# Patient Record
Sex: Female | Born: 1950 | Race: Black or African American | Hispanic: No | Marital: Single | Smoking: Former smoker
Health system: Southern US, Community
[De-identification: ages and names within clinical notes are randomized; demographics above are authoritative.]

## PROBLEM LIST (undated history)

## (undated) DIAGNOSIS — M1990 Unspecified osteoarthritis, unspecified site: Secondary | ICD-10-CM

## (undated) DIAGNOSIS — J449 Chronic obstructive pulmonary disease, unspecified: Secondary | ICD-10-CM

## (undated) DIAGNOSIS — Z5189 Encounter for other specified aftercare: Secondary | ICD-10-CM

## (undated) DIAGNOSIS — D649 Anemia, unspecified: Secondary | ICD-10-CM

## (undated) DIAGNOSIS — IMO0001 Reserved for inherently not codable concepts without codable children: Secondary | ICD-10-CM

## (undated) DIAGNOSIS — C55 Malignant neoplasm of uterus, part unspecified: Secondary | ICD-10-CM

## (undated) DIAGNOSIS — IMO0002 Reserved for concepts with insufficient information to code with codable children: Secondary | ICD-10-CM

## (undated) DIAGNOSIS — K5900 Constipation, unspecified: Secondary | ICD-10-CM

## (undated) HISTORY — DX: Malignant neoplasm of uterus, part unspecified: C55

## (undated) HISTORY — DX: Encounter for other specified aftercare: Z51.89

## (undated) HISTORY — DX: Unspecified osteoarthritis, unspecified site: M19.90

## (undated) HISTORY — DX: Chronic obstructive pulmonary disease, unspecified: J44.9

## (undated) HISTORY — PX: COLONOSCOPY: SHX174

---
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
## 2003-02-20 ENCOUNTER — Emergency Department (HOSPITAL_COMMUNITY): Admission: EM | Admit: 2003-02-20 | Discharge: 2003-02-20 | Payer: Self-pay | Admitting: Emergency Medicine

## 2003-11-23 ENCOUNTER — Emergency Department (HOSPITAL_COMMUNITY): Admission: EM | Admit: 2003-11-23 | Discharge: 2003-11-23 | Payer: Self-pay | Admitting: Emergency Medicine

## 2007-03-19 ENCOUNTER — Emergency Department (HOSPITAL_COMMUNITY): Admission: EM | Admit: 2007-03-19 | Discharge: 2007-03-19 | Payer: Self-pay | Admitting: *Deleted

## 2007-03-25 ENCOUNTER — Encounter: Admission: RE | Admit: 2007-03-25 | Discharge: 2007-03-25 | Payer: Self-pay | Admitting: Internal Medicine

## 2009-01-20 ENCOUNTER — Encounter: Admission: RE | Admit: 2009-01-20 | Discharge: 2009-01-20 | Payer: Self-pay | Admitting: Internal Medicine

## 2009-03-31 ENCOUNTER — Encounter: Admission: RE | Admit: 2009-03-31 | Discharge: 2009-03-31 | Payer: Self-pay | Admitting: Internal Medicine

## 2010-07-08 ENCOUNTER — Encounter: Payer: Self-pay | Admitting: Internal Medicine

## 2011-03-28 LAB — URINALYSIS, ROUTINE W REFLEX MICROSCOPIC
Glucose, UA: NEGATIVE
Protein, ur: 30 — AB
pH: 6

## 2011-03-28 LAB — URINE MICROSCOPIC-ADD ON

## 2011-03-28 LAB — WET PREP, GENITAL

## 2011-03-28 LAB — GC/CHLAMYDIA PROBE AMP, GENITAL: Chlamydia, DNA Probe: NEGATIVE

## 2012-02-19 ENCOUNTER — Ambulatory Visit: Payer: Self-pay | Admitting: Emergency Medicine

## 2012-02-19 ENCOUNTER — Ambulatory Visit: Payer: Self-pay

## 2012-02-19 VITALS — BP 100/72 | HR 65 | Temp 98.3°F | Resp 18 | Ht 66.5 in | Wt 135.6 lb

## 2012-02-19 DIAGNOSIS — R05 Cough: Secondary | ICD-10-CM

## 2012-02-19 DIAGNOSIS — R509 Fever, unspecified: Secondary | ICD-10-CM

## 2012-02-19 DIAGNOSIS — J449 Chronic obstructive pulmonary disease, unspecified: Secondary | ICD-10-CM | POA: Insufficient documentation

## 2012-02-19 DIAGNOSIS — D7589 Other specified diseases of blood and blood-forming organs: Secondary | ICD-10-CM

## 2012-02-19 LAB — POCT CBC
Granulocyte percent: 41.7 %G (ref 37–80)
Lymph, poc: 1.8 (ref 0.6–3.4)
MCH, POC: 32 pg — AB (ref 27–31.2)
MCHC: 30.9 g/dL — AB (ref 31.8–35.4)
MID (cbc): 0.4 (ref 0–0.9)
RBC: 4.31 M/uL (ref 4.04–5.48)
RDW, POC: 12.9 %

## 2012-02-19 MED ORDER — DOXYCYCLINE HYCLATE 100 MG PO CAPS: 100.0000 mg | ORAL_CAPSULE | Freq: Two times a day (BID) | ORAL | Status: AC

## 2012-02-19 MED ORDER — BENZONATATE 100 MG PO CAPS: 100.0000 mg | ORAL_CAPSULE | Freq: Three times a day (TID) | ORAL | Status: AC | PRN

## 2012-02-19 MED ORDER — ALBUTEROL SULFATE HFA 108 (90 BASE) MCG/ACT IN AERS: 2.0000 | INHALATION_SPRAY | RESPIRATORY_TRACT | Status: DC | PRN

## 2012-02-19 NOTE — Progress Notes (Signed)
Subjective:    Patient ID: Sheena Caldwell, female    DOB: [birth year], 61 y.o.   MRN: [id number]  HPI This 61 y.o. female presents for evaluation of illness x 4 days.  Began with chest pain with bending.  Applied a linament with relief.  Now has a "hacking cough," fatigue, subjective fever, and feeling weak.  Cough is occasionally productive of a small amount of clear sputum.  Some morning nasal congestion, no sore throat.  No GI symptoms, GU symptoms. She is a smoker.    Review of Systems As above.   History reviewed. No pertinent past medical history.  Past Surgical History  Procedure Date  . Cesarean section   . Incise and drain abcess 1970    scalp    Prior to Admission medications   Not on File    No Known Allergies  History   Social History  . Marital Status: Single    Spouse Name: n/a    Number of Children: 2  . Years of Education: 13   Occupational History  . customer service representative     [employer]   Social History Main Topics  . Smoking status: Current Everyday Smoker -- 1.0 packs/day    Types: Cigarettes  . Smokeless tobacco: Never Used  . Alcohol Use: Yes     occasional wine  . Drug Use: No  . Sexually Active: Not Currently -- Female partner(s)    Birth Control/ Protection: Post-menopausal   Other Topics Concern  . Not on file   Social History Narrative   Daughter graduated from college 2013, plans to go to Social worker school, lives in [place].  Son lives in [place].    Family History  Problem Relation Age of Onset  . Hypertension Mother   . Cancer Father        Objective:   Physical Exam  Blood pressure 100/72, pulse 65, temperature 98.3 F (36.8 C), temperature source Oral, resp. rate 18, height 5' 6.5" (1.689 m), weight 135 lb 9.6 oz (61.508 kg), SpO2 98.00%. Body mass index is 21.56 kg/(m^2). Well-developed, well nourished BF who is awake, alert and oriented, in NAD. HEENT: [place]/AT, PERRL, EOMI.  Sclera and conjunctiva are  clear.  EAC are patent, TMs are normal in appearance. Nasal mucosa is pink and moist. OP is clear. Upper edentula is compensated.  She has a few remaining lower teeth. Neck: supple, non-tender, no lymphadenopathy, thyromegaly. Heart: RRR, no murmur Lungs: normal effort, CTA Skin: warm and dry without rash.  CXR: [facility] reading (PRIMARY) by  Dr. Cleta Alberts. Changes consistent with COPD, old granulomatous disease.  No pneumothorax.  No infiltrates.  No masses.  Results for orders placed in visit on 02/19/12  POCT CBC      Component Value Range   WBC 3.7 (*) 4.6 - 10.2 K/uL   Lymph, poc 1.8  0.6 - 3.4   POC LYMPH PERCENT 48.3  10 - 50 %L   MID (cbc) 0.4  0 - 0.9   POC MID % 10.0  0 - 12 %M   POC Granulocyte 1.5 (*) 2 - 6.9   Granulocyte percent 41.7  37 - 80 %G   RBC 4.31  4.04 - 5.48 M/uL   Hemoglobin 13.8  12.2 - 16.2 g/dL   HCT, POC 40.9  81.1 - 47.9 %   MCV 103.6 (*) 80 - 97 fL   MCH, POC 32.0 (*) 27 - 31.2 pg   MCHC 30.9 (*) 31.8 - 35.4 g/dL  RDW, POC 12.9     Platelet Count, POC 266  142 - 424 K/uL   MPV 7.6  0 - 99.8 fL       Assessment & Plan:   1. Cough  DG Chest 2 View, doxycycline (VIBRAMYCIN) 100 MG capsule, benzonatate (TESSALON) 100 MG capsule  2. Fever  POCT CBC  3. COPD (chronic obstructive pulmonary disease)  albuterol (PROVENTIL HFA;VENTOLIN HFA) 108 (90 BASE) MCG/ACT inhaler  4. Macrocytosis without anemia     Encouraged smoking cessation.

## 2012-02-19 NOTE — Patient Instructions (Signed)
Please consider quitting smoking.  Your lungs can improve if you quit, and you can stop the progression of COPD.

## 2014-04-06 HISTORY — PX: TOTAL ABDOMINAL HYSTERECTOMY W/ BILATERAL SALPINGOOPHORECTOMY: SHX83

## 2014-04-26 HISTORY — PX: PORTACATH PLACEMENT: SHX2246

## 2014-08-04 ENCOUNTER — Other Ambulatory Visit: Payer: Self-pay | Admitting: *Deleted

## 2014-08-04 DIAGNOSIS — C55 Malignant neoplasm of uterus, part unspecified: Secondary | ICD-10-CM

## 2014-08-05 ENCOUNTER — Ambulatory Visit: Payer: Medicaid Other | Attending: Gynecologic Oncology | Admitting: Gynecologic Oncology

## 2014-08-05 ENCOUNTER — Encounter: Payer: Self-pay | Admitting: Gynecologic Oncology

## 2014-08-05 VITALS — BP 99/72 | HR 90 | Temp 98.3°F | Resp 20 | Ht 66.5 in | Wt 130.4 lb

## 2014-08-05 DIAGNOSIS — Z90710 Acquired absence of both cervix and uterus: Secondary | ICD-10-CM | POA: Insufficient documentation

## 2014-08-05 DIAGNOSIS — C541 Malignant neoplasm of endometrium: Secondary | ICD-10-CM | POA: Diagnosis present

## 2014-08-05 NOTE — Patient Instructions (Signed)
Scheduling will call you with you appointment with Dr. Marko Plume

## 2014-08-08 ENCOUNTER — Other Ambulatory Visit: Payer: Self-pay | Admitting: Oncology

## 2014-08-08 ENCOUNTER — Telehealth: Payer: Self-pay | Admitting: Oncology

## 2014-08-08 ENCOUNTER — Telehealth: Payer: Self-pay | Admitting: *Deleted

## 2014-08-08 DIAGNOSIS — C541 Malignant neoplasm of endometrium: Secondary | ICD-10-CM

## 2014-08-08 NOTE — Telephone Encounter (Signed)
Call received from patient requesting Dr. Mariana Kaufman nurse.  Asked what I cold do to help but "I want to talk to [facility]".  Call transferred to collaborative.  Voicemail responed.

## 2014-08-08 NOTE — Telephone Encounter (Signed)
Pt is aware of np appt.. 08/09/14@9 :30

## 2014-08-09 ENCOUNTER — Other Ambulatory Visit (HOSPITAL_BASED_OUTPATIENT_CLINIC_OR_DEPARTMENT_OTHER): Payer: Medicaid Other

## 2014-08-09 ENCOUNTER — Encounter: Payer: Self-pay | Admitting: Oncology

## 2014-08-09 ENCOUNTER — Encounter: Payer: Self-pay | Admitting: Gynecologic Oncology

## 2014-08-09 ENCOUNTER — Telehealth: Payer: Self-pay

## 2014-08-09 ENCOUNTER — Ambulatory Visit: Payer: Medicaid Other

## 2014-08-09 ENCOUNTER — Ambulatory Visit (HOSPITAL_BASED_OUTPATIENT_CLINIC_OR_DEPARTMENT_OTHER): Payer: Medicaid Other | Admitting: Oncology

## 2014-08-09 ENCOUNTER — Telehealth: Payer: Self-pay | Admitting: Oncology

## 2014-08-09 VITALS — BP 92/62 | HR 94 | Temp 99.2°F | Resp 19 | Ht 66.0 in | Wt 138.6 lb

## 2014-08-09 DIAGNOSIS — C541 Malignant neoplasm of endometrium: Secondary | ICD-10-CM

## 2014-08-09 DIAGNOSIS — T451X5A Adverse effect of antineoplastic and immunosuppressive drugs, initial encounter: Secondary | ICD-10-CM

## 2014-08-09 DIAGNOSIS — H6692 Otitis media, unspecified, left ear: Secondary | ICD-10-CM

## 2014-08-09 DIAGNOSIS — C775 Secondary and unspecified malignant neoplasm of intrapelvic lymph nodes: Secondary | ICD-10-CM

## 2014-08-09 DIAGNOSIS — K209 Esophagitis, unspecified without bleeding: Secondary | ICD-10-CM | POA: Insufficient documentation

## 2014-08-09 DIAGNOSIS — D701 Agranulocytosis secondary to cancer chemotherapy: Secondary | ICD-10-CM | POA: Insufficient documentation

## 2014-08-09 DIAGNOSIS — R112 Nausea with vomiting, unspecified: Secondary | ICD-10-CM | POA: Insufficient documentation

## 2014-08-09 DIAGNOSIS — S025XXA Fracture of tooth (traumatic), initial encounter for closed fracture: Secondary | ICD-10-CM | POA: Insufficient documentation

## 2014-08-09 DIAGNOSIS — Z720 Tobacco use: Secondary | ICD-10-CM

## 2014-08-09 DIAGNOSIS — Z95828 Presence of other vascular implants and grafts: Secondary | ICD-10-CM | POA: Insufficient documentation

## 2014-08-09 DIAGNOSIS — K208 Other esophagitis: Secondary | ICD-10-CM

## 2014-08-09 DIAGNOSIS — K089 Disorder of teeth and supporting structures, unspecified: Secondary | ICD-10-CM

## 2014-08-09 LAB — COMPREHENSIVE METABOLIC PANEL (CC13)
ALBUMIN: 3.2 g/dL — AB (ref 3.5–5.0)
ALT: 48 U/L (ref 0–55)
AST: 43 U/L — AB (ref 5–34)
Alkaline Phosphatase: 152 U/L — ABNORMAL HIGH (ref 40–150)
Anion Gap: 7 mEq/L (ref 3–11)
BUN: 6.4 mg/dL — AB (ref 7.0–26.0)
CALCIUM: 9.3 mg/dL (ref 8.4–10.4)
CHLORIDE: 103 meq/L (ref 98–109)
CO2: 29 mEq/L (ref 22–29)
Creatinine: 0.8 mg/dL (ref 0.6–1.1)
Glucose: 162 mg/dl — ABNORMAL HIGH (ref 70–140)
POTASSIUM: 3.9 meq/L (ref 3.5–5.1)
SODIUM: 138 meq/L (ref 136–145)
TOTAL PROTEIN: 7.6 g/dL (ref 6.4–8.3)
Total Bilirubin: 0.27 mg/dL (ref 0.20–1.20)

## 2014-08-09 LAB — CBC WITH DIFFERENTIAL/PLATELET
BASO%: 0.1 % (ref 0.0–2.0)
Basophils Absolute: 0 10*3/uL (ref 0.0–0.1)
EOS%: 0.1 % (ref 0.0–7.0)
Eosinophils Absolute: 0 10*3/uL (ref 0.0–0.5)
HCT: 34.7 % — ABNORMAL LOW (ref 34.8–46.6)
HGB: 11.6 g/dL (ref 11.6–15.9)
LYMPH#: 1.3 10*3/uL (ref 0.9–3.3)
LYMPH%: 7.9 % — ABNORMAL LOW (ref 14.0–49.7)
MCH: 34.5 pg — ABNORMAL HIGH (ref 25.1–34.0)
MCHC: 33.4 g/dL (ref 31.5–36.0)
MCV: 103.3 fL — ABNORMAL HIGH (ref 79.5–101.0)
MONO#: 1.2 10*3/uL — AB (ref 0.1–0.9)
MONO%: 7.2 % (ref 0.0–14.0)
NEUT#: 13.6 10*3/uL — ABNORMAL HIGH (ref 1.5–6.5)
NEUT%: 84.7 % — ABNORMAL HIGH (ref 38.4–76.8)
Platelets: 139 10*3/uL — ABNORMAL LOW (ref 145–400)
RBC: 3.36 10*6/uL — AB (ref 3.70–5.45)
RDW: 15 % — ABNORMAL HIGH (ref 11.2–14.5)
WBC: 16 10*3/uL — AB (ref 3.9–10.3)

## 2014-08-09 MED ORDER — AZITHROMYCIN 250 MG PO TABS: ORAL_TABLET | ORAL | Status: DC

## 2014-08-09 NOTE — Telephone Encounter (Signed)
per pof to sch pt appt-gave pt copy of sch °

## 2014-08-09 NOTE — Progress Notes (Signed)
Consult Note: Gyn-Onc  Consult was requested by Dr. Jacquelyne Balint for the evaluation of Sheena Caldwell 64 y.o. female with stage IIIC1 serous endometrial cancer.  CC:  Chief Complaint  Patient presents with  . New Patient  . Endometrial Cancer    Assessment/Plan:  Sheena Caldwell  is a 64 y.o.  year old with stage IIIc 1 endometrial serous carcinoma. She has completed 3 of a planned 6 cycles of adjuvant carboplatin and paclitaxel. This has been complicated by grade 3 neutropenia which required dose delay and administration of Neulasta. She is on day 7 of cycle 3 today.  1/ referral to Dr. Marko Plume for resumption of an additional 3 cycles of carboplatin and paclitaxel with Neulasta prophylaxis 2/ baseline imaging to be performed after completion of a total of 6 cycles of carboplatin and paclitaxel (after 3 additional doses). 3/ consideration for adjuvant vaginal brachial therapy given her 9 cm serous endometrial tumor with LV SI present in the uterine specimen. This would be only beneficial for oral control at the vaginal cuff and would confer no survival benefit.   HPI: Sheena Caldwell is a 64 year old woman with a history of endometrial cancer. On 04/06/2014 she underwent a total abdominal hysterectomy BSO omentectomy lymph node dissection by Dr. Jacquelyne Balint in [place]. Final pathology revealed a uterine serous carcinoma which was 9 cm in greatest dimension, with Full myometrial invasion, positive L VSI, positive peritoneal cytology, positive pelvic lymph nodes (2 of 5 on the right, and 2 of 5 on the left. The omentum was negative for metastatic carcinoma.  Lymph nodes were not sampled. Following surgery she was dispositioned to receive 6 cycles of adjuvant chemotherapy with carboplatin AUC 6 and paclitaxel on paclitaxel 175 mg/m. She relocated to [facility] last week and desires to continue her current treatment regimen here with [place].  Overall she has been tolerating therapy fairly well with  the exception of developing severe neutropenia after cycle 2 which required dose delays for 2 weeks and had menstruation of Neulasta at cycle 3.  Today she is day 7 of cycle 3.   Current Meds:  Outpatient Encounter Prescriptions as of 08/05/2014  Medication Sig  . clonazePAM (KLONOPIN) 1 MG tablet Take 1 mg by mouth 2 (two) times daily as needed for anxiety (for restless legs).  Marland Kitchen HYDROcodone-acetaminophen (NORCO/VICODIN) 5-325 MG per tablet Take 1 tablet by mouth every 4 (four) hours as needed for moderate pain.  Marland Kitchen ondansetron (ZOFRAN) 8 MG tablet Take 8 mg by mouth every 8 (eight) hours as needed for nausea or vomiting.  . promethazine (PHENERGAN) 12.5 MG tablet Take 12.5 mg by mouth every 6 (six) hours as needed for nausea or vomiting.  . naproxen sodium (ANAPROX) 220 MG tablet Take 220 mg by mouth 2 (two) times daily with a meal. Pt takes med as needed  . [DISCONTINUED] albuterol (PROVENTIL HFA;VENTOLIN HFA) 108 (90 BASE) MCG/ACT inhaler Inhale 2 puffs into the lungs every 4 (four) hours as needed for wheezing (cough, shortness of breath or wheezing.).    Allergy: No Known Allergies  Social Hx:   History   Social History  . Marital Status: Single    Spouse Name: n/a  . Number of Children: 2  . Years of Education: 13   Occupational History  . customer service representative     [employer]   Social History Main Topics  . Smoking status: Former Smoker -- 1.00 packs/day for 30 years    Types: Cigarettes    Quit date:  07/16/2013  . Smokeless tobacco: Never Used  . Alcohol Use: 1.2 - 1.8 oz/week    2-3 Glasses of wine per week     Comment: occasional wine  . Drug Use: Yes    Special: Marijuana     Comment: 7 to 9 joints each week "to relax"  . Sexual Activity:    Partners: Male    Birth Control/ Protection: Post-menopausal   Other Topics Concern  . Not on file   Social History Narrative   Daughter graduated from college 2013, plans to go to Sports coach school, lives in  [place].  Son lives in [place].    Past Surgical Hx:  Past Surgical History  Procedure Laterality Date  . Cesarean section    . Incise and drain abcess  1970    scalp    Past Medical Hx:  Past Medical History  Diagnosis Date  . Uterine cancer     Past Gynecological History:  SVD x 2  No LMP recorded. Patient is postmenopausal.  Family Hx:  Family History  Problem Relation Age of Onset  . Hypertension Mother   . Cancer Father     Review of Systems:  Constitutional  Feels well,    ENT Normal appearing ears and nares bilaterally Skin/Breast  No rash, sores, jaundice, itching, dryness Cardiovascular  No chest pain, shortness of breath, or edema  Pulmonary  No cough or wheeze.  Gastro Intestinal  No nausea, vomitting, or diarrhoea. No bright red blood per rectum, no abdominal pain, change in bowel movement, or constipation.  Genito Urinary  No frequency, urgency, dysuria,  Musculo Skeletal  No myalgia, arthralgia, joint swelling or pain  Neurologic  No weakness, numbness, change in gait,  Psychology  No depression, anxiety, insomnia.   Vitals:  Blood pressure 99/72, pulse 90, temperature 98.3 F (36.8 C), temperature source Oral, resp. rate 20, height 5' 6.5" (1.689 m), weight 130 lb 6.4 oz (59.149 kg).  Physical Exam: WD in NAD Neck  Supple NROM, without any enlargements.  Lymph Node Survey No cervical supraclavicular or inguinal adenopathy Cardiovascular  Pulse normal rate, regularity and rhythm. S1 and S2 normal.  Lungs  Clear to auscultation bilateraly, without wheezes/crackles/rhonchi. Good air movement.  Skin  No rash/lesions/breakdown  Psychiatry  Alert and oriented to person, place, and time  Abdomen  Normoactive bowel sounds, abdomen soft, non-tender and thin without evidence of hernia.  Back No CVA tenderness Genito Urinary: deferred Rectal  Good tone, no masses no cul de sac nodularity.  Extremities  No bilateral cyanosis,  clubbing or edema.   Donaciano Eva, MD   08/05/2014, 5:09 PM

## 2014-08-09 NOTE — Progress Notes (Signed)
[facility] NEW PATIENT EVALUATION   Name: Sheena Caldwell Date: August 09, 2014  MRN: [id number] DOB: 1950/06/22  REFERRING PHYSICIAN: Everitt Amber CC Brigitte Clelia Croft Hedgecock(PCP, [facility] [employer]), _ [name] (gyn [place])  REASON FOR REFERRAL:    HISTORY OF PRESENT ILLNESS:Sheena Caldwell is a 64 y.o. female who is seen in consultation,alone for visit, at the request of Drs Jacquelyne Balint and Denman George, as she requests transfer of gyn oncology care and active chemotherapy to [facility]. History is from outside records, this EMR and patient.  Patient had been menopausal since age 27, then had one episode of vaginal bleeding early 2015. Later in 2015 she had persistent vaginal discharge, for which she was seen by PCP Brand Males (APP with [place] at [place] [facility]) in ~ 02-2014, had CTs in [facility] (reports to be requested) and was referred to Dr Adella Nissen, gyn in [facility]. Endometrial biopsy had concern (that path not included in information available now) such that she was referred to Dr Jacquelyne Balint. Surgery by Dr Sabra Heck at [facility] in [place] on 04-06-14 was TAH, BSO, omentectomy, pelvic and right common iliac node evaluation; patient was hospitalized x 3 days and tells me that she recovered well from the surgery. Pathology [phone]) from 04-06-14 found high grade serous carcinoma involving entire thickness of myometrium (depth of invasion 1.1 cm out of 1.1 cm), with invasion of cervical stroma, no involvement of uterine serosa/bilateral tubes and ovaries/omentum, positive LVSI, 2/5 right pelvic nodes, 2 of 4 left pelvic nodes and 0/1 right common iliac node. Bulk of the carcinoma was in lower uterine segment where it was within 0.1 cm of serosal surface. Cytology positive for adenocarcinoma on washings ([id number]). Surgical findings were significant for no paraaortic adenopathy  apparent. Patient has received 3 cycles of adjuvant chemotherapy in [facility] by Dr Sabra Heck, on 05-05-14, 05-26-14 and 07-29-14. Per records and patient, delay of cycle 3 was related to low counts and insurance changes. She received neulasta after chemotherapy on 07-29-14. Taxol was given at 175 mg/m2 for total dose was 280 mg  cycle 1 and 291 mg for cycles 2 and 3; carboplatin was AUC = 6 with total dose 610 mg cycle 1, 620 mg cycle 2 and 630 mg cycle 3.. She does not recall using oral decadron premed for taxol, with premeds listed on chemo flowsheets standard decadron 20 mg, zofran 16 mg, benadryl 50 mg (which caused severe restless legs) and pepcid 20 mg. Outside information does not include serial blood counts. Last note from Dr Sabra Heck dated 07-29-14 describes unremarkable exam, "NED during chemotherapy and adequate PS". Patient was see by Dr Denman George 08-09-14, who recommends restaging scans after 6 cycles of chemotherapy and consideration of vaginal brachytherapy; she has not been seen by radiation oncology in [place].  Patient tells me that she has done generally well with chemo, particularly since she started antiemetics (Zofran best) immediately after chemo x several days. She has not used promethazine. She denies aching from taxol or neulasta, which she recalls was given 2 days after most recent treatment. She is usually tired for ~ 3 days after treatment. She has some intermittent tingling in soles of feet bilaterally since most recent treatment, none in hands. She has PAC, which has functioned without difficulty. Bowels move regularly, has prn stool softener and miralax. She has generally been able to eat well and to drink fluids well, with 24 hour diet  review very good (green tea, soda, gatorade, OJ, mac & cheese, chicken, collards, stew beef) "appetite good with marijuana".  For last several days she has had pain in high left throat and some discomfort left submandibular area and left ear. She has not had  known fever, tho temp here is 99.2. She has no lower respiratory symptoms and denies dental symptoms on left. The throat pain is not worsening, but is not improving. She has not tried any medications.   REVIEW OF SYSTEMS as above, also: Occasional tensiion HA , no other neurologic symptoms other than peripheral neuropathy in feet as above. Wears contacts. No difficulty hearing. 2 broken lower front teeth, no dentist, no pain. No thyroid issues known. No SOB with walking, no cough or chest pain. Arthritis knee. No swelling LE. No abdominal or pelvic pain. No GERD. No bleeding. Sleeps poorly, not new.   Remainder of full 10 point review of systems negative.   ALLERGIES: Review of patient's allergies indicates no known allergies.  PAST MEDICAL/ SURGICAL HISTORY:    Bronchitis/ COPD ~ 2014 G2P2, C section Menarche 14/ menopause 52 [place] 03-2009 Drained abscess on top of head age 89 PAC in [place] BSO, omentectomy, nodes 04-06-2014   Flu vaccine done    CURRENT MEDICATIONS: reviewed as listed now in EMR. Recommended beginning Claritin for left ear and script for Z pack. Will decrease IV benadryl to 25 mg with chemotherapy, due to restless legs.  PHARMACY [facility]  SOCIAL HISTORY:  Lives alone in [place], daughter in [place] area. Sister, aunts, mother all in [place]. Cigarettes x 20-30 years generally 1 ppd, stopped from 05-03-14 until Jan 2016, and has stopped again. Denies Etoh. Uses THC. Previously worked at [employer], is surprised that she may not be permanently disabled from this cancer. Daughter in [place], son in [place]. Does not have Advance Directives and declined information.  FAMILY HISTORY:  Throat cancer in father, substance abuse history not known Mother active at age 98 7 siblings, no other cancer known            PHYSICAL EXAM:  height is 5' 6"  (1.676 m) and weight is 138 lb 9.6 oz (62.869 kg). Her oral temperature is 99.2 F  (37.3 C). Her blood pressure is 92/62 and her pulse is 94. Her respiration is 19.  Alert, cooperative, fair historian, looks somewhat uncomfortable from the throat symptoms.  HEENT: total alopecia. PERRL, not icteric. Nasal turbinates not boggy. Right TM clear, left TM dull without erythema. Oral mucosa moist, erythema without exudate L>R posterior pharynx. Multiple missing teeth, 2 broken lower front teeth on right. No submandibular adenopathy, with submandibular glands symmetrical bilaterally. Neck supple, no JVD or thyroid mass   RESPIRATORY: respirations not labored RA. BS somewhat diminished thruout without wheezes, rales, crackles. Hyperresonant to percussion.  CARDIAC/ VASCULAR: heart RRR, clear heart sounds, no murmur or gallop. Peripheral pulses intact and symmetriacl  ABDOMEN:soft, not tender, normal bowel sounds, no HSM or mass. Midline incision well healed.  LYMPH NODES: No cervical, supraclavicular, axillary or inguinal adenopathy  BREASTS: Bilaterally without dominant mass, skin or nipple findings of concern  NEUROLOGIC: Minimal peripheral neuropathy soles of feet, hands ok. No other focal deficits. PSYCH appropriate mood and affect  SKIN: without rash, ecchymosis, petechiae  MUSCULOSKELETAL: back not tender. Slender but symmetrical muscle mass  Portacath site unremarkable  LABORATORY DATA:  Results for orders placed or performed in visit on 08/09/14 (from the past 48 hour(s))  CBC with Differential  Status: Abnormal   Collection Time: 08/09/14  9:40 AM  Result Value Ref Range   WBC 16.0 (H) 3.9 - 10.3 10e3/uL   NEUT# 13.6 (H) 1.5 - 6.5 10e3/uL   HGB 11.6 11.6 - 15.9 g/dL   HCT 34.7 (L) 34.8 - 46.6 %   Platelets 139 (L) 145 - 400 10e3/uL   MCV 103.3 (H) 79.5 - 101.0 fL   MCH 34.5 (H) 25.1 - 34.0 pg   MCHC 33.4 31.5 - 36.0 g/dL   RBC 3.36 (L) 3.70 - 5.45 10e6/uL   RDW 15.0 (H) 11.2 - 14.5 %   lymph# 1.3 0.9 - 3.3 10e3/uL   MONO# 1.2 (H) 0.1 - 0.9 10e3/uL    Eosinophils Absolute 0.0 0.0 - 0.5 10e3/uL   Basophils Absolute 0.0 0.0 - 0.1 10e3/uL   NEUT% 84.7 (H) 38.4 - 76.8 %   LYMPH% 7.9 (L) 14.0 - 49.7 %   MONO% 7.2 0.0 - 14.0 %   EOS% 0.1 0.0 - 7.0 %   BASO% 0.1 0.0 - 2.0 %  Comprehensive metabolic panel (Cmet) - [facility]     Status: Abnormal   Collection Time: 08/09/14  9:40 AM  Result Value Ref Range   Sodium 138 136 - 145 mEq/L   Potassium 3.9 3.5 - 5.1 mEq/L   Chloride 103 98 - 109 mEq/L   CO2 29 22 - 29 mEq/L   Glucose 162 (H) 70 - 140 mg/dl   BUN 6.4 (L) 7.0 - 26.0 mg/dL   Creatinine 0.8 0.6 - 1.1 mg/dL   Total Bilirubin 0.27 0.20 - 1.20 mg/dL   Alkaline Phosphatase 152 (H) 40 - 150 U/L   AST 43 (H) 5 - 34 U/L   ALT 48 0 - 55 U/L   Total Protein 7.6 6.4 - 8.3 g/dL   Albumin 3.2 (L) 3.5 - 5.0 g/dL   Calcium 9.3 8.4 - 10.4 mg/dL   Anion Gap 7 3 - 11 mEq/L   EGFR >90 >90 ml/min/1.73 m2    Comment: eGFR is calculated using the CKD-EPI Creatinine Equation (2009)      PATHOLOGY: Outside surgical pathology and cytology as noted, copies sent to be scanned into this EMR  RADIOLOGY Will reguest CT reports from PCP in [facility], as these are not in our records now - would have been ~ 02-2014.     DISCUSSION: History and course to date as above discussed with patient. She understands that total of 6 cycles of taxol and carboplatin are recommended in adjuvant fashion for the endometrial cancer. She would like to complete chemotherapy treatment in [place], cycle 4 due on 08-19-14 if counts adequate and she will need neulasta. Continue same antiemetics, decrease IV benadryl dose due to restless legs, no oral decadron premedication as she has had no problems with allergic reactions to first 3 cycles. Discussed peripheral neuropathy with taxol, which may require adjustments in that drug if persistent and severe to point of interfering with function; she understands that the taxol can be very effective for this type of cancer and that benefit vs  risk has to be weighed as treatment continues. Oral consent obtained.     IMPRESSION / PLAN:  1.IIIC high grade serous endometrial carcinoma involving full thickness of myometrium in lower uterine segment, with involved pelvic nodes and positive washings. Post optimal debulking by Dr Jacquelyne Balint in [place] 04-06-14 and 3 cycles of adjuvant taxol carboplatin from 05-05-14 thru 07-29-13, delay reportedly with low counts and insurance change. Will  treat cycle 4 on 08-19-14 as long as ANC >=1.5 and plt >=100k, with neulasta on ~ day 2. I will see her back ~ 7-10 days after that treatment. I did not discuss radiation oncology consultation with her today, but will follow up with that 2. Acute esophagitis and left otitis: not neutropenic and only 99 temp now. Will begin z pack and claritin. She is aware that she needs to push po fluids. She will see APP in follow up on 08-15-14, to be sure improving adequately prior to planned chemo on 08-19-14. 3.long tobacco recently discontinued. Encouraged her to continue 4.restless legs with benadryl, plan as above 5.overdue mammograms, which we will address when possible 6.PAC in 7.flu vaccine done 8.poor dentition with broken teeth, tho no acute symptoms. She does not have dentist, and I have spoken with [place] re possible referral. Will continue chemo unless acute problems, particularly with delays already in this adjuvant treatment.    Patient had questions answered to her satisfaction and is in agreement with plan above. She can contact this office for questions or concerns at any time prior to next scheduled visit.  Time spent  60 min, including >50% discussion and coordination of care. Chemo and neulasta orders entered. Financial staff notified for preauthorization. Cc this note to Drs Jenny Reichmann, and PCP   Gordy Levan, MD 08/09/2014 12:56 PM

## 2014-08-09 NOTE — Patient Instructions (Signed)
Drink lots of fluids today - try 16 oz every hour this afternoon.  Aleve is fine, take with food We will send prescription for Z pack (azithromycin) to your pharmacy, for the ear and throat. You should also pick up claritin 10 mg one daily and start today, for ear. This claritin can also help with aches after taxol and after the neulasta shot.  You can call if needed prior to next appointment   [phone]

## 2014-08-09 NOTE — Telephone Encounter (Signed)
-----   Message from Gordy Levan, MD sent at 08/09/2014 12:26 PM EST ----- Scripts to [facility]  Z pack Also needs to pick up Claritin  thanks

## 2014-08-09 NOTE — Telephone Encounter (Signed)
Sent Z-Pack prescription To [facility] and requested pt. Receive Claritin as well OTC.

## 2014-08-09 NOTE — Progress Notes (Signed)
Checked in new patient with no issues prior to seeing the dr. She has appt crd and has not traveled.

## 2014-08-11 ENCOUNTER — Ambulatory Visit: Payer: Medicaid Other | Admitting: Oncology

## 2014-08-11 ENCOUNTER — Telehealth: Payer: Self-pay | Admitting: *Deleted

## 2014-08-11 NOTE — Telephone Encounter (Signed)
Received message from Maudie Mercury in [place] that patient called stating she is still feeling bad since seeing Dr. Marko Plume on 08/09/14. Returned call to patient to get more information - pt states that she has started the Z-pak but did not get any Claritin to take. She states that her sore throat is better but that she just doesn't have an appetite nor is able to drink much because she is worried she will get sick. Patient denies any nausea or vomiting. Asked patient what she has had to eat or drink today - she states she has had one bottle of Gatorade but nothing to eat yet. Asked patient if she is running a fever - patient states she does not have a thermometer but has been sweating. She states her cousin is going to bring her a thermometer later today. Told patient I will pass this information along to Dr. Marko Plume and call her back.  Per Dr. Marko Plume patient needs to check her temperature and call us back if it is higher than 100.5 F. Patient needs to go ahead and take nausea medication now and PUSH PO fluids and eat small light meals or try supplement drinks like Ensure or Boost. Per Dr. Marko Plume patient needs to try Claritin as well. Called patient back and let her know this - she is agreeable to try and increase PO fluids and increasing her food intake. Told patient RN will call her tomorrow and checkin on her status.

## 2014-08-12 ENCOUNTER — Telehealth: Payer: Self-pay

## 2014-08-12 ENCOUNTER — Telehealth: Payer: Self-pay | Admitting: Nurse Practitioner

## 2014-08-12 ENCOUNTER — Ambulatory Visit (HOSPITAL_BASED_OUTPATIENT_CLINIC_OR_DEPARTMENT_OTHER): Payer: Medicaid Other | Admitting: Nurse Practitioner

## 2014-08-12 ENCOUNTER — Ambulatory Visit (HOSPITAL_BASED_OUTPATIENT_CLINIC_OR_DEPARTMENT_OTHER): Payer: Medicaid Other

## 2014-08-12 ENCOUNTER — Other Ambulatory Visit: Payer: Self-pay | Admitting: Oncology

## 2014-08-12 ENCOUNTER — Other Ambulatory Visit: Payer: Self-pay

## 2014-08-12 VITALS — BP 83/55 | HR 90 | Resp 20

## 2014-08-12 VITALS — BP 94/53 | HR 98 | Temp 99.7°F | Resp 32 | Wt 136.7 lb

## 2014-08-12 DIAGNOSIS — R7401 Elevation of levels of liver transaminase levels: Secondary | ICD-10-CM

## 2014-08-12 DIAGNOSIS — C541 Malignant neoplasm of endometrium: Secondary | ICD-10-CM

## 2014-08-12 DIAGNOSIS — R740 Nonspecific elevation of levels of transaminase and lactic acid dehydrogenase [LDH]: Secondary | ICD-10-CM

## 2014-08-12 DIAGNOSIS — R531 Weakness: Secondary | ICD-10-CM

## 2014-08-12 DIAGNOSIS — E8809 Other disorders of plasma-protein metabolism, not elsewhere classified: Secondary | ICD-10-CM

## 2014-08-12 DIAGNOSIS — E860 Dehydration: Secondary | ICD-10-CM

## 2014-08-12 DIAGNOSIS — K5900 Constipation, unspecified: Secondary | ICD-10-CM

## 2014-08-12 DIAGNOSIS — R509 Fever, unspecified: Secondary | ICD-10-CM

## 2014-08-12 DIAGNOSIS — R630 Anorexia: Secondary | ICD-10-CM

## 2014-08-12 DIAGNOSIS — R0600 Dyspnea, unspecified: Secondary | ICD-10-CM

## 2014-08-12 LAB — COMPREHENSIVE METABOLIC PANEL (CC13)
ALBUMIN: 2.6 g/dL — AB (ref 3.5–5.0)
ALK PHOS: 219 U/L — AB (ref 40–150)
ALT: 179 U/L — AB (ref 0–55)
AST: 119 U/L — AB (ref 5–34)
Anion Gap: 11 mEq/L (ref 3–11)
BILIRUBIN TOTAL: 2.38 mg/dL — AB (ref 0.20–1.20)
BUN: 11.7 mg/dL (ref 7.0–26.0)
CALCIUM: 9.8 mg/dL (ref 8.4–10.4)
CO2: 27 mEq/L (ref 22–29)
CREATININE: 0.8 mg/dL (ref 0.6–1.1)
Chloride: 100 mEq/L (ref 98–109)
EGFR: >90 mL/min/{1.73_m2} (ref >90)
Glucose: 150 mg/dl — ABNORMAL HIGH (ref 70–140)
POTASSIUM: 4.1 meq/L (ref 3.5–5.1)
Sodium: 137 mEq/L (ref 136–145)
TOTAL PROTEIN: 7.7 g/dL (ref 6.4–8.3)

## 2014-08-12 LAB — CBC WITH DIFFERENTIAL/PLATELET
BASO%: 0.1 % (ref 0.0–2.0)
BASOS ABS: 0 10*3/uL (ref 0.0–0.1)
EOS%: 0 % (ref 0.0–7.0)
Eosinophils Absolute: 0 10*3/uL (ref 0.0–0.5)
HEMATOCRIT: 34.5 % — AB (ref 34.8–46.6)
HEMOGLOBIN: 10.7 g/dL — AB (ref 11.6–15.9)
LYMPH#: 0.8 10*3/uL — AB (ref 0.9–3.3)
LYMPH%: 4.8 % — AB (ref 14.0–49.7)
MCH: 35.2 pg — ABNORMAL HIGH (ref 25.1–34.0)
MCHC: 31 g/dL — ABNORMAL LOW (ref 31.5–36.0)
MCV: 113.5 fL — ABNORMAL HIGH (ref 79.5–101.0)
MONO#: 0.8 10*3/uL (ref 0.1–0.9)
MONO%: 4.9 % (ref 0.0–14.0)
NEUT%: 90.2 % — AB (ref 38.4–76.8)
NEUTROS ABS: 14.7 10*3/uL — AB (ref 1.5–6.5)
Platelets: 113 10*3/uL — ABNORMAL LOW (ref 145–400)
RBC: 3.04 10*6/uL — ABNORMAL LOW (ref 3.70–5.45)
RDW: 14.4 % (ref 11.2–14.5)
WBC: 16.3 10*3/uL — AB (ref 3.9–10.3)
nRBC: 0 % (ref 0–0)

## 2014-08-12 MED ORDER — HEPARIN SOD (PORK) LOCK FLUSH 100 UNIT/ML IV SOLN
500.0000 [IU] | Freq: Once | INTRAVENOUS | Status: AC
  Administered 2014-08-12: 500 [IU] via INTRAVENOUS
  Filled 2014-08-12: qty 5

## 2014-08-12 MED ORDER — SODIUM CHLORIDE 0.9 % IV SOLN
INTRAVENOUS | Status: AC
  Administered 2014-08-12: 15:00:00 via INTRAVENOUS

## 2014-08-12 MED ORDER — SODIUM CHLORIDE 0.9 % IJ SOLN
10.0000 mL | INTRAMUSCULAR | Status: DC | PRN
  Administered 2014-08-12: 10 mL via INTRAVENOUS
  Filled 2014-08-12: qty 10

## 2014-08-12 MED ORDER — ONDANSETRON 8 MG/NS 50 ML IVPB
INTRAVENOUS | Status: AC
  Filled 2014-08-12: qty 8

## 2014-08-12 MED ORDER — LEVOFLOXACIN 500 MG PO TABS: 500.0000 mg | ORAL_TABLET | Freq: Every day | ORAL | Status: DC

## 2014-08-12 MED ORDER — ONDANSETRON 8 MG/50ML IVPB (CHCC)
8.0000 mg | Freq: Once | INTRAVENOUS | Status: AC
  Administered 2014-08-12: 8 mg via INTRAVENOUS

## 2014-08-12 NOTE — Telephone Encounter (Signed)
Sheena Caldwell stateds that her throat is not as sore.  She did start claritin as instructed. She still feels weak, not eating or drinking much. Sheena Caldwell could not state an approximate amount of fluid she has consumed in last 24 hrs.  She states that she is SOB at rest and with exertion. Onset of sob has been since Neulasta injection ~ 07-30-14.  Temp today is 97.7.  Yesterday it was 99.9. Denies n/v. Sheena Caldwell agreed to come in and see Sheena Caldwell this afternoon to be evaluated.Ms. Tuel is to be at [facility] for lab/visit.  Patient verbalized understanding.

## 2014-08-12 NOTE — Patient Instructions (Signed)
Dehydration, Adult Dehydration is when you lose more fluids from the body than you take in. Vital organs like the kidneys, brain, and heart cannot function without a proper amount of fluids and salt. Any loss of fluids from the body can cause dehydration.  CAUSES   Vomiting.  Diarrhea.  Excessive sweating.  Excessive urine output.  Fever. SYMPTOMS  Mild dehydration  Thirst.  Dry lips.  Slightly dry mouth. Moderate dehydration  Very dry mouth.  Sunken eyes.  Skin does not bounce back quickly when lightly pinched and released.  Dark urine and decreased urine production.  Decreased tear production.  Headache. Severe dehydration  Very dry mouth.  Extreme thirst.  Rapid, weak pulse (more than 100 beats per minute at rest).  Cold hands and feet.  Not able to sweat in spite of heat and temperature.  Rapid breathing.  Blue lips.  Confusion and lethargy.  Difficulty being awakened.  Minimal urine production.  No tears. DIAGNOSIS  Your caregiver will diagnose dehydration based on your symptoms and your exam. Blood and urine tests will help confirm the diagnosis. The diagnostic evaluation should also identify the cause of dehydration. TREATMENT  Treatment of mild or moderate dehydration can often be done at home by increasing the amount of fluids that you drink. It is best to drink small amounts of fluid more often. Drinking too much at one time can make vomiting worse. Refer to the home care instructions below. Severe dehydration needs to be treated at the hospital where you will probably be given intravenous (IV) fluids that contain water and electrolytes. HOME CARE INSTRUCTIONS   Ask your caregiver about specific rehydration instructions.  Drink enough fluids to keep your urine clear or pale yellow.  Drink small amounts frequently if you have nausea and vomiting.  Eat as you normally do.  Avoid:  Foods or drinks high in sugar.  Carbonated  drinks.  Juice.  Extremely hot or cold fluids.  Drinks with caffeine.  Fatty, greasy foods.  Alcohol.  Tobacco.  Overeating.  Gelatin desserts.  Wash your hands well to avoid spreading bacteria and viruses.  Only take over-the-counter or prescription medicines for pain, discomfort, or fever as directed by your caregiver.  Ask your caregiver if you should continue all prescribed and over-the-counter medicines.  Keep all follow-up appointments with your caregiver. SEEK MEDICAL CARE IF:  You have abdominal pain and it increases or stays in one area (localizes).  You have a rash, stiff neck, or severe headache.  You are irritable, sleepy, or difficult to awaken.  You are weak, dizzy, or extremely thirsty. SEEK IMMEDIATE MEDICAL CARE IF:   You are unable to keep fluids down or you get worse despite treatment.  You have frequent episodes of vomiting or diarrhea.  You have blood or green matter (bile) in your vomit.  You have blood in your stool or your stool looks black and tarry.  You have not urinated in 6 to 8 hours, or you have only urinated a small amount of very dark urine.  You have a fever.  You faint. MAKE SURE YOU:   Understand these instructions.  Will watch your condition.  Will get help right away if you are not doing well or get worse. Document Released: 06/03/2005 Document Revised: 08/26/2011 Document Reviewed: 01/21/2011 ExitCare Patient Information 2015 [employer]. This information is not intended to replace advice given to you by your health care provider. Make sure you discuss any questions you have with your health care   provider.  

## 2014-08-12 NOTE — Telephone Encounter (Signed)
-----   Message from Gordy Levan, MD sent at 08/12/2014  8:29 AM EST ----- If still concerns today, probably best to check CBC and may need [place] to see. Apparently she had prolonged low counts after cycle 2, no change in doses cycle 3 on 2-12, did have neulasta at least after cycle 3.  thanks

## 2014-08-12 NOTE — Telephone Encounter (Signed)
Gave avs & calendar for February/March. Sent message to schedule fluids

## 2014-08-13 ENCOUNTER — Encounter: Payer: Self-pay | Admitting: Nurse Practitioner

## 2014-08-13 DIAGNOSIS — R740 Nonspecific elevation of levels of transaminase and lactic acid dehydrogenase [LDH]: Secondary | ICD-10-CM

## 2014-08-13 DIAGNOSIS — R7401 Elevation of levels of liver transaminase levels: Secondary | ICD-10-CM | POA: Insufficient documentation

## 2014-08-13 DIAGNOSIS — E8809 Other disorders of plasma-protein metabolism, not elsewhere classified: Secondary | ICD-10-CM | POA: Insufficient documentation

## 2014-08-13 DIAGNOSIS — R630 Anorexia: Secondary | ICD-10-CM | POA: Insufficient documentation

## 2014-08-13 DIAGNOSIS — R531 Weakness: Secondary | ICD-10-CM | POA: Insufficient documentation

## 2014-08-13 DIAGNOSIS — R509 Fever, unspecified: Secondary | ICD-10-CM | POA: Insufficient documentation

## 2014-08-13 DIAGNOSIS — K5900 Constipation, unspecified: Secondary | ICD-10-CM | POA: Insufficient documentation

## 2014-08-13 DIAGNOSIS — E860 Dehydration: Secondary | ICD-10-CM | POA: Insufficient documentation

## 2014-08-13 NOTE — Assessment & Plan Note (Signed)
Patient is complaining of some chronic constipation issues.  Patient states that she does take stool softeners on a fairly regular basis; but has not MiraLAX.  Advised patient to continue his dose of her stress daily; and to use MiraLAX least once daily to prevent chronic issues of constipation.  Patient does state she had her last regular bowel movement just yesterday however.she has been denies any nausea or vomiting.

## 2014-08-13 NOTE — Assessment & Plan Note (Signed)
Alkaline phosphatase has increased from 152 of 219 this week.  Most likely, this is related to her cancer diagnosis as well.  Will continue to monitor closely.

## 2014-08-13 NOTE — Assessment & Plan Note (Signed)
AST is increased from 43 up to 119; and ALT has increased from 48-179 today.  Most likely this is secondary to patient's cancer diagnosis as well.  Patient will obtain a restaging CT this coming Monday, 09/13/2014 for further evaluation.

## 2014-08-13 NOTE — Assessment & Plan Note (Signed)
Patient is complaining of progressive weakness.  She also has minimal appetite and is dehydrated today.  Hopefully, receiving IV fluid rehydration today will help somewhat.  However, there is concern that patient's progressive weakness could possibly be secondary to progression of her disease.  Patient is scheduled for a restaging scans this coming Monday, 09/13/2014.

## 2014-08-13 NOTE — Assessment & Plan Note (Signed)
Patient reports minimal appetite and poor oral intake recently.  She doesn't feel dehydrated today.  Patient will receive 1 L normal saline IV fluid rehydration while at the cancer Center today.  Is also encouraged to push fluids is much as possible.

## 2014-08-13 NOTE — Assessment & Plan Note (Signed)
Patient has had a low-grade fever off-and-on this entire week.  Temperature at presentation at [facility] was 99.7.  Patient also had a temperature of 99.1 this past Tuesday 3/23 2016; and was treated for acute esophagitis and otitis with Zithromax prescription.  Patient has been taking the Zithromax as previously directed.  She states she still has 2 days left of the Zithromax prescription.  He paragraph patient currently with no complaints of either sore throat or ear pain.  Have advised patient to discontinue the Zithromax; and will prescribe Levaquin for the patient should take.

## 2014-08-13 NOTE — Assessment & Plan Note (Addendum)
Patient complaining of minimal appetite and poor oral intake.  Most likely, this is secondary to her cancer diagnosis.  Patient was encouraged to multiple small meals throughout the day.

## 2014-08-13 NOTE — Assessment & Plan Note (Signed)
Patient is scheduled to initiate carboplatin/paclitaxel chemotherapy on 08/19/2014.  Given patient's date abdominal discomfort, transaminitis, hyperbilirubinemia, and elevated alkaline phosphatase-patient will obtain a CT with contrast for restaging purposes this coming Monday 3/29 2016.  She will also return to the [facility] for repeat labs, visit, and probable IV fluid rehydration.  Patient was encouraged to go to the emergency department over the weekend she develops any worsening symptoms whatsoever.

## 2014-08-13 NOTE — Progress Notes (Signed)
SYMPTOM MANAGEMENT CLINIC   HPI: Sheena Caldwell 64 y.o. female diagnosed with endometrial cancer.  Planning to initiate proton/paclitaxel chemotherapy regimen on 08/19/2014.  Patient presented to the [facility] earlier this week on Tuesday 08/09/2014 with complaint of low-grade fever and mild dyspnea.  She was diagnosed with acute esophagitis and acute otitis; and prescribed Zithromax at that time.  She called the cancer Center earlier today with complaint of continued low-grade fever, minimal appetite and poor oral intake, dehydration, occasional dyspnea, and progressive weakness.  She states she also has chronic constipation; but reports she did have a normal bowel movement just yesterday.  She denies any nausea or vomiting.  She denies any chest pain, chest pressure, or pain with inspiration.  She does complain of some vague, intermittent abdominal discomfort as well.   HPI  ROS  Past Medical History  Diagnosis Date  . Uterine cancer     Past Surgical History  Procedure Laterality Date  . Cesarean section    . Incise and drain abcess  1970    scalp    has COPD (chronic obstructive pulmonary disease); Macrocytosis without anemia; Endometrial cancer; Tobacco abuse, episodic; Esophagitis; Chemotherapy induced nausea and vomiting; Chemotherapy induced neutropenia; Portacath in place; Broken teeth; Poor dentition; Weakness; Fever; Constipation; Anorexia; Dehydration; Hypoalbuminemia; Transaminitis; Hyperbilirubinemia; and Hyperphosphatemia on her problem list.    has No Known Allergies.    Medication List       This list is accurate as of: 08/12/14 11:59 PM.  Always use your most recent med list.               clonazePAM 1 MG tablet  Commonly known as:  KLONOPIN  Take 1 mg by mouth 2 (two) times daily as needed for anxiety (for restless legs).     docusate sodium 100 MG capsule  Commonly known as:  COLACE  Take 100 mg by mouth daily as needed for mild constipation.     ferrous sulfate 325 (65 FE) MG tablet  Take 325 mg by mouth daily.     HYDROcodone-acetaminophen 5-325 MG per tablet  Commonly known as:  NORCO/VICODIN  Take 1 tablet by mouth every 4 (four) hours as needed for moderate pain.     levofloxacin 500 MG tablet  Commonly known as:  LEVAQUIN  Take 1 tablet (500 mg total) by mouth daily.     loratadine 10 MG tablet  Commonly known as:  CLARITIN  Take 10 mg by mouth daily.     MULTI-VITAMINS Tabs  Take 1 tablet by mouth daily.     naproxen sodium 220 MG tablet  Commonly known as:  ANAPROX  Take 220 mg by mouth 2 (two) times daily with a meal. Pt takes med as needed     ondansetron 8 MG tablet  Commonly known as:  ZOFRAN  Take 8 mg by mouth every 8 (eight) hours as needed for nausea or vomiting.     polyethylene glycol packet  Commonly known as:  MIRALAX / GLYCOLAX  Take 17 g by mouth daily as needed.     promethazine 12.5 MG tablet  Commonly known as:  PHENERGAN  Take 12.5 mg by mouth every 6 (six) hours as needed for nausea or vomiting.         PHYSICAL EXAMINATION  Blood pressure 94/53, pulse 98, temperature 99.7 F (37.6 C), temperature source Oral, resp. rate 32, weight 136 lb 11.2 oz (62.007 kg).  Physical Exam  Constitutional: She is oriented to person,  place, and time. She appears malnourished and dehydrated. She appears unhealthy. She appears cachectic.  Patient appears fatigued, weak, and chronically ill.  HENT:  Head: Normocephalic and atraumatic.  Mouth/Throat: Oropharynx is clear and moist.  Oropharynx clear; no erythema or exudate.  Patient does have multiple broken teeth; but no noted gum inflammation or tenderness, no obvious dental issues.  Bilateral TMs intact with no acute effusions.  Eyes: Conjunctivae and EOM are normal. Pupils are equal, round, and reactive to light. Right eye exhibits no discharge. Left eye exhibits no discharge. No scleral icterus.  Neck: Normal range of motion. Neck supple. No JVD  present. No tracheal deviation present. No thyromegaly present.  Cardiovascular: Normal rate, regular rhythm, normal heart sounds and intact distal pulses.   Pulmonary/Chest: Effort normal and breath sounds normal. No respiratory distress. She has no wheezes. She has no rales. She exhibits no tenderness.  Abdominal: Soft. Bowel sounds are normal. She exhibits no distension and no mass. There is tenderness. There is no rebound and no guarding.  Mild tenderness to right upper quadrant and epigastric region with deep palpation only.  Musculoskeletal: Normal range of motion. She exhibits no edema or tenderness.  Lymphadenopathy:    She has no cervical adenopathy.  Neurological: She is alert and oriented to person, place, and time.  Skin: Skin is warm and dry. No rash noted. No erythema.  Psychiatric: Affect normal.    LABORATORY DATA:. Appointment on 08/12/2014  Component Date Value Ref Range Status  . WBC 08/12/2014 16.3* 3.9 - 10.3 10e3/uL Final  . NEUT# 08/12/2014 14.7* 1.5 - 6.5 10e3/uL Final  . HGB 08/12/2014 10.7* 11.6 - 15.9 g/dL Final  . HCT 08/12/2014 34.5* 34.8 - 46.6 % Final  . Platelets 08/12/2014 113* 145 - 400 10e3/uL Final  . MCV 08/12/2014 113.5* 79.5 - 101.0 fL Final  . MCH 08/12/2014 35.2* 25.1 - 34.0 pg Final  . MCHC 08/12/2014 31.0* 31.5 - 36.0 g/dL Final  . RBC 08/12/2014 3.04* 3.70 - 5.45 10e6/uL Final  . RDW 08/12/2014 14.4  11.2 - 14.5 % Final  . lymph# 08/12/2014 0.8* 0.9 - 3.3 10e3/uL Final  . MONO# 08/12/2014 0.8  0.1 - 0.9 10e3/uL Final  . Eosinophils Absolute 08/12/2014 0.0  0.0 - 0.5 10e3/uL Final  . Basophils Absolute 08/12/2014 0.0  0.0 - 0.1 10e3/uL Final  . NEUT% 08/12/2014 90.2* 38.4 - 76.8 % Final  . LYMPH% 08/12/2014 4.8* 14.0 - 49.7 % Final  . MONO% 08/12/2014 4.9  0.0 - 14.0 % Final  . EOS% 08/12/2014 0.0  0.0 - 7.0 % Final  . BASO% 08/12/2014 0.1  0.0 - 2.0 % Final  . nRBC 08/12/2014 0  0 - 0 % Final  . Sodium 08/12/2014 137  136 - 145 mEq/L  Final  . Potassium 08/12/2014 4.1  3.5 - 5.1 mEq/L Final  . Chloride 08/12/2014 100  98 - 109 mEq/L Final  . CO2 08/12/2014 27  22 - 29 mEq/L Final  . Glucose 08/12/2014 150* 70 - 140 mg/dl Final  . BUN 08/12/2014 11.7  7.0 - 26.0 mg/dL Final  . Creatinine 08/12/2014 0.8  0.6 - 1.1 mg/dL Final  . Total Bilirubin 08/12/2014 2.38* 0.20 - 1.20 mg/dL Final  . Alkaline Phosphatase 08/12/2014 219* 40 - 150 U/L Final  . AST 08/12/2014 119* 5 - 34 U/L Final  . ALT 08/12/2014 179* 0 - 55 U/L Final  . Total Protein 08/12/2014 7.7  6.4 - 8.3 g/dL Final  . Albumin  08/12/2014 2.6* 3.5 - 5.0 g/dL Final  . Calcium 08/12/2014 9.8  8.4 - 10.4 mg/dL Final  . Anion Gap 08/12/2014 11  3 - 11 mEq/L Final  . EGFR 08/12/2014 >90  >90 ml/min/1.73 m2 Final   eGFR is calculated using the CKD-EPI Creatinine Equation (2009)     RADIOGRAPHIC STUDIES: No results found.  ASSESSMENT/PLAN:    Anorexia Patient complaining of minimal appetite and poor oral intake.  Most likely, this is secondary to her cancer diagnosis.  Patient was encouraged to multiple small meals throughout the day.   Constipation Patient is complaining of some chronic constipation issues.  Patient states that she does take stool softeners on a fairly regular basis; but has not MiraLAX.  Advised patient to continue his dose of her stress daily; and to use MiraLAX least once daily to prevent chronic issues of constipation.  Patient does state she had her last regular bowel movement just yesterday however.she has been denies any nausea or vomiting.   Dehydration Patient reports minimal appetite and poor oral intake recently.  She doesn't feel dehydrated today.  Patient will receive 1 L normal saline IV fluid rehydration while at the cancer Center today.  Is also encouraged to push fluids is much as possible.   Endometrial cancer Patient is scheduled to initiate carboplatin/paclitaxel chemotherapy on 08/19/2014.  Given patient's date abdominal  discomfort, transaminitis, hyperbilirubinemia, and elevated alkaline phosphatase-patient will obtain a CT with contrast for restaging purposes this coming Monday 3/29 2016.  She will also return to the [facility] for repeat labs, visit, and probable IV fluid rehydration.  Patient was encouraged to go to the emergency department over the weekend she develops any worsening symptoms whatsoever.   Fever Patient has had a low-grade fever off-and-on this entire week.  Temperature at presentation at [facility] was 99.7.  Patient also had a temperature of 99.1 this past Tuesday 3/23 2016; and was treated for acute esophagitis and otitis with Zithromax prescription.  Patient has been taking the Zithromax as previously directed.  She states she still has 2 days left of the Zithromax prescription.  He paragraph patient currently with no complaints of either sore throat or ear pain.  Have advised patient to discontinue the Zithromax; and will prescribe Levaquin for the patient should take.   Hyperbilirubinemia Bilirubin has increased from 0.27 up to 2.38.  Patient is also complaining of some vague right upper quadrant and upper epigastric pain with palpation.  Bowel sounds positive in all 4 quads.  Abdomen is soft on exam.  Will obtain a restaging CT this coming Monday 09/13/2014 for further evaluation.  Advised patient to to go directed to the emergency department of the weekend she develops worsening symptoms whatsoever.   Hyperphosphatemia Alkaline phosphatase has increased from 152 of 219 this week.  Most likely, this is related to her cancer diagnosis as well.  Will continue to monitor closely.   Hypoalbuminemia Albumin has decreased from 3.2 down to 2.6.  Patient was encouraged to push protein in her diet is much as possible.   Transaminitis AST is increased from 43 up to 119; and ALT has increased from 48-179 today.  Most likely this is secondary to patient's cancer diagnosis as well.   Patient will obtain a restaging CT this coming Monday, 09/13/2014 for further evaluation.   Weakness Patient is complaining of progressive weakness.  She also has minimal appetite and is dehydrated today.  Hopefully, receiving IV fluid rehydration today will help somewhat.  However,  there is concern that patient's progressive weakness could possibly be secondary to progression of her disease.  Patient is scheduled for a restaging scans this coming Monday, 09/13/2014.   Patient stated understanding of all instructions; and was in agreement with this plan of care. The patient knows to call the clinic with any problems, questions or concerns.   Review/collaboration with Dr. Marko Plume regarding all aspects of patient's visit today.   Total time spent with patient was 40 minutes;  with greater than 75 percent of that time spent in face to face counseling regarding patient's symptoms,  and coordination of care and follow up.  Disclaimer: This note was dictated with voice recognition software. Similar sounding words can inadvertently be transcribed and may not be corrected upon review.   Drue Second, NP 08/13/2014

## 2014-08-13 NOTE — Assessment & Plan Note (Signed)
Albumin has decreased from 3.2 down to 2.6.  Patient was encouraged to push protein in her diet is much as possible.

## 2014-08-13 NOTE — Assessment & Plan Note (Signed)
Bilirubin has increased from 0.27 up to 2.38.  Patient is also complaining of some vague right upper quadrant and upper epigastric pain with palpation.  Bowel sounds positive in all 4 quads.  Abdomen is soft on exam.  Will obtain a restaging CT this coming Monday 09/13/2014 for further evaluation.  Advised patient to to go directed to the emergency department of the weekend she develops worsening symptoms whatsoever.

## 2014-08-15 ENCOUNTER — Telehealth: Payer: Self-pay

## 2014-08-15 ENCOUNTER — Ambulatory Visit (HOSPITAL_BASED_OUTPATIENT_CLINIC_OR_DEPARTMENT_OTHER): Payer: Medicaid Other | Admitting: Nurse Practitioner

## 2014-08-15 ENCOUNTER — Telehealth: Payer: Self-pay | Admitting: Nurse Practitioner

## 2014-08-15 ENCOUNTER — Encounter: Payer: Self-pay | Admitting: Nurse Practitioner

## 2014-08-15 ENCOUNTER — Ambulatory Visit: Payer: Medicaid Other | Admitting: Oncology

## 2014-08-15 ENCOUNTER — Ambulatory Visit (HOSPITAL_COMMUNITY)
Admission: RE | Admit: 2014-08-15 | Discharge: 2014-08-15 | Disposition: A | Discharge status: Home / Self Care | Payer: Medicaid Other | Source: Ambulatory Visit | Attending: Nurse Practitioner | Admitting: Nurse Practitioner

## 2014-08-15 ENCOUNTER — Ambulatory Visit: Payer: Medicaid Other

## 2014-08-15 ENCOUNTER — Encounter (HOSPITAL_COMMUNITY): Payer: Self-pay

## 2014-08-15 ENCOUNTER — Telehealth (HOSPITAL_COMMUNITY): Payer: Self-pay

## 2014-08-15 ENCOUNTER — Other Ambulatory Visit: Payer: Medicaid Other

## 2014-08-15 ENCOUNTER — Other Ambulatory Visit: Payer: Self-pay | Admitting: *Deleted

## 2014-08-15 ENCOUNTER — Telehealth: Payer: Self-pay | Admitting: Oncology

## 2014-08-15 ENCOUNTER — Other Ambulatory Visit (HOSPITAL_BASED_OUTPATIENT_CLINIC_OR_DEPARTMENT_OTHER): Payer: Medicaid Other

## 2014-08-15 VITALS — BP 89/64 | HR 79 | Temp 98.5°F | Resp 24 | Wt 137.1 lb

## 2014-08-15 DIAGNOSIS — R1011 Right upper quadrant pain: Secondary | ICD-10-CM | POA: Diagnosis not present

## 2014-08-15 DIAGNOSIS — E860 Dehydration: Secondary | ICD-10-CM

## 2014-08-15 DIAGNOSIS — R740 Nonspecific elevation of levels of transaminase and lactic acid dehydrogenase [LDH]: Secondary | ICD-10-CM

## 2014-08-15 DIAGNOSIS — C541 Malignant neoplasm of endometrium: Secondary | ICD-10-CM

## 2014-08-15 DIAGNOSIS — E8809 Other disorders of plasma-protein metabolism, not elsewhere classified: Secondary | ICD-10-CM

## 2014-08-15 DIAGNOSIS — R630 Anorexia: Secondary | ICD-10-CM

## 2014-08-15 DIAGNOSIS — Z79899 Other long term (current) drug therapy: Secondary | ICD-10-CM | POA: Diagnosis not present

## 2014-08-15 DIAGNOSIS — R1013 Epigastric pain: Secondary | ICD-10-CM

## 2014-08-15 DIAGNOSIS — R5084 Febrile nonhemolytic transfusion reaction: Secondary | ICD-10-CM

## 2014-08-15 DIAGNOSIS — R109 Unspecified abdominal pain: Secondary | ICD-10-CM | POA: Insufficient documentation

## 2014-08-15 DIAGNOSIS — K5900 Constipation, unspecified: Secondary | ICD-10-CM

## 2014-08-15 DIAGNOSIS — R531 Weakness: Secondary | ICD-10-CM | POA: Insufficient documentation

## 2014-08-15 DIAGNOSIS — R319 Hematuria, unspecified: Secondary | ICD-10-CM

## 2014-08-15 DIAGNOSIS — R7401 Elevation of levels of liver transaminase levels: Secondary | ICD-10-CM

## 2014-08-15 DIAGNOSIS — I959 Hypotension, unspecified: Secondary | ICD-10-CM

## 2014-08-15 LAB — COMPREHENSIVE METABOLIC PANEL (CC13)
ALBUMIN: 2.2 g/dL — AB (ref 3.5–5.0)
ALT: 120 U/L — AB (ref 0–55)
ANION GAP: 11 meq/L (ref 3–11)
AST: 78 U/L — ABNORMAL HIGH (ref 5–34)
Alkaline Phosphatase: 264 U/L — ABNORMAL HIGH (ref 40–150)
BUN: 9.1 mg/dL (ref 7.0–26.0)
CALCIUM: 9.5 mg/dL (ref 8.4–10.4)
CHLORIDE: 99 meq/L (ref 98–109)
CO2: 23 meq/L (ref 22–29)
CREATININE: 0.7 mg/dL (ref 0.6–1.1)
EGFR: >90 mL/min/{1.73_m2} (ref >90)
Glucose: 102 mg/dl (ref 70–140)
Potassium: 4.2 mEq/L (ref 3.5–5.1)
Sodium: 133 mEq/L — ABNORMAL LOW (ref 136–145)
Total Bilirubin: 3.85 mg/dL (ref 0.20–1.20)
Total Protein: 7.8 g/dL (ref 6.4–8.3)

## 2014-08-15 LAB — CBC WITH DIFFERENTIAL/PLATELET
BASO%: 0.4 % (ref 0.0–2.0)
BASOS ABS: 0.1 10*3/uL (ref 0.0–0.1)
EOS ABS: 0 10*3/uL (ref 0.0–0.5)
EOS%: 0.1 % (ref 0.0–7.0)
HCT: 28.1 % — ABNORMAL LOW (ref 34.8–46.6)
HGB: 9.3 g/dL — ABNORMAL LOW (ref 11.6–15.9)
LYMPH%: 6 % — AB (ref 14.0–49.7)
MCH: 34.8 pg — ABNORMAL HIGH (ref 25.1–34.0)
MCHC: 33 g/dL (ref 31.5–36.0)
MCV: 105.4 fL — ABNORMAL HIGH (ref 79.5–101.0)
MONO#: 1.2 10*3/uL — AB (ref 0.1–0.9)
MONO%: 8.9 % (ref 0.0–14.0)
NEUT#: 11.8 10*3/uL — ABNORMAL HIGH (ref 1.5–6.5)
NEUT%: 84.6 % — AB (ref 38.4–76.8)
PLATELETS: 194 10*3/uL (ref 145–400)
RBC: 2.67 10*6/uL — ABNORMAL LOW (ref 3.70–5.45)
RDW: 13.7 % (ref 11.2–14.5)
WBC: 14 10*3/uL — ABNORMAL HIGH (ref 3.9–10.3)
lymph#: 0.8 10*3/uL — ABNORMAL LOW (ref 0.9–3.3)

## 2014-08-15 LAB — URINALYSIS, MICROSCOPIC - CHCC
Glucose: NEGATIVE mg/dL
KETONES: NEGATIVE mg/dL
Leukocyte Esterase: NEGATIVE
Nitrite: NEGATIVE
PH: 5 (ref 4.6–8.0)
Protein: 30 mg/dL
SPECIFIC GRAVITY, URINE: 1.005 (ref 1.003–1.035)
Urobilinogen, UR: 8 mg/dL (ref 0.2–1)

## 2014-08-15 LAB — TECHNOLOGIST REVIEW

## 2014-08-15 MED ORDER — IOHEXOL 300 MG/ML  SOLN
100.0000 mL | Freq: Once | INTRAMUSCULAR | Status: AC | PRN
  Administered 2014-08-15: 100 mL via INTRAVENOUS

## 2014-08-15 MED ORDER — PANTOPRAZOLE SODIUM 40 MG PO TBEC: 40.0000 mg | DELAYED_RELEASE_TABLET | Freq: Every day | ORAL | Status: DC

## 2014-08-15 NOTE — Telephone Encounter (Signed)
Pt called asking we mail her labs and CT scan results b/c she left them when she left. Done

## 2014-08-15 NOTE — Assessment & Plan Note (Signed)
Patient is complaining of some chronic constipation issues.  Patient states that she does take stool softeners on a fairly regular basis; but has not taken MiraLAX.  Advised patient to continue stool softeners on a daily basis; and to use MiraLAX at least once daily to prevent chronic issues of constipation.  She may increase the Mira lax to once every 6 hours until she has cleared her constipation.  Patient states that she plans to go home and drink prune juice; which always helps.

## 2014-08-15 NOTE — Telephone Encounter (Signed)
08/15/14                  Called to schedule Dental Consult w/Dr. Enrique Sack and patient refused to schedule.  LRI

## 2014-08-15 NOTE — Assessment & Plan Note (Signed)
Patient continues to complain of some right upper quadrant and epigastric pain on exam today.  She denies any nausea/vomiting or diarrhea.  She does feel constipated today.  On exam-mild tenderness to epigastric region only with palpation.  Bowel sounds are positive in abdomen is soft.  Advised patient was prescribed Protonix for patient to try on a daily basis for possible gastritis.

## 2014-08-15 NOTE — Assessment & Plan Note (Addendum)
Patient was scheduled to initiate carboplatin/paclitaxel chemotherapy on 08/19/2014.  Given patient's c/o abdominal discomfort, transaminitis, hyperbilirubinemia, and elevated alkaline phosphatase-patient obtained a CT with contrast for restaging purposes earlier today.  Ct scan revealed a mixed response to therapy so far; but no specific findings to explain hyperbilirubinemia.   Advised patient would prescribe Protonix for chronic epigastric region pain.  Patient advised would hold chemotherapy previously scheduled for this coming Friday, 08/19/2014.  Advised patient she appeared dehydrated; and would probably benefit from IV fluid rehydration today.  Also, advised patient would like to add labs including LDH and haptoglobin; as well as an urinalysis/culture.  Patient became frustrated and tearful; stating that the she is tired of being stuck with needles, obtaining more test, and refused IV fluids today.  Patient states that she will go home and push fluids instead.  Patient states she has plans to follow back up with her previous oncologist Dr. Sabra Heck.  The plan is for the patient to hold chemotherapy for this coming Friday, 08/19/2014; but to return to the [facility] for labs and a follow-up appointment on 08/19/14. Unclear at this time if the patient will actually return for these appointments given discussion with her today.  Also, patient refused additional lab draw to obtain LDH and haptoglobin today.

## 2014-08-15 NOTE — Assessment & Plan Note (Signed)
Patient is complaining of progressive weakness.  She also has minimal appetite and is dehydrated today.  Most likely, patient's weakness is secondary to both her cancer diagnosis and continued dehydration.  However, patient refuses IV fluid rehydration today.

## 2014-08-15 NOTE — Assessment & Plan Note (Signed)
Liver enzymes have actually improved since her last lab draw last week-with AST down from 119-78.  ALT improved from 179 down to 120.  Will continue to monitor closely.

## 2014-08-15 NOTE — Telephone Encounter (Signed)
-----   Message from Gordy Levan, MD sent at 08/12/2014 10:10 AM EST ----- Please get copy of CT report done in [facility] by APP Suzann Hedgecock with "[facility] [place]" probably ~ Sept 2015. Not a hurry.  Need to be sure copy also gets scanned into this EMR  thanks

## 2014-08-15 NOTE — Assessment & Plan Note (Signed)
Patient is afebrile today with; with a temperature of 98.5.  She continues to take Levaquin as previously directed.

## 2014-08-15 NOTE — Telephone Encounter (Signed)
Medical Oncology  MD spoke with office of Dr Brand Males ([facility] [place],  [phone]), requested last few office notes as baseline BP not known. Patient was last seen at that office Sept 2015. Records to be faxed.  Additionally, report of CT AP done 03-07-14 at [facility] received, this the baseline CT prior to surgery. Copy sent to be scanned into this EMR. I am not aware of any interim scans between this imaging and CT done at [facility] today. CC of today's CT, my office note last week, APP note last week all routed now to Dr Jacquelyne Balint.  Godfrey Pick, MD

## 2014-08-15 NOTE — Telephone Encounter (Signed)
S/w pt confirming labs/ov and cancel chemo for Friday 03/04 per 02/29 POF.... Pt states she doesn't understand why she is seeing a NP instead of her MD, she is contacting her other Dr for a referral in [facility], if she finds one she will call to cancel this Friday's visit.... KJ

## 2014-08-15 NOTE — Progress Notes (Signed)
SYMPTOM MANAGEMENT CLINIC   HPI: Sheena Caldwell 64 y.o. female diagnosed with endometrial cancer.  Plan was to initiate proton/paclitaxel chemotherapy regimen on 08/19/2014.  Patient returns to the cancer today for follow-up.  She was previously seen this past Friday, 03/13/2015 for complaint of progressive weakness, minimal appetite, poor oral intake, dehydration, and abdominal pain.  She was also febrile at that time with a temperature of 99.7.  She was currently being treated that time for acute esophagitis and otitis with Zithromax.  Labs obtained Friday the 26 2016 revealed a hyperbilirubinemia.  Given patient's epigastric and right upper quadrant pain-patient was ordered a CT with contrast of the abdomen and pelvis-which she obtained earlier today.  Patient presents back to the [facility] today for follow-up of her scan results; and for labs and possible IV fluid rehydration.  Patient continues with complaint of weakness, dehydration, abdominal discomfort.  She is also complaining of continued/progressive constipation as well; stating her last bowel movement was this past Thursday, 08/11/2014.  She states that she plans to go home; and drink prune juice.  She denies any fevers over this past weekend.  She continues to take the Levaquin as previously directed.  She denies any continued URI or respiratory symptoms whatsoever.  She denies any nausea or vomiting.  HPI  ROS  Past Medical History  Diagnosis Date  . Uterine cancer     Past Surgical History  Procedure Laterality Date  . Cesarean section    . Incise and drain abcess  1970    scalp    has COPD (chronic obstructive pulmonary disease); Macrocytosis without anemia; Endometrial cancer; Tobacco abuse, episodic; Esophagitis; Chemotherapy induced nausea and vomiting; Chemotherapy induced neutropenia; Portacath in place; Broken teeth; Poor dentition; Weakness; Fever; Constipation; Anorexia; Dehydration; Hypoalbuminemia;  Transaminitis; Hyperbilirubinemia; Hyperphosphatemia; Hematuria; Abdominal pain; and Hypotension on her problem list.    has No Known Allergies.    Medication List       This list is accurate as of: 08/15/14 11:57 AM.  Always use your most recent med list.               clonazePAM 1 MG tablet  Commonly known as:  KLONOPIN  Take 1 mg by mouth 2 (two) times daily as needed for anxiety (for restless legs).     docusate sodium 100 MG capsule  Commonly known as:  COLACE  Take 100 mg by mouth daily as needed for mild constipation.     ferrous sulfate 325 (65 FE) MG tablet  Take 325 mg by mouth daily.     HYDROcodone-acetaminophen 5-325 MG per tablet  Commonly known as:  NORCO/VICODIN  Take 1 tablet by mouth every 4 (four) hours as needed for moderate pain.     levofloxacin 500 MG tablet  Commonly known as:  LEVAQUIN  Take 1 tablet (500 mg total) by mouth daily.     loratadine 10 MG tablet  Commonly known as:  CLARITIN  Take 10 mg by mouth daily.     MULTI-VITAMINS Tabs  Take 1 tablet by mouth daily.     naproxen sodium 220 MG tablet  Commonly known as:  ANAPROX  Take 220 mg by mouth 2 (two) times daily with a meal. Pt takes med as needed     ondansetron 8 MG tablet  Commonly known as:  ZOFRAN  Take 8 mg by mouth every 8 (eight) hours as needed for nausea or vomiting.     polyethylene glycol packet  Commonly known  as:  MIRALAX / GLYCOLAX  Take 17 g by mouth daily as needed.     promethazine 12.5 MG tablet  Commonly known as:  PHENERGAN  Take 12.5 mg by mouth every 6 (six) hours as needed for nausea or vomiting.         PHYSICAL EXAMINATION  Blood pressure 89/64, pulse 79, temperature 98.5 F (36.9 C), temperature source Oral, resp. rate 24, weight 137 lb 1.6 oz (62.188 kg).  Physical Exam  Constitutional: She is oriented to person, place, and time. She appears malnourished and dehydrated. She appears unhealthy. She appears cachectic.  Patient appears  fatigued, weak, and chronically ill.  HENT:  Head: Normocephalic and atraumatic.  Mouth/Throat: Oropharynx is clear and moist.  Oropharynx clear; no erythema or exudate.  Patient does have multiple broken teeth; but no noted gum inflammation or tenderness, no obvious dental issues.  Bilateral TMs intact with no acute effusions.  Eyes: Conjunctivae and EOM are normal. Pupils are equal, round, and reactive to light. Right eye exhibits no discharge. Left eye exhibits no discharge. No scleral icterus.  Neck: Normal range of motion. Neck supple. No JVD present. No tracheal deviation present. No thyromegaly present.  Cardiovascular: Normal rate, regular rhythm, normal heart sounds and intact distal pulses.   Pulmonary/Chest: Effort normal and breath sounds normal. No respiratory distress. She has no wheezes. She has no rales. She exhibits no tenderness.  Abdominal: Soft. Bowel sounds are normal. She exhibits no distension and no mass. There is tenderness. There is no rebound and no guarding.  Mild tenderness to right upper quadrant and epigastric region with deep palpation only.  Musculoskeletal: Normal range of motion. She exhibits no edema or tenderness.  Lymphadenopathy:    She has no cervical adenopathy.  Neurological: She is alert and oriented to person, place, and time.  Skin: Skin is warm and dry. No rash noted. No erythema.  Psychiatric: Affect normal.  Nursing note and vitals reviewed.   LABORATORY DATA:. Orders Only on 08/15/2014  Component Date Value Ref Range Status  . Glucose 08/15/2014 Negative  Negative mg/dL Final  . Bilirubin (Urine) 08/15/2014 Color Interference  Negative Final  . Ketones 08/15/2014 Negative  Negative mg/dL Final  . Specific Gravity, Urine 08/15/2014 1.005  1.003 - 1.035 Final  . Blood 08/15/2014 Large  Negative Final  . pH 08/15/2014 5.0  4.6 - 8.0 Final  . Protein 08/15/2014 30  Negative- <30 mg/dL Final  . Urobilinogen, UR 08/15/2014 8  0.2 - 1 mg/dL  Final  . Nitrite 08/15/2014 Negative  Negative Final  . Leukocyte Esterase 08/15/2014 Negative  Negative Final  . RBC / HPF 08/15/2014 7-10  0 - 2 Final  . WBC, UA 08/15/2014 0-2  0 - 2 Final  . Bacteria, UA 08/15/2014 Few  Negative- Trace Final  . Epithelial Cells 08/15/2014 Few  Negative- Few Final  Appointment on 08/15/2014  Component Date Value Ref Range Status  . WBC 08/15/2014 14.0* 3.9 - 10.3 10e3/uL Final  . NEUT# 08/15/2014 11.8* 1.5 - 6.5 10e3/uL Final  . HGB 08/15/2014 9.3* 11.6 - 15.9 g/dL Final  . HCT 08/15/2014 28.1* 34.8 - 46.6 % Final  . Platelets 08/15/2014 194  145 - 400 10e3/uL Final  . MCV 08/15/2014 105.4* 79.5 - 101.0 fL Final  . MCH 08/15/2014 34.8* 25.1 - 34.0 pg Final  . MCHC 08/15/2014 33.0  31.5 - 36.0 g/dL Final  . RBC 08/15/2014 2.67* 3.70 - 5.45 10e6/uL Final  . RDW 08/15/2014 13.7  11.2 - 14.5 % Final  . lymph# 08/15/2014 0.8* 0.9 - 3.3 10e3/uL Final  . MONO# 08/15/2014 1.2* 0.1 - 0.9 10e3/uL Final  . Eosinophils Absolute 08/15/2014 0.0  0.0 - 0.5 10e3/uL Final  . Basophils Absolute 08/15/2014 0.1  0.0 - 0.1 10e3/uL Final  . NEUT% 08/15/2014 84.6* 38.4 - 76.8 % Final  . LYMPH% 08/15/2014 6.0* 14.0 - 49.7 % Final  . MONO% 08/15/2014 8.9  0.0 - 14.0 % Final  . EOS% 08/15/2014 0.1  0.0 - 7.0 % Final  . BASO% 08/15/2014 0.4  0.0 - 2.0 % Final  . Sodium 08/15/2014 133* 136 - 145 mEq/L Final  . Potassium 08/15/2014 4.2  3.5 - 5.1 mEq/L Final  . Chloride 08/15/2014 99  98 - 109 mEq/L Final  . CO2 08/15/2014 23  22 - 29 mEq/L Final  . Glucose 08/15/2014 102  70 - 140 mg/dl Final  . BUN 08/15/2014 9.1  7.0 - 26.0 mg/dL Final  . Creatinine 08/15/2014 0.7  0.6 - 1.1 mg/dL Final  . Total Bilirubin 08/15/2014 3.85* 0.20 - 1.20 mg/dL Final  . Alkaline Phosphatase 08/15/2014 264* 40 - 150 U/L Final  . AST 08/15/2014 78* 5 - 34 U/L Final  . ALT 08/15/2014 120* 0 - 55 U/L Final  . Total Protein 08/15/2014 7.8  6.4 - 8.3 g/dL Final  . Albumin 08/15/2014 2.2* 3.5 -  5.0 g/dL Final  . Calcium 08/15/2014 9.5  8.4 - 10.4 mg/dL Final  . Anion Gap 08/15/2014 11  3 - 11 mEq/L Final  . EGFR 08/15/2014 >90  >90 ml/min/1.73 m2 Final   eGFR is calculated using the CKD-EPI Creatinine Equation (2009)  . Technologist Review 08/15/2014 Rare NRBC   Final     RADIOGRAPHIC STUDIES: Ct Abdomen Pelvis W Contrast  08/15/2014   CLINICAL DATA:  Endometrial cancer diagnosed 10/15 with chemotherapy in progress. Weakness. Decreased appetite. Right upper quadrant/ epigastric pain.  EXAM: CT ABDOMEN AND PELVIS WITH CONTRAST  TECHNIQUE: Multidetector CT imaging of the abdomen and pelvis was performed using the standard protocol following bolus administration of intravenous contrast.  CONTRAST:  172m OMNIPAQUE IOHEXOL 300 MG/ML  SOLN  COMPARISON:  03/07/2014 CT [facility] of 03/25/2014 from [facility]  FINDINGS: Lower chest: 6 mm lingular nodule on image 5 measured 5 mm on the prior chest CT. Felt to be similar. Left base atelectasis. A 3 mm right lower lobe pulmonary nodule on image 6 is also felt to be similar. Mild cardiomegaly with similar small pericardial effusion. New left greater than right pleural effusions. Small.  Hepatobiliary: Focal steatosis adjacent the falciform ligament. Gallbladder decompressed. No biliary ductal dilatation.  Pancreas: Normal, without mass or ductal dilatation.  Spleen: Normal  Adrenals/Urinary Tract: Normal adrenal glands. Bilateral renal cysts. No hydronephrosis. Normal urinary bladder.  Stomach/Bowel: Normal stomach, without wall thickening. Colonic stool burden suggests constipation. Normal terminal ileum and appendix. Normal small bowel.  Vascular/Lymphatic: There is soft tissue density surrounding the aorta at the level of the diaphragmatic hiatus. This is new. Example image 15 of series 2. Normal caliber of the aorta and branch vessels.  11 mm left periaortic retroperitoneal low-density structure is suspicious for a necrotic  lymph node. This is decreased from 1.3 cm on the prior exam (image 26 of that study). No pelvic adenopathy.  Reproductive: Interval hysterectomy.  No adnexal mass.  Other: No significant free fluid. No evidence of omental or peritoneal disease.  Musculoskeletal: Mild left hip osteoarthritis. Disc bulges  at multiple lumbar levels.  IMPRESSION: 1. Interval hysterectomy. 2. Retroperitoneal low-density structures which are suspicious for necrotic adenopathy/metastasis. Left periaortic component is slightly decreased in size. However, there is new soft tissue thickening about the abdominal aorta more superiorly. Less likely differential considerations include benign lymphangioma/lymphangiomas and/or concurrent vasculitis. Consider PET for further evaluation. 3. Development of bilateral pleural effusions with left base atelectasis. 4. Indeterminate bibasilar pulmonary nodules, felt to be similar. Consider further evaluation with chest CT, as recommended on 03/25/2014. 5.  Possible constipation.   Electronically Signed   By: Abigail Miyamoto M.D.   On: 08/15/2014 08:11    ASSESSMENT/PLAN:    Abdominal pain Patient continues to complain of some right upper quadrant and epigastric pain on exam today.  She denies any nausea/vomiting or diarrhea.  She does feel constipated today.  On exam-mild tenderness to epigastric region only with palpation.  Bowel sounds are positive in abdomen is soft.  Advised patient was prescribed Protonix for patient to try on a daily basis for possible gastritis.   Anorexia Patient complaining of minimal appetite and poor oral intake.  Most likely, this is secondary to her cancer diagnosis.  Patient was encouraged to multiple small meals throughout the day.     Constipation Patient is complaining of some chronic constipation issues.  Patient states that she does take stool softeners on a fairly regular basis; but has not taken MiraLAX.  Advised patient to continue stool softeners on  a daily basis; and to use MiraLAX at least once daily to prevent chronic issues of constipation.  She may increase the Mira lax to once every 6 hours until she has cleared her constipation.  Patient states that she plans to go home and drink prune juice; which always helps.     Dehydration Patient reports minimal appetite and poor oral intake recently.    Sodium level was 133 today.  Patient does appear dehydrated.  However, patient refused IV fluid rehydration today; stating that she will push fluids at home instead.  Advised patient that she may call at anytime this week for further evaluation and/or IV fluid rehydration.     Endometrial cancer Patient was scheduled to initiate carboplatin/paclitaxel chemotherapy on 08/19/2014.  Given patient's c/o abdominal discomfort, transaminitis, hyperbilirubinemia, and elevated alkaline phosphatase-patient obtained a CT with contrast for restaging purposes earlier today.  Ct scan revealed a mixed response to therapy so far; but no specific findings to explain hyperbilirubinemia.   Advised patient would prescribe Protonix for chronic epigastric region pain.  Patient advised would hold chemotherapy previously scheduled for this coming Friday, 08/19/2014.  Advised patient she appeared dehydrated; and would probably benefit from IV fluid rehydration today.  Also, advised patient would like to add labs including LDH and haptoglobin; as well as an urinalysis/culture.  Patient became frustrated and tearful; stating that the she is tired of being stuck with needles, obtaining more test, and refused IV fluids today.  Patient states that she will go home and push fluids instead.  Patient states she has plans to follow back up with her previous oncologist Dr. Sabra Heck.  The plan is for the patient to hold chemotherapy for this coming Friday, 08/19/2014; but to return to the [facility] for labs and a follow-up appointment on 08/19/14. Unclear at this time if the  patient will actually return for these appointments given discussion with her today.  Also, patient refused additional lab draw to obtain LDH and haptoglobin today.        Hematuria  Patient is reporting that her urine color changes after she takes the Levaquin antibiotics.  She states that her urine will clear to its baseline color later each day.  Long discussion with both patient and her aunt regarding the possibility of the Levaquin causing the urine discoloration.  Advised patient that Levaquin does not typically change the color of her urine; but is always something to consider.  Urinalysis obtained today revealed a large amount of blood, urobilinogen 8, with nitrites negative; and WBC 0-2.    Patient was afebrile today.  Patient continues to take Levaquin as previously directed.  Most likely, urobilinogen secondary to hyperbilirubinemia.  Will need to monitor very closely.  Also, will await urine culture results as well.   Hyperbilirubinemia Bilirubin has increased from baseline  0.27 to 3.85.  staging CT with contrast of the abdomen/pelvis obtained earlier this morning revealed no definitive cause for hyperbilirubinemia today.  We'll continue to monitor closely.  Advised patient to to go directed to the emergency department of the weekend she develops worsening symptoms whatsoever.     Hyperphosphatemia Alkaline phosphatase continues to trend up; from 219 up to 264 today.  Most likely, this is secondary to cancer diagnosis.  Will continue to monitor.   Hypoalbuminemia Albumin has decreased from 2.6 to 2.2.  Patient was encouraged to push protein in her diet is much as possible.     Transaminitis Liver enzymes have actually improved since her last lab draw last week-with AST down from 119-78.  ALT improved from 179 down to 120.  Will continue to monitor closely.   Weakness Patient is complaining of progressive weakness.  She also has minimal appetite and is dehydrated  today.  Most likely, patient's weakness is secondary to both her cancer diagnosis and continued dehydration.  However, patient refuses IV fluid rehydration today.     Fever Patient is afebrile today with; with a temperature of 98.5.  She continues to take Levaquin as previously directed.   Hypotension Blood pressure this past Friday was 94/53.  Blood pressure today decreased to 89/64.  Confirmed patient does not take any blood pressure medications whatsoever.  Most likely, hypotension is secondary to dehydration.  However, patient's baseline systolic blood pressure is between 99 and 110.  Patient refused any IV fluid rehydration today; stating that she will go home to push fluids instead.    Patient stated understanding of all instructions; and was in agreement with this plan of care. The patient knows to call the clinic with any problems, questions or concerns.   Review/collaboration with Dr. Marko Plume regarding all aspects of patient's visit today.   Total time spent with patient was 40 minutes;  with greater than 75 percent of that time spent in face to face counseling regarding patient's symptoms,  and coordination of care and follow up.  Disclaimer: This note was dictated with voice recognition software. Similar sounding words can inadvertently be transcribed and may not be corrected upon review.   Drue Second, NP 08/15/2014

## 2014-08-15 NOTE — Assessment & Plan Note (Signed)
Blood pressure this past Friday was 94/53.  Blood pressure today decreased to 89/64.  Confirmed patient does not take any blood pressure medications whatsoever.  Most likely, hypotension is secondary to dehydration.  However, patient's baseline systolic blood pressure is between 99 and 110.  Patient refused any IV fluid rehydration today; stating that she will go home to push fluids instead.

## 2014-08-15 NOTE — Telephone Encounter (Signed)
Received report as requested below by Dr. Marko Plume.  Copy of the report given to Dr. Marko Plume to review and a copy to sent to HIM to be scanned into patient's EMR.

## 2014-08-15 NOTE — Assessment & Plan Note (Signed)
Alkaline phosphatase continues to trend up; from 219 up to 264 today.  Most likely, this is secondary to cancer diagnosis.  Will continue to monitor.

## 2014-08-15 NOTE — Assessment & Plan Note (Signed)
Patient is reporting that her urine color changes after she takes the Levaquin antibiotics.  She states that her urine will clear to its baseline color later each day.  Long discussion with both patient and her aunt regarding the possibility of the Levaquin causing the urine discoloration.  Advised patient that Levaquin does not typically change the color of her urine; but is always something to consider.  Urinalysis obtained today revealed a large amount of blood, urobilinogen 8, with nitrites negative; and WBC 0-2.    Patient was afebrile today.  Patient continues to take Levaquin as previously directed.  Most likely, urobilinogen secondary to hyperbilirubinemia.  Will need to monitor very closely.  Also, will await urine culture results as well.

## 2014-08-15 NOTE — Assessment & Plan Note (Signed)
Patient complaining of minimal appetite and poor oral intake.  Most likely, this is secondary to her cancer diagnosis.  Patient was encouraged to multiple small meals throughout the day.

## 2014-08-15 NOTE — Assessment & Plan Note (Signed)
Patient reports minimal appetite and poor oral intake recently.    Sodium level was 133 today.  Patient does appear dehydrated.  However, patient refused IV fluid rehydration today; stating that she will push fluids at home instead.  Advised patient that she may call at anytime this week for further evaluation and/or IV fluid rehydration.

## 2014-08-15 NOTE — Assessment & Plan Note (Signed)
Bilirubin has increased from baseline  0.27 to 3.85.  staging CT with contrast of the abdomen/pelvis obtained earlier this morning revealed no definitive cause for hyperbilirubinemia today.  We'll continue to monitor closely.  Advised patient to to go directed to the emergency department of the weekend she develops worsening symptoms whatsoever.

## 2014-08-15 NOTE — Assessment & Plan Note (Signed)
Albumin has decreased from 2.6 to 2.2.  Patient was encouraged to push protein in her diet is much as possible.

## 2014-08-16 ENCOUNTER — Telehealth: Payer: Self-pay | Admitting: *Deleted

## 2014-08-16 LAB — URINE CULTURE

## 2014-08-16 NOTE — Telephone Encounter (Signed)
Fae Pippin called to see if results are back from urine culture- still pending. Ms Owens Shark apologized for Ms Emrich behavior. RN to call results when available

## 2014-08-17 ENCOUNTER — Telehealth: Payer: Self-pay | Admitting: Nurse Practitioner

## 2014-08-17 ENCOUNTER — Other Ambulatory Visit: Payer: Self-pay | Admitting: Nurse Practitioner

## 2014-08-17 ENCOUNTER — Telehealth: Payer: Self-pay | Admitting: *Deleted

## 2014-08-17 NOTE — Telephone Encounter (Signed)
EXPLAINED TO PT. THAT HER TOTAL BILIRUBIN IS TOO HIGH TO RECEIVE CHEMO THIS WEEK. SHE NEEDS TO KEEP HER LAB APPOINTMENT AND VISIT WITH CINDEE BACON,NP ON Friday SO SHE CAN BE EVALUATED FOR POSSIBLE IV FLUIDS ON Friday AND POSSIBLE CHEMO NEXT WEEK. PT. VOICES UNDERSTANDING.

## 2014-08-17 NOTE — Telephone Encounter (Signed)
pt cld & left a vm in re to appt & call recvd from Corine Shelter per pof it was to CX chemo and see Cindy on 3/4-pt wanted to spk to Cindy-put pt on hold to get [facility] nurse # pt hung up

## 2014-08-17 NOTE — Telephone Encounter (Signed)
The chemo apt is not / or is no longer scheduled for 3-4 that I see in EMR, and I think this is best since she has felt so badly in past week. I 'm glad she is doing better - if she is really over the illness by next week and still wants to be treated in [place], can move treatment to next week. I will ask my RN to check on her Friday 3-4 if she does not keep apt with [facility] on Friday 3-4. Will set up chemo after we know how she is on 3-4.  thanks

## 2014-08-17 NOTE — Telephone Encounter (Signed)
Called patient's aunt back to review pt's negative urine culture results. Pt continues to take Levaqiun as previously directed.  Patient's aunt states that patient is feeling much better today.  Patient's aunt is also questioning if patient will receive chemotherapy this coming Friday, 08/19/2014.  Advised aunt would have someone confirm patient scheduling; and call her back.

## 2014-08-19 ENCOUNTER — Ambulatory Visit: Payer: Medicaid Other

## 2014-08-19 ENCOUNTER — Other Ambulatory Visit: Payer: Self-pay | Admitting: Gynecologic Oncology

## 2014-08-19 ENCOUNTER — Encounter: Payer: Self-pay | Admitting: Nurse Practitioner

## 2014-08-19 ENCOUNTER — Ambulatory Visit (HOSPITAL_BASED_OUTPATIENT_CLINIC_OR_DEPARTMENT_OTHER): Payer: Medicaid Other | Admitting: Nurse Practitioner

## 2014-08-19 ENCOUNTER — Other Ambulatory Visit: Payer: Medicaid Other

## 2014-08-19 ENCOUNTER — Other Ambulatory Visit: Payer: Self-pay | Admitting: *Deleted

## 2014-08-19 ENCOUNTER — Other Ambulatory Visit (HOSPITAL_BASED_OUTPATIENT_CLINIC_OR_DEPARTMENT_OTHER): Payer: Medicaid Other

## 2014-08-19 ENCOUNTER — Other Ambulatory Visit: Payer: Self-pay | Admitting: Emergency Medicine

## 2014-08-19 VITALS — BP 93/62 | HR 80 | Temp 98.6°F | Resp 20 | Wt 136.0 lb

## 2014-08-19 DIAGNOSIS — R531 Weakness: Secondary | ICD-10-CM

## 2014-08-19 DIAGNOSIS — R319 Hematuria, unspecified: Secondary | ICD-10-CM

## 2014-08-19 DIAGNOSIS — E8809 Other disorders of plasma-protein metabolism, not elsewhere classified: Secondary | ICD-10-CM

## 2014-08-19 DIAGNOSIS — C541 Malignant neoplasm of endometrium: Secondary | ICD-10-CM

## 2014-08-19 DIAGNOSIS — R740 Nonspecific elevation of levels of transaminase and lactic acid dehydrogenase [LDH]: Secondary | ICD-10-CM

## 2014-08-19 DIAGNOSIS — R7401 Elevation of levels of liver transaminase levels: Secondary | ICD-10-CM

## 2014-08-19 DIAGNOSIS — E860 Dehydration: Secondary | ICD-10-CM

## 2014-08-19 DIAGNOSIS — I959 Hypotension, unspecified: Secondary | ICD-10-CM

## 2014-08-19 DIAGNOSIS — R630 Anorexia: Secondary | ICD-10-CM

## 2014-08-19 LAB — URINALYSIS, MICROSCOPIC - CHCC
BILIRUBIN (URINE): NEGATIVE
GLUCOSE UR CHCC: NEGATIVE mg/dL
Ketones: NEGATIVE mg/dL
LEUKOCYTE ESTERASE: NEGATIVE
NITRITE: NEGATIVE
PROTEIN: NEGATIVE mg/dL
Specific Gravity, Urine: 1.01 (ref 1.003–1.035)
Urobilinogen, UR: 1 mg/dL (ref 0.2–1)
pH: 6.5 (ref 4.6–8.0)

## 2014-08-19 LAB — CBC WITH DIFFERENTIAL/PLATELET
BASO%: 0.8 % (ref 0.0–2.0)
Basophils Absolute: 0.1 10*3/uL (ref 0.0–0.1)
EOS%: 0.8 % (ref 0.0–7.0)
Eosinophils Absolute: 0.1 10*3/uL (ref 0.0–0.5)
HEMATOCRIT: 26.7 % — AB (ref 34.8–46.6)
HEMOGLOBIN: 8.7 g/dL — AB (ref 11.6–15.9)
LYMPH%: 14.5 % (ref 14.0–49.7)
MCH: 35.1 pg — ABNORMAL HIGH (ref 25.1–34.0)
MCHC: 32.6 g/dL (ref 31.5–36.0)
MCV: 107.5 fL — ABNORMAL HIGH (ref 79.5–101.0)
MONO#: 0.7 10*3/uL (ref 0.1–0.9)
MONO%: 9 % (ref 0.0–14.0)
NEUT#: 5.9 10*3/uL (ref 1.5–6.5)
NEUT%: 74.9 % (ref 38.4–76.8)
PLATELETS: 304 10*3/uL (ref 145–400)
RBC: 2.48 10*6/uL — AB (ref 3.70–5.45)
RDW: 14.1 % (ref 11.2–14.5)
WBC: 7.9 10*3/uL (ref 3.9–10.3)
lymph#: 1.2 10*3/uL (ref 0.9–3.3)

## 2014-08-19 LAB — COMPREHENSIVE METABOLIC PANEL (CC13)
ALT: 60 U/L — AB (ref 0–55)
ANION GAP: 10 meq/L (ref 3–11)
AST: 29 U/L (ref 5–34)
Albumin: 2.3 g/dL — ABNORMAL LOW (ref 3.5–5.0)
Alkaline Phosphatase: 212 U/L — ABNORMAL HIGH (ref 40–150)
BILIRUBIN TOTAL: 0.95 mg/dL (ref 0.20–1.20)
BUN: 7.8 mg/dL (ref 7.0–26.0)
CALCIUM: 9.6 mg/dL (ref 8.4–10.4)
CHLORIDE: 102 meq/L (ref 98–109)
CO2: 26 mEq/L (ref 22–29)
Creatinine: 0.7 mg/dL (ref 0.6–1.1)
EGFR: >90 mL/min/{1.73_m2} (ref >90)
Glucose: 93 mg/dl (ref 70–140)
Potassium: 4.2 mEq/L (ref 3.5–5.1)
SODIUM: 139 meq/L (ref 136–145)
TOTAL PROTEIN: 7.9 g/dL (ref 6.4–8.3)

## 2014-08-19 LAB — LACTATE DEHYDROGENASE (CC13): LDH: 128 U/L (ref 125–245)

## 2014-08-19 MED ORDER — SODIUM CHLORIDE 0.9 % IV SOLN: INTRAVENOUS | Status: AC

## 2014-08-19 NOTE — Progress Notes (Signed)
PAC tip placement: per IR report 04/26/2014 "The tip is in the SVC" per Langston Reusing, MD from outside facility.

## 2014-08-19 NOTE — Progress Notes (Signed)
Attempted to find tip placement of port and explained to patient that this was our policy before we could use the port. Patient stated that her port had been accessed here before. Unable to find documentation, patient did state that we could do a peripheral IV but then she said that it was taking too long and then decided that she would not get the IV fluids, stating that it was her choice. Selena Lesser came and spoke with patient in infusion. Did receive documentation of port and it will be scanned into medical record.

## 2014-08-20 LAB — HAPTOGLOBIN: Haptoglobin: 449 mg/dL — ABNORMAL HIGH (ref 43–212)

## 2014-08-21 LAB — URINE CULTURE

## 2014-08-22 ENCOUNTER — Ambulatory Visit: Payer: Medicaid Other

## 2014-08-22 ENCOUNTER — Encounter: Payer: Self-pay | Admitting: Nurse Practitioner

## 2014-08-22 ENCOUNTER — Telehealth: Payer: Self-pay | Admitting: Oncology

## 2014-08-22 ENCOUNTER — Telehealth: Payer: Self-pay

## 2014-08-22 ENCOUNTER — Other Ambulatory Visit: Payer: Self-pay | Admitting: Oncology

## 2014-08-22 NOTE — Progress Notes (Signed)
SYMPTOM MANAGEMENT CLINIC   HPI: Sheena Caldwell 64 y.o. female diagnosed with endometrial cancer.  Plan was to initiate proton/paclitaxel chemotherapy regimen on 08/19/2014.  Patient returns to the cancer today for follow-up.  She was previously seen this past Tuesday 08/15/14 for follow up of progressive weakness, minimal appetite, poor oral intake, dehydration, and abdominal pain. She was treated recently with Zithromax for both acute esophagitis and otitis.  The Zithromax was later changed to Levaquin due to low-grade fever and worry of worsening infection.  Patient states she only took 2-3 days of the Levaquin; stating that it made her stomach upset and caused her urine to be dark.  She has been trying to push fluids and eat multiple small meals throughout the day.  She denies any nausea/vomiting or diarrhea/constipation recently.  She denies any abdominal discomfort now.  She denies any dysuria; and states her urine is no longer dark.  She denies any recent fevers or chills.  She denies any URI symptoms or cough. Marland Kitchen  HPI  ROS  Past Medical History  Diagnosis Date  . Uterine cancer     Past Surgical History  Procedure Laterality Date  . Cesarean section    . Incise and drain abcess  1970    scalp    has COPD (chronic obstructive pulmonary disease); Macrocytosis without anemia; Endometrial cancer; Tobacco abuse, episodic; Esophagitis; Chemotherapy induced nausea and vomiting; Chemotherapy induced neutropenia; Portacath in place; Broken teeth; Poor dentition; Weakness; Fever; Constipation; Anorexia; Dehydration; Hypoalbuminemia; Transaminitis; Hyperbilirubinemia; Hematuria; Abdominal pain; and Hypotension on her problem list.    is allergic to levaquin.    Medication List       This list is accurate as of: 08/19/14 11:59 PM.  Always use your most recent med list.               clonazePAM 1 MG tablet  Commonly known as:  KLONOPIN  Take 1 mg by mouth 2 (two) times daily as  needed for anxiety (for restless legs).     docusate sodium 100 MG capsule  Commonly known as:  COLACE  Take 100 mg by mouth daily as needed for mild constipation.     ferrous sulfate 325 (65 FE) MG tablet  Take 325 mg by mouth daily.     HYDROcodone-acetaminophen 5-325 MG per tablet  Commonly known as:  NORCO/VICODIN  Take 1 tablet by mouth every 4 (four) hours as needed for moderate pain.     loratadine 10 MG tablet  Commonly known as:  CLARITIN  Take 10 mg by mouth daily.     MULTI-VITAMINS Tabs  Take 1 tablet by mouth daily.     naproxen sodium 220 MG tablet  Commonly known as:  ANAPROX  Take 220 mg by mouth 2 (two) times daily with a meal. Pt takes med as needed     ondansetron 8 MG tablet  Commonly known as:  ZOFRAN  Take 8 mg by mouth every 8 (eight) hours as needed for nausea or vomiting.     pantoprazole 40 MG tablet  Commonly known as:  PROTONIX  Take 1 tablet (40 mg total) by mouth daily.     polyethylene glycol packet  Commonly known as:  MIRALAX / GLYCOLAX  Take 17 g by mouth daily as needed.     promethazine 12.5 MG tablet  Commonly known as:  PHENERGAN  Take 12.5 mg by mouth every 6 (six) hours as needed for nausea or vomiting.  PHYSICAL EXAMINATION  Blood pressure 93/62, pulse 80, temperature 98.6 F (37 C), temperature source Oral, resp. rate 20, weight 136 lb (61.689 kg).  Physical Exam  Constitutional: She is oriented to person, place, and time. She appears malnourished and dehydrated. She appears unhealthy. She appears cachectic.  Patient appears fatigued, weak, and chronically ill.  HENT:  Head: Normocephalic and atraumatic.  Mouth/Throat: Oropharynx is clear and moist.  Oropharynx clear; no erythema or exudate.  Patient does have multiple broken teeth; but no noted gum inflammation or tenderness, no obvious dental issues.  Bilateral TMs intact with no acute effusions.  Eyes: Conjunctivae and EOM are normal. Pupils are equal, round,  and reactive to light. Right eye exhibits no discharge. Left eye exhibits no discharge. No scleral icterus.  Neck: Normal range of motion. Neck supple. No JVD present. No tracheal deviation present. No thyromegaly present.  Cardiovascular: Normal rate, regular rhythm, normal heart sounds and intact distal pulses.   Pulmonary/Chest: Effort normal and breath sounds normal. No respiratory distress. She has no wheezes. She has no rales. She exhibits no tenderness.  Abdominal: Soft. Bowel sounds are normal. She exhibits no distension and no mass. There is tenderness. There is no rebound and no guarding.  Mild tenderness to right upper quadrant and epigastric region with deep palpation only.  Musculoskeletal: Normal range of motion. She exhibits no edema or tenderness.  Lymphadenopathy:    She has no cervical adenopathy.  Neurological: She is alert and oriented to person, place, and time.  Skin: Skin is warm and dry. No rash noted. No erythema.  Psychiatric: Affect normal.  Nursing note and vitals reviewed.   LABORATORY DATA:. Appointment on 08/19/2014  Component Date Value Ref Range Status  . Sodium 08/19/2014 139  136 - 145 mEq/L Final  . Potassium 08/19/2014 4.2  3.5 - 5.1 mEq/L Final  . Chloride 08/19/2014 102  98 - 109 mEq/L Final  . CO2 08/19/2014 26  22 - 29 mEq/L Final  . Glucose 08/19/2014 93  70 - 140 mg/dl Final  . BUN 08/19/2014 7.8  7.0 - 26.0 mg/dL Final  . Creatinine 08/19/2014 0.7  0.6 - 1.1 mg/dL Final  . Total Bilirubin 08/19/2014 0.95  0.20 - 1.20 mg/dL Final  . Alkaline Phosphatase 08/19/2014 212* 40 - 150 U/L Final  . AST 08/19/2014 29  5 - 34 U/L Final  . ALT 08/19/2014 60* 0 - 55 U/L Final  . Total Protein 08/19/2014 7.9  6.4 - 8.3 g/dL Final  . Albumin 08/19/2014 2.3* 3.5 - 5.0 g/dL Final  . Calcium 08/19/2014 9.6  8.4 - 10.4 mg/dL Final  . Anion Gap 08/19/2014 10  3 - 11 mEq/L Final  . EGFR 08/19/2014 >90  >90 ml/min/1.73 m2 Final   eGFR is calculated using the  CKD-EPI Creatinine Equation (2009)  . Haptoglobin 08/19/2014 449* 43 - 212 mg/dL Final  . WBC 08/19/2014 7.9  3.9 - 10.3 10e3/uL Final  . NEUT# 08/19/2014 5.9  1.5 - 6.5 10e3/uL Final  . HGB 08/19/2014 8.7* 11.6 - 15.9 g/dL Final  . HCT 08/19/2014 26.7* 34.8 - 46.6 % Final  . Platelets 08/19/2014 304  145 - 400 10e3/uL Final  . MCV 08/19/2014 107.5* 79.5 - 101.0 fL Final  . MCH 08/19/2014 35.1* 25.1 - 34.0 pg Final  . MCHC 08/19/2014 32.6  31.5 - 36.0 g/dL Final  . RBC 08/19/2014 2.48* 3.70 - 5.45 10e6/uL Final  . RDW 08/19/2014 14.1  11.2 - 14.5 % Final  .  lymph# 08/19/2014 1.2  0.9 - 3.3 10e3/uL Final  . MONO# 08/19/2014 0.7  0.1 - 0.9 10e3/uL Final  . Eosinophils Absolute 08/19/2014 0.1  0.0 - 0.5 10e3/uL Final  . Basophils Absolute 08/19/2014 0.1  0.0 - 0.1 10e3/uL Final  . NEUT% 08/19/2014 74.9  38.4 - 76.8 % Final  . LYMPH% 08/19/2014 14.5  14.0 - 49.7 % Final  . MONO% 08/19/2014 9.0  0.0 - 14.0 % Final  . EOS% 08/19/2014 0.8  0.0 - 7.0 % Final  . BASO% 08/19/2014 0.8  0.0 - 2.0 % Final  . LDH 08/19/2014 128  125 - 245 U/L Final  . Glucose 08/19/2014 Negative  Negative mg/dL Final  . Bilirubin (Urine) 08/19/2014 Negative  Negative Final  . Ketones 08/19/2014 Negative  Negative mg/dL Final  . Specific Gravity, Urine 08/19/2014 1.010  1.003 - 1.035 Final  . Blood 08/19/2014 Small  Negative Final  . pH 08/19/2014 6.5  4.6 - 8.0 Final  . Protein 08/19/2014 Negative  Negative- <30 mg/dL Final  . Urobilinogen, UR 08/19/2014 1  0.2 - 1 mg/dL Final  . Nitrite 08/19/2014 Negative  Negative Final  . Leukocyte Esterase 08/19/2014 Negative  Negative Final  . RBC / HPF 08/19/2014 3-6  0 - 2 Final  . WBC, UA 08/19/2014 0-2  0 - 2 Final  . Bacteria, UA 08/19/2014 Few  Negative- Trace Final  . Epithelial Cells 08/19/2014 Few  Negative- Few Final  . Urine Culture, Routine 08/19/2014 Culture, Urine   Final   Comment: Final - ===== COLONY COUNT: ===== 4,000 COLONIES/ML Insignificant  Growth      RADIOGRAPHIC STUDIES: No results found.  ASSESSMENT/PLAN:    Anorexia Patient continues to complain of minimal appetite and poor oral intake; but feels she has been both drinking and eating more within this past week or so.  Most likely, this is secondary to her cancer diagnosis.  Patient was encouraged to multiple small meals throughout the day.       Dehydration Patient states that she has been pushing fluids and trying to eat small meals throughout the day.  She feels less dehydrated state than last week.  On exam-patient does appear to be dehydrated but once again  patient refused IV fluid rehydration today; stating that she will push fluids at home instead.  Advised patient that she may call at anytime this week for further evaluation and/or IV fluid rehydration.       Endometrial cancer Patient was scheduled to initiate carboplatin/paclitaxel chemotherapy on 08/19/2014.  Given patient's c/o abdominal discomfort, transaminitis, hyperbilirubinemia, and elevated alkaline phosphatase-patient obtained a CT with contrast for restaging purposes.  Ct scan revealed a mixed response to therapy so far; but no specific findings to explain hyperbilirubinemia.   Prescribedrotonix for chronic epigastric region pain.  Patient advised would hold chemotherapy previously scheduled for this coming Friday, 08/19/2014.  Advised patient she appeared dehydrated; and would probably benefit from IV fluid rehydration today.  Also, advised patient would like to add labs including LDH and haptoglobin; as well as an urinalysis/culture.  Patient refused IV fluid rehydration and once again today.    The plan is for the patient to return this coming Wednesday, 08/24/2014 for initiation of her chemotherapy.            Hematuria Patient reports that her urine has returned to its normal baseline color.  She denies any dysuria or frequency.  She continues to believe the Levaquin she took  for just a few  days discolored her urine.  Patient states that she only completed 2-3 days of the Levaquin due to the discoloration of her urine.  Long discussion with both patient and her aunt regarding the likelihood of the elevated bilirubin and gross hematuria causing the hematuria versus the Levaquin.  Urinalysis obtained today reveals no obvious infection; but a small amount of hematuria.   Patient was afebrile today.     Hyperbilirubinemia Bilirubin has normalized back down to 0.95 today.  Will need to continue monitoring closely.   Hypoalbuminemia Albumin has decreased from 2.6 to 2.2.  Patient was encouraged to push protein in her diet is much as possible.       Hypotension Blood pressure on arrival to the [facility] today was 93/62.  Patient was afebrile on initial check.  Looking back at patient's previous vital signs-does appear the patient's baseline  systolic blood pressure runs in the 90s.  We'll continue to monitor closely.     Transaminitis Both AST and ALT are almost completely normalized at this point.  Will continue to monitor closely.   Weakness Patient does appear much stronger today on exam than on previous exams.  Patient also appears slightly dehydrated; but much less dehydrated than on previous exams as well.  Advised patient to continue to push fluids is much as possible; and also to remain as active as possible.    Patient stated understanding of all instructions; and was in agreement with this plan of care. The patient knows to call the clinic with any problems, questions or concerns.   Review/collaboration with Dr. Marko Plume regarding all aspects of patient's visit today.   Total time spent with patient was 40 minutes;  with greater than 75 percent of that time spent in face to face counseling regarding patient's symptoms,  and coordination of care and follow up.  Disclaimer: This note was dictated with voice recognition software. Similar sounding  words can inadvertently be transcribed and may not be corrected upon review.   Drue Second, NP 08/22/2014

## 2014-08-22 NOTE — Assessment & Plan Note (Addendum)
Bilirubin has normalized back down to 0.95 today.  Will need to continue monitoring closely.

## 2014-08-22 NOTE — Telephone Encounter (Signed)
Medical Oncology  MD aware of concerns since patient was seen as new on 08-09-14, with several visits to symptom management clinic since then, all encounters discussed in detail with NP as this was ongoing.  Patient refused IVF on 08-19-14 when she was seen most recently at symptom management clinic, and was adamant that she receive chemotherapy on 08-24-14.   I have spoken with pateint by phone now, explaining that MD needs to see her prior to next chemotherapy treatment. I have offered to work her in to see MD in AM 3-8, or to move chemo to 3-11 so that she can see MD same day. She tells me that AM 3-8 is not convenient as she planned to vote on 3-8, that she feels she is being "run too much" and that she would like to transfer care back to [facility] to Dr Jacquelyne Balint. I have explained to her that I need to be sure that she is recovered from other problems sufficiently to receive next chemo, if she does plan to have this in [place]. I have told her that certainly she can transfer care back to Dr Sabra Heck for remainder of chemotherapy if she wishes; she tells me that she prefers next treatment at this office. She agrees to coming for lab at 0800 and MD at 0830 on 08-23-14. Patient very emotional on phone, but understands that I am glad to see her as offered tomorrow and am concerned about her.   POF to schedulers to add appointments on 3-8 and cancel NP on 3-9.   Godfrey Pick, MD

## 2014-08-22 NOTE — Telephone Encounter (Signed)
Patient called inquiring about next appointment.  Informed patient that either physician or nurse will call her back today (08/22/14) once follow-up time is finalized.  Patient verbalized understanding.

## 2014-08-22 NOTE — Telephone Encounter (Signed)
Medical Oncology  Patient had LM with symptom management clinic requesting return phone call, which this MD did now. She is aware of lab 0800 and MD 0830 tomorrow (08-23-14) and plans to keep the appointments.  She tells me that she called symptom management clinic to follow up on urine specimen from last week, which had a few RBCs and culture negative. She tells me that urine was dark previously, not frankly bloody, and is clear again now. I have offered repeat urine with other labs on 3-8, which she declines.  She denies any other needs now. Patient seemed calm during this phone conversation and expressed appreciation for the call.  Godfrey Pick, MD

## 2014-08-22 NOTE — Assessment & Plan Note (Signed)
Patient does appear much stronger today on exam than on previous exams.  Patient also appears slightly dehydrated; but much less dehydrated than on previous exams as well.  Advised patient to continue to push fluids is much as possible; and also to remain as active as possible.

## 2014-08-22 NOTE — Telephone Encounter (Signed)
Confirm appointment for 03/08. °

## 2014-08-22 NOTE — Assessment & Plan Note (Signed)
Blood pressure on arrival to the [place] today was 93/62.  Patient was afebrile on initial check.  Looking back at patient's previous vital signs-does appear the patient's baseline  systolic blood pressure runs in the 90s.  We'll continue to monitor closely.

## 2014-08-22 NOTE — Assessment & Plan Note (Signed)
Albumin has decreased from 2.6 to 2.2.  Patient was encouraged to push protein in her diet is much as possible.

## 2014-08-22 NOTE — Assessment & Plan Note (Signed)
Patient continues to complain of minimal appetite and poor oral intake; but feels she has been both drinking and eating more within this past week or so.  Most likely, this is secondary to her cancer diagnosis.  Patient was encouraged to multiple small meals throughout the day.

## 2014-08-22 NOTE — Assessment & Plan Note (Signed)
Both AST and ALT are almost completely normalized at this point.  Will continue to monitor closely.

## 2014-08-22 NOTE — Assessment & Plan Note (Signed)
Patient was scheduled to initiate carboplatin/paclitaxel chemotherapy on 08/19/2014.  Given patient's c/o abdominal discomfort, transaminitis, hyperbilirubinemia, and elevated alkaline phosphatase-patient obtained a CT with contrast for restaging purposes.  Ct scan revealed a mixed response to therapy so far; but no specific findings to explain hyperbilirubinemia.   Prescribedrotonix for chronic epigastric region pain.  Patient advised would hold chemotherapy previously scheduled for this coming Friday, 08/19/2014.  Advised patient she appeared dehydrated; and would probably benefit from IV fluid rehydration today.  Also, advised patient would like to add labs including LDH and haptoglobin; as well as an urinalysis/culture.  Patient refused IV fluid rehydration and once again today.    The plan is for the patient to return this coming Wednesday, 08/24/2014 for initiation of her chemotherapy.

## 2014-08-22 NOTE — Assessment & Plan Note (Signed)
Patient reports that her urine has returned to its normal baseline color.  She denies any dysuria or frequency.  She continues to believe the Levaquin she took for just a few days discolored her urine.  Patient states that she only completed 2-3 days of the Levaquin due to the discoloration of her urine.  Long discussion with both patient and her aunt regarding the likelihood of the elevated bilirubin and gross hematuria causing the hematuria versus the Levaquin.  Urinalysis obtained today reveals no obvious infection; but a small amount of hematuria.   Patient was afebrile today.

## 2014-08-22 NOTE — Assessment & Plan Note (Signed)
Patient states that she has been pushing fluids and trying to eat small meals throughout the day.  She feels less dehydrated state than last week.  On exam-patient does appear to be dehydrated but once again  patient refused IV fluid rehydration today; stating that she will push fluids at home instead.  Advised patient that she may call at anytime this week for further evaluation and/or IV fluid rehydration.

## 2014-08-23 ENCOUNTER — Telehealth: Payer: Self-pay | Admitting: *Deleted

## 2014-08-23 ENCOUNTER — Telehealth: Payer: Self-pay | Admitting: Oncology

## 2014-08-23 ENCOUNTER — Ambulatory Visit (HOSPITAL_BASED_OUTPATIENT_CLINIC_OR_DEPARTMENT_OTHER): Payer: Medicaid Other | Admitting: Oncology

## 2014-08-23 ENCOUNTER — Other Ambulatory Visit (HOSPITAL_BASED_OUTPATIENT_CLINIC_OR_DEPARTMENT_OTHER): Payer: Medicaid Other

## 2014-08-23 ENCOUNTER — Other Ambulatory Visit: Payer: Self-pay | Admitting: Oncology

## 2014-08-23 ENCOUNTER — Encounter: Payer: Self-pay | Admitting: Oncology

## 2014-08-23 VITALS — BP 97/67 | HR 84 | Temp 98.0°F | Resp 18 | Ht 66.0 in | Wt 134.9 lb

## 2014-08-23 DIAGNOSIS — D649 Anemia, unspecified: Secondary | ICD-10-CM

## 2014-08-23 DIAGNOSIS — D539 Nutritional anemia, unspecified: Secondary | ICD-10-CM

## 2014-08-23 DIAGNOSIS — Z87891 Personal history of nicotine dependence: Secondary | ICD-10-CM

## 2014-08-23 DIAGNOSIS — C541 Malignant neoplasm of endometrium: Secondary | ICD-10-CM

## 2014-08-23 DIAGNOSIS — G629 Polyneuropathy, unspecified: Secondary | ICD-10-CM

## 2014-08-23 DIAGNOSIS — F1290 Cannabis use, unspecified, uncomplicated: Secondary | ICD-10-CM

## 2014-08-23 DIAGNOSIS — R319 Hematuria, unspecified: Secondary | ICD-10-CM

## 2014-08-23 DIAGNOSIS — T451X5A Adverse effect of antineoplastic and immunosuppressive drugs, initial encounter: Secondary | ICD-10-CM

## 2014-08-23 DIAGNOSIS — G620 Drug-induced polyneuropathy: Secondary | ICD-10-CM | POA: Insufficient documentation

## 2014-08-23 LAB — CBC WITH DIFFERENTIAL/PLATELET
BASO%: 0.7 % (ref 0.0–2.0)
Basophils Absolute: 0 10*3/uL (ref 0.0–0.1)
EOS ABS: 0.2 10*3/uL (ref 0.0–0.5)
EOS%: 3.2 % (ref 0.0–7.0)
HCT: 29 % — ABNORMAL LOW (ref 34.8–46.6)
HGB: 9.3 g/dL — ABNORMAL LOW (ref 11.6–15.9)
LYMPH%: 27.8 % (ref 14.0–49.7)
MCH: 34.9 pg — ABNORMAL HIGH (ref 25.1–34.0)
MCHC: 32 g/dL (ref 31.5–36.0)
MCV: 108.9 fL — AB (ref 79.5–101.0)
MONO#: 0.5 10*3/uL (ref 0.1–0.9)
MONO%: 8.6 % (ref 0.0–14.0)
NEUT%: 59.7 % (ref 38.4–76.8)
NEUTROS ABS: 3.4 10*3/uL (ref 1.5–6.5)
Platelets: 439 10*3/uL — ABNORMAL HIGH (ref 145–400)
RBC: 2.66 10*6/uL — AB (ref 3.70–5.45)
RDW: 14.3 % (ref 11.2–14.5)
WBC: 5.6 10*3/uL (ref 3.9–10.3)
lymph#: 1.6 10*3/uL (ref 0.9–3.3)

## 2014-08-23 LAB — COMPREHENSIVE METABOLIC PANEL (CC13)
ALBUMIN: 2.5 g/dL — AB (ref 3.5–5.0)
ALT: 41 U/L (ref 0–55)
ANION GAP: 10 meq/L (ref 3–11)
AST: 28 U/L (ref 5–34)
Alkaline Phosphatase: 182 U/L — ABNORMAL HIGH (ref 40–150)
BUN: 18.5 mg/dL (ref 7.0–26.0)
CALCIUM: 9.6 mg/dL (ref 8.4–10.4)
CHLORIDE: 104 meq/L (ref 98–109)
CO2: 27 mEq/L (ref 22–29)
Creatinine: 0.7 mg/dL (ref 0.6–1.1)
EGFR: >90 mL/min/{1.73_m2} (ref >90)
Glucose: 83 mg/dl (ref 70–140)
POTASSIUM: 4.3 meq/L (ref 3.5–5.1)
SODIUM: 142 meq/L (ref 136–145)
TOTAL PROTEIN: 8.4 g/dL — AB (ref 6.4–8.3)
Total Bilirubin: 0.58 mg/dL (ref 0.20–1.20)

## 2014-08-23 LAB — IRON AND TIBC CHCC
%SAT: 22 % (ref 21–57)
IRON: 43 ug/dL (ref 41–142)
TIBC: 193 ug/dL — ABNORMAL LOW (ref 236–444)
UIBC: 150 ug/dL (ref 120–384)

## 2014-08-23 LAB — VITAMIN B12

## 2014-08-23 LAB — FERRITIN CHCC: FERRITIN: 429 ng/mL — AB (ref 9–269)

## 2014-08-23 NOTE — Telephone Encounter (Signed)
-----   Message from Gordy Levan, MD sent at 08/22/2014  1:13 PM EST ----- FYI she will come see [facility] on 3-8 @ 0830 + lab. I told sched to cancel [facility] on 3-9     Buren Havey - could you please let Lauren/Abigail/Alexis know that we probably need them to help with this lady either on 3-8 or 3-9  Thank you!

## 2014-08-23 NOTE — Telephone Encounter (Signed)
Per Dr. Marko Plume, okay to use lab results from today for chemo treatment tomorrow, 08/24/14. Tomorrow's lab appt cancelled and patient notified of this and that she only needs to come for 12pm chemo appt. Patient appreciative of call.

## 2014-08-23 NOTE — Progress Notes (Signed)
OFFICE PROGRESS NOTE   August 23, 2014   Sheena Caldwell(PCP, [facility] [employer]), _ Sheena Caldwell (gyn [place]  INTERVAL HISTORY:  Patient is seen, together with aunt, in continued close attention to multiple concerns since she transferred medical oncology care to this office on 08-09-14. All of additional history and interval evaluation reviewed in detail with patient and aunt now, including additional outside medical records.  When I met patient on 08-09-14, she was day 12 cycle 3 carboplatin taxol, having had neulasta on day 3; this treatment was given by Dr Hazle Nordmann in [place]. At time of that visit, she had WBC 16, ANC 13, Hgb 11.6, plt 139, MCV 103, T bili 0.27, AP 152, AST 43 and ALT 48. She was begun on azithromycin for esophagitis/ left otitis, and took one dose claritin. She was seen on 08-12-14 with WBC 16.3, Hgb 10.7, plt 113k, Tbili 2.38, AP 219, AST 119, ALT 179; azithromycin was DC, levaquin begun and IVF given. On 08-15-14 WBC 14, Hgb 9.3, plt 194, Tbili 3.85, AP 264, AST 78, ALT 120. CT AP 08-15-14 had no obvious cholelithiasis or biliary dilatation, no obvious nephrolithiasis, other findings as below. UA 08-19-14 had large blood, 7-10 RBCs, LE negative, urobil 8, urine culture negative, WBC 7.9, Hgb 8.7, plt 304k, MCV 107.5, haptoglobin 449, LDH 128. She refused IVF on 08-19-14. Patient felt generally badly on levaquin, no specifics.   I have received some additional information from Dr Almedia Balls including office note 02-2014 (BP 83/55, weight 123 lb, complaints of ear pain, "anemia" such that fecal occult blood testing planned) and report of CT AP done at [facility] 03-07-14 with large mass in lower uterus and cervix, small renal cysts, no mention of adenopathy, liver/spleen/gallbladder/pancreas/adrenals unremarkable (reports to be scanned into this EMR). BP 02-2012 100/72, weight 135. UA 03-2007 large  hemoglobin, large leukocytes   Patient is feeling better overall today. She has had minimal blood from nose a couple of times in last 2 days, no other bleeding. She reports urine is clear now, without gross hematuria. She denies any abdominal or pelvic pain. She reports good po intake in last several days, including Ensure as breakfast, all you can eat crab legs last pm. She denies nausea, vomiting; bowels are moving well daily with 2 stool softeners daily, no miralax in last few days. She has no peripheral neuropathy in hands, some restless legs at hs and some tingling in feet at hs, but no neuopathy interfering with function. She states only alcohol is occasional red wine, none since returning to [place].  PAC in Flu vaccine 04-2014  ONCOLOGIC HISTORY Patient had been menopausal since age 71, then had one episode of vaginal bleeding early 2015. Later in 2015 she had persistent vaginal discharge, for which she was seen by PCP Sheena Caldwell (APP with [facility] [place] [facility]) in ~ 02-2014, had CTs in [facility] (reports to be requested) and was referred to Dr Sheena Caldwell, gyn in [facility]. Endometrial biopsy had concern (that path not included in information available now) such that she was referred to Dr Sheena Caldwell. Surgery by Dr Sabra Heck at [facility] in [place] on 04-06-14 was TAH, BSO, omentectomy, pelvic and right common iliac node evaluation; patient was hospitalized x 3 days and tells me that she recovered well from the surgery. 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 Bulk of the carcinoma was in lower uterine segment where it was within 0.1 cm of serosal  surface. Cytology positive for adenocarcinoma on washings ([id number]). Surgical findings were significant for no paraaortic adenopathy apparent. Patient has received 3 cycles of adjuvant chemotherapy in [facility] by Dr Sabra Heck, on 05-05-14, 05-26-14 and 07-29-14. Per records and patient, delay of cycle 3 was related to low counts and insurance changes. She received neulasta after chemotherapy on 07-29-14. Taxol was given at 175 mg/m2 for total dose was 280 mg cycle 1 and 291 mg for cycles 2 and 3; carboplatin was AUC = 6 with total dose 610 mg cycle 1, 620 mg cycle 2 and 630 mg cycle 3.. She does not recall using oral decadron premed for taxol, with premeds listed on chemo flowsheets standard decadron 20 mg, zofran 16 mg, benadryl 50 mg (which caused severe restless legs) and pepcid 20 mg. Outside information does not include serial blood counts. Last note from Dr Sabra Heck dated 07-29-14 describes unremarkable exam, "NED during chemotherapy and adequate PS". Patient was see by Dr Denman George 08-09-14, who recommends restaging scans after 6 cycles of chemotherapy and consideration of vaginal brachytherapy; she has not been seen by radiation oncology in [place].  Review of systems as above, also: No fever. No throat or ear discomfort. No flank pain. No other bleeding. Restless legs last pm, took clonazepam. No abdominal or pelvic pain. No problems with PAC. States she has trouble "with emotions" and cries easily. Remainder of 10 point Review of Systems negative.  Objective:  Vital signs in last 24 hours:  BP 97/67 mmHg  Pulse 84  Temp(Src) 98 F (36.7 C) (Oral)  Resp 18  Ht _0  (1.676 m)  Wt 134 lb 14.4 oz (61.19 kg)  BMI 21.78 kg/m2 Weight 138.9 on 2-23 and 136 on 08-23-14.  Alert, oriented, cooperative and calm. Aunt very supportive. Ambulatory without  difficulty. Respirations not labored RA.  Alopecia  HEENT:PERRL, sclerae not icteric. Oral mucosa moist without lesions, posterior pharynx clear. TMs  clear with good light reflex bilaterally. Left upper posterior nasal septum minimal erythema, no active bleeding. No rhinorrhea.  Neck supple. No JVD.  Lymphatics:no cervical,supraclavicular or inguinal adenopathy Resp: clear to auscultation bilaterally and normal percussion bilaterally Cardio: regular rate and rhythm. No gallop. Clear heart sounds FF:[id number] soft, nontender, not distended, no mass or organomegaly. Normally active bowel sounds. Surgical incision not remarkable. Musculoskeletal/ Extremities: without pitting edema, cords, tenderness Neuro: no significant peripheral neuropathy. Otherwise nonfocal. PSYCH appropriate mood and affect Skin without rash, ecchymosis, petechiae Portacath-without erythema or tenderness  Lab Results:  Results for orders placed or performed in visit on 08/23/14  CBC with Differential  Result Value Ref Range   WBC 5.6 3.9 - 10.3 10e3/uL   NEUT# 3.4 1.5 - 6.5 10e3/uL   HGB 9.3 (L) 11.6 - 15.9 g/dL   HCT 29.0 (L) 34.8 - 46.6 %   Platelets 439 (H) 145 - 400 10e3/uL   MCV 108.9 (H) 79.5 - 101.0 fL   MCH 34.9 (H) 25.1 - 34.0 pg   MCHC 32.0 31.5 - 36.0 g/dL   RBC 2.66 (L) 3.70 - 5.45 10e6/uL   RDW 14.3 11.2 - 14.5 %   lymph# 1.6 0.9 - 3.3 10e3/uL   MONO# 0.5 0.1 -  0.9 10e3/uL   Eosinophils Absolute 0.2 0.0 - 0.5 10e3/uL   Basophils Absolute 0.0 0.0 - 0.1 10e3/uL   NEUT% 59.7 38.4 - 76.8 %   LYMPH% 27.8 14.0 - 49.7 %   MONO% 8.6 0.0 - 14.0 %   EOS% 3.2 0.0 - 7.0 %   BASO% 0.7 0.0 - 2.0 %  Iron and TIBC  Result Value Ref Range   Iron 43 41 - 142 ug/dL   TIBC 193 (L) 236 - 444 ug/dL   UIBC 150 120 - 384 ug/dL   %SAT 22 21 - 57 %  Ferritin  Result Value Ref Range   Ferritin 429 (H) 9 - 269 ng/ml  Comprehensive metabolic panel (Cmet) - [facility]  Result Value Ref Range   Sodium 142 136 - 145 mEq/L   Potassium 4.3 3.5 - 5.1 mEq/L   Chloride 104 98 - 109 mEq/L   CO2 27 22 - 29 mEq/L   Glucose 83 70 - 140 mg/dl   BUN 18.5 7.0 - 26.0 mg/dL    Creatinine 0.7 0.6 - 1.1 mg/dL   Total Bilirubin 0.58 0.20 - 1.20 mg/dL   Alkaline Phosphatase 182 (H) 40 - 150 U/L   AST 28 5 - 34 U/L   ALT 41 0 - 55 U/L   Total Protein 8.4 (H) 6.4 - 8.3 g/dL   Albumin 2.5 (L) 3.5 - 5.0 g/dL   Calcium 9.6 8.4 - 10.4 mg/dL   Anion Gap 10 3 - 11 mEq/L   EGFR >90 >90 ml/min/1.73 m2     Studies/Results:   CT ABDOMEN AND PELVIS WITH CONTRAST   08-15-2014 TECHNIQUE: Multidetector CT imaging of the abdomen and pelvis was performed using the standard protocol following bolus administration of intravenous contrast.  CONTRAST: 17m OMNIPAQUE IOHEXOL 300 MG/ML SOLN  COMPARISON: 03/07/2014 CT from [facility]. Chest CT of 03/25/2014 from [facility]  FINDINGS: Lower chest: 6 mm lingular nodule on image 5 measured 5 mm on the prior chest CT. Felt to be similar. Left base atelectasis. A 3 mm right lower lobe pulmonary nodule on image 6 is also felt to be similar. Mild cardiomegaly with similar small pericardial effusion. New left greater than right pleural effusions. Small.  Hepatobiliary: Focal steatosis adjacent the falciform ligament. Gallbladder decompressed. No biliary ductal dilatation.  Pancreas: Normal, without mass or ductal dilatation.  Spleen: Normal  Adrenals/Urinary Tract: Normal adrenal glands. Bilateral renal cysts. No hydronephrosis. Normal urinary bladder.  Stomach/Bowel: Normal stomach, without wall thickening. Colonic stool burden suggests constipation. Normal terminal ileum and appendix. Normal small bowel.  Vascular/Lymphatic: There is soft tissue density surrounding the aorta at the level of the diaphragmatic hiatus. This is new. Example image 15 of series 2. Normal caliber of the aorta and branch vessels.  11 mm left periaortic retroperitoneal low-density structure is suspicious for a necrotic lymph node. This is decreased from 1.3 cm on the prior exam (image 26 of that study). No pelvic  adenopathy.  Reproductive: Interval hysterectomy. No adnexal mass.  Other: No significant free fluid. No evidence of omental or peritoneal disease.  Musculoskeletal: Mild left hip osteoarthritis. Disc bulges at multiple lumbar levels.  IMPRESSION: 1. Interval hysterectomy. 2. Retroperitoneal low-density structures which are suspicious for necrotic adenopathy/metastasis. Left periaortic component is slightly decreased in size. However, there is new soft tissue thickening about the abdominal aorta more superiorly. Less likely differential considerations include benign lymphangioma/lymphangiomas and/or concurrent vasculitis. Consider PET for further evaluation. 3. Development of bilateral pleural effusions with left  base atelectasis. 4. Indeterminate bibasilar pulmonary nodules, felt to be similar. Consider further evaluation with chest CT, as recommended on 03/25/2014. 5. Possible constipation.  Medications: I have reviewed the patient's current medications.  DISCUSSION All of interval history discussed.  Elevated bilirubin and LFTs do not appear related to chemotherapy, no obvious etiology by CT, may have been from azithromycin, all resolved. Drop in hemoglobin likely with rehydration and hematuria, not obviously hemolysis, not symptomatic today; iron low normal after visit and will suggest oral iron.  Hematuria etiology not clear, with no obvious nephrolithiasis and culture negative, does have long tobacco use, and she agrees to urology referral.  Discussed possible adenopathy on CT, which appears different from diagnostic CT 02-2014 and from operative note. I have told patient that we could do PET now or certainly should have repeat imaging after completes planned 6 cycles of this chemotherapy. She does not request additional imaging now. We have discussed usefulness of taxanes with gyn cancers, related problem of peripheral neuropathy and considerations for continuing now. As  the peripheral neuropathy in feet seems fairly minimal and not very symptomatic, she is in favor of continuing.  She understands that she can transfer care back to Dr Sabra Heck or other if she prefers. She requests treatment here with chemotherapy on 08-24-14 as planned and agrees with follow up at this office after that treatment. I have explained that various staff at [facility] may be involved in her care here. She understands that, despite best efforts, chemotherapy has risks.   I confirmed that she has not used oral premedication decadron with taxol to date. She agrees to [employer] as previously.  Assessment/Plan:  1.IIIC high grade serous endometrial carcinoma: Post optimal debulking by Dr Sheena Caldwell in [place] 04-06-14 and 3 cycles of adjuvant taxol carboplatin from 05-05-14 thru 07-29-13. Question of retroperitoneal and para-aortic adenopathy by CT 08-15-14. By exam and labs she is stable to continue chemotherapy with cycle 4 on 08-24-14, with neulasta support. I will see her on 3-14 and on 3-18 with labs. 2.PAC in 3.elevated bilirubin and LFTs noted day 15 cycle 3: possibly related to azithromycin. Chemistries normal with exception of slight elevation AP now. Follow. 4.blood in urine 08-19-14, culture negative, no obvious nephrolithiasis by CT. Refer to urology.  5.anemia: macrocytic, B12 pending, some ETOH. Iron low normal, will add iron supplements likely with next MD visit. Likely multifactorial including blood in urine, chemo etc.  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 Restless legs with benadryl. Premed benadryl with chemo 25 mg, po or IV. 13. No acute dental problems, no dentist. 14.baseline low BP  All questions answered to satisfaction of patient and aunt, both  of whom seemed to follow conversation well. Verbal consent for chemo given. Chemo and neulasta orders confirmed.  Time spent 45+ min including >50% counseling and coordination of care. Patient and aunt expressed appreciation for visit.   Gordy Levan, MD   08/23/2014, 12:48 PM

## 2014-08-23 NOTE — Telephone Encounter (Signed)
appts made and avs printed for pt,called [facility] and[facility] they have her apt information but the pt has to call her pcp on her medicaid card and they will do a ref and make her appt,pt aware       Sheena Caldwell

## 2014-08-23 NOTE — Telephone Encounter (Signed)
Called Alexis and she is agreeable to see patient tomorrow in infusion room during chemo treatment. Called patient as well to let her know this and she is very appreciative and states she looks forward to seeing [facility].

## 2014-08-23 NOTE — Telephone Encounter (Signed)
Patient agreeable to move appt on Monday, 08-29-14 to earlier time. Patient states "I prefer early morning anyway." Patient wrote down new appt times and is agreeable to arrive for 8:30am lab.

## 2014-08-24 ENCOUNTER — Ambulatory Visit (HOSPITAL_BASED_OUTPATIENT_CLINIC_OR_DEPARTMENT_OTHER): Payer: Medicaid Other

## 2014-08-24 ENCOUNTER — Ambulatory Visit: Payer: Medicaid Other | Admitting: Nurse Practitioner

## 2014-08-24 ENCOUNTER — Encounter: Payer: Self-pay | Admitting: *Deleted

## 2014-08-24 ENCOUNTER — Other Ambulatory Visit: Payer: Medicaid Other

## 2014-08-24 DIAGNOSIS — C541 Malignant neoplasm of endometrium: Secondary | ICD-10-CM

## 2014-08-24 DIAGNOSIS — Z5111 Encounter for antineoplastic chemotherapy: Secondary | ICD-10-CM

## 2014-08-24 MED ORDER — FAMOTIDINE IN NACL 20-0.9 MG/50ML-% IV SOLN
INTRAVENOUS | Status: AC
  Filled 2014-08-24: qty 50

## 2014-08-24 MED ORDER — SODIUM CHLORIDE 0.9 % IV SOLN: 20.0000 mg | Freq: Once | INTRAVENOUS | Status: DC

## 2014-08-24 MED ORDER — DIPHENHYDRAMINE HCL 50 MG/ML IJ SOLN
INTRAMUSCULAR | Status: AC
  Filled 2014-08-24: qty 1

## 2014-08-24 MED ORDER — DIPHENHYDRAMINE HCL 50 MG/ML IJ SOLN
25.0000 mg | Freq: Once | INTRAMUSCULAR | Status: AC
  Administered 2014-08-24: 25 mg via INTRAVENOUS

## 2014-08-24 MED ORDER — SODIUM CHLORIDE 0.9 % IV SOLN
Freq: Once | INTRAVENOUS | Status: AC
  Administered 2014-08-24: 13:00:00 via INTRAVENOUS
  Filled 2014-08-24: qty 8

## 2014-08-24 MED ORDER — HEPARIN SOD (PORK) LOCK FLUSH 100 UNIT/ML IV SOLN
500.0000 [IU] | Freq: Once | INTRAVENOUS | Status: AC | PRN
  Administered 2014-08-24: 500 [IU]
  Filled 2014-08-24: qty 5

## 2014-08-24 MED ORDER — SODIUM CHLORIDE 0.9 % IJ SOLN
10.0000 mL | INTRAMUSCULAR | Status: DC | PRN
Start: 2014-08-24 — End: 2014-08-24
  Administered 2014-08-24: 10 mL
  Filled 2014-08-24: qty 10

## 2014-08-24 MED ORDER — SODIUM CHLORIDE 0.9 % IV SOLN
Freq: Once | INTRAVENOUS | Status: AC
  Administered 2014-08-24: 12:00:00 via INTRAVENOUS

## 2014-08-24 MED ORDER — SODIUM CHLORIDE 0.9 % IV SOLN
600.0000 mg | Freq: Once | INTRAVENOUS | Status: AC
  Administered 2014-08-24: 600 mg via INTRAVENOUS
  Filled 2014-08-24: qty 60

## 2014-08-24 MED ORDER — DEXTROSE 5 % IV SOLN
175.0000 mg/m2 | Freq: Once | INTRAVENOUS | Status: AC
  Administered 2014-08-24: 300 mg via INTRAVENOUS
  Filled 2014-08-24: qty 50

## 2014-08-24 MED ORDER — FAMOTIDINE IN NACL 20-0.9 MG/50ML-% IV SOLN
20.0000 mg | Freq: Once | INTRAVENOUS | Status: AC
  Administered 2014-08-24: 20 mg via INTRAVENOUS

## 2014-08-24 NOTE — Patient Instructions (Addendum)
drug may decrease the number of white blood cells, red blood cells and platelets. You may be at increased risk for infections and bleeding. -signs of infection - fever or chills, cough, sore throat, pain or difficulty passing urine -signs of decreased platelets or bleeding - bruising, pinpoint red spots on the skin, black, tarry stools, nosebleeds -signs of decreased red blood cells - unusually weak or tired, fainting spells, lightheadedness -breathing problems -chest pain -high or low blood pressure -mouth sores -nausea and vomiting -pain, swelling, redness or irritation at the injection site -pain, tingling, numbness in the hands or feet -slow or irregular heartbeat -swelling of the ankle, feet, hands Side effects that usually do not require medical attention (report to your doctor or health care professional if they continue or are bothersome): -bone pain -complete hair loss including hair on your head, underarms, pubic hair, eyebrows, and eyelashes -changes in the color of fingernails -diarrhea -loosening of the fingernails -loss of appetite -muscle or joint pain -red flush to skin -sweating This list may not describe all possible side effects. Call your doctor for medical advice about side effects. You may report side effects to FDA at 1-800-FDA-1088. Where should I keep my medicine? This drug is given in a hospital or clinic and will not be stored at home. NOTE: This sheet is a summary. It may not cover all possible information. If you have questions about this medicine, talk to your doctor, pharmacist, or health care provider.  2015, Elsevier/Gold Standard. (2012-07-27 16:41:21) Carboplatin injection What is this medicine? CARBOPLATIN (KAR boe pla tin) is a chemotherapy drug. It targets fast dividing cells, like cancer cells, and causes these cells to die. This medicine is used to treat ovarian cancer and many other cancers. This medicine may be used for other purposes; ask  your health care provider or pharmacist if you have questions. COMMON BRAND NAME(S): Paraplatin What should I tell my health care provider before I take this medicine? They need to know if you have any of these conditions: -blood disorders -hearing problems -kidney disease -recent or ongoing radiation therapy -an unusual or allergic reaction to carboplatin, cisplatin, other chemotherapy, other medicines, foods, dyes, or preservatives -pregnant or trying to get pregnant -breast-feeding How should I use this medicine? This drug is usually given as an infusion into a vein. It is administered in a hospital or clinic by a specially trained health care professional. Talk to your pediatrician regarding the use of this medicine in children. Special care may be needed. Overdosage: If you think you have taken too much of this medicine contact a poison control center or emergency room at once. NOTE: This medicine is only for you. Do not share this medicine with others. What if I miss a dose? It is important not to miss a dose. Call your doctor or health care professional if you are unable to keep an appointment. What may interact with this medicine? -medicines for seizures -medicines to increase blood counts like filgrastim, pegfilgrastim, sargramostim -some antibiotics like amikacin, gentamicin, neomycin, streptomycin, tobramycin -vaccines Talk to your doctor or health care professional before taking any of these medicines: -acetaminophen -aspirin -ibuprofen -ketoprofen -naproxen This list may not describe all possible interactions. Give your health care provider a list of all the medicines, herbs, non-prescription drugs, or dietary supplements you use. Also tell them if you smoke, drink alcohol, or use illegal drugs. Some items may interact with your medicine. What should I watch for while using this medicine? Your condition will be  monitored carefully while you are receiving this medicine. You  will need important blood work done while you are taking this medicine. This drug may make you feel generally unwell. This is not uncommon, as chemotherapy can affect healthy cells as well as cancer cells. Report any side effects. Continue your course of treatment even though you feel ill unless your doctor tells you to stop. In some cases, you may be given additional medicines to help with side effects. Follow all directions for their use. Call your doctor or health care professional for advice if you get a fever, chills or sore throat, or other symptoms of a cold or flu. Do not treat yourself. This drug decreases your body's ability to fight infections. Try to avoid being around people who are sick. This medicine may increase your risk to bruise or bleed. Call your doctor or health care professional if you notice any unusual bleeding. Be careful brushing and flossing your teeth or using a toothpick because you may get an infection or bleed more easily. If you have any dental work done, tell your dentist you are receiving this medicine. Avoid taking products that contain aspirin, acetaminophen, ibuprofen, naproxen, or ketoprofen unless instructed by your doctor. These medicines may hide a fever. Do not become pregnant while taking this medicine. Women should inform their doctor if they wish to become pregnant or think they might be pregnant. There is a potential for serious side effects to an unborn child. Talk to your health care professional or pharmacist for more information. Do not breast-feed an infant while taking this medicine. What side effects may I notice from receiving this medicine? Side effects that you should report to your doctor or health care professional as soon as possible: -allergic reactions like skin rash, itching or hives, swelling of the face, lips, or tongue -signs of infection - fever or chills, cough, sore throat, pain or difficulty passing urine -signs of decreased platelets  or bleeding - bruising, pinpoint red spots on the skin, black, tarry stools, nosebleeds -signs of decreased red blood cells - unusually weak or tired, fainting spells, lightheadedness -breathing problems -changes in hearing -changes in vision -chest pain -high blood pressure -low blood counts - This drug may decrease the number of white blood cells, red blood cells and platelets. You may be at increased risk for infections and bleeding. -nausea and vomiting -pain, swelling, redness or irritation at the injection site -pain, tingling, numbness in the hands or feet -problems with balance, talking, walking -trouble passing urine or change in the amount of urine Side effects that usually do not require medical attention (report to your doctor or health care professional if they continue or are bothersome): -hair loss -loss of appetite -metallic taste in the mouth or changes in taste This list may not describe all possible side effects. Call your doctor for medical advice about side effects. You may report side effects to FDA at 1-800-FDA-1088. Where should I keep my medicine? This drug is given in a hospital or clinic and will not be stored at home. NOTE: This sheet is a summary. It may not cover all possible information. If you have questions about this medicine, talk to your doctor, pharmacist, or health care provider.  2015, Elsevier/Gold Standard. (2007-09-08 14:38:05) Paclitaxel injection What is this medicine? PACLITAXEL (PAK li TAX el) is a chemotherapy drug. It targets fast dividing cells, like cancer cells, and causes these cells to die. This medicine is used to treat ovarian cancer, breast cancer, and  other cancers. This medicine may be used for other purposes; ask your health care provider or pharmacist if you have questions. COMMON BRAND NAME(S): Onxol, Taxol What should I tell my health care provider before I take this medicine? They need to know if you have any of these  conditions: -blood disorders -irregular heartbeat -infection (especially a virus infection such as chickenpox, cold sores, or herpes) -liver disease -previous or ongoing radiation therapy -an unusual or allergic reaction to paclitaxel, alcohol, polyoxyethylated castor oil, other chemotherapy agents, other medicines, foods, dyes, or preservatives -pregnant or trying to get pregnant -breast-feeding How should I use this medicine? This drug is given as an infusion into a vein. It is administered in a hospital or clinic by a specially trained health care professional. Talk to your pediatrician regarding the use of this medicine in children. Special care may be needed. Overdosage: If you think you have taken too much of this medicine contact a poison control center or emergency room at once. NOTE: This medicine is only for you. Do not share this medicine with others. What if I miss a dose? It is important not to miss your dose. Call your doctor or health care professional if you are unable to keep an appointment. What may interact with this medicine? Do not take this medicine with any of the following medications: -disulfiram -metronidazole This medicine may also interact with the following medications: -cyclosporine -diazepam -ketoconazole -medicines to increase blood counts like filgrastim, pegfilgrastim, sargramostim -other chemotherapy drugs like cisplatin, doxorubicin, epirubicin, etoposide, teniposide, vincristine -quinidine -testosterone -vaccines -verapamil Talk to your doctor or health care professional before taking any of these medicines: -acetaminophen -aspirin -ibuprofen -ketoprofen -naproxen This list may not describe all possible interactions. Give your health care provider a list of all the medicines, herbs, non-prescription drugs, or dietary supplements you use. Also tell them if you smoke, drink alcohol, or use illegal drugs. Some items may interact with your  medicine. What should I watch for while using this medicine? Your condition will be monitored carefully while you are receiving this medicine. You will need important blood work done while you are taking this medicine. This drug may make you feel generally unwell. This is not uncommon, as chemotherapy can affect healthy cells as well as cancer cells. Report any side effects. Continue your course of treatment even though you feel ill unless your doctor tells you to stop. In some cases, you may be given additional medicines to help with side effects. Follow all directions for their use. Call your doctor or health care professional for advice if you get a fever, chills or sore throat, or other symptoms of a cold or flu. Do not treat yourself. This drug decreases your body's ability to fight infections. Try to avoid being around people who are sick. This medicine may increase your risk to bruise or bleed. Call your doctor or health care professional if you notice any unusual bleeding. Be careful brushing and flossing your teeth or using a toothpick because you may get an infection or bleed more easily. If you have any dental work done, tell your dentist you are receiving this medicine. Avoid taking products that contain aspirin, acetaminophen, ibuprofen, naproxen, or ketoprofen unless instructed by your doctor. These medicines may hide a fever. Do not become pregnant while taking this medicine. Women should inform their doctor if they wish to become pregnant or think they might be pregnant. There is a potential for serious side effects to an unborn child. Talk to  your health care professional or pharmacist for more information. Do not breast-feed an infant while taking this medicine. Men are advised not to father a child while receiving this medicine. What side effects may I notice from receiving this medicine? Side effects that you should report to your doctor or health care professional as soon as  possible: -allergic reactions like skin rash, itching or hives, swelling of the face, lips, or tongue -low blood counts - This drug may decrease the number of white blood cells, red blood cells and platelets. You may be at increased risk for infections and bleeding. -signs of infection - fever or chills, cough, sore throat, pain or difficulty passing urine -signs of decreased platelets or bleeding - bruising, pinpoint red spots on the skin, black, tarry stools, nosebleeds -signs of decreased red blood cells - unusually weak or tired, fainting spells, lightheadedness -breathing problems -chest pain -high or low blood pressure -mouth sores -nausea and vomiting -pain, swelling, redness or irritation at the injection site -pain, tingling, numbness in the hands or feet -slow or irregular heartbeat -swelling of the ankle, feet, hands Side effects that usually do not require medical attention (report to your doctor or health care professional if they continue or are bothersome): -bone pain -complete hair loss including hair on your head, underarms, pubic hair, eyebrows, and eyelashes -changes in the color of fingernails -diarrhea -loosening of the fingernails -loss of appetite -muscle or joint pain -red flush to skin -sweating This list may not describe all possible side effects. Call your doctor for medical advice about side effects. You may report side effects to FDA at 1-800-FDA-1088. Where should I keep my medicine? This drug is given in a hospital or clinic and will not be stored at home. NOTE: This sheet is a summary. It may not cover all possible information. If you have questions about this medicine, talk to your doctor, pharmacist, or health care provider.  2015, Elsevier/Gold Standard. (2012-07-27 16:41:21)

## 2014-08-24 NOTE — Progress Notes (Signed)
Vitals checked at start of taxol.

## 2014-08-24 NOTE — Progress Notes (Signed)
[place] to verify treatment regimen.  Label does not note first Taxol.  Patient reports this is not her first but fourth Taxol.  [place] With most recent treatment week of August 08, 2014.  Okay to proceed with taxol over three hours with no first time taxane protocol.    DURING TREATMENT REPORTS RESTLESS LEGS.  WALKED AROUND TREATMENT AREA WITH SOME RELIEF.  REPORTS WHEN SHE WAS IN [place], THE STAFF LET THE BENADRYL INFUSE SLOWLY.

## 2014-08-25 ENCOUNTER — Telehealth: Payer: Self-pay | Admitting: *Deleted

## 2014-08-25 ENCOUNTER — Other Ambulatory Visit: Payer: Self-pay | Admitting: Oncology

## 2014-08-25 ENCOUNTER — Encounter: Payer: Self-pay | Admitting: Specialist

## 2014-08-25 NOTE — Telephone Encounter (Signed)
Returned call to patient - let her know that per Dr. Mariana Kaufman last office visit note that she would like to see patient on both 3-14 and 3-18 after her last chemo. Told patient that if she has any further concerns regarding two appts in one week to discuss with Dr. Marko Plume on 3-14. Patient agreeable to this.

## 2014-08-25 NOTE — Telephone Encounter (Signed)
Spoke with patient. Dr Marko Plume has placed orders for infusion appt to be changed- will have call from schedulers with new time. Dr Marko Plume sent a message to [place] to contact her. Dr Marko Plume asked desk nurse to provide information to Dr Almedia Balls for referral to take place.

## 2014-08-25 NOTE — Progress Notes (Signed)
Spoke with patient by phone. We are scheduled to meet in my office Monday at 10:00 after she meets with Dr. Marko Plume.  Sheena Gore, PhD, [place]

## 2014-08-25 NOTE — Telephone Encounter (Signed)
Information faxed to Dr. Lesly Rubenstein office as noted below by Dr. Marko Plume. Called and confirmed Dr. Lesly Rubenstein did receive records.

## 2014-08-25 NOTE — Telephone Encounter (Signed)
-----   Message from Gordy Levan, MD sent at 08/25/2014  8:48 AM EST ----- Patient call note from [place], in triage. I am having trouble replying to these notes still, waiting on Mervyn Skeeters, so answering this way  1.chemo should be every 3 weeks. POF done by MD now to move next treatment to 4-1 2.patient wants to speak with chaplain. I have sent message asking Ubaldo Glassing to contact her 3.apparently her insurance needs PCP Suzann Hedgecock to do referral to urology. Please send my last note to Dr Almedia Balls, UA and culture results and CT. I use [facility] mostly, any MD there, but am fine for other urologist if that Md prefers. 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 Dr Hedgecock's office phone is [phone] ([place])  thanks

## 2014-08-25 NOTE — Telephone Encounter (Signed)
Pt states her PCP, Ardith Dark, MD cannot make referral to Urology until she knows name of MD and reason for referral.  Pt is also questioning why her chemo is scheduled for 3/25 as she thought she was supposed to have chemo every 3 weeks. Would prefer to do chemo ~ 4/1.  Fredia Sorrow did not go to see patient while she was in chemo on 3/8 or 3/9. Would like to speak with her.

## 2014-08-25 NOTE — Telephone Encounter (Signed)
ONE SET OF APPOINTMENTS IS ON 08/29/14 THE OTHER SET OF APPOINTMENTS IS ON 09/02/14. PT. WOULD LIKE A CALL FROM DR.LIVESAY'S NURSE. THIS NOTE WAS SENT TO [place] [place].

## 2014-08-25 NOTE — Telephone Encounter (Signed)
No signs/symptoms after chemotherapy. RN instructed patient to call with any question. Patient verbalized understanding.

## 2014-08-26 ENCOUNTER — Ambulatory Visit (HOSPITAL_BASED_OUTPATIENT_CLINIC_OR_DEPARTMENT_OTHER): Payer: Medicaid Other

## 2014-08-26 DIAGNOSIS — C541 Malignant neoplasm of endometrium: Secondary | ICD-10-CM

## 2014-08-26 DIAGNOSIS — Z5189 Encounter for other specified aftercare: Secondary | ICD-10-CM

## 2014-08-26 MED ORDER — PEGFILGRASTIM INJECTION 6 MG/0.6ML
6.0000 mg | Freq: Once | SUBCUTANEOUS | Status: AC
  Administered 2014-08-26: 6 mg via SUBCUTANEOUS
  Filled 2014-08-26: qty 0.6

## 2014-08-26 NOTE — Patient Instructions (Signed)
Pegfilgrastim injection What is this medicine? PEGFILGRASTIM (peg fil GRA stim) is a long-acting granulocyte colony-stimulating factor that stimulates the growth of neutrophils, a type of white blood cell important in the body's fight against infection. It is used to reduce the incidence of fever and infection in patients with certain types of cancer who are receiving chemotherapy that affects the bone marrow. This medicine may be used for other purposes; ask your health care provider or pharmacist if you have questions. COMMON BRAND NAME(S): Neulasta What should I tell my health care provider before I take this medicine? They need to know if you have any of these conditions: -latex allergy -ongoing radiation therapy -sickle cell disease -skin reactions to acrylic adhesives (On-Body Injector only) -an unusual or allergic reaction to pegfilgrastim, filgrastim, other medicines, foods, dyes, or preservatives -pregnant or trying to get pregnant -breast-feeding How should I use this medicine? This medicine is for injection under the skin. If you get this medicine at home, you will be taught how to prepare and give the pre-filled syringe or how to use the On-body Injector. Refer to the patient Instructions for Use for detailed instructions. Use exactly as directed. Take your medicine at regular intervals. Do not take your medicine more often than directed. It is important that you put your used needles and syringes in a special sharps container. Do not put them in a trash can. If you do not have a sharps container, call your pharmacist or healthcare provider to get one. Talk to your pediatrician regarding the use of this medicine in children. Special care may be needed. Overdosage: If you think you have taken too much of this medicine contact a poison control center or emergency room at once. NOTE: This medicine is only for you. Do not share this medicine with others. What if I miss a dose? It is  important not to miss your dose. Call your doctor or health care professional if you miss your dose. If you miss a dose due to an On-body Injector failure or leakage, a new dose should be administered as soon as possible using a single prefilled syringe for manual use. What may interact with this medicine? Interactions have not been studied. Give your health care provider a list of all the medicines, herbs, non-prescription drugs, or dietary supplements you use. Also tell them if you smoke, drink alcohol, or use illegal drugs. Some items may interact with your medicine. This list may not describe all possible interactions. Give your health care provider a list of all the medicines, herbs, non-prescription drugs, or dietary supplements you use. Also tell them if you smoke, drink alcohol, or use illegal drugs. Some items may interact with your medicine. What should I watch for while using this medicine? You may need blood work done while you are taking this medicine. If you are going to need a MRI, CT scan, or other procedure, tell your doctor that you are using this medicine (On-Body Injector only). What side effects may I notice from receiving this medicine? Side effects that you should report to your doctor or health care professional as soon as possible: -allergic reactions like skin rash, itching or hives, swelling of the face, lips, or tongue -dizziness -fever -pain, redness, or irritation at site where injected -pinpoint red spots on the skin -shortness of breath or breathing problems -stomach or side pain, or pain at the shoulder -swelling -tiredness -trouble passing urine Side effects that usually do not require medical attention (report to your doctor   or health care professional if they continue or are bothersome): -bone pain -muscle pain This list may not describe all possible side effects. Call your doctor for medical advice about side effects. You may report side effects to FDA at  1-800-FDA-1088. Where should I keep my medicine? Keep out of the reach of children. Store pre-filled syringes in a refrigerator between 2 and 8 degrees C (36 and 46 degrees F). Do not freeze. Keep in carton to protect from light. Throw away this medicine if it is left out of the refrigerator for more than 48 hours. Throw away any unused medicine after the expiration date. NOTE: This sheet is a summary. It may not cover all possible information. If you have questions about this medicine, talk to your doctor, pharmacist, or health care provider.  2015, Elsevier/Gold Standard. (2013-09-02 16:14:05)  

## 2014-08-29 ENCOUNTER — Other Ambulatory Visit (HOSPITAL_BASED_OUTPATIENT_CLINIC_OR_DEPARTMENT_OTHER): Payer: Medicaid Other

## 2014-08-29 ENCOUNTER — Encounter: Payer: Self-pay | Admitting: Oncology

## 2014-08-29 ENCOUNTER — Telehealth: Payer: Self-pay | Admitting: Oncology

## 2014-08-29 ENCOUNTER — Ambulatory Visit: Payer: Medicaid Other | Admitting: Oncology

## 2014-08-29 ENCOUNTER — Ambulatory Visit (HOSPITAL_BASED_OUTPATIENT_CLINIC_OR_DEPARTMENT_OTHER): Payer: Medicaid Other | Admitting: Oncology

## 2014-08-29 ENCOUNTER — Other Ambulatory Visit: Payer: Medicaid Other

## 2014-08-29 VITALS — BP 97/61 | HR 93 | Temp 97.7°F | Resp 18 | Ht 66.0 in | Wt 132.7 lb

## 2014-08-29 DIAGNOSIS — Z95828 Presence of other vascular implants and grafts: Secondary | ICD-10-CM

## 2014-08-29 DIAGNOSIS — D539 Nutritional anemia, unspecified: Secondary | ICD-10-CM

## 2014-08-29 DIAGNOSIS — K5909 Other constipation: Secondary | ICD-10-CM

## 2014-08-29 DIAGNOSIS — Z720 Tobacco use: Secondary | ICD-10-CM

## 2014-08-29 DIAGNOSIS — R319 Hematuria, unspecified: Secondary | ICD-10-CM

## 2014-08-29 DIAGNOSIS — G620 Drug-induced polyneuropathy: Secondary | ICD-10-CM

## 2014-08-29 DIAGNOSIS — T451X5A Adverse effect of antineoplastic and immunosuppressive drugs, initial encounter: Secondary | ICD-10-CM

## 2014-08-29 DIAGNOSIS — C541 Malignant neoplasm of endometrium: Secondary | ICD-10-CM

## 2014-08-29 DIAGNOSIS — G622 Polyneuropathy due to other toxic agents: Secondary | ICD-10-CM

## 2014-08-29 DIAGNOSIS — R112 Nausea with vomiting, unspecified: Secondary | ICD-10-CM

## 2014-08-29 DIAGNOSIS — F1910 Other psychoactive substance abuse, uncomplicated: Secondary | ICD-10-CM

## 2014-08-29 DIAGNOSIS — D701 Agranulocytosis secondary to cancer chemotherapy: Secondary | ICD-10-CM

## 2014-08-29 LAB — COMPREHENSIVE METABOLIC PANEL (CC13)
ALBUMIN: 2.9 g/dL — AB (ref 3.5–5.0)
ALT: 29 U/L (ref 0–55)
AST: 20 U/L (ref 5–34)
Alkaline Phosphatase: 181 U/L — ABNORMAL HIGH (ref 40–150)
Anion Gap: 10 mEq/L (ref 3–11)
BILIRUBIN TOTAL: 0.64 mg/dL (ref 0.20–1.20)
BUN: 13.5 mg/dL (ref 7.0–26.0)
CO2: 28 meq/L (ref 22–29)
CREATININE: 0.8 mg/dL (ref 0.6–1.1)
Calcium: 9.6 mg/dL (ref 8.4–10.4)
Chloride: 102 mEq/L (ref 98–109)
EGFR: >90 mL/min/{1.73_m2} (ref >90)
Glucose: 117 mg/dl (ref 70–140)
Potassium: 4.3 mEq/L (ref 3.5–5.1)
Sodium: 141 mEq/L (ref 136–145)
TOTAL PROTEIN: 8.5 g/dL — AB (ref 6.4–8.3)

## 2014-08-29 LAB — CBC WITH DIFFERENTIAL/PLATELET
BASO%: 0.2 % (ref 0.0–2.0)
Basophils Absolute: 0.1 10*3/uL (ref 0.0–0.1)
EOS ABS: 0.1 10*3/uL (ref 0.0–0.5)
EOS%: 0.2 % (ref 0.0–7.0)
HCT: 31.4 % — ABNORMAL LOW (ref 34.8–46.6)
HGB: 10.2 g/dL — ABNORMAL LOW (ref 11.6–15.9)
LYMPH#: 1.1 10*3/uL (ref 0.9–3.3)
LYMPH%: 3.8 % — AB (ref 14.0–49.7)
MCH: 35.4 pg — ABNORMAL HIGH (ref 25.1–34.0)
MCHC: 32.5 g/dL (ref 31.5–36.0)
MCV: 109 fL — ABNORMAL HIGH (ref 79.5–101.0)
MONO#: 0.2 10*3/uL (ref 0.1–0.9)
MONO%: 0.6 % (ref 0.0–14.0)
NEUT#: 28.3 10*3/uL — ABNORMAL HIGH (ref 1.5–6.5)
NEUT%: 95.2 % — ABNORMAL HIGH (ref 38.4–76.8)
Platelets: 353 10*3/uL (ref 145–400)
RBC: 2.88 10*6/uL — AB (ref 3.70–5.45)
RDW: 13.9 % (ref 11.2–14.5)
WBC: 29.7 10*3/uL — AB (ref 3.9–10.3)

## 2014-08-29 NOTE — Patient Instructions (Signed)
Take miralax today. Use this daily as needed to keep bowels moving daily   You may not need ondansetron on regular basis now, but certainly can use if any nausea  Call Dr Mariana Kaufman RN on Thurs 3-17 if you are feeling well enough that you do not need to be seen on 3-18.  [phone]

## 2014-08-29 NOTE — Telephone Encounter (Signed)
appts made  And pt called with new sch as i had to verify q3weeks for chemo and per dr ll ok to sch next visit as well to get on sch  anne

## 2014-08-29 NOTE — Progress Notes (Signed)
OFFICE PROGRESS NOTE   August 29, 2014   [place] Car[place] Fila Hedgecock(PCP, [facility] [employer]), _ [name] (gyn [place]  INTERVAL HISTORY:   Patient is seen, alone for visit, in follow up of first chemotherapy given at this office on 08-24-14 for IIIC1 high grade serous endometrial carcinoma, this chemotherapy treatment cycle 4 of taxol carboplatin for the patient. She had neulasta on 08-26-14.  Situation after cycle 3, and just after transfer of care to this office, was complicated by abdominal symptoms, elevated LFTs and blood in urine. Aunt accompanied to office, but waited in lobby during visit.  Patient has tolerated this treatment reasonably well thus far. She has used zofran 8 mg every 8 hrs on regular basis, with nausea controlled; I have suggested in next day or so she may be able to change this to prn. She has not had a bowel movement in ~ 2-3 days, has not added miralax but will do that. She has been able to eat and to drink fluids well. She complains of some abdominal discomfort, which may be with the constipation, also did not fill protonix as yet. She has had no dark or bloody urine, and no other bladder symptoms now; she has not heard yet about urology appointment. She denies SOB or other pain now. She has had no fever. She notices tingling in distal feet, which does not interfere with ambulation, none in hands. No severe taxol or neulasta aches reported.  PAC in, functioned well for chemo Flu vaccine done  Oncology chaplain to see today, tho patient tells me she is doing better now and may not need that visit; I have encouraged her to meet chaplain as this may be helpful as she continues treatment.  ONCOLOGIC HISTORY Patient had been menopausal since age 39, then had one episode of vaginal bleeding early 2015. Later in 2015 she had persistent vaginal discharge, for which she was seen by PCP Brand Males (APP with  [place] at [place] [facility]) in ~ 02-2014, had CTs in [facility] (reports to be requested) and was referred to Dr Adella Nissen, gyn in [facility]. Endometrial biopsy had concern (that path not included in information available now) such that she was referred to Dr Jacquelyne Balint. Surgery by Dr Sabra Heck at [facility] in [place] on 04-06-14 was TAH, BSO, omentectomy, pelvic and right common iliac node evaluation; patient was hospitalized x 3 days and tells me that she recovered well from the surgery. Pathology [id number]) from 04-06-14 found high grade serous carcinoma involving entire thickness of myometrium (depth of invasion 1.1 cm out of 1.1 cm), with invasion of cervical stroma, no involvement of uterine serosa/bilateral tubes and ovaries/omentum, positive LVSI, 2/5 right pelvic nodes, 2 of 4 left pelvic nodes and 0/1 right common iliac node. Bulk of the carcinoma was in lower uterine segment where it was within 0.1 cm of serosal surface. Cytology positive for adenocarcinoma on washings ([id number]). Surgical findings were significant for no paraaortic adenopathy apparent. Patient has received 3 cycles of adjuvant chemotherapy in [facility] by Dr Sabra Heck, on 05-05-14, 05-26-14 and 07-29-14. Per records and patient, delay of cycle 3 was related to low counts and insurance changes. She received neulasta after chemotherapy on 07-29-14. Taxol was given at 175 mg/m2 for total dose was 280 mg cycle 1 and 291 mg for cycles 2 and 3; carboplatin was AUC = 6 with total dose 610 mg cycle 1, 620 mg cycle 2  and 630 mg cycle 3.. She does not recall using oral decadron premed for taxol, with premeds listed on chemo flowsheets standard decadron 20 mg, zofran 16 mg, benadryl 50 mg (which caused severe restless legs) and pepcid 20 mg. Outside information does not include serial blood counts. Last note from Dr Sabra Heck dated 07-29-14 describes unremarkable exam, "NED during chemotherapy and adequate PS". Patient was  see by Dr Denman George 08-09-14, who recommends restaging scans after 6 cycles of chemotherapy and consideration of vaginal brachytherapy; she has not been seen by radiation oncology in [place]. CT AP done in [facility] system 08-15-14 (due to elevated LFTs and blood in urine just after transfer of care) with soft tissue fullness at superior abdominal aorta and question of retroperitoneal necrotic adenopathy; plan repeat scans after complete present chemotherapy. Cycle 4 carbo taxol given 08-24-14 at [facility], with neulasta.  Review of systems as above, also: No other bleeding. No orthostatic symptoms. No complaints of jaw or ear discomfort. Has not used claritin, not aware of sinus drainage. Remainder of 10 point Review of Systems negative.  Objective:  Vital signs in last 24 hours:  BP 97/61 mmHg  Pulse 93  Temp(Src) 97.7 F (36.5 C) (Oral)  Resp 18  Ht 5' 6"  (1.676 m)  Wt 132 lb 11.2 oz (60.192 kg)  BMI 21.43 kg/m2  SpO2 99% From previous information, this BP appears to be in her usual range. Weight down 2 lbs. Looks comfortable, pleasant and cooperative, respirations not labored RA, smiling. Alert, oriented and appropriate. Ambulatory without difficulty.  Alopecia  HEENT:PERRL, sclerae not icteric. Oral mucosa moist without lesions, posterior pharynx clear.  Neck supple. No JVD.  Lymphatics:no cervical,suraclavicular or inguinal adenopathy Resp: clear to auscultation bilaterally and normal percussion bilaterally Cardio: regular rate and rhythm. No gallop. GI: soft, nontender, not distended, no mass or organomegaly. Normally active bowel sounds. Surgical incision not remarkable. Musculoskeletal/ Extremities: without pitting edema, cords, tenderness Neuro: peripheral neuropathy distal feet as noted. Otherwise nonfocal. PSYCH appropriate mood and affect Skin without rash, ecchymosis, petechiae Portacath-without erythema or tenderness  Lab Results:  Results for orders placed or performed in visit  on 08/29/14  CBC with Differential  Result Value Ref Range   WBC 29.7 (H) 3.9 - 10.3 10e3/uL   NEUT# 28.3 (H) 1.5 - 6.5 10e3/uL   HGB 10.2 (L) 11.6 - 15.9 g/dL   HCT 31.4 (L) 34.8 - 46.6 %   Platelets 353 145 - 400 10e3/uL   MCV 109.0 (H) 79.5 - 101.0 fL   MCH 35.4 (H) 25.1 - 34.0 pg   MCHC 32.5 31.5 - 36.0 g/dL   RBC 2.88 (L) 3.70 - 5.45 10e6/uL   RDW 13.9 11.2 - 14.5 %   lymph# 1.1 0.9 - 3.3 10e3/uL   MONO# 0.2 0.1 - 0.9 10e3/uL   Eosinophils Absolute 0.1 0.0 - 0.5 10e3/uL   Basophils Absolute 0.1 0.0 - 0.1 10e3/uL   NEUT% 95.2 (H) 38.4 - 76.8 %   LYMPH% 3.8 (L) 14.0 - 49.7 %   MONO% 0.6 0.0 - 14.0 %   EOS% 0.2 0.0 - 7.0 %   BASO% 0.2 0.0 - 2.0 %  Comprehensive metabolic panel (Cmet) - [facility]  Result Value Ref Range   Sodium 141 136 - 145 mEq/L   Potassium 4.3 3.5 - 5.1 mEq/L   Chloride 102 98 - 109 mEq/L   CO2 28 22 - 29 mEq/L   Glucose 117 70 - 140 mg/dl   BUN 13.5 7.0 - 26.0 mg/dL  Creatinine 0.8 0.6 - 1.1 mg/dL   Total Bilirubin 0.64 0.20 - 1.20 mg/dL   Alkaline Phosphatase 181 (H) 40 - 150 U/L   AST 20 5 - 34 U/L   ALT 29 0 - 55 U/L   Total Protein 8.5 (H) 6.4 - 8.3 g/dL   Albumin 2.9 (L) 3.5 - 5.0 g/dL   Calcium 9.6 8.4 - 10.4 mg/dL   Anion Gap 10 3 - 11 mEq/L   EGFR >90 >90 ml/min/1.73 m2    CBC reviewed with patient at time of appointment, with WBC and ANC up from neulasta.  Iron studies low normal on 08-23-14, still appropriate to take oral iron.  Studies/Results:  No results found.  Medications: I have reviewed the patient's current medications. Instructed oral iron on empty stomach with Vit C or OJ if possible. Miralax and zofran as above. Protonix prescription available.  DISCUSSION Patient previously was concerned about seeing other providers when she needed work in appointments for issues just after care was transferred to this office, now feels that she does not need to see MD for all of upcoming appointments as scheduled. I have asked her to call RN  on 3-17 to let us know how she is, then can cancel MD and possibly lab on 3-18 if she is feeling well, taking po's well, no dark urine, no fever, no other concerns. I will see her with lab at least 3-31, prior to cycle 5 on 09-16-14.  Assessment/Plan: 1.IIIC1 high grade serous endometrial carcinoma: Post optimal debulking by Dr Jacquelyne Balint in [place] 04-06-14 and 3 cycles of adjuvant taxol carboplatin from 05-05-14 thru 07-29-13. Question of retroperitoneal and para-aortic adenopathy by CT 08-15-14. By exam and labs she is stable to continue chemotherapy with cycle 4 on 08-24-14, with neulasta support. RN to speak with her by phone at least 3-17, have left MD appointment with lab on 3-18 to use if needed, and MD will see at least 09-15-14 with lab prior to 09-16-14 cycle 5. 2.PAC in 3.elevated bilirubin and LFTs noted day 15 cycle 3: possibly related to azithromycin. Those chemistries normal with exception of slight elevation AP now. Follow. 4.blood in urine 08-19-14, culture negative, no obvious nephrolithiasis by CT. Refer to urology, no appointment known yet, may need referral from PCP. 5.anemia: macrocytic, B12 not low, may be ETOH.  Iron low normal and will resume iron supplements. Likely multifactorial including blood in urine, chemo etc.  6.esophagitis and otitis resolved 7.long tobacco recently DCd. Stable pulmonary nodules on CT. 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 Restless legs with benadryl. Premed benadryl with chemo 25 mg, po or IV. 13. No acute dental problems, no dentist. 14.baseline low BP per outside medical records. [employer]it constipation since most recent chemo: change zofran to prn when able, add prn miralax   All questions answered. Patient is comfortable with discussion and recommendations/ plans. She knows to  call if concerns prior to next visit. TIme spent 25 min including >50% counseling and coordination of care.   Shalon Councilman P, MD   08/29/2014, 9:31 AM

## 2014-08-29 NOTE — Progress Notes (Signed)
I spoke with Sheena Caldwell at [place] [phone] and they approved unlimited visits- NPI [id number]

## 2014-08-30 ENCOUNTER — Telehealth: Payer: Self-pay | Admitting: *Deleted

## 2014-08-30 NOTE — Telephone Encounter (Signed)
Spoke with Dr. Lesly Rubenstein office - fax#:[phone] and phone#: [phone]. Dr. Mariana Kaufman note from 08/09/14 and Dr. Serita Grit note from 08/05/14 faxed to office (note: other information has been previously faxed to office on 08/25/14).

## 2014-08-30 NOTE — Telephone Encounter (Signed)
Called and spoke to receptionist at Dr. Lesly Rubenstein office inquiring if urology referral has been made yet - she is agreeable to send message to Dr. Lesly Rubenstein nurse. Requested return call to Dr. Mariana Kaufman nurse regarding whether referral has been done or not.

## 2014-08-30 NOTE — Telephone Encounter (Signed)
-----   Message from Gordy Levan, MD sent at 08/30/2014 11:00 AM EDT ----- #2 message on this one PCP Dr Brand Males - I am not sure that the routing information in Epic is correct. There is an old, incorrect address in [employer] for [place] [employer] that is not correct; I believe she is MD now and with [place] at [employer].   Could you please try to call that office, get their correct fax and phone, send MD notes from me back to Feb 23   thanks

## 2014-08-31 ENCOUNTER — Encounter: Payer: Self-pay | Admitting: Specialist

## 2014-08-31 ENCOUNTER — Telehealth: Payer: Self-pay

## 2014-08-31 NOTE — Progress Notes (Signed)
Patient did not come to our scheduled appointment Monday. I called and she told me she was just too tired after her treatment. We have rescheduled for April 1st when she is here for her chemo treatment. The chaplain Lorrin Jackson will meet her in the infusion room.  Epifania Gore, PhD, [place]

## 2014-08-31 NOTE — Telephone Encounter (Signed)
Spoke with Vladimir Faster and she will call [facility] to make referral as requested by patient's insurance.   Requested that [facility] notify patient  and our office of appointment date and time.

## 2014-08-31 NOTE — Telephone Encounter (Signed)
-----   Message from Gordy Levan, MD sent at 08/30/2014 10:54 AM EDT ----- I had requested urology new pt appointment, due to hematuria in early March. Her insurance may need PCP to do referral, Dr Brand Males with [place] in [place]. Could you please try to figure out if PCP referral is needed and try to facilitate. I am routing my notes to PCP, but fine to send whatever else they need.  If PCP prefers urology group other than [facility], also fine with me, I just need to share my records and receive their information. If [facility], I am fine with any MD.  This is not urgent, but doesn't seem to be happening (?)  Thanks

## 2014-09-01 ENCOUNTER — Telehealth: Payer: Self-pay | Admitting: Oncology

## 2014-09-01 ENCOUNTER — Telehealth: Payer: Self-pay | Admitting: *Deleted

## 2014-09-01 NOTE — Telephone Encounter (Signed)
[name] called to let Dr. Marko Plume know that Sheena Caldwell is set up to see Dr. Diona Fanti with [facility] on April 13th at 2 pm.   Vladimir Faster will fax office notes and call patient with the appointmnet.

## 2014-09-01 NOTE — Telephone Encounter (Signed)
Patient called reporting she feels well enough that she does not need to come in tomorrow.  P.O.F. Generated

## 2014-09-01 NOTE — Telephone Encounter (Signed)
per pof to CX pt appt-pt seen on 3/14-pt aware

## 2014-09-02 ENCOUNTER — Other Ambulatory Visit: Payer: Medicaid Other

## 2014-09-02 ENCOUNTER — Ambulatory Visit: Payer: Medicaid Other | Admitting: Oncology

## 2014-09-08 ENCOUNTER — Ambulatory Visit: Payer: Medicaid Other | Admitting: Oncology

## 2014-09-08 ENCOUNTER — Other Ambulatory Visit: Payer: Medicaid Other

## 2014-09-09 ENCOUNTER — Ambulatory Visit: Payer: Medicaid Other

## 2014-09-14 ENCOUNTER — Other Ambulatory Visit: Payer: Self-pay | Admitting: Oncology

## 2014-09-15 ENCOUNTER — Ambulatory Visit (HOSPITAL_BASED_OUTPATIENT_CLINIC_OR_DEPARTMENT_OTHER): Payer: Medicaid Other | Admitting: Oncology

## 2014-09-15 ENCOUNTER — Other Ambulatory Visit (HOSPITAL_BASED_OUTPATIENT_CLINIC_OR_DEPARTMENT_OTHER): Payer: Medicaid Other

## 2014-09-15 ENCOUNTER — Telehealth: Payer: Self-pay | Admitting: Oncology

## 2014-09-15 VITALS — BP 105/71 | HR 64 | Temp 97.4°F | Resp 17 | Ht 66.0 in | Wt 140.2 lb

## 2014-09-15 DIAGNOSIS — D701 Agranulocytosis secondary to cancer chemotherapy: Secondary | ICD-10-CM

## 2014-09-15 DIAGNOSIS — R319 Hematuria, unspecified: Secondary | ICD-10-CM | POA: Diagnosis not present

## 2014-09-15 DIAGNOSIS — C541 Malignant neoplasm of endometrium: Secondary | ICD-10-CM

## 2014-09-15 DIAGNOSIS — R112 Nausea with vomiting, unspecified: Secondary | ICD-10-CM

## 2014-09-15 DIAGNOSIS — G622 Polyneuropathy due to other toxic agents: Secondary | ICD-10-CM

## 2014-09-15 DIAGNOSIS — G620 Drug-induced polyneuropathy: Secondary | ICD-10-CM

## 2014-09-15 DIAGNOSIS — Z95828 Presence of other vascular implants and grafts: Secondary | ICD-10-CM

## 2014-09-15 DIAGNOSIS — T451X5A Adverse effect of antineoplastic and immunosuppressive drugs, initial encounter: Secondary | ICD-10-CM

## 2014-09-15 LAB — CBC WITH DIFFERENTIAL/PLATELET
BASO%: 1.1 % (ref 0.0–2.0)
Basophils Absolute: 0 10*3/uL (ref 0.0–0.1)
EOS%: 1.4 % (ref 0.0–7.0)
Eosinophils Absolute: 0.1 10*3/uL (ref 0.0–0.5)
HCT: 32.1 % — ABNORMAL LOW (ref 34.8–46.6)
HEMOGLOBIN: 10.6 g/dL — AB (ref 11.6–15.9)
LYMPH#: 1.4 10*3/uL (ref 0.9–3.3)
LYMPH%: 38.2 % (ref 14.0–49.7)
MCH: 36.2 pg — ABNORMAL HIGH (ref 25.1–34.0)
MCHC: 33 g/dL (ref 31.5–36.0)
MCV: 109.6 fL — ABNORMAL HIGH (ref 79.5–101.0)
MONO#: 0.4 10*3/uL (ref 0.1–0.9)
MONO%: 10.5 % (ref 0.0–14.0)
NEUT#: 1.7 10*3/uL (ref 1.5–6.5)
NEUT%: 48.8 % (ref 38.4–76.8)
Platelets: 178 10*3/uL (ref 145–400)
RBC: 2.93 10*6/uL — ABNORMAL LOW (ref 3.70–5.45)
RDW: 16.1 % — AB (ref 11.2–14.5)
WBC: 3.5 10*3/uL — ABNORMAL LOW (ref 3.9–10.3)

## 2014-09-15 LAB — URINALYSIS, MICROSCOPIC - CHCC
Bilirubin (Urine): NEGATIVE
Glucose: NEGATIVE mg/dL
Ketones: NEGATIVE mg/dL
Leukocyte Esterase: NEGATIVE
NITRITE: NEGATIVE
Protein: NEGATIVE mg/dL
Specific Gravity, Urine: 1.01 (ref 1.003–1.035)
UROBILINOGEN UR: 0.2 mg/dL (ref 0.2–1)
pH: 5 (ref 4.6–8.0)

## 2014-09-15 LAB — COMPREHENSIVE METABOLIC PANEL (CC13)
ALK PHOS: 149 U/L (ref 40–150)
ALT: 18 U/L (ref 0–55)
AST: 15 U/L (ref 5–34)
Albumin: 3.2 g/dL — ABNORMAL LOW (ref 3.5–5.0)
Anion Gap: 9 mEq/L (ref 3–11)
BUN: 9.6 mg/dL (ref 7.0–26.0)
CALCIUM: 9.5 mg/dL (ref 8.4–10.4)
CO2: 29 mEq/L (ref 22–29)
CREATININE: 0.8 mg/dL (ref 0.6–1.1)
Chloride: 105 mEq/L (ref 98–109)
GLUCOSE: 83 mg/dL (ref 70–140)
Potassium: 4.3 mEq/L (ref 3.5–5.1)
Sodium: 142 mEq/L (ref 136–145)
Total Bilirubin: 0.49 mg/dL (ref 0.20–1.20)
Total Protein: 8.1 g/dL (ref 6.4–8.3)

## 2014-09-15 NOTE — Telephone Encounter (Signed)
Patient stopped by for a print out/avs prior to dr Marko Plume sending a pof,appointments have been made and patient will get a new schedule at 4/1 appointment

## 2014-09-16 ENCOUNTER — Other Ambulatory Visit: Payer: Medicaid Other

## 2014-09-16 ENCOUNTER — Ambulatory Visit (HOSPITAL_BASED_OUTPATIENT_CLINIC_OR_DEPARTMENT_OTHER): Payer: Medicaid Other

## 2014-09-16 ENCOUNTER — Encounter: Payer: Self-pay | Admitting: Oncology

## 2014-09-16 ENCOUNTER — Other Ambulatory Visit: Payer: Self-pay | Admitting: Oncology

## 2014-09-16 DIAGNOSIS — C541 Malignant neoplasm of endometrium: Secondary | ICD-10-CM

## 2014-09-16 DIAGNOSIS — Z5111 Encounter for antineoplastic chemotherapy: Secondary | ICD-10-CM

## 2014-09-16 DIAGNOSIS — R319 Hematuria, unspecified: Secondary | ICD-10-CM | POA: Insufficient documentation

## 2014-09-16 MED ORDER — SODIUM CHLORIDE 0.9 % IJ SOLN
10.0000 mL | INTRAMUSCULAR | Status: DC | PRN
  Administered 2014-09-16: 10 mL
  Filled 2014-09-16: qty 10

## 2014-09-16 MED ORDER — HEPARIN SOD (PORK) LOCK FLUSH 100 UNIT/ML IV SOLN
500.0000 [IU] | Freq: Once | INTRAVENOUS | Status: AC | PRN
  Administered 2014-09-16: 500 [IU]
  Filled 2014-09-16: qty 5

## 2014-09-16 MED ORDER — DIPHENHYDRAMINE HCL 25 MG PO CAPS
ORAL_CAPSULE | ORAL | Status: AC
  Filled 2014-09-16: qty 1

## 2014-09-16 MED ORDER — SODIUM CHLORIDE 0.9 % IV SOLN
600.0000 mg | Freq: Once | INTRAVENOUS | Status: AC
  Administered 2014-09-16: 600 mg via INTRAVENOUS
  Filled 2014-09-16: qty 60

## 2014-09-16 MED ORDER — FAMOTIDINE IN NACL 20-0.9 MG/50ML-% IV SOLN
20.0000 mg | Freq: Once | INTRAVENOUS | Status: AC
  Administered 2014-09-16: 20 mg via INTRAVENOUS

## 2014-09-16 MED ORDER — DEXTROSE 5 % IV SOLN
175.0000 mg/m2 | Freq: Once | INTRAVENOUS | Status: AC
  Administered 2014-09-16: 300 mg via INTRAVENOUS
  Filled 2014-09-16: qty 50

## 2014-09-16 MED ORDER — SODIUM CHLORIDE 0.9 % IV SOLN
491.5000 mg | Freq: Once | INTRAVENOUS | Status: DC
  Filled 2014-09-16: qty 49

## 2014-09-16 MED ORDER — DIPHENHYDRAMINE HCL 50 MG/ML IJ SOLN: 25.0000 mg | Freq: Once | INTRAMUSCULAR | Status: DC

## 2014-09-16 MED ORDER — DIPHENHYDRAMINE HCL 25 MG PO CAPS
25.0000 mg | ORAL_CAPSULE | Freq: Once | ORAL | Status: AC
  Administered 2014-09-16: 25 mg via ORAL

## 2014-09-16 MED ORDER — FAMOTIDINE IN NACL 20-0.9 MG/50ML-% IV SOLN
INTRAVENOUS | Status: AC
  Filled 2014-09-16: qty 50

## 2014-09-16 MED ORDER — SODIUM CHLORIDE 0.9 % IV SOLN
Freq: Once | INTRAVENOUS | Status: AC
  Administered 2014-09-16: 12:00:00 via INTRAVENOUS

## 2014-09-16 MED ORDER — SODIUM CHLORIDE 0.9 % IV SOLN
Freq: Once | INTRAVENOUS | Status: AC
  Administered 2014-09-16: 12:00:00 via INTRAVENOUS
  Filled 2014-09-16: qty 8

## 2014-09-16 NOTE — Progress Notes (Signed)
OFFICE PROGRESS NOTE   September 15, 2014  [place] Carver Fila Hedgecock(PCP, [facility] [employer]), _ [name] [facility]). New patient appointment scheduled to Dr Alan Ripper 09-28-14  INTERVAL HISTORY:  Patient is seen, alone for visit, in continuing attention to adjuvant carboplatin taxol which is being used for IIIC high grade serous endometrial carcinoma, due cycle 5 on 09-16-14. She will have neulasta on 09-19-14.  Patient has felt well during past week, with no nausea now, excellent appetite and energy generally good.  She has had some vaginal itching, as she has had intermittently since beginning chemotherapy with Dr Sabra Heck, using both monistat and hydrocortisone cream for this; I have told her to use monistat only. She has minimal tingling in feet, better when she wears socks and not interfering with activity, none in hands. Bowels are moving well. She denies hematuria or bladder symptoms. Suture line above PAC is a little red, as it has also been intermittently since placement per patient, not tender.   PAC in Flu vaccine done  ONCOLOGIC HISTORY Patient had been menopausal since age 65, then had one episode of vaginal bleeding early 2015. Later in 2015 she had persistent vaginal discharge, for which she was seen by PCP Brand Males (APP with [place] at [place] at [facility]) in ~ 02-2014, had CTs in [facility] (reports to be requested) and was referred to Dr Adella Nissen, gyn in [facility]. Endometrial biopsy had concern (that path not included in information available now) such that she was referred to Dr Jacquelyne Balint. Surgery by Dr Sabra Heck at [facility] in [place] on 04-06-14 was TAH, BSO, omentectomy, pelvic and right common iliac node evaluation; patient was hospitalized x 3 days and tells me that she recovered well from the surgery. Pathology [phone]) from 04-06-14 found high grade serous carcinoma  involving entire thickness of myometrium (depth of invasion 1.1 cm out of 1.1 cm), with invasion of cervical stroma, no involvement of uterine serosa/bilateral tubes and ovaries/omentum, positive LVSI, 2/5 right pelvic nodes, 2 of 4 left pelvic nodes and 0/1 right common iliac node. Bulk of the carcinoma was in lower uterine segment where it was within 0.1 cm of serosal surface. Cytology positive for adenocarcinoma on washings ([id number]). Surgical findings were significant for no paraaortic adenopathy apparent. Patient has received 3 cycles of adjuvant chemotherapy in [facility] by Dr Sabra Heck, on 05-05-14, 05-26-14 and 07-29-14. Per records and patient, delay of cycle 3 was related to low counts and insurance changes. She received neulasta after chemotherapy on 07-29-14. Taxol was given at 175 mg/m2 for total dose was 280 mg cycle 1 and 291 mg for cycles 2 and 3; carboplatin was AUC = 6 with total dose 610 mg cycle 1, 620 mg cycle 2 and 630 mg cycle 3.. She does not recall using oral decadron premed for taxol, with premeds listed on chemo flowsheets standard decadron 20 mg, zofran 16 mg, benadryl 50 mg (which caused severe restless legs) and pepcid 20 mg. Outside information does not include serial blood counts. Last note from Dr Sabra Heck dated 07-29-14 describes unremarkable exam, "NED during chemotherapy and adequate PS". Patient was see by Dr Denman George 08-09-14, who recommends restaging scans after 6 cycles of chemotherapy and consideration of vaginal brachytherapy; she has not been seen by radiation oncology in [place]. CT AP done in [facility] 08-15-14 (due to elevated LFTs and blood in urine just after transfer of care) with soft tissue fullness at superior  abdominal aorta and question of retroperitoneal necrotic adenopathy; plan repeat scans after complete present chemotherapy. Cycle 4 carbo taxol given 08-24-14 at [facility], with neulasta.    Review of systems as above, also: No fever. No orthostatic symptoms.  Tolerating ferrous sulfate without difficulty Remainder of 10 point Review of Systems negative.  Objective:  Vital signs in last 24 hours:  BP 105/71 mmHg  Pulse 64  Temp(Src) 97.4 F (36.3 C) (Oral)  Resp 17  Ht 5' 6"  (1.676 m)  Wt 140 lb 3 oz (63.589 kg)  BMI 22.64 kg/m2 Weight is up 8 lbs. Alert, oriented and appropriate. Ambulatory without difficulty.  Alopecia  HEENT:PERRL, sclerae not icteric. Oral mucosa moist without lesions, posterior pharynx clear.  Neck supple. No JVD.  Lymphatics:no cervical,supraclavicular, axillary or inguinal adenopathy Resp: clear to auscultation bilaterally and normal percussion bilaterally Cardio: regular rate and rhythm. No gallop. GI: soft, nontender, not distended, no mass or organomegaly. Normally active bowel sounds. Surgical incision not remarkable. Musculoskeletal/ Extremities: without pitting edema, cords, tenderness Neuro: no significant peripheral neuropathy. Otherwise nonfocal. PSYCH appropriate mood and affect Skin without rash, ecchymosis, petechiae Portacath-suture line above PAC with minimal erythema, no tenderness or heat. Access site is without erythema or tenderness.  Lab Results:  Results for orders placed or performed in visit on 09/15/14  CBC with Differential  Result Value Ref Range   WBC 3.5 (L) 3.9 - 10.3 10e3/uL   NEUT# 1.7 1.5 - 6.5 10e3/uL   HGB 10.6 (L) 11.6 - 15.9 g/dL   HCT 32.1 (L) 34.8 - 46.6 %   Platelets 178 145 - 400 10e3/uL   MCV 109.6 (H) 79.5 - 101.0 fL   MCH 36.2 (H) 25.1 - 34.0 pg   MCHC 33.0 31.5 - 36.0 g/dL   RBC 2.93 (L) 3.70 - 5.45 10e6/uL   RDW 16.1 (H) 11.2 - 14.5 %   lymph# 1.4 0.9 - 3.3 10e3/uL   MONO# 0.4 0.1 - 0.9 10e3/uL   Eosinophils Absolute 0.1 0.0 - 0.5 10e3/uL   Basophils Absolute 0.0 0.0 - 0.1 10e3/uL   NEUT% 48.8 38.4 - 76.8 %   LYMPH% 38.2 14.0 - 49.7 %   MONO% 10.5 0.0 - 14.0 %   EOS% 1.4 0.0 - 7.0 %   BASO% 1.1 0.0 - 2.0 %  Comprehensive metabolic panel (Cmet) - [facility]   Result Value Ref Range   Sodium 142 136 - 145 mEq/L   Potassium 4.3 3.5 - 5.1 mEq/L   Chloride 105 98 - 109 mEq/L   CO2 29 22 - 29 mEq/L   Glucose 83 70 - 140 mg/dl   BUN 9.6 7.0 - 26.0 mg/dL   Creatinine 0.8 0.6 - 1.1 mg/dL   Total Bilirubin 0.49 0.20 - 1.20 mg/dL   Alkaline Phosphatase 149 40 - 150 U/L   AST 15 5 - 34 U/L   ALT 18 0 - 55 U/L   Total Protein 8.1 6.4 - 8.3 g/dL   Albumin 3.2 (L) 3.5 - 5.0 g/dL   Calcium 9.5 8.4 - 10.4 mg/dL   Anion Gap 9 3 - 11 mEq/L   EGFR >90 >90 ml/min/1.73 m2  Urinalysis with microscopic - [facility]  Result Value Ref Range   Glucose Negative Negative mg/dL   Bilirubin (Urine) Negative Negative   Ketones Negative Negative mg/dL   Specific Gravity, Urine 1.010 1.003 - 1.035   Blood Moderate Negative   pH 5.0 4.6 - 8.0   Protein Negative Negative- <30 mg/dL   Urobilinogen,  UR 0.2 0.2 - 1 mg/dL   Nitrite Negative Negative   Leukocyte Esterase Negative Negative   RBC / HPF 3-6 0 - 2   WBC, UA 0-2 0 - 2   Bacteria, UA Few Negative- Trace   Epithelial Cells Occasional Negative- Few     Studies/Results:  No results found.  Medications: I have reviewed the patient's current medications. She does not use oral decadron premedication for taxol.  DISCUSSION: Patient is in agreement with cycle 5 chemo on 4-1 as planned. Discussed need to give neulasta on 4-4 (has to be given >24 hrs after chemo and office not open on Sun 4-3). She will take antiemetics regularly first several days after treatment, as has controlled nausea well with previous treatments. She understands that peripheral neuropathy is related to the taxol and we will follow closely, but potential benefit to the drug outweighs risk at this point.  Assessment/Plan:  1.IIIC1 high grade serous endometrial carcinoma: Post optimal debulking by Dr Jacquelyne Balint in [place] 04-06-14 and 3 cycles of adjuvant taxol carboplatin from 05-05-14 thru 07-29-13. Question of retroperitoneal and  para-aortic adenopathy by CT 08-15-14. By exam and labs she is stable to continue chemotherapy with cycle 5 on 09-16-14, with neulasta. I will see her with labs 4-11 and 4-21, prior to cycle 6 due 4-22.  2.PAC in: likely slight suture reaction, follow 3.elevated bilirubin and LFTs noted day 15 cycle 3: possibly related to azithromycin.  4.blood in urine 08-19-14, culture negative, no obvious nephrolithiasis by CT. New patient consultation with Dr Diona Fanti at [facility] 09-28-14. No hematuria on UA today. 5.anemia: macrocytic, B12 not low, may be ETOH. Iron low normal, resumed iron supplements. Likely multifactorial including chemo  6.esophagitis and otitis resolved 7.long tobacco recently DCd. Stable pulmonary nodules on CT. 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 Follow, lots of exercise. 11.overdue mammograms, which we will address when possible 12. Restless legs with benadryl, dose decreased. 13. No acute dental problems, no dentist. 14.baseline low BP per outside medical records.Patient is aware that she needs to push po fluids after chemo 15.vaginal itching: try monistat without hydrocortisone now.  All questions answered and patient is in agreement with plans as above. She knows that she can call at any time prior to next scheduled appointment if needed. Chemo and neulasta orders confirmed. Time spent 25 min including >50% counseling and coordination of care.     Gordy Levan, MD

## 2014-09-16 NOTE — Patient Instructions (Signed)
[place] Discharge Instructions for Patients Receiving Chemotherapy  Today you received the following chemotherapy agents Taxol and Carboplatin  To help prevent nausea and vomiting after your treatment, we encourage you to take your nausea medication Phenergan 12.5 mg every 6 hours as needed.   If you develop nausea and vomiting that is not controlled by your nausea medication, call the clinic.   BELOW ARE SYMPTOMS THAT SHOULD BE REPORTED IMMEDIATELY:  *FEVER GREATER THAN 100.5 F  *CHILLS WITH OR WITHOUT FEVER  NAUSEA AND VOMITING THAT IS NOT CONTROLLED WITH YOUR NAUSEA MEDICATION  *UNUSUAL SHORTNESS OF BREATH  *UNUSUAL BRUISING OR BLEEDING  TENDERNESS IN MOUTH AND THROAT WITH OR WITHOUT PRESENCE OF ULCERS  *URINARY PROBLEMS  *BOWEL PROBLEMS  UNUSUAL RASH Items with * indicate a potential emergency and should be followed up as soon as possible.  Feel free to call the clinic you have any questions or concerns. The clinic phone number is [phone].  Please show the [place] at check-in to the Emergency Department and triage nurse.

## 2014-09-19 ENCOUNTER — Ambulatory Visit (HOSPITAL_BASED_OUTPATIENT_CLINIC_OR_DEPARTMENT_OTHER): Payer: Medicaid Other

## 2014-09-19 DIAGNOSIS — C541 Malignant neoplasm of endometrium: Secondary | ICD-10-CM

## 2014-09-19 DIAGNOSIS — Z5189 Encounter for other specified aftercare: Secondary | ICD-10-CM

## 2014-09-19 MED ORDER — PEGFILGRASTIM INJECTION 6 MG/0.6ML ~~LOC~~
6.0000 mg | PREFILLED_SYRINGE | Freq: Once | SUBCUTANEOUS | Status: AC
  Administered 2014-09-19: 6 mg via SUBCUTANEOUS
  Filled 2014-09-19: qty 0.6

## 2014-09-21 ENCOUNTER — Other Ambulatory Visit: Payer: Self-pay | Admitting: Oncology

## 2014-09-21 ENCOUNTER — Encounter: Payer: Self-pay | Admitting: Skilled Nursing Facility1

## 2014-09-21 NOTE — Progress Notes (Signed)
Subjective:     Patient ID: Sheena Caldwell, female   DOB: June 22, 1950, 64 y.o.   MRN: [id number]  HPI   Review of Systems     Objective:   Physical Exam To help the pt identify some dietary strategies to gain weight from losing weight.    Assessment:     Pt identified as being malnourished. Dietitian contacted pt via telephone [phone]). Pt states she did lose a lot of weight but has recently gained that weight back. Pt states her appetite is fine now and appreciates the call.     Plan:     No plan at this time.

## 2014-09-22 ENCOUNTER — Telehealth: Payer: Self-pay | Admitting: *Deleted

## 2014-09-22 ENCOUNTER — Other Ambulatory Visit: Payer: Self-pay | Admitting: Oncology

## 2014-09-22 NOTE — Telephone Encounter (Signed)
Called patient and let her know it is fine to cancel lab and MD appt on 09-26-14 if she is not having any problems. Patient states she is not having any problems. Agreeable to keep appts on 4-21 and 4-22.

## 2014-09-22 NOTE — Telephone Encounter (Signed)
Ok to cancel LL + lab 4-11 if no problems.  I still need to see her 4-21 with labs prior to Rx 4-22. Thank you

## 2014-09-22 NOTE — Telephone Encounter (Signed)
PT. STATES EVERYTHING IS FINE. SHE SAID DR.LIVESAY HAS CANCELLED APPOINTMENTS IN THE PAST WHEN EVERYTHING WAS FINE. THIS NOTE ROUTED TO [place].

## 2014-09-26 ENCOUNTER — Ambulatory Visit: Payer: Medicaid Other | Admitting: Oncology

## 2014-09-26 ENCOUNTER — Other Ambulatory Visit: Payer: Medicaid Other

## 2014-09-27 ENCOUNTER — Emergency Department (HOSPITAL_COMMUNITY)
Admission: EM | Admit: 2014-09-27 | Discharge: 2014-09-27 | Disposition: A | Discharge status: Home / Self Care | Payer: Medicaid Other | Attending: Emergency Medicine | Admitting: Emergency Medicine

## 2014-09-27 ENCOUNTER — Telehealth: Payer: Self-pay | Admitting: *Deleted

## 2014-09-27 ENCOUNTER — Encounter (HOSPITAL_COMMUNITY): Payer: Self-pay | Admitting: Emergency Medicine

## 2014-09-27 ENCOUNTER — Emergency Department (HOSPITAL_COMMUNITY): Payer: Medicaid Other

## 2014-09-27 DIAGNOSIS — R509 Fever, unspecified: Secondary | ICD-10-CM | POA: Insufficient documentation

## 2014-09-27 DIAGNOSIS — Z8542 Personal history of malignant neoplasm of other parts of uterus: Secondary | ICD-10-CM | POA: Insufficient documentation

## 2014-09-27 DIAGNOSIS — Z79899 Other long term (current) drug therapy: Secondary | ICD-10-CM | POA: Diagnosis not present

## 2014-09-27 DIAGNOSIS — I959 Hypotension, unspecified: Secondary | ICD-10-CM | POA: Diagnosis not present

## 2014-09-27 DIAGNOSIS — R5383 Other fatigue: Secondary | ICD-10-CM | POA: Diagnosis not present

## 2014-09-27 DIAGNOSIS — Z87891 Personal history of nicotine dependence: Secondary | ICD-10-CM | POA: Insufficient documentation

## 2014-09-27 DIAGNOSIS — R0789 Other chest pain: Secondary | ICD-10-CM | POA: Diagnosis not present

## 2014-09-27 DIAGNOSIS — Z7952 Long term (current) use of systemic steroids: Secondary | ICD-10-CM | POA: Diagnosis not present

## 2014-09-27 LAB — URINALYSIS, ROUTINE W REFLEX MICROSCOPIC
Bilirubin Urine: NEGATIVE
Glucose, UA: NEGATIVE mg/dL
Ketones, ur: NEGATIVE mg/dL
Leukocytes, UA: NEGATIVE
NITRITE: NEGATIVE
Protein, ur: NEGATIVE mg/dL
SPECIFIC GRAVITY, URINE: 1.002 — AB (ref 1.005–1.030)
UROBILINOGEN UA: 0.2 mg/dL (ref 0.0–1.0)
pH: 6.5 (ref 5.0–8.0)

## 2014-09-27 LAB — I-STAT CG4 LACTIC ACID, ED
Lactic Acid, Venous: 0.77 mmol/L (ref 0.5–2.0)
Lactic Acid, Venous: 1.08 mmol/L (ref 0.5–2.0)

## 2014-09-27 LAB — CBC WITH DIFFERENTIAL/PLATELET
BASOS PCT: 0 % (ref 0–1)
Basophils Absolute: 0 10*3/uL (ref 0.0–0.1)
EOS ABS: 0 10*3/uL (ref 0.0–0.7)
EOS PCT: 0 % (ref 0–5)
HCT: 37.1 % (ref 36.0–46.0)
HEMOGLOBIN: 12.4 g/dL (ref 12.0–15.0)
LYMPHS PCT: 11 % — AB (ref 12–46)
Lymphs Abs: 1.1 10*3/uL (ref 0.7–4.0)
MCH: 35.8 pg — ABNORMAL HIGH (ref 26.0–34.0)
MCHC: 33.4 g/dL (ref 30.0–36.0)
MCV: 107.2 fL — ABNORMAL HIGH (ref 78.0–100.0)
MONOS PCT: 9 % (ref 3–12)
Monocytes Absolute: 0.9 10*3/uL (ref 0.1–1.0)
Neutro Abs: 8 10*3/uL — ABNORMAL HIGH (ref 1.7–7.7)
Neutrophils Relative %: 80 % — ABNORMAL HIGH (ref 43–77)
Platelets: 99 10*3/uL — ABNORMAL LOW (ref 150–400)
RBC: 3.46 MIL/uL — AB (ref 3.87–5.11)
RDW: 15.8 % — ABNORMAL HIGH (ref 11.5–15.5)
WBC: 10 10*3/uL (ref 4.0–10.5)

## 2014-09-27 LAB — I-STAT CHEM 8, ED
BUN: 6 mg/dL (ref 6–23)
Calcium, Ion: 1.12 mmol/L — ABNORMAL LOW (ref 1.13–1.30)
Chloride: 97 mmol/L (ref 96–112)
Creatinine, Ser: 0.6 mg/dL (ref 0.50–1.10)
GLUCOSE: 106 mg/dL — AB (ref 70–99)
HEMATOCRIT: 31 % — AB (ref 36.0–46.0)
HEMOGLOBIN: 10.5 g/dL — AB (ref 12.0–15.0)
POTASSIUM: 3.7 mmol/L (ref 3.5–5.1)
SODIUM: 134 mmol/L — AB (ref 135–145)
TCO2: 22 mmol/L (ref 0–100)

## 2014-09-27 LAB — COMPREHENSIVE METABOLIC PANEL
ALBUMIN: 3.5 g/dL (ref 3.5–5.2)
ALK PHOS: 140 U/L — AB (ref 39–117)
ALT: 28 U/L (ref 0–35)
AST: 24 U/L (ref 0–37)
Anion gap: 7 (ref 5–15)
BILIRUBIN TOTAL: 0.6 mg/dL (ref 0.3–1.2)
BUN: 8 mg/dL (ref 6–23)
CO2: 25 mmol/L (ref 19–32)
Calcium: 8.7 mg/dL (ref 8.4–10.5)
Chloride: 100 mmol/L (ref 96–112)
Creatinine, Ser: 0.63 mg/dL (ref 0.50–1.10)
GFR calc Af Amer: >90 mL/min (ref >90)
GFR calc non Af Amer: >90 mL/min (ref >90)
Glucose, Bld: 104 mg/dL — ABNORMAL HIGH (ref 70–99)
POTASSIUM: 3.7 mmol/L (ref 3.5–5.1)
Sodium: 132 mmol/L — ABNORMAL LOW (ref 135–145)
TOTAL PROTEIN: 8 g/dL (ref 6.0–8.3)

## 2014-09-27 LAB — URINE MICROSCOPIC-ADD ON

## 2014-09-27 MED ORDER — VANCOMYCIN HCL IN DEXTROSE 1-5 GM/200ML-% IV SOLN
1000.0000 mg | Freq: Once | INTRAVENOUS | Status: AC
  Administered 2014-09-27: 1000 mg via INTRAVENOUS
  Filled 2014-09-27: qty 200

## 2014-09-27 MED ORDER — PIPERACILLIN-TAZOBACTAM 3.375 G IVPB 30 MIN
3.3750 g | Freq: Once | INTRAVENOUS | Status: AC
  Administered 2014-09-27: 3.375 g via INTRAVENOUS
  Filled 2014-09-27: qty 50

## 2014-09-27 MED ORDER — SODIUM CHLORIDE 0.9 % IV BOLUS (SEPSIS)
1000.0000 mL | Freq: Once | INTRAVENOUS | Status: AC
  Administered 2014-09-27: 1000 mL via INTRAVENOUS

## 2014-09-27 NOTE — ED Notes (Signed)
Pt states she started feeling fatigued Saturday. Denies nausea, vomiting, diarrhea, sore throat, pain with urination. States her fever was 102 this morning and took some Ibuprofen. Said last night she felt some indigestion after eating what she thought was bad shrimp. Her PCP recommended she come here today.

## 2014-09-27 NOTE — ED Provider Notes (Signed)
CSN: [id number]     Arrival date & time 09/27/14  1554 History   First MD Initiated Contact with Patient 09/27/14 1606     Chief Complaint  Patient presents with  . Hypotension  . Fatigue  . Fever     (Consider location/radiation/quality/duration/timing/severity/associated sxs/prior Treatment) Patient is a 64 y.o. female presenting with fever. The history is provided by the patient.  Fever Associated symptoms: chest pain   Associated symptoms: no diarrhea, no headaches, no nausea, no rash and no vomiting    patient is currently on chemotherapy for uterine cancer. Last had treatment 11 days ago with Neulasta 8 days ago. Developed fever 102 today. No cough. States she did have some tightness in her chest. States she attributed to some shrimp that she ate that she thinks may have been bad. No nausea or vomiting. No cough. No diarrhea. No dysuria. No headache. No confusion. States she does feel little weak. No rashes. States she has not had problems neutropenia in the past.  Past Medical History  Diagnosis Date  . Uterine cancer    Past Surgical History  Procedure Laterality Date  . Cesarean section    . Incise and drain abcess  1970    scalp  . Portacath placement  04/26/2014    rt. power port with tip in SVC   Family History  Problem Relation Age of Onset  . Hypertension Mother   . Cancer Father    History  Substance Use Topics  . Smoking status: Former Smoker -- 1.00 packs/day for 30 years    Types: Cigarettes    Quit date: 07/16/2013  . Smokeless tobacco: Never Used  . Alcohol Use: 1.2 - 1.8 oz/week    2-3 Glasses of wine per week     Comment: occasional wine   OB History    Gravida Para Term Preterm AB TAB SAB Ectopic Multiple Living   3 2 2  0 1 0 0 0 0 2     Review of Systems  Constitutional: Positive for fever and fatigue. Negative for activity change and appetite change.  Eyes: Negative for pain.  Respiratory: Negative for chest tightness and shortness of  breath.   Cardiovascular: Positive for chest pain. Negative for leg swelling.  Gastrointestinal: Negative for nausea, vomiting, abdominal pain and diarrhea.  Genitourinary: Negative for flank pain.  Musculoskeletal: Negative for back pain and neck stiffness.  Skin: Negative for rash.  Neurological: Negative for weakness, numbness and headaches.  Psychiatric/Behavioral: Negative for behavioral problems.      Allergies  Levaquin  Home Medications   Prior to Admission medications   Medication Sig Start Date End Date Taking? Authorizing Provider  clonazePAM (KLONOPIN) 1 MG tablet Take 1 mg by mouth 2 (two) times daily as needed for anxiety (for restless legs).   Yes Historical Provider, MD  docusate sodium (COLACE) 100 MG capsule Take 100-200 mg by mouth daily as needed for mild constipation or moderate constipation.    Yes Historical Provider, MD  ferrous sulfate 325 (65 FE) MG tablet Take 325 mg by mouth daily.   Yes Historical Provider, MD  HYDROcodone-acetaminophen (NORCO/VICODIN) 5-325 MG per tablet Take 1 tablet by mouth every 4 (four) hours as needed for moderate pain.   Yes Historical Provider, MD  hydrocortisone cream 1 % Apply 1 application topically 2 (two) times daily as needed for itching.   Yes Historical Provider, MD  loratadine (CLARITIN) 10 MG tablet Take 10 mg by mouth daily as needed for allergies.  Yes Historical Provider, MD  miconazole (MONISTAT 1 COMBINATION PACK) kit Place 1 each vaginally 2 (two) times daily as needed (Pt. applies to external vaginal area).   Yes Historical Provider, MD  Multiple Vitamin (MULTI-VITAMINS) TABS Take 1 tablet by mouth daily.   Yes Historical Provider, MD  naproxen sodium (ANAPROX) 220 MG tablet Take 220 mg by mouth 2 (two) times daily with a meal. Pt takes med as needed   Yes Historical Provider, MD  ondansetron (ZOFRAN) 8 MG tablet Take 8 mg by mouth every 8 (eight) hours as needed for nausea or vomiting.   Yes Historical Provider, MD   polyethylene glycol (MIRALAX / GLYCOLAX) packet Take 17 g by mouth daily as needed.   Yes Historical Provider, MD  Mentone   Yes Historical Provider, MD  promethazine (PHENERGAN) 12.5 MG tablet Take 12.5 mg by mouth every 6 (six) hours as needed for nausea or vomiting.   Yes Historical Provider, MD   BP 97/65 mmHg  Pulse 83  Temp(Src) 99.6 F (37.6 C) (Oral)  Resp 22  Ht 5' 6.5" (1.689 m)  Wt 137 lb (62.143 kg)  BMI 21.78 kg/m2  SpO2 98% Physical Exam  Constitutional: She is oriented to person, place, and time. She appears well-developed and well-nourished.  HENT:  Head: Normocephalic and atraumatic.  Eyes: EOM are normal. Pupils are equal, round, and reactive to light.  Neck: Normal range of motion. Neck supple.  Cardiovascular: Normal rate, regular rhythm and normal heart sounds.   No murmur heard. Pulmonary/Chest: Effort normal and breath sounds normal. No respiratory distress. She has no wheezes. She has no rales.  Port-A-Cath to right chest wall.  Abdominal: Soft. Bowel sounds are normal. She exhibits no distension. There is no tenderness. There is no rebound and no guarding.  Musculoskeletal: Normal range of motion.  Neurological: She is alert and oriented to person, place, and time. No cranial nerve deficit.  Skin: Skin is warm and dry.  Psychiatric: She has a normal mood and affect. Her speech is normal.  Nursing note and vitals reviewed.   ED Course  Procedures (including critical care time) Labs Review Labs Reviewed  CBC WITH DIFFERENTIAL/PLATELET - Abnormal; Notable for the following:    RBC 3.46 (*)    MCV 107.2 (*)    MCH 35.8 (*)    RDW 15.8 (*)    Platelets 99 (*)    Neutrophils Relative % 80 (*)    Lymphocytes Relative 11 (*)    Neutro Abs 8.0 (*)    All other components within normal limits  COMPREHENSIVE METABOLIC PANEL - Abnormal; Notable for the following:    Sodium 132 (*)    Glucose, Bld 104 (*)    Alkaline Phosphatase  140 (*)    All other components within normal limits  URINALYSIS, ROUTINE W REFLEX MICROSCOPIC - Abnormal; Notable for the following:    Specific Gravity, Urine 1.002 (*)    Hgb urine dipstick MODERATE (*)    All other components within normal limits  I-STAT CHEM 8, ED - Abnormal; Notable for the following:    Sodium 134 (*)    Glucose, Bld 106 (*)    Calcium, Ion 1.12 (*)    Hemoglobin 10.5 (*)    HCT 31.0 (*)    All other components within normal limits  CULTURE, BLOOD (ROUTINE X 2)  CULTURE, BLOOD (ROUTINE X 2)  URINE CULTURE  URINE MICROSCOPIC-ADD ON  I-STAT CG4 LACTIC ACID, ED  I-STAT CG4  LACTIC ACID, ED    Imaging Review Dg Chest Port 1 View  09/27/2014   CLINICAL DATA:  Fever, hypertension.  EXAM: PORTABLE CHEST - 1 VIEW  COMPARISON:  March 23, 2014.  FINDINGS: The heart size and mediastinal contours are within normal limits. No acute pulmonary disease is noted. Right internal jugular Port-A-Cath is noted with distal tip overlying expected position of SVC. No pneumothorax or pleural effusion is noted. The visualized skeletal structures are unremarkable.  IMPRESSION: No acute cardiopulmonary abnormality seen.   Electronically Signed   By: Marijo Conception, M.D.   On: 09/27/2014 16:38     EKG Interpretation None      MDM   Final diagnoses:  Fever, unspecified fever cause    Patient with fever. Is on chemotherapy. She is not neutropenic. No clear source of the fever. Slight mid chest pain. Negative x-ray. Urinalysis reassuring. Normal lactic acid 2. Initially somewhat hypotensive but when up to her baseline of 90 systolic after fluid bolus. No nasal congestion or cough with sore throat worrisome for flu or strep. Blood and urine cultures sent. Will discharge home to follow-up with oncology.    Davonna Belling, MD 09/27/14 2040

## 2014-09-27 NOTE — Telephone Encounter (Signed)
Received vm from patient stating that she had a fever today of 102 orally.  Call back to patient and confirmed this. Instructed pt. To go to Ed @ [facility] for evaluation as she is currently receiving chemo therapy. Had Neulasta post 24 hrs. Post chemo on 09/19/14.  Instructed pt. To present her chemo alert card when she arrives at ED for quicker evaluation.  Pt. Voiced understanding.

## 2014-09-27 NOTE — Discharge Instructions (Signed)

## 2014-09-28 ENCOUNTER — Other Ambulatory Visit: Payer: Self-pay | Admitting: Oncology

## 2014-09-28 ENCOUNTER — Telehealth: Payer: Self-pay

## 2014-09-28 DIAGNOSIS — C541 Malignant neoplasm of endometrium: Secondary | ICD-10-CM

## 2014-09-28 LAB — URINE CULTURE: Colony Count: 30000

## 2014-09-28 NOTE — Telephone Encounter (Signed)
-----   Message from Gordy Levan, MD sent at 09/27/2014  9:06 PM EDT ----- In ED 4-12 with temp 102, not neutropenic. RN please check on her 40-13. I have one opening in AM 4-14 if needs to be seen then. We need to follow up cultures done in ED until final I believe she is to see urology on 4-13  thanks

## 2014-09-28 NOTE — Telephone Encounter (Signed)
[facility] and made a new appointment with Dr. Louis Meckel on May 5th at 1015.  This was the first available new patient appointment.

## 2014-09-28 NOTE — Telephone Encounter (Signed)
Spoke with Sheena Caldwell and she said that she feels better today getting some strength back.  Her chest hurt this am an took Aleve at 0800 with good effect. She states that she is having some chills. Not shaking chills.  She has not temperature today.  Took it with a battery operated  machine while on the phone.  Temp read 101 twice.  She states that she is taking in as much liquid as she can. Suggested she come in tomorrow to see Dr. Marko Plume to be evaluated, Blood cultures no growth  thus far and urine culture not resulted. Sheena Caldwell cancelled urology appointment for today  yesterday since she did not feel well yesterday. Appointment was with urologist  Dr. Diona Fanti.

## 2014-09-29 ENCOUNTER — Encounter: Payer: Self-pay | Admitting: Oncology

## 2014-09-29 ENCOUNTER — Other Ambulatory Visit (HOSPITAL_COMMUNITY)
Admission: RE | Admit: 2014-09-29 | Discharge: 2014-09-29 | Disposition: A | Discharge status: Home / Self Care | Payer: Medicaid Other | Source: Ambulatory Visit | Attending: Oncology | Admitting: Oncology

## 2014-09-29 ENCOUNTER — Telehealth: Payer: Self-pay | Admitting: Oncology

## 2014-09-29 ENCOUNTER — Other Ambulatory Visit (HOSPITAL_BASED_OUTPATIENT_CLINIC_OR_DEPARTMENT_OTHER): Payer: Medicaid Other

## 2014-09-29 ENCOUNTER — Ambulatory Visit (HOSPITAL_BASED_OUTPATIENT_CLINIC_OR_DEPARTMENT_OTHER): Payer: Medicaid Other | Admitting: Oncology

## 2014-09-29 VITALS — BP 92/62 | HR 101 | Temp 97.9°F | Resp 18 | Ht 66.5 in | Wt 141.9 lb

## 2014-09-29 DIAGNOSIS — G620 Drug-induced polyneuropathy: Secondary | ICD-10-CM

## 2014-09-29 DIAGNOSIS — R509 Fever, unspecified: Secondary | ICD-10-CM

## 2014-09-29 DIAGNOSIS — D649 Anemia, unspecified: Secondary | ICD-10-CM

## 2014-09-29 DIAGNOSIS — C541 Malignant neoplasm of endometrium: Secondary | ICD-10-CM | POA: Insufficient documentation

## 2014-09-29 DIAGNOSIS — D701 Agranulocytosis secondary to cancer chemotherapy: Secondary | ICD-10-CM

## 2014-09-29 DIAGNOSIS — T451X5A Adverse effect of antineoplastic and immunosuppressive drugs, initial encounter: Secondary | ICD-10-CM

## 2014-09-29 DIAGNOSIS — Z720 Tobacco use: Secondary | ICD-10-CM

## 2014-09-29 DIAGNOSIS — R319 Hematuria, unspecified: Secondary | ICD-10-CM

## 2014-09-29 DIAGNOSIS — F1290 Cannabis use, unspecified, uncomplicated: Secondary | ICD-10-CM

## 2014-09-29 DIAGNOSIS — F1099 Alcohol use, unspecified with unspecified alcohol-induced disorder: Secondary | ICD-10-CM

## 2014-09-29 LAB — COMPREHENSIVE METABOLIC PANEL
ALT: 56 U/L — AB (ref 0–35)
AST: 36 U/L (ref 0–37)
Albumin: 3.2 g/dL — ABNORMAL LOW (ref 3.5–5.2)
Alkaline Phosphatase: 171 U/L — ABNORMAL HIGH (ref 39–117)
Anion gap: 7 (ref 5–15)
BUN: 8 mg/dL (ref 6–23)
CALCIUM: 8.8 mg/dL (ref 8.4–10.5)
CO2: 28 mmol/L (ref 19–32)
Chloride: 103 mmol/L (ref 96–112)
Creatinine, Ser: 0.74 mg/dL (ref 0.50–1.10)
GFR, EST NON AFRICAN AMERICAN: 90 mL/min — AB (ref >90)
Glucose, Bld: 137 mg/dL — ABNORMAL HIGH (ref 70–99)
POTASSIUM: 3.7 mmol/L (ref 3.5–5.1)
Sodium: 138 mmol/L (ref 135–145)
Total Bilirubin: 1 mg/dL (ref 0.3–1.2)
Total Protein: 8 g/dL (ref 6.0–8.3)

## 2014-09-29 LAB — CBC WITH DIFFERENTIAL/PLATELET
BASO%: 0.2 % (ref 0.0–2.0)
Basophils Absolute: 0 10*3/uL (ref 0.0–0.1)
EOS ABS: 0 10*3/uL (ref 0.0–0.5)
EOS%: 0 % (ref 0.0–7.0)
HCT: 26.9 % — ABNORMAL LOW (ref 34.8–46.6)
HGB: 8.7 g/dL — ABNORMAL LOW (ref 11.6–15.9)
LYMPH#: 0.7 10*3/uL — AB (ref 0.9–3.3)
LYMPH%: 4.8 % — ABNORMAL LOW (ref 14.0–49.7)
MCH: 35.4 pg — ABNORMAL HIGH (ref 25.1–34.0)
MCHC: 32.4 g/dL (ref 31.5–36.0)
MCV: 109.4 fL — AB (ref 79.5–101.0)
MONO#: 0.5 10*3/uL (ref 0.1–0.9)
MONO%: 3.6 % (ref 0.0–14.0)
NEUT#: 13.6 10*3/uL — ABNORMAL HIGH (ref 1.5–6.5)
NEUT%: 91.4 % — ABNORMAL HIGH (ref 38.4–76.8)
PLATELETS: 127 10*3/uL — AB (ref 145–400)
RBC: 2.46 10*6/uL — AB (ref 3.70–5.45)
RDW: 16.7 % — ABNORMAL HIGH (ref 11.2–14.5)
WBC: 14.9 10*3/uL — ABNORMAL HIGH (ref 3.9–10.3)

## 2014-09-29 NOTE — Patient Instructions (Signed)
OK to use 1/2 of a Klonopin once a day occasionally if needed for anxiety  Take iron on empty stomach with orange juice or with vitamin C tablet, to help absorb iron  Always take aleve (naproxen) with food

## 2014-09-29 NOTE — Progress Notes (Signed)
OFFICE PROGRESS NOTE   September 29, 2014   [place] Carver Fila Hedgecock(PCP, [facility] [employer]), _ [name] [facility]). 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 Patient preferred not to keep planned MD appointment on 09-26-14, then was seen at ED with fever on 09-27-14.   Patient tolerated cycle 5 chemo without acute problems. She used regular antiemetics for a few days after treatment, with control of nausea, and no nausea now. Bowels have moved daily with exception of the day that she was seen in ED. She has no neuropathy in hands, but some persistent slight symptoms in toes, not interfering with activity and better with certain shoes.  Patient recalls that she was fatigued and felt weak on 09-24-14, did not check temperature but had some sweating at night. On 09-27-14 she had temp 102, seen at ED and believes that she had shaking chills at ED. She had some sternal and epigastric pain which has improved. She was given IVF plus IV zosyn and vancomycin in ED, CXR not remarkable and blood cultures x 2 and urine culture no growth to date. CMET had AP 140 with rest of LFTs normal, and she was not neutropenic. She has had no further fever or localizing symptoms of infection since 4-12 evaluation. She is eating and drinking fluids well now.    PAC in Flu vaccine done  ONCOLOGIC HISTORY Patient had been menopausal since age 63, then had one episode of vaginal bleeding early 2015. Later in 2015 she had persistent vaginal discharge, for which she was seen by PCP Brand Males (APP with [place] at [place] [facility]) in ~ 02-2014, had CTs in [facility] (reports to  be requested) and was referred to Dr Adella Nissen, gyn in [facility]. Endometrial biopsy had concern (that path not included in information available now) such that she was referred to Dr Jacquelyne Balint. Surgery by Dr Sabra Heck at [facility] in [place] on 04-06-14 was TAH, BSO, omentectomy, pelvic and right common iliac node evaluation; patient was hospitalized x 3 days and tells me that she recovered well from the surgery. Pathology [phone]) from 04-06-14 found high grade serous carcinoma involving entire thickness of myometrium (depth of invasion 1.1 cm out of 1.1 cm), with invasion of cervical stroma, no involvement of uterine serosa/bilateral tubes and ovaries/omentum, positive LVSI, 2/5 right pelvic nodes, 2 of 4 left pelvic nodes and 0/1 right common iliac node. Bulk of the carcinoma was in lower uterine segment where it was within 0.1 cm of serosal surface. Cytology positive for adenocarcinoma on washings ([id number]). Surgical findings were significant for no paraaortic adenopathy apparent. Patient has received 3 cycles of adjuvant chemotherapy in [facility] by Dr Sabra Heck, on 05-05-14, 05-26-14 and 07-29-14. Per records and patient, delay of cycle 3 was related to low counts and insurance changes. She received neulasta after chemotherapy on 07-29-14. Taxol was given at 175 mg/m2 for total dose was 280 mg cycle 1 and 291 mg for cycles 2 and 3; carboplatin was AUC = 6 with total dose 610 mg cycle 1, 620 mg cycle 2 and 630 mg cycle 3.. She does not recall using oral decadron premed for taxol, with premeds listed on chemo flowsheets standard decadron  20 mg, zofran 16 mg, benadryl 50 mg (which caused severe restless legs) and pepcid 20 mg. Outside information does not include serial blood counts. Last note from Dr Sabra Heck dated 07-29-14 describes unremarkable exam, "NED during chemotherapy and adequate PS". Patient was see by Dr Denman George 08-09-14, who recommends restaging scans after 6 cycles of chemotherapy and consideration  of vaginal brachytherapy; she has not been seen by radiation oncology in [place]. CT AP done in [facility] system 08-15-14 (due to elevated LFTs and blood in urine just after transfer of care) with soft tissue fullness at superior abdominal aorta and question of retroperitoneal necrotic adenopathy; plan repeat scans after complete present chemotherapy. Cycle 4 carbo taxol given 08-24-14 at [facility], with neulasta.    Review of systems as above, also: Vaginal itching resolved with monistat, which she is using again since antibiotics given in ED. Cries easily, which is not usual for her. Last used Klonopin for restless legs at hs ~ a week ago. Puritic, nonpainful rash left hip improving with hydrocortisone cream.  Remainder of 10 point Review of Systems negative.  Objective:  Vital signs in last 24 hours:  BP 92/62 mmHg  Pulse 101  Temp(Src) 97.9 F (36.6 C) (Oral)  Resp 18  Ht 5' 6.5" (1.689 m)  Wt 141 lb 14.4 oz (64.365 kg)  BMI 22.56 kg/m2 Weight is up 5 lbs. Blood pressure is in her usual range. Alert, oriented and appropriate. Ambulatory without assistance.  Alopecia  HEENT:PERRL, sclerae not icteric. Oral mucosa moist without lesions, posterior pharynx clear.  Neck supple. No JVD.  Lymphatics:no cervical,supraclavicular, axillary or inguinal adenopathy Resp: clear to auscultation bilaterally and normal percussion bilaterally Cardio: regular rate and rhythm. No gallop. GI: soft, nontender, not distended, no mass or organomegaly. Normally active bowel sounds. Surgical incision not remarkable. Musculoskeletal/ Extremities: without pitting edema, cords, tenderness Neuro: slight peripheral neuropathy toes as noted, otherwise nonfocal. PSYCH appropriate mood and affect, not tearful now Skin without rash, ecchymosis, petechiae  Portacath-minimal erythema no tenderness at suture line above access button now.  Lab Results:  Results for orders placed or performed during the hospital encounter  of 09/29/14  Comprehensive metabolic panel  Result Value Ref Range   Sodium 138 135 - 145 mmol/L   Potassium 3.7 3.5 - 5.1 mmol/L   Chloride 103 96 - 112 mmol/L   CO2 28 19 - 32 mmol/L   Glucose, Bld 137 (H) 70 - 99 mg/dL   BUN 8 6 - 23 mg/dL   Creatinine, Ser 0.74 0.50 - 1.10 mg/dL   Calcium 8.8 8.4 - 10.5 mg/dL   Total Protein 8.0 6.0 - 8.3 g/dL   Albumin 3.2 (L) 3.5 - 5.2 g/dL   AST 36 0 - 37 U/L   ALT 56 (H) 0 - 35 U/L   Alkaline Phosphatase 171 (H) 39 - 117 U/L   Total Bilirubin 1.0 0.3 - 1.2 mg/dL   GFR calc non Af Amer 90 (L) >90 mL/min   GFR calc Af Amer >90 >90 mL/min   Anion gap 7 5 - 15    CBC today WBC 14.9 with neulasta, ANC 13.6, Hemoglobin down to 8.7, plt 127k  Blood cultures x2 and urine culture from 09-27-14 no growth to date  Studies/Results:  Dg Chest Port 1 View  09/27/2014   CLINICAL DATA:  Fever, hypertension.  EXAM: PORTABLE CHEST - 1 VIEW  COMPARISON:  March 23, 2014.  FINDINGS: The heart size and mediastinal contours are within normal limits. No acute pulmonary  disease is noted. Right internal jugular Port-A-Cath is noted with distal tip overlying expected position of SVC. No pneumothorax or pleural effusion is noted. The visualized skeletal structures are unremarkable.  IMPRESSION: No acute cardiopulmonary abnormality seen.   Electronically Signed   By: Marijo Conception, M.D.   On: 09/27/2014 16:38    Medications: I have reviewed the patient's current medications. OK to use 1/2 of klonopin on occasional basis for anxiety  DISCUSSION: etiology of fever day 12 cycle 5 chemo not clear, with thorough evaluation at ED then. She knows to call if other problems prior to last planned chemo on 10-06-14. She will have neulasta on 4-25 and I will see her on 10-17-14 - needs to keep that appointment. Discussed sternal pain from neulasta.  Assessment/Plan: 1.IIIC1 high grade serous endometrial carcinoma: Post optimal debulking by Dr Jacquelyne Balint in [place]  04-06-14  and 3 cycles of adjuvant taxol carboplatin from 05-05-14 thru 07-29-13. Question of retroperitoneal and para-aortic adenopathy by CT 08-15-14. For last planned chemo with cycle 6 carbo taxol on 4-22 as long as ANC >=1.5, plt >=100k and otherwise stable when I see her on 4-21. 2.temperature 102 on day 13 cycle 5: evaulation as above, resolved. 3.elevated bilirubin and LFTs noted day 15 cycle 3: possibly related to azithromycin.  4.blood in urine 08-19-14 and moderated hemoglobin in ED specimen 09-27-14., culture negative, no obvious nephrolithiasis by CT. She missed urology appointment due to other problems last week, that rescheduled. 5.anemia: ron low normal, resumed iron supplements. Likely multifactorial including chemo, possibly ETOH, some hematuria.. May need PRBCs if continues to drop with last chemo upcoming. 6.PAC in, suture area improved 7.long tobacco recently DCd. Stable pulmonary nodules on CT. 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 Follow, lots of exercise. 11.overdue mammograms, which we will address when possible 12. Restless legs with benadryl, dose decreased. Uses prn klonopin  13. No acute dental problems, no dentist. 14.baseline low BP per outside medical records.Patient is aware that she needs to push po fluids after chemo 15.vaginal itching: resolved with monistat after she stopped using concomitant hydrocortisone     All questions answered. Patient knows to call if concerns prior to next scheduled visit. Time spent 30 min including >50% counseling and coordination of care.   LIVESAY,LENNIS P, MD   09/29/2014, 10:00 AM

## 2014-09-29 NOTE — Telephone Encounter (Signed)
per pof ot sch pt appt-gave pt copy of sch °

## 2014-10-01 DIAGNOSIS — R509 Fever, unspecified: Secondary | ICD-10-CM | POA: Insufficient documentation

## 2014-10-02 ENCOUNTER — Other Ambulatory Visit: Payer: Self-pay | Admitting: Oncology

## 2014-10-03 LAB — CULTURE, BLOOD (ROUTINE X 2)
CULTURE: NO GROWTH
Culture: NO GROWTH

## 2014-10-06 ENCOUNTER — Encounter: Payer: Self-pay | Admitting: Oncology

## 2014-10-06 ENCOUNTER — Ambulatory Visit (HOSPITAL_COMMUNITY)
Admission: RE | Admit: 2014-10-06 | Discharge: 2014-10-06 | Disposition: A | Discharge status: Home / Self Care | Payer: Medicaid Other | Source: Ambulatory Visit | Attending: Oncology | Admitting: Oncology

## 2014-10-06 ENCOUNTER — Other Ambulatory Visit: Payer: Medicaid Other

## 2014-10-06 ENCOUNTER — Ambulatory Visit (HOSPITAL_BASED_OUTPATIENT_CLINIC_OR_DEPARTMENT_OTHER): Payer: Medicaid Other | Admitting: Oncology

## 2014-10-06 ENCOUNTER — Ambulatory Visit (HOSPITAL_BASED_OUTPATIENT_CLINIC_OR_DEPARTMENT_OTHER): Payer: Medicaid Other

## 2014-10-06 ENCOUNTER — Other Ambulatory Visit (HOSPITAL_BASED_OUTPATIENT_CLINIC_OR_DEPARTMENT_OTHER): Payer: Medicaid Other

## 2014-10-06 VITALS — BP 97/66 | HR 74 | Temp 98.9°F | Resp 18 | Ht 66.5 in | Wt 138.3 lb

## 2014-10-06 DIAGNOSIS — I959 Hypotension, unspecified: Secondary | ICD-10-CM

## 2014-10-06 DIAGNOSIS — R945 Abnormal results of liver function studies: Secondary | ICD-10-CM

## 2014-10-06 DIAGNOSIS — R319 Hematuria, unspecified: Secondary | ICD-10-CM

## 2014-10-06 DIAGNOSIS — D6481 Anemia due to antineoplastic chemotherapy: Secondary | ICD-10-CM

## 2014-10-06 DIAGNOSIS — Z95828 Presence of other vascular implants and grafts: Secondary | ICD-10-CM

## 2014-10-06 DIAGNOSIS — R779 Abnormality of plasma protein, unspecified: Secondary | ICD-10-CM

## 2014-10-06 DIAGNOSIS — C541 Malignant neoplasm of endometrium: Secondary | ICD-10-CM

## 2014-10-06 DIAGNOSIS — M1990 Unspecified osteoarthritis, unspecified site: Secondary | ICD-10-CM

## 2014-10-06 DIAGNOSIS — D649 Anemia, unspecified: Secondary | ICD-10-CM | POA: Diagnosis not present

## 2014-10-06 DIAGNOSIS — G620 Drug-induced polyneuropathy: Secondary | ICD-10-CM

## 2014-10-06 DIAGNOSIS — G629 Polyneuropathy, unspecified: Secondary | ICD-10-CM

## 2014-10-06 DIAGNOSIS — E8809 Other disorders of plasma-protein metabolism, not elsewhere classified: Secondary | ICD-10-CM

## 2014-10-06 DIAGNOSIS — F1099 Alcohol use, unspecified with unspecified alcohol-induced disorder: Secondary | ICD-10-CM

## 2014-10-06 DIAGNOSIS — F329 Major depressive disorder, single episode, unspecified: Secondary | ICD-10-CM

## 2014-10-06 DIAGNOSIS — T451X5A Adverse effect of antineoplastic and immunosuppressive drugs, initial encounter: Secondary | ICD-10-CM

## 2014-10-06 DIAGNOSIS — F1290 Cannabis use, unspecified, uncomplicated: Secondary | ICD-10-CM

## 2014-10-06 LAB — COMPREHENSIVE METABOLIC PANEL (CC13)
ALT: 76 U/L — AB (ref 0–55)
AST: 48 U/L — AB (ref 5–34)
Albumin: 2.5 g/dL — ABNORMAL LOW (ref 3.5–5.0)
Alkaline Phosphatase: 275 U/L — ABNORMAL HIGH (ref 40–150)
Anion Gap: 13 mEq/L — ABNORMAL HIGH (ref 3–11)
BUN: 8.6 mg/dL (ref 7.0–26.0)
CALCIUM: 9.5 mg/dL (ref 8.4–10.4)
CHLORIDE: 100 meq/L (ref 98–109)
CO2: 24 meq/L (ref 22–29)
Creatinine: 0.7 mg/dL (ref 0.6–1.1)
EGFR: >90 mL/min/{1.73_m2} (ref >90)
Glucose: 126 mg/dl (ref 70–140)
POTASSIUM: 4.4 meq/L (ref 3.5–5.1)
SODIUM: 137 meq/L (ref 136–145)
Total Bilirubin: 0.53 mg/dL (ref 0.20–1.20)
Total Protein: 8.8 g/dL — ABNORMAL HIGH (ref 6.4–8.3)

## 2014-10-06 LAB — CBC WITH DIFFERENTIAL/PLATELET
BASO%: 0.5 % (ref 0.0–2.0)
Basophils Absolute: 0 10*3/uL (ref 0.0–0.1)
EOS%: 0.3 % (ref 0.0–7.0)
Eosinophils Absolute: 0 10*3/uL (ref 0.0–0.5)
HCT: 24.9 % — ABNORMAL LOW (ref 34.8–46.6)
HGB: 8.1 g/dL — ABNORMAL LOW (ref 11.6–15.9)
LYMPH%: 19.8 % (ref 14.0–49.7)
MCH: 35.6 pg — ABNORMAL HIGH (ref 25.1–34.0)
MCHC: 32.5 g/dL (ref 31.5–36.0)
MCV: 109.5 fL — AB (ref 79.5–101.0)
MONO#: 0.4 10*3/uL (ref 0.1–0.9)
MONO%: 7.9 % (ref 0.0–14.0)
NEUT%: 71.5 % (ref 38.4–76.8)
NEUTROS ABS: 3.5 10*3/uL (ref 1.5–6.5)
Platelets: 194 10*3/uL (ref 145–400)
RBC: 2.28 10*6/uL — AB (ref 3.70–5.45)
RDW: 16.7 % — ABNORMAL HIGH (ref 11.2–14.5)
WBC: 4.9 10*3/uL (ref 3.9–10.3)
lymph#: 1 10*3/uL (ref 0.9–3.3)

## 2014-10-06 LAB — ABO/RH: ABO/RH(D): O POS

## 2014-10-06 MED ORDER — SODIUM CHLORIDE 0.9 % IJ SOLN
10.0000 mL | INTRAMUSCULAR | Status: DC | PRN
  Administered 2014-10-06: 10 mL via INTRAVENOUS
  Filled 2014-10-06: qty 10

## 2014-10-06 MED ORDER — HEPARIN SOD (PORK) LOCK FLUSH 100 UNIT/ML IV SOLN
500.0000 [IU] | Freq: Once | INTRAVENOUS | Status: AC
  Administered 2014-10-06: 500 [IU] via INTRAVENOUS
  Filled 2014-10-06: qty 5

## 2014-10-06 NOTE — Patient Instructions (Signed)
You will be given 2 units of blood on 4-22 instead of chemo that day. No neulasta shot on 4-25.  OK to start back on naproxen 1 or 2 times daily. Drink plenty of fluids with this and take each dose with food. This should help aching.  We will decide whether to do the last planned chemotherapy treatment when you see Dr Marko Plume again on 10-17-14.

## 2014-10-06 NOTE — Patient Instructions (Signed)

## 2014-10-06 NOTE — Progress Notes (Signed)
OFFICE PROGRESS NOTE   October 06, 2014   [place] Carver Fila Hedgecock(PCP, [facility] [employer]), _ [name] [facility]). New patient appointment scheduled to urology 10-17-14   INTERVAL HISTORY:  Patient is seen, alone for visit, in continuing attention to adjuvant carboplatin taxol which she is receiving for IIIC high grade serous endometrial carcinoma, due cycle 6 on 10-07-14. With multiple complaints and concerns today, will delay cycle 6. Plan repeat imaging after cycle 6. She is to see urologist for microscopic hematuria in at least 3 urine specimens since 07-2014, no positive cultures. Note long tobacco recently DCd.   Patient complains of weakness when she stands, which is interfering with ability to manage alone at home. She denies overt bleeding; she continues oral iron. She recalls that she was transfused with surgery in 03-2014. She has been drinking Ensure, including this AM. She complains of hurting all over, especially shoulders and scapulae, apparently since she has been off naproxen. She has been crying easily; she tells me that she tried an antidepressant ~ 10 years ago, did not like how she felt with this and discontinued it (does not know name). She denies fever. Bowels are moving. She is not nauseated now. She denies SOB at rest or chest pain. 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  PAC in Flu vaccine done   ONCOLOGIC HISTORY Patient had been menopausal since age 89, then had one episode of vaginal bleeding early 2015. Later in 2015 she had persistent vaginal discharge, for which she was seen by PCP Brand Males (APP with [place] [facility] [place] [facility]) in ~ 02-2014, had CTs in [facility] (reports to be requested) and was referred to Dr Adella Nissen, gyn in [facility]. Endometrial biopsy had concern (that path not included in information available now) such that she was  referred to Dr Jacquelyne Balint. Surgery by Dr Sabra Heck at [facility] in [place] on 04-06-14 was TAH, BSO, omentectomy, pelvic and right common iliac node evaluation; patient was hospitalized x 3 days and tells me that she recovered well from the surgery. Pathology [id number]) from 04-06-14 found high grade serous carcinoma involving entire thickness of myometrium (depth of invasion 1.1 cm out of 1.1 cm), with invasion of cervical stroma, no involvement of uterine serosa/bilateral tubes and ovaries/omentum, positive LVSI, 2/5 right pelvic nodes, 2 of 4 left pelvic nodes and 0/1 right common iliac node. Bulk of the carcinoma was in lower uterine segment where it was within 0.1 cm of serosal surface. Cytology positive for adenocarcinoma on washings ([id number]). Surgical findings were significant for no paraaortic adenopathy apparent. Patient has received 3 cycles of adjuvant chemotherapy in [facility] by Dr Sabra Heck, on 05-05-14, 05-26-14 and 07-29-14. Per records and patient, delay of cycle 3 was related to low counts and insurance changes. She received neulasta after chemotherapy on 07-29-14. Taxol was given at 175 mg/m2 for total dose was 280 mg cycle 1 and 291 mg for cycles 2 and 3; carboplatin was AUC = 6 with total dose 610 mg cycle 1, 620 mg cycle 2 and 630 mg cycle 3.. She does not recall using oral decadron premed for taxol, with premeds listed on chemo flowsheets standard decadron 20 mg, zofran 16 mg, benadryl 50 mg (which caused severe restless legs) and pepcid 20 mg. Outside information does not include serial blood counts. Last note from Dr Sabra Heck dated 07-29-14 describes unremarkable exam, "NED during chemotherapy and adequate PS". Patient was  see by Dr Denman George 08-09-14, who recommends restaging scans after 6 cycles of chemotherapy and consideration of vaginal brachytherapy; she has not been seen by radiation oncology in [place]. CT AP done in [facility] system 08-15-14 (due to elevated LFTs and blood in urine just after  transfer of care) with soft tissue fullness at superior abdominal aorta and question of retroperitoneal necrotic adenopathy; plan repeat scans after complete present chemotherapy. Cycle 4 carbo taxol given [facility]-24-14 [facility], with neulasta.     Review of systems as above, also:  Hit dorsum left foot on furniture, initially swollen and painful. No LE swelling. No problems with PAC. No dysuria or gross hematuria Remainder of 10 point Review of Systems negative.  Objective:  Vital signs in last 24 hours:  BP 97/66 mmHg  Pulse 74  Temp(Src) 98.9 F (37.2 C) (Oral)  Resp 18  Ht 5' 6.5" (1.689 m)  Wt 138 lb 4.8 oz (62.732 kg)  BMI 21.99 kg/m2  SpO2 98% Weight down 4 lbs from 09-29-14. Alert, oriented, cooperative and appropriate conversation. Crying at times.Ambulatory without difficulty.  Alopecia  HEENT:PERRL, sclerae not icteric. Oral mucosa moist without lesions, posterior pharynx clear.  Neck supple. No JVD.  Lymphatics:no cervical,supraclavicular adenopathy Resp: clear to auscultation bilaterally and normal percussion bilaterally Cardio: regular rate and rhythm. No gallop. GI: soft, nontender, not distended, no mass or organomegaly. Normally active bowel sounds. Surgical incision not remarkable. Musculoskeletal/ Extremities: without pitting edema, cords, tenderness. Left foot not swollen, minimal resolving ecchymosis. Neuro: peripheral neuropathy feet > hands.. Otherwise nonfocal. PSYCH tearful at times Skin otherwise without rash, ecchymosis, petechiae. Portacath-without erythema or tenderness  Lab Results:  Results for orders placed or performed in visit on 10/06/14  CBC with Differential  Result Value Ref Range   WBC 4.9 3.9 - 10.3 10e3/uL   NEUT# 3.5 1.5 - 6.5 10e3/uL   HGB 8.1 (L) 11.6 - 15.9 g/dL   HCT 24.9 (L) 34.8 - 46.6 %   Platelets 194 145 - 400 10e3/uL   MCV 109.5 (H) 79.5 - 101.0 fL   MCH 35.6 (H) 25.1 - 34.0 pg   MCHC 32.5 31.5 - 36.0 g/dL   RBC 2.28 (L)  3.70 - 5.45 10e6/uL   RDW 16.7 (H) 11.2 - 14.5 %   lymph# 1.0 0.9 - 3.3 10e3/uL   MONO# 0.4 0.1 - 0.9 10e3/uL   Eosinophils Absolute 0.0 0.0 - 0.5 10e3/uL   Basophils Absolute 0.0 0.0 - 0.1 10e3/uL   NEUT% 71.5 38.4 - 76.8 %   LYMPH% 19.8 14.0 - 49.7 %   MONO% 7.9 0.0 - 14.0 %   EOS% 0.3 0.0 - 7.0 %   BASO% 0.5 0.0 - 2.0 %  Comprehensive metabolic panel (Cmet) - [facility]  Result Value Ref Range   Sodium 137 136 - 145 mEq/L   Potassium 4.4 3.5 - 5.1 mEq/L   Chloride 100 98 - 109 mEq/L   CO2 24 22 - 29 mEq/L   Glucose 126 70 - 140 mg/dl   BUN 8.6 7.0 - 26.0 mg/dL   Creatinine 0.7 0.6 - 1.1 mg/dL   Total Bilirubin 0.53 0.20 - 1.20 mg/dL   Alkaline Phosphatase 275 (H) 40 - 150 U/L   AST 48 (H) 5 - 34 U/L   ALT 76 (H) 0 - 55 U/L   Total Protein 8.8 (H) 6.4 - 8.3 g/dL   Albumin 2.5 (L) 3.5 - 5.0 g/dL   Calcium 9.5 8.4 - 10.4 mg/dL   Anion Gap 13 (  H) 3 - 11 mEq/L   EGFR >90 >90 ml/min/1.73 m2     Studies/Results:  Last CT AP in [facility] 08-15-14 with possible retroperitoneal adenopathy and soft tissue at superior abdominal aorta.   Medications: I have reviewed the patient's current medications. She will resume naproxen. Contniue po iron. Hold planned cycle 6 chemo today.  DISCUSSION: I have recommended transfusion 2 units PRBCs instead of chemo on 10-07-14, and asked her to resume naproxen, as both of these likely will make her more comfortable and improve functional status. If she continues with depression symptoms after these, PCP likely best to consider antidepressant. I will see her back as scheduled on 10-17-14 and we will decide then whether to proceed with cycle 6 or do restaging scans then. Patient is in full agreement with these plans. Type and crossmatch today. Transfusion orders entered.   Assessment/Plan:  1.IIIC1 high grade serous endometrial carcinoma: Post optimal debulking by Dr Jacquelyne Balint in [place] 04-06-14 and 3 cycles of adjuvant taxol carboplatin from  05-05-14 thru 07-29-13. Question of retroperitoneal and para-aortic adenopathy by CT 08-15-14. Hold last planned chemo (cycle 6) and will decide at visit 10-17-14 if able to proceed. Scans after chemo completes and should see gyn oncology again then. 2.symptomatic anemia: transfuse PRBCs tomorrow 3.elevated bilirubin and LFTs noted day 15 cycle 3: possibly related to azithromycin.  4.blood in urine 08-15-14, 08-19-14 and moderated hemoglobin in ED specimen 09-27-14., culture negative, no obvious nephrolithiasis by CT. Urology appointment rescheduled. 5.baseline low BP per outside medical records.Patient is aware that she needs to push po fluids after chem 6.PAC in, suture area improved 7.long tobacco recently DCd. Stable pulmonary nodules on CT. 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 Follow, lots of exercise. 11.overdue mammograms, which we will address when possible 12. Restless legs with benadryl, dose decreased. Uses prn klonopin  13. Depression: if symptoms persist will ask PCP to follow up 14.elevated protein, low albumin: will check SPEP. Did not discuss now. 15.arthritis: resume naproxen    Patient understands discussion and is in agreement with recommendations and plans, including consent for PRBCs. She is not crying at completion of visit. Time spent 25 min including >50% counseling and coordination of care.  LIVESAY,LENNIS P, MD   10/06/2014, 1:34 PM

## 2014-10-07 ENCOUNTER — Ambulatory Visit (HOSPITAL_BASED_OUTPATIENT_CLINIC_OR_DEPARTMENT_OTHER): Payer: Medicaid Other

## 2014-10-07 VITALS — BP 105/65 | HR 69 | Temp 98.1°F | Resp 20

## 2014-10-07 DIAGNOSIS — D649 Anemia, unspecified: Secondary | ICD-10-CM | POA: Diagnosis present

## 2014-10-07 DIAGNOSIS — C541 Malignant neoplasm of endometrium: Secondary | ICD-10-CM | POA: Diagnosis not present

## 2014-10-07 LAB — PREPARE RBC (CROSSMATCH)

## 2014-10-07 MED ORDER — SODIUM CHLORIDE 0.9 % IJ SOLN
10.0000 mL | INTRAMUSCULAR | Status: AC | PRN
  Administered 2014-10-07: 10 mL
  Filled 2014-10-07: qty 10

## 2014-10-07 MED ORDER — ACETAMINOPHEN 325 MG PO TABS
325.0000 mg | ORAL_TABLET | Freq: Once | ORAL | Status: AC
  Administered 2014-10-07: 325 mg via ORAL

## 2014-10-07 MED ORDER — HEPARIN SOD (PORK) LOCK FLUSH 100 UNIT/ML IV SOLN
500.0000 [IU] | Freq: Every day | INTRAVENOUS | Status: AC | PRN
  Administered 2014-10-07: 500 [IU]
  Filled 2014-10-07: qty 5

## 2014-10-07 MED ORDER — SODIUM CHLORIDE 0.9 % IV SOLN
250.0000 mL | Freq: Once | INTRAVENOUS | Status: AC
  Administered 2014-10-07: 250 mL via INTRAVENOUS

## 2014-10-07 MED ORDER — ACETAMINOPHEN 325 MG PO TABS
ORAL_TABLET | ORAL | Status: AC
  Filled 2014-10-07: qty 1

## 2014-10-07 NOTE — Patient Instructions (Signed)

## 2014-10-08 DIAGNOSIS — C541 Malignant neoplasm of endometrium: Secondary | ICD-10-CM | POA: Insufficient documentation

## 2014-10-08 DIAGNOSIS — D6481 Anemia due to antineoplastic chemotherapy: Secondary | ICD-10-CM | POA: Insufficient documentation

## 2014-10-08 DIAGNOSIS — I959 Hypotension, unspecified: Secondary | ICD-10-CM | POA: Insufficient documentation

## 2014-10-08 DIAGNOSIS — E8809 Other disorders of plasma-protein metabolism, not elsewhere classified: Secondary | ICD-10-CM | POA: Insufficient documentation

## 2014-10-08 DIAGNOSIS — R779 Abnormality of plasma protein, unspecified: Secondary | ICD-10-CM | POA: Insufficient documentation

## 2014-10-08 DIAGNOSIS — T451X5A Adverse effect of antineoplastic and immunosuppressive drugs, initial encounter: Secondary | ICD-10-CM

## 2014-10-08 LAB — TYPE AND SCREEN
ABO/RH(D): O POS
ANTIBODY SCREEN: NEGATIVE
Unit division: 0
Unit division: 0

## 2014-10-09 ENCOUNTER — Telehealth: Payer: Self-pay

## 2014-10-09 NOTE — Telephone Encounter (Signed)
-----   Message from Gordy Levan, MD sent at 10/08/2014  4:18 PM EDT ----- Please send my note from 4-21 to whichever urologist at [facility] is to see her new ~ 5-2 thanks

## 2014-10-09 NOTE — Telephone Encounter (Signed)
Electronically sent Dr. Mariana Kaufman office note from 10-06-14 visit to Dr. Louis Meckel as requested below by Dr. Marko Plume for Ms. Sheena Caldwell  New Patient visit on 10-20-14 at 1015.

## 2014-10-10 ENCOUNTER — Ambulatory Visit: Payer: Medicaid Other

## 2014-10-16 ENCOUNTER — Other Ambulatory Visit: Payer: Self-pay | Admitting: Oncology

## 2014-10-17 ENCOUNTER — Ambulatory Visit (HOSPITAL_BASED_OUTPATIENT_CLINIC_OR_DEPARTMENT_OTHER): Payer: Medicaid Other | Admitting: Oncology

## 2014-10-17 ENCOUNTER — Ambulatory Visit (HOSPITAL_BASED_OUTPATIENT_CLINIC_OR_DEPARTMENT_OTHER): Payer: Medicaid Other

## 2014-10-17 ENCOUNTER — Encounter: Payer: Self-pay | Admitting: Oncology

## 2014-10-17 ENCOUNTER — Telehealth: Payer: Self-pay | Admitting: Oncology

## 2014-10-17 VITALS — BP 106/80 | HR 79 | Temp 98.2°F | Resp 18 | Ht 66.5 in | Wt 141.0 lb

## 2014-10-17 DIAGNOSIS — C541 Malignant neoplasm of endometrium: Secondary | ICD-10-CM

## 2014-10-17 DIAGNOSIS — I959 Hypotension, unspecified: Secondary | ICD-10-CM

## 2014-10-17 DIAGNOSIS — R911 Solitary pulmonary nodule: Secondary | ICD-10-CM

## 2014-10-17 DIAGNOSIS — R319 Hematuria, unspecified: Secondary | ICD-10-CM

## 2014-10-17 DIAGNOSIS — D649 Anemia, unspecified: Secondary | ICD-10-CM | POA: Diagnosis not present

## 2014-10-17 DIAGNOSIS — G620 Drug-induced polyneuropathy: Secondary | ICD-10-CM | POA: Diagnosis not present

## 2014-10-17 DIAGNOSIS — T451X5A Adverse effect of antineoplastic and immunosuppressive drugs, initial encounter: Secondary | ICD-10-CM

## 2014-10-17 DIAGNOSIS — Z87891 Personal history of nicotine dependence: Secondary | ICD-10-CM

## 2014-10-17 DIAGNOSIS — E8809 Other disorders of plasma-protein metabolism, not elsewhere classified: Secondary | ICD-10-CM

## 2014-10-17 DIAGNOSIS — F1290 Cannabis use, unspecified, uncomplicated: Secondary | ICD-10-CM

## 2014-10-17 DIAGNOSIS — Z95828 Presence of other vascular implants and grafts: Secondary | ICD-10-CM

## 2014-10-17 DIAGNOSIS — Z720 Tobacco use: Secondary | ICD-10-CM

## 2014-10-17 DIAGNOSIS — R799 Abnormal finding of blood chemistry, unspecified: Secondary | ICD-10-CM

## 2014-10-17 DIAGNOSIS — R779 Abnormality of plasma protein, unspecified: Secondary | ICD-10-CM

## 2014-10-17 LAB — CBC WITH DIFFERENTIAL/PLATELET
BASO%: 0.1 % (ref 0.0–2.0)
BASOS ABS: 0 10*3/uL (ref 0.0–0.1)
EOS%: 2 % (ref 0.0–7.0)
Eosinophils Absolute: 0.1 10*3/uL (ref 0.0–0.5)
HCT: 33.4 % — ABNORMAL LOW (ref 34.8–46.6)
HGB: 10.7 g/dL — ABNORMAL LOW (ref 11.6–15.9)
LYMPH#: 1.6 10*3/uL (ref 0.9–3.3)
LYMPH%: 36.1 % (ref 14.0–49.7)
MCH: 33.2 pg (ref 25.1–34.0)
MCHC: 31.9 g/dL (ref 31.5–36.0)
MCV: 103.9 fL — ABNORMAL HIGH (ref 79.5–101.0)
MONO#: 0.3 10*3/uL (ref 0.1–0.9)
MONO%: 7.7 % (ref 0.0–14.0)
NEUT#: 2.5 10*3/uL (ref 1.5–6.5)
NEUT%: 54.1 % (ref 38.4–76.8)
Platelets: 390 10*3/uL (ref 145–400)
RBC: 3.21 10*6/uL — AB (ref 3.70–5.45)
RDW: 20.7 % — AB (ref 11.2–14.5)
WBC: 4.5 10*3/uL (ref 3.9–10.3)

## 2014-10-17 LAB — COMPREHENSIVE METABOLIC PANEL (CC13)
ALK PHOS: 152 U/L — AB (ref 40–150)
ALT: 22 U/L (ref 0–55)
AST: 16 U/L (ref 5–34)
Albumin: 3 g/dL — ABNORMAL LOW (ref 3.5–5.0)
Anion Gap: 9 mEq/L (ref 3–11)
BILIRUBIN TOTAL: 0.21 mg/dL (ref 0.20–1.20)
BUN: 14.4 mg/dL (ref 7.0–26.0)
CALCIUM: 9.8 mg/dL (ref 8.4–10.4)
CO2: 29 mEq/L (ref 22–29)
Chloride: 104 mEq/L (ref 98–109)
Creatinine: 0.7 mg/dL (ref 0.6–1.1)
EGFR: >90 mL/min/{1.73_m2} (ref >90)
Glucose: 67 mg/dl — ABNORMAL LOW (ref 70–140)
POTASSIUM: 4.1 meq/L (ref 3.5–5.1)
Sodium: 142 mEq/L (ref 136–145)
Total Protein: 8.7 g/dL — ABNORMAL HIGH (ref 6.4–8.3)

## 2014-10-17 NOTE — Progress Notes (Signed)
OFFICE PROGRESS NOTE   Oct 17, 2014   Physicians:[place] Carver Fila Hedgecock(PCP, [facility] [employer]), _ [name] [facility]). New patient appointment scheduled to urology Dr B.Herrick 10-20-14  INTERVAL HISTORY:  Patient is seen, alone for visit, in continuing attention to adjuvant chemotherapy in process for IIIC high grade serous endometrial carcinoma, cycle 6 delayed from 10-07-14 due to multiple issues and concerns then. She received 2 units of PRBCs on 4-22 for hemoglobin 8.1  resumed naproxen and discontinued clonazepam, all of which seem to have been helpful. She has appointment for follow up with Dr Hazle Nordmann in [facility] office on 11-09-14. We plan repeat imaging prior to that appointment. Note question of soft tissue prominence at superior abdominal aorta and retroperitoneal adenopathy on CT  07-2014.  She has not had radiation therapy consultation or treatment.  Patient is feeling much better other than significant, continuous taxol neuropathy involving distal half of feet bilaterally, tho only occasional in hands. Energy is better enough that she was able to drive herself to office for first time. She has had no nausea, good po intake and bowels are moving daily. She has not seen bleeding, including hematuria, but has had microscopic hematuria on several occasions and will keep appointment with urology upcoming. SOB is improved. The uncontrollable crying has stopped since DC clonazepam.    PAC in Flu vaccine done No genetics testing  ONCOLOGIC HISTORY  Patient had one episode of post menopausal vaginal bleeding early 2015. Later in 2015 she had persistent vaginal discharge, for which she was seen by PCP Suzann Hedgecock  in ~ 02-2014, had CTs in [facility] and was referred to Dr Adella Nissen, gyn in [facility]. Endometrial biopsy had concern (that path not included in information sent for consultation) such that she was  referred to Dr Jacquelyne Balint. Surgery by Dr Sabra Heck at [facility] in [place] on 04-06-14 was TAH, BSO, omentectomy, pelvic and right common iliac node evaluation; patient was hospitalized x 3 days and tells me that she recovered well from the surgery. Pathology [id number]) from 04-06-14 found high grade serous carcinoma involving entire thickness of myometrium (depth of invasion 1.1 cm out of 1.1 cm), with invasion of cervical stroma, no involvement of uterine serosa/bilateral tubes and ovaries/omentum, positive LVSI, 2/5 right pelvic nodes, 2 of 4 left pelvic nodes and 0/1 right common iliac node. Bulk of the carcinoma was in lower uterine segment where it was within 0.1 cm of serosal surface. Cytology positive for adenocarcinoma on washings ([id number]). Surgical findings were significant for no paraaortic adenopathy apparent. Patient has received 3 cycles of adjuvant chemotherapy in [facility] by Dr Sabra Heck, on 05-05-14, 05-26-14 and 07-29-14. Per records and patient, delay of cycle 3 was related to low counts and insurance changes. She received neulasta after chemotherapy on 07-29-14. Taxol was given at 175 mg/m2 for total dose was 280 mg cycle 1 and 291 mg for cycles 2 and 3; carboplatin was AUC = 6 with total dose 610 mg cycle 1, 620 mg cycle 2 and 630 mg cycle 3.. She does not recall using oral decadron premed for taxol, with premeds listed on chemo flowsheets standard decadron 20 mg, zofran 16 mg, benadryl 50 mg (which caused severe restless legs) and pepcid 20 mg. Outside information does not include serial blood counts. Last note from Dr Sabra Heck dated 07-29-14 describes unremarkable exam, "NED during chemotherapy and adequate PS". Patient was see by Dr Denman George 08-09-14, who recommends restaging scans  after 6 cycles of chemotherapy and consideration of vaginal brachytherapy; she has not been seen by radiation oncology in [place]. CT AP done in [facility] 08-15-14 (due to elevated LFTs and blood in urine just after  transfer of care) with soft tissue fullness at superior abdominal aorta and question of retroperitoneal necrotic adenopathy; plan repeat scans after complete present chemotherapy. Cycle 4 carbo taxol given 08-24-14 at [facility], with neulasta.Cycle 6 was delayed with symptomatic anemia and other issues, given 10-21-14 as carboplatin only due to peripheral neuropathy in feet.    Review of systems as above, also: No fever or symptoms of infection. Is able to sleep. No LE swelling. No abdominal or pelvic pain. Generalized aching resolved when she resumed naproxen. Remainder of 10 point Review of Systems negative.  Objective:  Vital signs in last 24 hours:  BP 106/80 mmHg  Pulse 79  Temp(Src) 98.2 F (36.8 C) (Oral)  Resp 18  Ht 5' 6.5" (1.689 m)  Wt 141 lb (63.957 kg)  BMI 22.42 kg/m2 Weight is up 3 lbs Alert, oriented and appropriate. Ambulatory without difficulty, wearing tennis shoes. Alopecia  HEENT:PERRL, sclerae not icteric. Oral mucosa moist without lesions, posterior pharynx clear.  Neck supple. No JVD.  Lymphatics:no cervical,supraclavicular or inguinal adenopathy Resp: clear to auscultation bilaterally and normal percussion bilaterally Cardio: regular rate and rhythm. No gallop. GI: soft, nontender, not distended, no mass or organomegaly. Normally active bowel sounds. Surgical incision not remarkable. Musculoskeletal/ Extremities: without pitting edema, cords, tenderness Neuro: peripheral neuropathy involving distal half of feet bilaterally. Otherwise nonfocal. PSYCH affect much brighter, no crying Skin without rash, ecchymosis, petechiae Portacath-without erythema or tenderness  Lab Results:  Results for orders placed or performed in visit on 10/17/14  CBC with Differential  Result Value Ref Range   WBC 4.5 3.9 - 10.3 10e3/uL   NEUT# 2.5 1.5 - 6.5 10e3/uL   HGB 10.7 (L) 11.6 - 15.9 g/dL   HCT 33.4 (L) 34.8 - 46.6 %   Platelets 390 145 - 400 10e3/uL   MCV 103.9 (H) 79.5 -  101.0 fL   MCH 33.2 25.1 - 34.0 pg   MCHC 31.9 31.5 - 36.0 g/dL   RBC 3.21 (L) 3.70 - 5.45 10e6/uL   RDW 20.7 (H) 11.2 - 14.5 %   lymph# 1.6 0.9 - 3.3 10e3/uL   MONO# 0.3 0.1 - 0.9 10e3/uL   Eosinophils Absolute 0.1 0.0 - 0.5 10e3/uL   Basophils Absolute 0.0 0.0 - 0.1 10e3/uL   NEUT% 54.1 38.4 - 76.8 %   LYMPH% 36.1 14.0 - 49.7 %   MONO% 7.7 0.0 - 14.0 %   EOS% 2.0 0.0 - 7.0 %   BASO% 0.1 0.0 - 2.0 %  Comprehensive metabolic panel (Cmet) - [facility]  Result Value Ref Range   Sodium 142 136 - 145 mEq/L   Potassium 4.1 3.5 - 5.1 mEq/L   Chloride 104 98 - 109 mEq/L   CO2 29 22 - 29 mEq/L   Glucose 67 (L) 70 - 140 mg/dl   BUN 14.4 7.0 - 26.0 mg/dL   Creatinine 0.7 0.6 - 1.1 mg/dL   Total Bilirubin 0.21 0.20 - 1.20 mg/dL   Alkaline Phosphatase 152 (H) 40 - 150 U/L   AST 16 5 - 34 U/L   ALT 22 0 - 55 U/L   Total Protein 8.7 (H) 6.4 - 8.3 g/dL   Albumin 3.0 (L) 3.5 - 5.0 g/dL   Calcium 9.8 8.4 - 10.4 mg/dL   Anion Gap  9 3 - 11 mEq/L   EGFR >90 >90 ml/min/1.73 m2   SPEP pending due to elevated total protein and slightly low alb.  Studies/Results:  Will request CT CAP shortly prior to Dr Ammie Ferrier visit in late May, patient to take disc and report to the visit.  Medications: I have reviewed the patient's current medications. She will have carboplatin only as cycle 6 chemo on 10-21-14, and neulasta on 10-24-14.   DISCUSSION  Assessment/Plan:  1.IIIC1 high grade serous endometrial carcinoma: Post optimal debulking by Dr Jacquelyne Balint in [place] 04-06-14 and 3 cycles of adjuvant taxol carboplatin from 05-05-14 thru 07-29-13. Question of retroperitoneal and para-aortic adenopathy by CT 08-15-14. Cycle 6 will be [place] only due to taxol neuropathy. Scans after chemo completes and see gyn oncology then. 2.symptomatic anemia: improved with transfusion 10-07-14 3.taxol neuropathy increased in feet: hold cycle 6 taxol  4.blood in urine 08-15-14, 08-19-14 and moderated hemoglobin in ED specimen  09-27-14., culture negative, no obvious nephrolithiasis by CT. Urology appointment upcoming 5.baseline low BP per outside medical records.Patient is aware that she needs to push po fluids after chem 6.PAC in: should be used for scans and will need flush every 6-8 weeks if not used otherwise 7.long tobacco recently DCd. Stable pulmonary nodules on CT. 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 I will discuss with patient after this information is available 11.overdue mammograms, which we will address when possible 12. Restless legs with benadryl, dose decreased.  13. Mood better off Klonopin 14.elevated protein, low albumin: will check SPEP. Did not discuss now. 15.arthritis: symptoms much better back on naproxen   Chemo and nelasta orders adjusted and confirmed with pharmacist. All questions answered. Time spent 25 min including >50% counseling and coordination of care. Cc this note Drs Elliot Gault, Hedgecock   Gordy Levan, MD   10/17/2014, 2:36 PM

## 2014-10-17 NOTE — Telephone Encounter (Signed)
appoitments made and avs printed for pt

## 2014-10-19 LAB — PROTEIN ELECTROPHORESIS, SERUM, WITH REFLEX
Albumin ELP: 3.1 g/dL — ABNORMAL LOW (ref 3.8–4.8)
Alpha-1-Globulin: 0.5 g/dL — ABNORMAL HIGH (ref 0.2–0.3)
Alpha-2-Globulin: 0.9 g/dL (ref 0.5–0.9)
BETA 2: 0.7 g/dL — AB (ref 0.2–0.5)
Beta Globulin: 0.5 g/dL (ref 0.4–0.6)
GAMMA GLOBULIN: 2.3 g/dL — AB (ref 0.8–1.7)
Total Protein, Serum Electrophoresis: 8 g/dL (ref 6.1–8.1)

## 2014-10-19 LAB — IGG, IGA, IGM
IGG (IMMUNOGLOBIN G), SERUM: 2860 mg/dL — AB (ref 690–1700)
IgA: 782 mg/dL — ABNORMAL HIGH (ref 69–380)
IgM, Serum: 79 mg/dL (ref 52–322)

## 2014-10-19 LAB — IFE INTERPRETATION

## 2014-10-21 ENCOUNTER — Ambulatory Visit (HOSPITAL_BASED_OUTPATIENT_CLINIC_OR_DEPARTMENT_OTHER): Payer: Medicaid Other

## 2014-10-21 VITALS — BP 113/83 | HR 78 | Temp 98.0°F | Resp 18

## 2014-10-21 DIAGNOSIS — Z5111 Encounter for antineoplastic chemotherapy: Secondary | ICD-10-CM | POA: Diagnosis present

## 2014-10-21 DIAGNOSIS — C541 Malignant neoplasm of endometrium: Secondary | ICD-10-CM

## 2014-10-21 MED ORDER — HEPARIN SOD (PORK) LOCK FLUSH 100 UNIT/ML IV SOLN
500.0000 [IU] | Freq: Once | INTRAVENOUS | Status: AC | PRN
  Administered 2014-10-21: 500 [IU]
  Filled 2014-10-21: qty 5

## 2014-10-21 MED ORDER — SODIUM CHLORIDE 0.9 % IV SOLN
Freq: Once | INTRAVENOUS | Status: AC
  Administered 2014-10-21: 10:00:00 via INTRAVENOUS

## 2014-10-21 MED ORDER — SODIUM CHLORIDE 0.9 % IV SOLN
600.0000 mg | Freq: Once | INTRAVENOUS | Status: AC
  Administered 2014-10-21: 600 mg via INTRAVENOUS
  Filled 2014-10-21: qty 60

## 2014-10-21 MED ORDER — SODIUM CHLORIDE 0.9 % IJ SOLN
10.0000 mL | INTRAMUSCULAR | Status: DC | PRN
  Administered 2014-10-21: 10 mL
  Filled 2014-10-21: qty 10

## 2014-10-21 MED ORDER — LORAZEPAM 2 MG/ML IJ SOLN: 1.0000 mg | Freq: Once | INTRAMUSCULAR | Status: DC | PRN

## 2014-10-21 MED ORDER — DEXAMETHASONE SODIUM PHOSPHATE 100 MG/10ML IJ SOLN
Freq: Once | INTRAMUSCULAR | Status: AC
  Administered 2014-10-21: 10:00:00 via INTRAVENOUS
  Filled 2014-10-21: qty 4

## 2014-10-21 NOTE — Patient Instructions (Signed)
[facility] Discharge Instructions for Patients Receiving Chemotherapy  Today you received the following chemotherapy agents :  Carboplatin.  To help prevent nausea and vomiting after your treatment, we encourage you to take your nausea medication as prescribed.   If you develop nausea and vomiting that is not controlled by your nausea medication, call the clinic.   BELOW ARE SYMPTOMS THAT SHOULD BE REPORTED IMMEDIATELY:  *FEVER GREATER THAN 100.5 F  *CHILLS WITH OR WITHOUT FEVER  NAUSEA AND VOMITING THAT IS NOT CONTROLLED WITH YOUR NAUSEA MEDICATION  *UNUSUAL SHORTNESS OF BREATH  *UNUSUAL BRUISING OR BLEEDING  TENDERNESS IN MOUTH AND THROAT WITH OR WITHOUT PRESENCE OF ULCERS  *URINARY PROBLEMS  *BOWEL PROBLEMS  UNUSUAL RASH Items with * indicate a potential emergency and should be followed up as soon as possible.  Feel free to call the clinic you have any questions or concerns. The clinic phone number is [phone].  Please show the CHEMO ALERT CARD at check-in to the Emergency Department and triage nurse.   

## 2014-10-24 ENCOUNTER — Ambulatory Visit (HOSPITAL_BASED_OUTPATIENT_CLINIC_OR_DEPARTMENT_OTHER): Payer: Medicaid Other

## 2014-10-24 ENCOUNTER — Telehealth: Payer: Self-pay

## 2014-10-24 VITALS — BP 101/69 | HR 67 | Temp 97.5°F

## 2014-10-24 DIAGNOSIS — C541 Malignant neoplasm of endometrium: Secondary | ICD-10-CM

## 2014-10-24 DIAGNOSIS — Z5189 Encounter for other specified aftercare: Secondary | ICD-10-CM

## 2014-10-24 MED ORDER — PEGFILGRASTIM INJECTION 6 MG/0.6ML ~~LOC~~
6.0000 mg | PREFILLED_SYRINGE | Freq: Once | SUBCUTANEOUS | Status: AC
  Administered 2014-10-24: 6 mg via SUBCUTANEOUS
  Filled 2014-10-24: qty 0.6

## 2014-10-24 NOTE — Telephone Encounter (Signed)
-----   Message from Gordy Levan, MD sent at 10/17/2014  3:00 PM EDT ----- For CT CAP at [facility] on either 4-20 or 4-23. To see Dr Jacquelyne Balint in her [facility] office on May 25  Need to be sure report gets to Dr Sabra Heck Patient need to take CT disc to Dr Sabra Heck  I put in the CT order to give disc to patient, but I don't know if that will work.Marland KitchenMarland Kitchen

## 2014-10-24 NOTE — Telephone Encounter (Signed)
Told Sheena Caldwell that she has a CT C/A/P at [facility] on 11-04-14 at 1330.  She is NPO 4 hrs prior to scan which is 0930 on 11-04-14. Sheena Caldwell has the contrast to drink at home.  She is to drink one bottle at 1130 and the second one at 1230.  Patient wrote instructions down and repeated them correctly to this nurse. She needs to arrive at radiology at 1315 to register. Told her that she will need to pick up a CD of the scan to take to Dr. Sabra Heck. Spoke with Tedra Coupe in [place] file room.  She will have CD made an give to patient after scan.  Tedra Coupe will fax report to Dr. Sabra Heck when finalized.

## 2014-11-02 ENCOUNTER — Telehealth: Payer: Self-pay | Admitting: *Deleted

## 2014-11-02 NOTE — Telephone Encounter (Signed)
VM message @ 11:19 am from patient regarding CT scan instructions. She has CT scan scheduled on Friday, 11/04/14 @c1 :30 pm. She was asking when to drink the contrast solution-the night before or the morning of the scan. Informed her that she needs to drink the solution the morning of the scan as outlined by Dr. Mariana Kaufman nurse, Barbaraann Share. Pt voiced understanding. No further needs identified.

## 2014-11-04 ENCOUNTER — Ambulatory Visit (HOSPITAL_COMMUNITY)
Admission: RE | Admit: 2014-11-04 | Discharge: 2014-11-04 | Disposition: A | Discharge status: Home / Self Care | Payer: Medicaid Other | Source: Ambulatory Visit | Attending: Oncology | Admitting: Oncology

## 2014-11-04 ENCOUNTER — Encounter (HOSPITAL_COMMUNITY): Payer: Self-pay

## 2014-11-04 DIAGNOSIS — C541 Malignant neoplasm of endometrium: Secondary | ICD-10-CM | POA: Insufficient documentation

## 2014-11-04 DIAGNOSIS — Z9221 Personal history of antineoplastic chemotherapy: Secondary | ICD-10-CM | POA: Diagnosis not present

## 2014-11-04 MED ORDER — IOHEXOL 300 MG/ML  SOLN
100.0000 mL | Freq: Once | INTRAMUSCULAR | Status: AC | PRN
  Administered 2014-11-04: 100 mL via INTRAVENOUS

## 2014-11-08 NOTE — Telephone Encounter (Signed)
CT Scan report from  C/A/P from 11-04-14 faxed on 11-04-14 at 1707 to Dr. Jacquelyne Balint by [employer] LPN. for appointment on 11-09-14.

## 2014-11-23 ENCOUNTER — Other Ambulatory Visit: Payer: Self-pay | Admitting: Oncology

## 2014-11-23 ENCOUNTER — Telehealth: Payer: Self-pay | Admitting: *Deleted

## 2014-11-23 DIAGNOSIS — C541 Malignant neoplasm of endometrium: Secondary | ICD-10-CM

## 2014-11-23 NOTE — Telephone Encounter (Signed)
Referral to RT put into EMR now.  Please request note from Dr Othella Boyer recent visit, I believe at her [facility] office. I did speak with Dr Sabra Heck; tho she and I  did not discuss RT, this seems very reasonable to me.  thanks

## 2014-11-23 NOTE — Telephone Encounter (Signed)
LM in patient's vm in mobile phone stating that Dr. Marko Plume made a referral to [facility] Radiation Oncology Dept. Today. She should heare from there schedulers in the next few days. Reminded her of Dr. Mariana Kaufman follow up appointment on 12-01-14 at 1230 for lab and 1300 for visit. Received office notes for 11-09-14 and 11-22-14 visits with Dr. Alycia Rossetti.  Notes given to Dr. Marko Plume to review. Dr. Ammie Ferrier office phone is [phone]  Fax: [phone]. Office is in [place], [place].

## 2014-11-23 NOTE — Telephone Encounter (Signed)
Patient called stating that she would like to know the status of her referral for radiation. There are currently no orders in and patient states that MD Marko Plume has spoken to her MD in [place] about this referral. Messages sent to MD and RN.

## 2014-11-24 ENCOUNTER — Encounter: Payer: Self-pay | Admitting: Oncology

## 2014-11-24 NOTE — Progress Notes (Signed)
Medical Oncology  Recent clinic notes requested and received from Dr Jacquelyne Balint, and will be scanned into this EMR.  Patient seen 11-09-2014, CT reviewed, with pulmonary lesions stable, overall stable. Dr Sabra Heck notes that patient is at very high risk for recurrent disease and will need to be followed closely. Suggested brachytherapy to decrease risk of cuff recurrence.  Patient seen for follow up discussion 11-22-14 "she may benefit from XRT at least to the vaginal cuff or IMRT to the pelvis in light of the large cervical tumor volume" which patient prefers done in [place]. "She knows that she remains at very high risk for further dissemination and that the pulmonary lesions need to be followed closely"  Referral to radiation oncology pending.  Godfrey Pick, MD

## 2014-11-27 ENCOUNTER — Other Ambulatory Visit: Payer: Self-pay | Admitting: Oncology

## 2014-11-27 DIAGNOSIS — C541 Malignant neoplasm of endometrium: Secondary | ICD-10-CM

## 2014-12-01 ENCOUNTER — Other Ambulatory Visit (HOSPITAL_BASED_OUTPATIENT_CLINIC_OR_DEPARTMENT_OTHER): Payer: Medicaid Other

## 2014-12-01 ENCOUNTER — Ambulatory Visit (HOSPITAL_BASED_OUTPATIENT_CLINIC_OR_DEPARTMENT_OTHER): Payer: Medicaid Other | Admitting: Oncology

## 2014-12-01 ENCOUNTER — Telehealth: Payer: Self-pay | Admitting: Oncology

## 2014-12-01 ENCOUNTER — Encounter: Payer: Self-pay | Admitting: Oncology

## 2014-12-01 VITALS — BP 112/83 | HR 79 | Temp 97.6°F | Resp 18 | Ht 66.5 in | Wt 143.4 lb

## 2014-12-01 DIAGNOSIS — G2581 Restless legs syndrome: Secondary | ICD-10-CM

## 2014-12-01 DIAGNOSIS — Z720 Tobacco use: Secondary | ICD-10-CM

## 2014-12-01 DIAGNOSIS — I712 Thoracic aortic aneurysm, without rupture: Secondary | ICD-10-CM | POA: Diagnosis not present

## 2014-12-01 DIAGNOSIS — C541 Malignant neoplasm of endometrium: Secondary | ICD-10-CM

## 2014-12-01 DIAGNOSIS — R312 Other microscopic hematuria: Secondary | ICD-10-CM

## 2014-12-01 DIAGNOSIS — G620 Drug-induced polyneuropathy: Secondary | ICD-10-CM | POA: Diagnosis not present

## 2014-12-01 DIAGNOSIS — Z95828 Presence of other vascular implants and grafts: Secondary | ICD-10-CM

## 2014-12-01 DIAGNOSIS — D6481 Anemia due to antineoplastic chemotherapy: Secondary | ICD-10-CM | POA: Diagnosis not present

## 2014-12-01 DIAGNOSIS — Z87891 Personal history of nicotine dependence: Secondary | ICD-10-CM

## 2014-12-01 DIAGNOSIS — I700 Atherosclerosis of aorta: Secondary | ICD-10-CM

## 2014-12-01 DIAGNOSIS — R918 Other nonspecific abnormal finding of lung field: Secondary | ICD-10-CM

## 2014-12-01 DIAGNOSIS — R031 Nonspecific low blood-pressure reading: Secondary | ICD-10-CM | POA: Diagnosis not present

## 2014-12-01 DIAGNOSIS — F1099 Alcohol use, unspecified with unspecified alcohol-induced disorder: Secondary | ICD-10-CM

## 2014-12-01 DIAGNOSIS — M1990 Unspecified osteoarthritis, unspecified site: Secondary | ICD-10-CM

## 2014-12-01 DIAGNOSIS — F1290 Cannabis use, unspecified, uncomplicated: Secondary | ICD-10-CM

## 2014-12-01 DIAGNOSIS — T451X5A Adverse effect of antineoplastic and immunosuppressive drugs, initial encounter: Secondary | ICD-10-CM

## 2014-12-01 LAB — CBC WITH DIFFERENTIAL/PLATELET
BASO%: 1.2 % (ref 0.0–2.0)
BASOS ABS: 0 10*3/uL (ref 0.0–0.1)
EOS ABS: 0.1 10*3/uL (ref 0.0–0.5)
EOS%: 3.4 % (ref 0.0–7.0)
HCT: 37.8 % (ref 34.8–46.6)
HGB: 12.4 g/dL (ref 11.6–15.9)
LYMPH%: 42 % (ref 14.0–49.7)
MCH: 35.5 pg — AB (ref 25.1–34.0)
MCHC: 32.9 g/dL (ref 31.5–36.0)
MCV: 108 fL — AB (ref 79.5–101.0)
MONO#: 0.3 10*3/uL (ref 0.1–0.9)
MONO%: 8.9 % (ref 0.0–14.0)
NEUT#: 1.5 10*3/uL (ref 1.5–6.5)
NEUT%: 44.5 % (ref 38.4–76.8)
PLATELETS: 253 10*3/uL (ref 145–400)
RBC: 3.5 10*6/uL — AB (ref 3.70–5.45)
RDW: 19.5 % — AB (ref 11.2–14.5)
WBC: 3.4 10*3/uL — ABNORMAL LOW (ref 3.9–10.3)
lymph#: 1.4 10*3/uL (ref 0.9–3.3)

## 2014-12-01 LAB — COMPREHENSIVE METABOLIC PANEL (CC13)
ALBUMIN: 3.6 g/dL (ref 3.5–5.0)
ALT: 16 U/L (ref 0–55)
ANION GAP: 4 meq/L (ref 3–11)
AST: 17 U/L (ref 5–34)
Alkaline Phosphatase: 119 U/L (ref 40–150)
BUN: 9.1 mg/dL (ref 7.0–26.0)
CALCIUM: 9.5 mg/dL (ref 8.4–10.4)
CHLORIDE: 106 meq/L (ref 98–109)
CO2: 33 mEq/L — ABNORMAL HIGH (ref 22–29)
Creatinine: 0.8 mg/dL (ref 0.6–1.1)
GLUCOSE: 103 mg/dL (ref 70–140)
POTASSIUM: 3.9 meq/L (ref 3.5–5.1)
SODIUM: 142 meq/L (ref 136–145)
TOTAL PROTEIN: 8 g/dL (ref 6.4–8.3)
Total Bilirubin: 0.4 mg/dL (ref 0.20–1.20)

## 2014-12-01 MED ORDER — SODIUM CHLORIDE 0.9 % IJ SOLN
10.0000 mL | INTRAMUSCULAR | Status: DC | PRN
  Administered 2014-12-01: 10 mL via INTRAVENOUS
  Filled 2014-12-01: qty 10

## 2014-12-01 MED ORDER — HEPARIN SOD (PORK) LOCK FLUSH 100 UNIT/ML IV SOLN
500.0000 [IU] | Freq: Once | INTRAVENOUS | Status: AC
  Administered 2014-12-01: 500 [IU] via INTRAVENOUS
  Filled 2014-12-01: qty 5

## 2014-12-01 NOTE — Progress Notes (Signed)
OFFICE PROGRESS NOTE   December 01, 2014   Browns Carver Fila Hedgecock(PCP, [facility] [employer]), _ [name] [facility]); Louis Meckel New patient consultation Dr [name] scheduled 12-21-14  Case discussed with Dr Jacquelyne Balint by phone and her notes 5-25 and 11-22-14 reviewed, these to be scanned into EMR. Urology note from 10-20-14 also reviewed and will be scanned into EMR.  INTERVAL HISTORY:  Patient is seen, alone for visit, in follow up of recently completed chemotherapy given adjuvantly for IIIC high grade serous endometrial carcinoma. Cycle 6 on 10-21-14 was carboplatin only due to significant peripheral neuropathy, with neulasta on 10-24-14.  Patient had restaging CT CAP on 11-04-14 had no apparent residual or recurrent gyn cancer. She saw Dr Sabra Heck, with recommendation for vaginal brachytherapy vs IMRT to pelvis, which patient prefers to have done in [place]. She is to see Dr Sondra Come upcoming. Dr Sabra Heck has been clear that patient is at high risk for disseminated disease.  She is to see Dr Sabra Heck again in August.  Patient was evaluated including cystoscopy by Dr Louis Meckel at [facility] on 10-20-14 due to previous microhematuria. There were no findings of concern on that exam.  Patient has felt progressively better overall since completion of chemotherapy. 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 Bowels are moving well and diet review seems adequate tho she complains of decreased appetite. She denies nausea or vomiting. She has seen no bleeding including no hematuria. Energy is improved. She denies increased SOB. No problems with PAC, which she agrees to keep for now.     PAC in, flushed today No genetics testing  ONCOLOGIC HISTORY Patient had one episode of post menopausal vaginal bleeding early 2015. Later in 2015 she had persistent vaginal discharge, for which she was seen by PCP Suzann Hedgecock  in ~ 02-2014, had CTs in [facility] and was referred to Dr Adella Nissen, gyn in [facility]. Endometrial biopsy had concern (that path not included in information sent for consultation) such that she was referred to Dr Jacquelyne Balint. Surgery by Dr Sabra Heck at [facility] in [place] on 04-06-14 was TAH, BSO, omentectomy, pelvic and right common iliac node evaluation; patient was hospitalized x 3 days and tells me that she recovered well from the surgery. Pathology [phone]) from 04-06-14 found high grade serous carcinoma involving entire thickness of myometrium (depth of invasion 1.1 cm out of 1.1 cm), with invasion of cervical stroma, no involvement of uterine serosa/bilateral tubes and ovaries/omentum, positive LVSI, 2/5 right pelvic nodes, 2 of 4 left pelvic nodes and 0/1 right common iliac node. Bulk of the carcinoma was in lower uterine segment where it was within 0.1 cm of serosal surface. Cytology positive for adenocarcinoma on washings ([id number]). Surgical findings were significant for no paraaortic adenopathy apparent. Patient has received 3 cycles of adjuvant chemotherapy in [facility] by Dr Sabra Heck, on 05-05-14, 05-26-14 and 07-29-14. Per records and patient, delay of cycle 3 was related to low counts and insurance changes. She received neulasta after chemotherapy on 07-29-14. Taxol was given at 175 mg/m2 for total dose was 280 mg cycle 1 and 291 mg for cycles 2 and 3; carboplatin was AUC = 6 with total dose 610 mg cycle 1, 620 mg cycle 2 and 630 mg cycle 3.. She does not recall using oral decadron premed for taxol, with premeds listed on chemo flowsheets standard decadron 20 mg, zofran 16 mg, benadryl 50 mg (which caused severe restless legs) and pepcid  20 mg. Outside information does not include serial blood counts. Last note from Dr Sabra Heck dated 07-29-14 describes unremarkable exam, "NED during chemotherapy and adequate PS". Patient was see by Dr Denman George 08-09-14, who recommends restaging scans after 6 cycles of  chemotherapy and consideration of vaginal brachytherapy; she has not been seen by radiation oncology in [place]. CT AP done in [facility] 08-15-14 (due to elevated LFTs and blood in urine just after transfer of care) with soft tissue fullness at superior abdominal aorta and question of retroperitoneal necrotic adenopathy; plan repeat scans after complete present chemotherapy. Cycle 4 carbo taxol given 08-24-14 at [facility], with neulasta.Cycle 6 was delayed with symptomatic anemia and other issues, given 10-21-14 as carboplatin only due to peripheral neuropathy in feet.      Review of systems as above, also: She plans [facility], possibly with [employer] program thru [facility]. She does not sleep well "a glass of wine helps". No problems with PAC. Remainder of 10 point Review of Systems negative.  Objective:  Vital signs in last 24 hours:  BP 112/83 mmHg  Pulse 79  Temp(Src) 97.6 F (36.4 C) (Oral)  Resp 18  Ht 5' 6.5" (1.689 m)  Wt 143 lb 6.4 oz (65.046 kg)  BMI 22.80 kg/m2 Weight up 2 lbs Alert, oriented and appropriate. Ambulatory without difficulty.   HEENT:PERRL, sclerae not icteric. Oral mucosa moist without lesions, posterior pharynx clear.  Neck supple. No JVD.  Lymphatics:no cervical,supraclavicular, axillary or inguinal adenopathy Resp: somewhat diminished BS thruout otherwise clear to auscultation bilaterally and normal percussion bilaterally Cardio: regular rate and rhythm. No gallop. GI: soft, nontender, not distended, no mass or organomegaly. Normally active bowel sounds. Surgical incision not remarkable. Musculoskeletal/ Extremities: without pitting edema, cords, tenderness Neuro: no increase in peripheral neuropathy. Otherwise nonfocal. Psych appropriate mood and affect Skin without rash, ecchymosis, petechiae Portacath-without erythema or tenderness  Lab Results:  Results for orders placed or performed in visit on 12/01/14  CBC with Differential   Result Value Ref Range   WBC 3.4 (L) 3.9 - 10.3 10e3/uL   NEUT# 1.5 1.5 - 6.5 10e3/uL   HGB 12.4 11.6 - 15.9 g/dL   HCT 37.8 34.8 - 46.6 %   Platelets 253 145 - 400 10e3/uL   MCV 108.0 (H) 79.5 - 101.0 fL   MCH 35.5 (H) 25.1 - 34.0 pg   MCHC 32.9 31.5 - 36.0 g/dL   RBC 3.50 (L) 3.70 - 5.45 10e6/uL   RDW 19.5 (H) 11.2 - 14.5 %   lymph# 1.4 0.9 - 3.3 10e3/uL   MONO# 0.3 0.1 - 0.9 10e3/uL   Eosinophils Absolute 0.1 0.0 - 0.5 10e3/uL   Basophils Absolute 0.0 0.0 - 0.1 10e3/uL   NEUT% 44.5 38.4 - 76.8 %   LYMPH% 42.0 14.0 - 49.7 %   MONO% 8.9 0.0 - 14.0 %   EOS% 3.4 0.0 - 7.0 %   BASO% 1.2 0.0 - 2.0 %  Comprehensive metabolic panel (Cmet) - [facility]  Result Value Ref Range   Sodium 142 136 - 145 mEq/L   Potassium 3.9 3.5 - 5.1 mEq/L   Chloride 106 98 - 109 mEq/L   CO2 33 (H) 22 - 29 mEq/L   Glucose 103 70 - 140 mg/dl   BUN 9.1 7.0 - 26.0 mg/dL   Creatinine 0.8 0.6 - 1.1 mg/dL   Total Bilirubin 0.40 0.20 - 1.20 mg/dL   Alkaline Phosphatase 119 40 - 150 U/L   AST 17 5 -  34 U/L   ALT 16 0 - 55 U/L   Total Protein 8.0 6.4 - 8.3 g/dL   Albumin 3.6 3.5 - 5.0 g/dL   Calcium 9.5 8.4 - 10.4 mg/dL   Anion Gap 4 3 - 11 mEq/L   EGFR >90 >90 ml/min/1.73 m2     Studies/Results: CT CHEST, ABDOMEN, AND PELVIS WITH CONTRAST  TECHNIQUE: Multidetector CT imaging of the chest, abdomen and pelvis was performed following the standard protocol during bolus administration of intravenous contrast.  CONTRAST: 132mL OMNIPAQUE IOHEXOL 300 MG/ML SOLN  COMPARISON: 08/15/2014 ; 03/25/2014  FINDINGS: CT CHEST FINDINGS  Mediastinum/Nodes: Small bilateral axillary and subpectoral lymph nodes. Anterior thoracic aortic arch 4.2 cm, ascending aorta 4.1 cm.  Left anterior descending coronary artery atherosclerotic calcification. Right-sided lymph node above the right inferior pulmonary vein on image 36 series 2 measures 0.9 cm in short axis, not previously present.  Lungs/Pleura: Severe  emphysema. Biapical pleural parenchymal scarring. Posterior basal segment right lower lobe nodule 0.4 cm, previously 0.2 cm, measured on image 48 series 4. New 3 mm pulmonary nodule in the right middle lobe, image 36 series 4. Stable 4 mm right lower lobe nodule, image 39 series 4. Stable 4 mm lingular nodule, image 40 series 4. Stable 6 mm lingular nodule, image 43 series 4.  Prior pleural effusions have resolved. Prior pericardial effusion has resolved.  Musculoskeletal: Unremarkable  CT ABDOMEN PELVIS FINDINGS  Hepatobiliary: Unremarkable  Pancreas: Unremarkable  Spleen: Unremarkable  Adrenals/Urinary Tract: 1.5 cm simple cyst in the right mid kidney anteriorly. Several hypodense lesions in both kidneys are likely to be cysts although technically nonspecific due to small size.  Stomach/Bowel: Prominent stool throughout the colon favors constipation.  Vascular/Lymphatic: Aortoiliac atherosclerotic vascular disease. Prior periaortic lucency at the hiatus is no longer present.  Reproductive: Uterus absent. Ovaries not seen.  Other: No supplemental non-categorized findings.  Musculoskeletal: Degenerative arthropathy of both hips, left greater than right. Grade 1 degenerative anterolisthesis at L3-4 with degenerative disc disease at L3-4, L4-5, and L5-S1.  Small ventral supraumbilical hernia contains adipose tissue.  IMPRESSION: 1. Prior periaortic lucency of concern at the hiatus has resolved and was probably transient and inflammatory or due to third spacing of fluid. The pericardial effusion and pleural effusions have also resolved. 2. Upper normal sized lymph node just above the right inferior pulmonary vein is new compared to the prior exam and may merit observation. 3. Tiny scattered pulmonary nodules remain stable. 4. Prominent stool throughout the colon favors constipation. 5. Aortoiliac atherosclerotic vascular disease. 6. Degenerative  arthropathy of the hips and degenerative disc disease in the lumbar spine. 7. Small ventral supraumbilical hernia contains adipose tissue. 8. Emphysema. 9. Left anterior descending coronary artery atherosclerosis. 10. Ascending thoracic aortic aneurysm. Recommend annual imaging followup by CTA or MRA. This recommendation follows 2010 ACCF/AHA/AATS/ACR/ASA/SCA/SCAI/SIR/STS/SVM Guidelines for the Diagnosis and Management of Patients with Thoracic Aortic Disease. Circulation. 2010; 121: B638-G536   Medications: I have reviewed the patient's current medications.  DISCUSSION: patient is pleased with improvements since off of chemotherapy and is in agreement with consultation with radiation oncology. She understands appropriate schedule for PAC flushes.  Assessment/Plan:  1.IIIC1 high grade serous endometrial carcinoma: Post optimal debulking by Dr Jacquelyne Balint 04-06-14 and 3 cycles of adjuvant taxol carboplatin from 05-05-14 thru 07-29-13. Question of retroperitoneal and para-aortic adenopathy by CT 08-15-14. Last 3 cycles of chemotherapy given in [place] from 08-24-14 thru 10-21-14, last with [place] only due to taxol neuropathy. No known active disease now, tho  she is at high risk. Radiation therapy consultation requested. Follow up with Dr Sabra Heck in August and I will see her coordinating with PAC flush in Oct. 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 Encouraged lots of exercise and follow 4.microhematuria: urology evaluation including cystoscopy not remarkable 5.baseline low BP per outside medical records. 6.PAC in: needs flush every 6-8 weeks if not used otherwise 7.long tobacco recently DCd. Stable pulmonary nodules on CT. 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 Restless legs with benadryl, 13. Mood better off Klonopin 14.degenerative arthritis  changes hips and L spine by CT, symtoms better with naproxen 15. Ascending thoracic aortic aneurysm reported on CT chest 11-04-14. This information to be sent to patient's PCP. I was not aware of these findings until after visit and did not discuss with her today. 16. Aortoiliac and coronary artery atherosclerosis per CT  All questions answered, time spent 25 min including >50% counseling and coordination of care. She knows to call if needed prior to next scheduled appointment. Cc Drs Sabra Heck, Kinard, Hedgecock   Gordy Levan, MD   12/01/2014, 5:34 PM

## 2014-12-01 NOTE — Telephone Encounter (Signed)
Left message to confirm appointment for August & October. Mailed calendar.

## 2014-12-15 ENCOUNTER — Encounter: Payer: Self-pay | Admitting: Radiation Oncology

## 2014-12-15 NOTE — Progress Notes (Addendum)
GYN Location of Tumor / Histology: IIIC high grade serous endometrial carcinoma   Sheena Caldwell presented with one episode of post menopausal vaginal bleeding early 2015. Later in 2015 she had persistent vaginal discharge.  Biopsies revealed:   04/06/14    Past/Anticipated interventions by Gyn/Onc surgery, if any: 04/06/14 - TAH, BSO, omentectomy, pelvic and right common iliac node evaluation by Dr. Sabra Heck in [place], [place]  Past/Anticipated interventions by medical oncology, if any:3 cycles of adjuvant taxol carboplatin from 05-05-14 thru 07-29-13. Question of retroperitoneal and para-aortic adenopathy by CT 08-15-14. Last 3 cycles of chemotherapy given in [place] from 08-24-14 thru 10-21-14, last with [place] only due to taxol neuropathy.   Weight changes, if any:  Gained  20 lbs> after surgery Bowel/Bladder complaints, if any: no  Bladder or bowel difficulties normal, no bleeding no discharge  Nausea/Vomiting, if any: no  Pain issues, if any:  Left lower side tenderness stated SAFETY ISSUES: NO  Prior radiation? NO  Pacemaker/ICD?   Possible current pregnancy? no  Is the patient on methotrexate?  No  Current Complaints / other details:  Single, 2 children, father throat cancer deceased, mother living,    BP 109/79 mmHg  Pulse 78  Temp(Src) 97.5 F (36.4 C) (Oral)  Resp 20  Ht 5' 6.5" (1.689 m)  Wt 146 lb 1.6 oz (66.271 kg)  BMI 23.23 kg/m2  SpO2 99%  Wt Readings from Last 3 Encounters:  12/21/14 146 lb 1.6 oz (66.271 kg)  12/01/14 143 lb 6.4 oz (65.046 kg)  10/17/14 141 lb (63.957 kg)

## 2014-12-20 ENCOUNTER — Telehealth: Payer: Self-pay | Admitting: *Deleted

## 2014-12-20 NOTE — Telephone Encounter (Signed)
VM message from patient received @ 12:43 pm  TC to patient. She states she got a call from scheduler for port flush for August. Pt is under the impression that she can have her port removed since she is done with her chemo therapy.  She forgot to talk to Dr. Marko Plume about this at her last appt.  She would like to have her port removed.

## 2014-12-20 NOTE — Telephone Encounter (Signed)
TC to patient and informed her that she will need to keep her port for awhile longer. She voiced understanding. She is seein [employer] laboratory technician for 1st time.  appts are set for port flush for at least August and September.

## 2014-12-20 NOTE — Telephone Encounter (Signed)
Re triage note patient would like PAC removed:  Please tell her that it would be best to keep the [facility] in for now. We hope that we are finished with chemo, but it would be better to keep the [facility] for at least several more months in case she needs more treatment. The PAC needs to be flushed every 6-8 weeks.  Please find out when she is to see Dr Jacquelyne Balint next, as I may need to see her sooner than we have scheduled. I believe RT still to be added also.  thanks

## 2014-12-21 ENCOUNTER — Ambulatory Visit
Admission: RE | Admit: 2014-12-21 | Discharge: 2014-12-21 | Disposition: A | Discharge status: Home / Self Care | Payer: Medicaid Other | Source: Ambulatory Visit | Attending: Radiation Oncology | Admitting: Radiation Oncology

## 2014-12-21 VITALS — BP 109/79 | HR 78 | Temp 97.5°F | Resp 20 | Ht 66.5 in | Wt 146.1 lb

## 2014-12-21 DIAGNOSIS — C541 Malignant neoplasm of endometrium: Secondary | ICD-10-CM

## 2014-12-21 NOTE — Telephone Encounter (Signed)
Spoke with Dr. Alejandro Mulling office and her next follow up appointment is 01-24-15 at 1:20 pm. Follow up with Dr. Marko Plume is 03-23-15 at 1030 with lab and PAC flush.

## 2014-12-21 NOTE — Progress Notes (Signed)
Please see the Nurse Progress Note in the MD Initial Consult Encounter for this patient. 

## 2014-12-21 NOTE — Progress Notes (Signed)
Radiation Oncology         [phone]) [phone] ________________________________  Initial Outpatient Consultation  Name: Sheena Caldwell MRN: [id number]  Date: 12/21/2014  DOB: 27-Nov-1950  JQ:[id number], PA-C  Marko Plume, Tamala Julian, MD   REFERRING PHYSICIAN: Gordy Levan, MD  DIAGNOSIS: Stage IIIc1 endometrial serous carcinoma.  HISTORY OF PRESENT ILLNESS::Sheena Caldwell is a 64 y.o. female who is seen out of the courtesy of Dr. Marko Plume for an opinion concerning radiation therapy as part of management of the patient's advanced endometrial serous carcinoma. On 04/06/2014 she underwent a total abdominal hysterectomy BSO omentectomy lymph node dissection by Dr. Jacquelyne Balint in [place]. Final pathology revealed a uterine serous carcinoma which was 9 cm in greatest dimension, with Full myometrial invasion, positive L VSI, positive peritoneal cytology, positive pelvic lymph nodes (2 of 5 on the right, and 2 of 5 on the left). The omentum was negative for metastatic carcinoma. Periaortic lymph nodes were not sampled.  Following surgery was recommended that she receive 6 cycles of adjuvant chemotherapy with carboplatin AUC 6 and paclitaxel on paclitaxel 175 mg/m. (Dr. Serita Grit intake note) She relocated to [facility] in the midst of her adjuvant chemotherapy. Patient recently completed her adjuvant chemotherapy under the direction of Dr. Marko Plume and is now referred to radiation oncology for consideration for postoperative treatments.    PREVIOUS RADIATION THERAPY: No  PAST MEDICAL HISTORY:  has a past medical history of Uterine cancer.    PAST SURGICAL HISTORY: Past Surgical History  Procedure Laterality Date  . Cesarean section    . Incise and drain abcess  1970    scalp  . Portacath placement  04/26/2014    rt. power port with tip in SVC  . Total abdominal hysterectomy w/ bilateral salpingoophorectomy  04/06/14    TAH, BSO, omentectomy, pelvic and right common iliac node evaluation     FAMILY HISTORY: family history includes Cancer in her father; Hypertension in her mother.  SOCIAL HISTORY:  reports that she quit smoking about 17 months ago. Her smoking use included Cigarettes. She has a 30 pack-year smoking history. She has never used smokeless tobacco. She reports that she drinks about 1.2 - 1.8 oz of alcohol per week. She reports that she uses illicit drugs (Marijuana).  ALLERGIES: Levaquin  MEDICATIONS:  Current Outpatient Prescriptions  Medication Sig Dispense Refill  . clonazePAM (KLONOPIN) 1 MG tablet Take 0.5 mg by mouth daily as needed for anxiety (for restless legs).     . docusate sodium (COLACE) 100 MG capsule Take 100-200 mg by mouth daily as needed for mild constipation or moderate constipation.     . ferrous sulfate 325 (65 FE) MG tablet Take 325 mg by mouth daily.    Marland Kitchen HYDROcodone-acetaminophen (NORCO/VICODIN) 5-325 MG per tablet Take 1 tablet by mouth every 4 (four) hours as needed for moderate pain.    . hydrocortisone cream 1 % Apply 1 application topically 2 (two) times daily as needed for itching.    . loratadine (CLARITIN) 10 MG tablet Take 10 mg by mouth daily as needed for allergies.     . miconazole (MONISTAT 1 COMBINATION PACK) kit Place 1 each vaginally 2 (two) times daily as needed (Pt. applies to external vaginal area).    . Multiple Vitamin (MULTI-VITAMINS) TABS Take 1 tablet by mouth daily.    . naproxen sodium (ANAPROX) 220 MG tablet Take 220 mg by mouth 2 (two) times daily with a meal. Pt takes med as needed    . ondansetron (ZOFRAN) 8  MG tablet Take 8 mg by mouth every 8 (eight) hours as needed for nausea or vomiting.    . polyethylene glycol (MIRALAX / GLYCOLAX) packet Take 17 g by mouth daily as needed.    . promethazine (PHENERGAN) 12.5 MG tablet Take 12.5 mg by mouth every 6 (six) hours as needed for nausea or vomiting.     No current facility-administered medications for this encounter.    REVIEW OF SYSTEMS:  A 15 point review  of systems is documented in the electronic medical record. This was obtained by the nursing staff. However, I reviewed this with the patient to discuss relevant findings and make appropriate changes.  Continued fatigue from her chemotherapy. Patient does have problems with peripheral neuropathy but this seems to be slowly improving. She denies any low back pain and pelvic pain cough or breathing problems. Patient denies any new headaches dizziness or blurred vision.  She denies any vaginal bleeding or discharge.  She presented last year with vaginal discharge prompting her workup   PHYSICAL EXAM:  height is 5' 6.5" (1.689 m) and weight is 146 lb 1.6 oz (66.271 kg). Her oral temperature is 97.5 F (36.4 C). Her blood pressure is 109/79 and her pulse is 78. Her respiration is 20 and oxygen saturation is 99%.   BP 109/79 mmHg  Pulse 78  Temp(Src) 97.5 F (36.4 C) (Oral)  Resp 20  Ht 5' 6.5" (1.689 m)  Wt 146 lb 1.6 oz (66.271 kg)  BMI 23.23 kg/m2  SpO2 99%  General Appearance:    Alert, cooperative, no distress, appears stated age  Head:    Normocephalic, without obvious abnormality, atraumatic  Eyes:    PERRL, conjunctiva/corneas clear, EOM's intact,      Ears:    Normal TM's and external ear canals, both ears  Nose:   Nares normal, septum midline, mucosa normal, no drainage    or sinus tenderness  Throat:   Lips, mucosa, and tongue normal; dentures along the maxilla, 2-3 teeth remaining along the anterior mandible, gums normal  Neck:   Supple, symmetrical, trachea midline, no adenopathy;    thyroid:  no enlargement/tenderness/nodules; no carotid   bruit or JVD  Back:     Symmetric, no curvature, ROM normal, no CVA tenderness  Lungs:     Clear to auscultation bilaterally, respirations unlabored  Chest Wall:    No tenderness or deformity, Port-A-Cath in place    Heart:    Regular rate and rhythm, S1 and S2 normal, no murmur, rub   or gallop  Breast Exam:   not performed  Abdomen:     Soft,  non-tender, bowel sounds active all four quadrants,    no masses, no organomegaly, well-healed vertical midline scar   Genitalia:    normal external genitalia. On speculum examination no mucosal lesions are noted in the vaginal vault. On bimanual examination no pelvic masses appreciated. Vaginal cuff was noted to be intact.   Rectal:    deferred  Extremities:   Extremities normal, atraumatic, no cyanosis or edema  Pulses:   2+ and symmetric all extremities  Skin:   Skin color, texture, turgor normal, no rashes or lesions  Lymph nodes:   Cervical, supraclavicular, and axillary nodes normal  Neurologic:   normal strength, sensation and reflexes    throughout     ECOG = 0  0 - Asymptomatic (Fully active, able to carry on all predisease activities without restriction)   LABORATORY DATA:  Lab Results  Component Value Date  WBC 3.4* 12/01/2014   HGB 12.4 12/01/2014   HCT 37.8 12/01/2014   MCV 108.0* 12/01/2014   PLT 253 12/01/2014   NEUTROABS 1.5 12/01/2014   Lab Results  Component Value Date   NA 142 12/01/2014   K 3.9 12/01/2014   CL 103 09/29/2014   CO2 33* 12/01/2014   GLUCOSE 103 12/01/2014   CREATININE 0.8 12/01/2014   CALCIUM 9.5 12/01/2014      RADIOGRAPHY: No results found.    IMPRESSION: Stage IIIc1 endometrial serous carcinoma. Given the findings of serous histology and the presence of lymphovascular space invasion, the patient will be at risk for vaginal vault recurrence and would agree with recommendations for vaginal brachytherapy. I discussed the course of treatment side effects and potential toxicities of high-dose rate vaginal vault brachii therapy with the patient. She appears to understand and wishes to proceed with planned course of treatment.  PLAN: Patient will be set up planning and her first treatment next week. She will receive 5 intracavitary treatments on a bi- weekly or weekly basis.     ------------------------------------------------  Blair Promise, PhD, MD

## 2014-12-23 ENCOUNTER — Telehealth: Payer: Self-pay | Admitting: *Deleted

## 2014-12-23 NOTE — Telephone Encounter (Signed)
Called patient to inform of New HDR  Case , spoke with patient and she is aware of these appts.

## 2014-12-25 ENCOUNTER — Emergency Department (HOSPITAL_COMMUNITY)
Admission: EM | Admit: 2014-12-25 | Discharge: 2014-12-25 | Disposition: A | Discharge status: Home / Self Care | Payer: Medicaid Other | Attending: Emergency Medicine | Admitting: Emergency Medicine

## 2014-12-25 ENCOUNTER — Emergency Department (HOSPITAL_COMMUNITY): Payer: Medicaid Other

## 2014-12-25 ENCOUNTER — Encounter (HOSPITAL_COMMUNITY): Payer: Self-pay | Admitting: *Deleted

## 2014-12-25 DIAGNOSIS — Z8541 Personal history of malignant neoplasm of cervix uteri: Secondary | ICD-10-CM | POA: Diagnosis not present

## 2014-12-25 DIAGNOSIS — Z9221 Personal history of antineoplastic chemotherapy: Secondary | ICD-10-CM | POA: Diagnosis not present

## 2014-12-25 DIAGNOSIS — Z79891 Long term (current) use of opiate analgesic: Secondary | ICD-10-CM | POA: Diagnosis not present

## 2014-12-25 DIAGNOSIS — Z79899 Other long term (current) drug therapy: Secondary | ICD-10-CM | POA: Diagnosis not present

## 2014-12-25 DIAGNOSIS — K5901 Slow transit constipation: Secondary | ICD-10-CM | POA: Diagnosis not present

## 2014-12-25 DIAGNOSIS — Z9889 Other specified postprocedural states: Secondary | ICD-10-CM | POA: Insufficient documentation

## 2014-12-25 DIAGNOSIS — R109 Unspecified abdominal pain: Secondary | ICD-10-CM

## 2014-12-25 DIAGNOSIS — Z90710 Acquired absence of both cervix and uterus: Secondary | ICD-10-CM | POA: Diagnosis not present

## 2014-12-25 DIAGNOSIS — Z7952 Long term (current) use of systemic steroids: Secondary | ICD-10-CM | POA: Diagnosis not present

## 2014-12-25 DIAGNOSIS — Z87891 Personal history of nicotine dependence: Secondary | ICD-10-CM | POA: Insufficient documentation

## 2014-12-25 LAB — URINALYSIS, ROUTINE W REFLEX MICROSCOPIC
Bilirubin Urine: NEGATIVE
Glucose, UA: NEGATIVE mg/dL
Ketones, ur: NEGATIVE mg/dL
Leukocytes, UA: NEGATIVE
Nitrite: NEGATIVE
Protein, ur: NEGATIVE mg/dL
Specific Gravity, Urine: 1.018 (ref 1.005–1.030)
Urobilinogen, UA: 0.2 mg/dL (ref 0.0–1.0)
pH: 7.5 (ref 5.0–8.0)

## 2014-12-25 LAB — COMPREHENSIVE METABOLIC PANEL
ALBUMIN: 3.8 g/dL (ref 3.5–5.0)
ALT: 13 U/L — ABNORMAL LOW (ref 14–54)
AST: 22 U/L (ref 15–41)
Alkaline Phosphatase: 101 U/L (ref 38–126)
Anion gap: 9 (ref 5–15)
BUN: 10 mg/dL (ref 6–20)
CO2: 25 mmol/L (ref 22–32)
Calcium: 9.4 mg/dL (ref 8.9–10.3)
Chloride: 104 mmol/L (ref 101–111)
Creatinine, Ser: 0.78 mg/dL (ref 0.44–1.00)
GFR calc Af Amer: >60 mL/min (ref >60)
GFR calc non Af Amer: >60 mL/min (ref >60)
GLUCOSE: 129 mg/dL — AB (ref 65–99)
Potassium: 4 mmol/L (ref 3.5–5.1)
Sodium: 138 mmol/L (ref 135–145)
TOTAL PROTEIN: 8.1 g/dL (ref 6.5–8.1)
Total Bilirubin: 0.5 mg/dL (ref 0.3–1.2)

## 2014-12-25 LAB — CBC WITH DIFFERENTIAL/PLATELET
BASOS ABS: 0 10*3/uL (ref 0.0–0.1)
Basophils Relative: 1 % (ref 0–1)
Eosinophils Absolute: 0.2 10*3/uL (ref 0.0–0.7)
Eosinophils Relative: 4 % (ref 0–5)
HCT: 40.8 % (ref 36.0–46.0)
HEMOGLOBIN: 13.7 g/dL (ref 12.0–15.0)
Lymphocytes Relative: 38 % (ref 12–46)
Lymphs Abs: 1.4 10*3/uL (ref 0.7–4.0)
MCH: 36.1 pg — AB (ref 26.0–34.0)
MCHC: 33.6 g/dL (ref 30.0–36.0)
MCV: 107.4 fL — AB (ref 78.0–100.0)
Monocytes Absolute: 0.3 10*3/uL (ref 0.1–1.0)
Monocytes Relative: 7 % (ref 3–12)
NEUTROS PCT: 50 % (ref 43–77)
Neutro Abs: 1.9 10*3/uL (ref 1.7–7.7)
Platelets: 204 10*3/uL (ref 150–400)
RBC: 3.8 MIL/uL — ABNORMAL LOW (ref 3.87–5.11)
RDW: 15 % (ref 11.5–15.5)
WBC: 3.8 10*3/uL — ABNORMAL LOW (ref 4.0–10.5)

## 2014-12-25 LAB — URINE MICROSCOPIC-ADD ON

## 2014-12-25 LAB — LIPASE, BLOOD: LIPASE: 12 U/L — AB (ref 22–51)

## 2014-12-25 MED ORDER — IOHEXOL 300 MG/ML  SOLN
100.0000 mL | Freq: Once | INTRAMUSCULAR | Status: AC | PRN
  Administered 2014-12-25: 100 mL via INTRAVENOUS

## 2014-12-25 MED ORDER — POLYETHYLENE GLYCOL 3350 17 GM/SCOOP PO POWD: 17.0000 g | Freq: Two times a day (BID) | ORAL | Status: DC

## 2014-12-25 MED ORDER — SODIUM CHLORIDE 0.9 % IV SOLN
Freq: Once | INTRAVENOUS | Status: AC
  Administered 2014-12-25: 12:00:00 via INTRAVENOUS

## 2014-12-25 MED ORDER — DOCUSATE SODIUM 100 MG PO CAPS: 100.0000 mg | ORAL_CAPSULE | Freq: Two times a day (BID) | ORAL | Status: DC

## 2014-12-25 MED ORDER — FENTANYL CITRATE (PF) 100 MCG/2ML IJ SOLN
50.0000 ug | Freq: Once | INTRAMUSCULAR | Status: AC
  Administered 2014-12-25: 50 ug via INTRAVENOUS
  Filled 2014-12-25: qty 2

## 2014-12-25 MED ORDER — IOHEXOL 300 MG/ML  SOLN
50.0000 mL | Freq: Once | INTRAMUSCULAR | Status: AC | PRN
  Administered 2014-12-25: 50 mL via ORAL

## 2014-12-25 MED ORDER — MAGNESIUM CITRATE PO SOLN: 296.0000 mL | Freq: Once | ORAL | Status: DC

## 2014-12-25 NOTE — Discharge Instructions (Signed)
Please call your doctor for a followup appointment within 24-48 hours. When you talk to your doctor please let them know that you were seen in the emergency department and have them acquire all of your records so that they can discuss the findings with you and formulate a treatment plan to fully care for your new and ongoing problems. ° °

## 2014-12-25 NOTE — ED Notes (Signed)
MD at bedside  Pt alert and oriented x4. Respirations even and unlabored, bilateral symmetrical rise and fall of chest. Skin warm and dry. In no acute distress. Denies needs.

## 2014-12-25 NOTE — ED Provider Notes (Signed)
CSN: [id number]     Arrival date & time 12/25/14  1052 History   First MD Initiated Contact with Patient 12/25/14 1100     Chief Complaint  Patient presents with  . ca pt, flank/ back pain      (Consider location/radiation/quality/duration/timing/severity/associated sxs/prior Treatment) HPI Comments: The patient is a 64 year old female, she has a history of uterine cancer, she finished chemotherapy approximately 2 months ago and is getting ready to start radiation. She had a hysterectomy prior to her start of treatment. She reports approximately 2 weeks of left flank pain. It is constant, gradually worsening, initially improved with anti-inflammatory spent now is taking hydrocodone to get relief. She states that it is a "nagging pain", no radiation, does not seem to be positional, noted associated dysuria nausea vomiting or fevers. She has never had this pain before.  The history is provided by the patient.    Past Medical History  Diagnosis Date  . Uterine cancer     finished chemo May 2016, to start radiation July 2016   Past Surgical History  Procedure Laterality Date  . Cesarean section    . Incise and drain abcess  1970    scalp  . Portacath placement  04/26/2014    rt. power port with tip in SVC  . Total abdominal hysterectomy w/ bilateral salpingoophorectomy  04/06/14    TAH, BSO, omentectomy, pelvic and right common iliac node evaluation   Family History  Problem Relation Age of Onset  . Hypertension Mother   . Cancer Father    History  Substance Use Topics  . Smoking status: Former Smoker -- 1.00 packs/day for 30 years    Types: Cigarettes    Quit date: 07/16/2013  . Smokeless tobacco: Never Used  . Alcohol Use: 1.2 - 1.8 oz/week    2-3 Glasses of wine per week     Comment: occasional wine   OB History    Gravida Para Term Preterm AB TAB SAB Ectopic Multiple Living   3 2 2  0 1 0 0 0 0 2     Review of Systems  All other systems reviewed and are  negative.     Allergies  Levaquin  Home Medications   Prior to Admission medications   Medication Sig Start Date End Date Taking? Authorizing Provider  ferrous sulfate 325 (65 FE) MG tablet Take 325 mg by mouth daily.   Yes Historical Provider, MD  HYDROcodone-acetaminophen (NORCO/VICODIN) 5-325 MG per tablet Take 1 tablet by mouth every 4 (four) hours as needed for moderate pain.   Yes Historical Provider, MD  hydrocortisone cream 1 % Apply 1 application topically 2 (two) times daily as needed for itching.   Yes Historical Provider, MD  loratadine (CLARITIN) 10 MG tablet Take 10 mg by mouth daily as needed for allergies.    Yes Historical Provider, MD  Multiple Vitamin (MULTI-VITAMINS) TABS Take 1 tablet by mouth daily.   Yes Historical Provider, MD  naproxen sodium (ANAPROX) 220 MG tablet Take 220 mg by mouth 2 (two) times daily as needed (pain).    Yes Historical Provider, MD  ondansetron (ZOFRAN) 8 MG tablet Take 8 mg by mouth every 8 (eight) hours as needed for nausea or vomiting.   Yes Historical Provider, MD  promethazine (PHENERGAN) 12.5 MG tablet Take 12.5 mg by mouth every 6 (six) hours as needed for nausea or vomiting.   Yes Historical Provider, MD  docusate sodium (COLACE) 100 MG capsule Take 1 capsule (100 mg total)  by mouth every 12 (twelve) hours. 12/25/14   Noemi Chapel, MD  magnesium citrate solution Take 296 mLs by mouth once. OTC 12/25/14   Noemi Chapel, MD  polyethylene glycol powder (GLYCOLAX/MIRALAX) powder Take 17 g by mouth 2 (two) times daily. Until daily soft stools  OTC 12/25/14   Noemi Chapel, MD   BP 126/78 mmHg  Pulse 56  Temp(Src) 98 F (36.7 C) (Oral)  Resp 16  Wt 146 lb (66.225 kg)  SpO2 99% Physical Exam  Constitutional: She appears well-developed and well-nourished. No distress.  HENT:  Head: Normocephalic and atraumatic.  Mouth/Throat: Oropharynx is clear and moist. No oropharyngeal exudate.  Eyes: Conjunctivae and EOM are normal. Pupils are  equal, round, and reactive to light. Right eye exhibits no discharge. Left eye exhibits no discharge. No scleral icterus.  Neck: Normal range of motion. Neck supple. No JVD present. No thyromegaly present.  Cardiovascular: Normal rate, regular rhythm, normal heart sounds and intact distal pulses.  Exam reveals no gallop and no friction rub.   No murmur heard. Pulmonary/Chest: Effort normal and breath sounds normal. No respiratory distress. She has no wheezes. She has no rales.  Abdominal: Soft. Bowel sounds are normal. She exhibits no distension and no mass. There is no tenderness.  Soft nontender abdomen, lower abdominal surgical scars well-healed, no masses, no bleeding, no changes in the skin. Left flank with tenderness to palpation which is mild, no masses or asymmetry  Musculoskeletal: Normal range of motion. She exhibits no edema or tenderness.  Lymphadenopathy:    She has no cervical adenopathy.  Neurological: She is alert. Coordination normal.  Skin: Skin is warm and dry. No rash noted. No erythema.  Psychiatric: She has a normal mood and affect. Her behavior is normal.  Nursing note and vitals reviewed.   ED Course  Procedures (including critical care time) Labs Review Labs Reviewed  URINALYSIS, ROUTINE W REFLEX MICROSCOPIC (NOT AT [facility]) - Abnormal; Notable for the following:    Hgb urine dipstick SMALL (*)    All other components within normal limits  CBC WITH DIFFERENTIAL/PLATELET - Abnormal; Notable for the following:    WBC 3.8 (*)    RBC 3.80 (*)    MCV 107.4 (*)    MCH 36.1 (*)    All other components within normal limits  COMPREHENSIVE METABOLIC PANEL - Abnormal; Notable for the following:    Glucose, Bld 129 (*)    ALT 13 (*)    All other components within normal limits  LIPASE, BLOOD - Abnormal; Notable for the following:    Lipase 12 (*)    All other components within normal limits  URINE MICROSCOPIC-ADD ON - Abnormal; Notable for the following:    Squamous  Epithelial / LPF FEW (*)    All other components within normal limits  URINE CULTURE    Imaging Review Ct Abdomen Pelvis W Contrast  12/25/2014   CLINICAL DATA:  Left flank and back pain for 2 weeks. Endometrial carcinoma, recently completed chemotherapy.  EXAM: CT ABDOMEN AND PELVIS WITH CONTRAST  TECHNIQUE: Multidetector CT imaging of the abdomen and pelvis was performed using the standard protocol following bolus administration of intravenous contrast.  CONTRAST:  176mL OMNIPAQUE IOHEXOL 300 MG/ML  SOLN  COMPARISON:  11/04/14  FINDINGS: Lower Chest: No acute findings.  Hepatobiliary: No masses or other significant abnormality identified.  Pancreas: No mass, inflammatory changes, or other significant abnormality identified.  Spleen:  Within normal limits in size and appearance.  Adrenals:  No  masses identified.  Kidneys/Urinary Tract: No evidence of masses or hydronephrosis. Tiny bilateral renal cysts again noted.  Stomach/Bowel/Peritoneum: No evidence of wall thickening, mass, or obstruction. Large colonic stool burden again noted.  Vascular/Lymphatic: No pathologically enlarged lymph nodes identified. No abdominal aortic aneurysm or other significant retroperitoneal abnormality demonstrated.  Reproductive: Prior hysterectomy noted. Adnexal regions are unremarkable in appearance.  Other:  Tiny paraumbilical ventral hernia remains stable.  Musculoskeletal:  No suspicious bone lesions identified.  IMPRESSION: No acute findings within the abdomen or pelvis. No evidence of recurrent or metastatic carcinoma.  Large stool burden again demonstrated; suggest clinical correlation for possible constipation.   Electronically Signed   By: Earle Gell M.D.   On: 12/25/2014 13:03      MDM   Final diagnoses:  Flank pain  Slow transit constipation    Vital signs are unremarkable, exam with some tenderness in the left flank, differential diagnosis would include kidney stone, pyelonephritis, metastatic disease,  intra-abdominal or retroperitoneal pathology, CT scan ordered. The patient is in agreement with the plan.  Labs unremarkable, CT scan shows heavy stool burden, no other acute findings. The patient was made aware of all of her results, she will be prescribed a bowel regimen, stable for discharge. She is in agreement with the plan.  Meds given in ED:  Medications  0.9 %  sodium chloride infusion ( Intravenous New Bag/Given 12/25/14 1138)  fentaNYL (SUBLIMAZE) injection 50 mcg (50 mcg Intravenous Given 12/25/14 1139)  iohexol (OMNIPAQUE) 300 MG/ML solution 50 mL (50 mLs Oral Contrast Given 12/25/14 1132)  iohexol (OMNIPAQUE) 300 MG/ML solution 100 mL (100 mLs Intravenous Contrast Given 12/25/14 1228)    New Prescriptions   DOCUSATE SODIUM (COLACE) 100 MG CAPSULE    Take 1 capsule (100 mg total) by mouth every 12 (twelve) hours.   MAGNESIUM CITRATE SOLUTION    Take 296 mLs by mouth once. OTC   POLYETHYLENE GLYCOL POWDER (GLYCOLAX/MIRALAX) POWDER    Take 17 g by mouth 2 (two) times daily. Until daily soft stools  OTC      Noemi Chapel, MD 12/25/14 [phone]

## 2014-12-25 NOTE — ED Notes (Signed)
Bed: WA26 Expected date:  Expected time:  Means of arrival:  Comments: 

## 2014-12-25 NOTE — ED Notes (Signed)
Pt currently has uterine cancer, finished chemo May 2016, pt is getting ready to start radiation. Pt reports intermittent left flank/back pain x2 weeks. Pain 6/10. Denies dysuria. Pt reports she went swimming two weeks ago and then the next day the pain started. Pt has not alerted cancer doctor of pain.

## 2014-12-27 LAB — URINE CULTURE

## 2015-01-02 ENCOUNTER — Telehealth: Payer: Self-pay | Admitting: *Deleted

## 2015-01-02 NOTE — Telephone Encounter (Signed)
CALLED PATIENT TO REMIND OF HDR CASE FOR 01-03-15, SPOKE WITH PATIENT AND SHE IS AWARE OF THESE APPTS.

## 2015-01-03 ENCOUNTER — Ambulatory Visit
Admission: RE | Admit: 2015-01-03 | Discharge: 2015-01-03 | Disposition: A | Discharge status: Home / Self Care | Payer: Medicaid Other | Source: Ambulatory Visit | Attending: Radiation Oncology | Admitting: Radiation Oncology

## 2015-01-03 ENCOUNTER — Encounter: Payer: Self-pay | Admitting: Radiation Oncology

## 2015-01-03 VITALS — BP 116/76 | HR 67 | Temp 97.8°F | Resp 20 | Ht 66.5 in | Wt 146.1 lb

## 2015-01-03 DIAGNOSIS — C541 Malignant neoplasm of endometrium: Secondary | ICD-10-CM | POA: Diagnosis not present

## 2015-01-03 NOTE — Progress Notes (Signed)
  Radiation Oncology         [phone] ________________________________  Name: Sheena Caldwell MRN: [id number]  Date: 01/03/2015  DOB: January 14, 1951   HDR BRACHYTHERAPY  DIAGNOSIS:     ICD-9-CM ICD-10-CM   1. Endometrial cancer 182.0 C54.1      NARRATIVE:  After planning was complete the patient was transferred to the high-dose-rate suite. She was placed in the dorsolithotomy position. The patient's custom vaginal cylinder was placed in the proximal vagina. This is a fixed to the CT/MR stabilization plate to prevent slippage. The patient tolerated the placement well.  Verification simulation:   patient had an AP and lateral film obtained documenting accurate position of the vaginal cylinder for treatments.  High-dose-rate brachytherapy treatment:   patient proceeded to undergo her first high-dose-rate treatment directed at the proximal vagina. The patient was prescribed a dose of 6 gray to the proximal vaginal mucosal surface.  this was achieved with 1 channel using multiple dwell positions ( 9).  Total treatment time was 207.6 seconds The patient tolerated the procedure well. After completion of her therapy a radiation survey was performed documenting return of the iridium source into the gamma med safe.     ________________________________  Blair Promise, PhD, MD

## 2015-01-03 NOTE — Progress Notes (Signed)
Sheena Caldwell here for follow up.  She reports her left knee is bothering her today and is rating her pain at a 2/10 today. She denies having bladder issues.  She reports occasional constipation.  Her last bm was yesterday.  She denies having any vaginal/rectal bleeding.  She reports having a good appetite and denies having any nausea.  BP 116/76 mmHg  Pulse 67  Temp(Src) 97.8 F (36.6 C) (Oral)  Resp 20  Ht 5' 6.5" (1.689 m)  Wt 146 lb 1.6 oz (66.271 kg)  BMI 23.23 kg/m2  SpO2 99%

## 2015-01-03 NOTE — Progress Notes (Signed)
Please see the Nurse Progress Note in the MD Initial Consult Encounter for this patient. 

## 2015-01-03 NOTE — Progress Notes (Signed)
  Radiation Oncology         [phone] ________________________________  Name: Sheena Caldwell MRN: [id number]  Date: 01/03/2015  DOB: [birth year]  SIMULATION AND TREATMENT PLANNING NOTE HDR BRACHYTHERAPY  DIAGNOSIS:  Stage IIIc1 endometrial serous carcinoma.  NARRATIVE:  The patient was brought to the [place].  Identity was confirmed.  All relevant records and images related to the planned course of therapy were reviewed.  The patient freely provided informed written consent to proceed with treatment after reviewing the details related to the planned course of therapy. The consent form was witnessed and verified by the simulation staff.  Then, the patient was set-up in a stable reproducible  supine position for radiation therapy.  the patient's custom vaginal cylinder was placed in the proximal vagina. This was affixed to the CT/MR stabilization plate to prevent slippage. CT images were obtained.  Surface markings were placed.  The CT images were loaded into the planning software.  Then the target and avoidance structures were contoured.  Treatment planning then occurred.  The radiation prescription was entered and confirmed.   I have requested : Brachytherapy Isodose Plan and Dosimetry Calculations to plan the radiation distribution.    PLAN:  The patient will receive 30 Gy in 5 fractions directed at the proximal vagina. Treatment length will be 4 cm. Prescription will be 6 gray to the vaginal mucosal surface. The patient will be treated with iridium 192 as the high-dose-rate source. The patient will be treated with a 3 cm segmented cylinder.    ________________________________  Blair Promise, PhD, MD

## 2015-01-03 NOTE — Progress Notes (Signed)
Radiation Oncology         (336) [phone] ________________________________  Name: Sheena Caldwell MRN: [id number]  Date: 01/03/2015  DOB: 04-23-1951  Vaginal brachii therapy procedure  Note  CC: HEDGECOCK,SUZANNE, PA-C  Ardith Dark, PA-C    ICD-9-CM ICD-10-CM   1. Endometrial cancer 182.0 C54.1     Diagnosis:   Stage IIIc1 endometrial serous carcinoma.    Narrative:  The patient returns today for planning of her high-dose rate radiation treatment and her first treatment later today. Since her initial consultation July 6 patient presented to the emergency room for left flank/ pelvic pain. She underwent a CT scan showing no evidence of recurrence. The patient was felt to have constipation explaining her symptoms with a large stool burden noted on CT scans. Patient denies any further pain. She denies any vaginal bleeding or urinary symptoms since her initial evaluation July 6.  The patient proceeded to undergo fitting for her vaginal cylinder. On exam the vaginal cuff remained intact with no mucosal lesions noted. The optimal diameter to distend the vaginal vault without undue discomfort was a 3 cm diameter cylinder. Patient tolerated the procedure well with minimal discomfort with the cylinder insertion.   Simple treatment device note  Patient had construction of her custom vaginal cylinder. She will be treated with a 3 cm segmented cylinder.                             Physical Findings: The patient is in no acute distress. Patient is alert and oriented.  height is 5' 6.5" (1.689 m) and weight is 146 lb 1.6 oz (66.271 kg). Her oral temperature is 97.8 F (36.6 C). Her blood pressure is 116/76 and her pulse is 67. Her respiration is 20 and oxygen saturation is 99%. .  No significant changes.  Lab Findings: Lab Results  Component Value Date   WBC 3.8* 12/25/2014   HGB 13.7 12/25/2014   HCT 40.8 12/25/2014   MCV 107.4* 12/25/2014   PLT 204 12/25/2014    Radiographic  Findings: Ct Abdomen Pelvis W Contrast  12/25/2014   CLINICAL DATA:  Left flank and back pain for 2 weeks. Endometrial carcinoma, recently completed chemotherapy.  EXAM: CT ABDOMEN AND PELVIS WITH CONTRAST  TECHNIQUE: Multidetector CT imaging of the abdomen and pelvis was performed using the standard protocol following bolus administration of intravenous contrast.  CONTRAST:  157mL OMNIPAQUE IOHEXOL 300 MG/ML  SOLN  COMPARISON:  11/04/14  FINDINGS: Lower Chest: No acute findings.  Hepatobiliary: No masses or other significant abnormality identified.  Pancreas: No mass, inflammatory changes, or other significant abnormality identified.  Spleen:  Within normal limits in size and appearance.  Adrenals:  No masses identified.  Kidneys/Urinary Tract: No evidence of masses or hydronephrosis. Tiny bilateral renal cysts again noted.  Stomach/Bowel/Peritoneum: No evidence of wall thickening, mass, or obstruction. Large colonic stool burden again noted.  Vascular/Lymphatic: No pathologically enlarged lymph nodes identified. No abdominal aortic aneurysm or other significant retroperitoneal abnormality demonstrated.  Reproductive: Prior hysterectomy noted. Adnexal regions are unremarkable in appearance.  Other:  Tiny paraumbilical ventral hernia remains stable.  Musculoskeletal:  No suspicious bone lesions identified.  IMPRESSION: No acute findings within the abdomen or pelvis. No evidence of recurrent or metastatic carcinoma.  Large stool burden again demonstrated; suggest clinical correlation for possible constipation.   Electronically Signed   By: Earle Gell M.D.   On: 12/25/2014 13:03    Impression:  Successful fitting  for her vaginal brachii therapy treatments.  Plan:  Patient will proceed to CT simulation for planning of her therapy.  ____________________________________ [name], MD

## 2015-01-04 ENCOUNTER — Encounter: Payer: Self-pay | Admitting: Radiation Oncology

## 2015-01-05 ENCOUNTER — Encounter: Payer: Self-pay | Admitting: Radiation Oncology

## 2015-01-06 ENCOUNTER — Encounter: Payer: Self-pay | Admitting: Radiation Oncology

## 2015-01-09 ENCOUNTER — Encounter: Payer: Self-pay | Admitting: Radiation Oncology

## 2015-01-10 ENCOUNTER — Telehealth: Payer: Self-pay | Admitting: *Deleted

## 2015-01-10 NOTE — Telephone Encounter (Signed)
CALLED PATIENT TO REMIND OF HDR [place]. FOR 01-11-15 @ 9 AM, LVM FOR A RETURN CALL

## 2015-01-11 ENCOUNTER — Ambulatory Visit
Admission: RE | Admit: 2015-01-11 | Discharge: 2015-01-11 | Disposition: A | Discharge status: Home / Self Care | Payer: Medicaid Other | Source: Ambulatory Visit | Attending: Radiation Oncology | Admitting: Radiation Oncology

## 2015-01-11 DIAGNOSIS — C541 Malignant neoplasm of endometrium: Secondary | ICD-10-CM | POA: Diagnosis not present

## 2015-01-11 NOTE — Progress Notes (Signed)
  Radiation Oncology         (336) [phone] ________________________________  Name: Sheena Caldwell MRN: [id number]  Date: 01/11/2015  DOB: [birth year]   HDR BRACHYTHERAPY  DIAGNOSIS: Sheena Caldwell is a 64 year old female presenting with Stage IIIc1 endometrial serous carcinoma.  NARRATIVE:   Simple treatment device note  The patient had construction of her custom vaginal cylinder for treatment. She will be treated with a 3 cm diameter cylinder. The patient will be treated with a segmented cylinder  Vaginal brachytherapy procedure:   the patient was transferred to the high-dose-rate suite. She was placed in the dorsolithotomy position. The patient's custom vaginal cylinder was placed in the proximal vagina. This is a fixed to the CT/MR stabilization plate to prevent slippage. The patient tolerated the placement well.    She reported no burning with urination, but is experiencing itching in the vaginal area as a new symptom.  Verification simulation: The patient had an AP and lateral film obtained documenting accurate position of the vaginal cylinder for treatments.  High-dose-rate brachytherapy treatment: The patient proceeded to undergo her second high-dose-rate treatment directed at the proximal vagina. The patient was prescribed a dose of 6 gray to the proximal vaginal mucosal surface.  this was achieved with 1 channel using multiple dwell positions (9).  Total treatment time was 223.7 seconds The patient tolerated the procedure well. After completion of her therapy a radiation survey was performed documenting return of the iridium source into the gamma med safe.    This document serves as a record of services personally performed by [name], MD. It was created on his behalf by Lenn Cal, a trained medical scribe. The creation of this record is based on the scribe's personal observations and the provider's statements to them. This document has been checked and approved by the  attending provider.    ______________________________  Blair Promise, PhD, MD

## 2015-01-12 ENCOUNTER — Telehealth: Payer: Self-pay | Admitting: *Deleted

## 2015-01-12 NOTE — Telephone Encounter (Signed)
CALLED PATIENT TO REMIND OF HDR [place] 01-13-15 @ 11 AM , SPOKE WITH PATIENT AND SHE IS AWARE OF THIS [place].

## 2015-01-13 ENCOUNTER — Ambulatory Visit
Admission: RE | Admit: 2015-01-13 | Discharge: 2015-01-13 | Disposition: A | Discharge status: Home / Self Care | Payer: Medicaid Other | Source: Ambulatory Visit | Attending: Radiation Oncology | Admitting: Radiation Oncology

## 2015-01-13 DIAGNOSIS — C541 Malignant neoplasm of endometrium: Secondary | ICD-10-CM

## 2015-01-13 NOTE — Progress Notes (Signed)
Expand All Collapse All    Radiation Oncology [phone] ________________________________  Name: Sheena Caldwell: [id number] Date: 7/29/2016DOB: 07-10-1950   HDR BRACHYTHERAPY  DIAGNOSIS: [name] is a 64 year old female presenting with Stage IIIc1 endometrial serous carcinoma.  NARRATIVE:   Simple treatment device note  The patient had construction of her custom vaginal cylinder for treatment. She will be treated with a 3 cm diameter cylinder. The patient will be treated with a segmented cylinder  Vaginal brachytherapy procedure:  the patient was transferred to the high-dose-rate suite. She was placed in the dorsolithotomy position. The patient's custom vaginal cylinder was placed in the proximal vagina. This is a fixed to the CT/MR stabilization plate to prevent slippage. The patient tolerated the placement well.   She reported no burning with urination, but is experiencing fatigue.  Verification simulation: The patient had an AP and lateral film obtained documenting accurate position of the vaginal cylinder for treatments.  High-dose-rate brachytherapy treatment: The patient proceeded to undergo her third high-dose-rate treatment directed at the proximal vagina. The patient was prescribed a dose of 6 gray to the proximal vaginal mucosal surface. this was achieved with 1 channel using multiple dwell positions (9). Total treatment time was 228.2 seconds The patient tolerated the procedure well. After completion of her therapy a radiation survey was performed documenting return of the iridium source into the gamma med safe.    This document serves as a record of services personally performed by [name], MD. It was created on his behalf by Lenn Cal, a trained medical scribe. The creation of this record is based on the scribe's personal observations and the provider's statements to them. This document has been  checked and approved by the attending provider.   ______________________________  Blair Promise, PhD,

## 2015-01-23 ENCOUNTER — Telehealth: Payer: Self-pay | Admitting: *Deleted

## 2015-01-23 NOTE — Telephone Encounter (Signed)
CALLED PATIENT TO REMIND OF HDR TX. FOR 01-24-15 @ 9 AM, SPOKE WITH PATIENT AND SHE IS AWARE OF THIS Sheena Caldwell.

## 2015-01-24 ENCOUNTER — Ambulatory Visit
Admission: RE | Admit: 2015-01-24 | Discharge: 2015-01-24 | Disposition: A | Discharge status: Home / Self Care | Payer: Medicaid Other | Source: Ambulatory Visit | Attending: Radiation Oncology | Admitting: Radiation Oncology

## 2015-01-24 DIAGNOSIS — C541 Malignant neoplasm of endometrium: Secondary | ICD-10-CM

## 2015-01-24 NOTE — Progress Notes (Signed)
  Name: Sheena Caldwell: [id number] Date: 8/9/2016DOB: 11-28-1950   HDR BRACHYTHERAPY  DIAGNOSIS: Sheena Caldwell is a 64 year old female presenting with Stage IIIc1 endometrial serous carcinoma.  NARRATIVE:   Simple treatment device note  The patient had construction of her custom vaginal cylinder for treatment. She will be treated with a 3 cm diameter cylinder. The patient will be treated with a segmented cylinder  Vaginal brachytherapy procedure:  the patient was transferred to the high-dose-rate suite. She was placed in the dorsolithotomy position. The patient's custom vaginal cylinder was placed in the proximal vagina. This is a fixed to the CT/MR stabilization plate to prevent slippage. The patient tolerated the placement well.   She has noticed some itching in the in the perineum. Possible yeast infection in this area. The patient will be given   anti-fungal powder.  Verification simulation: The patient had an AP and lateral film obtained documenting accurate position of the vaginal cylinder for treatments.  High-dose-rate brachytherapy treatment: The patient proceeded to undergo her fourth high-dose-rate treatment directed at the proximal vagina. The patient was prescribed a dose of 6 gray to the proximal vaginal mucosal surface. this was achieved with 1 channel using multiple dwell positions (9). Total treatment time was 252.9 seconds The patient tolerated the procedure well. After completion of her therapy a radiation survey was performed documenting return of the iridium source into the gamma med safe.    This document serves as a record of services personally performed by [name], MD. It was created on his behalf by Lenn Cal, a trained medical scribe. The creation of this record is based on the scribe's personal observations and the provider's statements to them. This document has been checked and approved by the attending  provider.   ______________________________

## 2015-01-26 ENCOUNTER — Ambulatory Visit
Admission: RE | Admit: 2015-01-26 | Discharge: 2015-01-26 | Disposition: A | Discharge status: Home / Self Care | Payer: Medicaid Other | Source: Ambulatory Visit | Attending: Radiation Oncology | Admitting: Radiation Oncology

## 2015-01-26 ENCOUNTER — Ambulatory Visit (HOSPITAL_BASED_OUTPATIENT_CLINIC_OR_DEPARTMENT_OTHER): Payer: Medicaid Other

## 2015-01-26 VITALS — BP 106/81 | HR 69 | Temp 98.6°F | Resp 18

## 2015-01-26 DIAGNOSIS — Z452 Encounter for adjustment and management of vascular access device: Secondary | ICD-10-CM

## 2015-01-26 DIAGNOSIS — Z95828 Presence of other vascular implants and grafts: Secondary | ICD-10-CM

## 2015-01-26 DIAGNOSIS — C541 Malignant neoplasm of endometrium: Secondary | ICD-10-CM

## 2015-01-26 MED ORDER — SODIUM CHLORIDE 0.9 % IJ SOLN
10.0000 mL | INTRAMUSCULAR | Status: DC | PRN
  Administered 2015-01-26: 10 mL via INTRAVENOUS
  Filled 2015-01-26: qty 10

## 2015-01-26 MED ORDER — HEPARIN SOD (PORK) LOCK FLUSH 100 UNIT/ML IV SOLN
500.0000 [IU] | Freq: Once | INTRAVENOUS | Status: AC
  Administered 2015-01-26: 500 [IU] via INTRAVENOUS
  Filled 2015-01-26: qty 5

## 2015-01-26 NOTE — Progress Notes (Signed)
  Name: Sheena Caldwell: [id number] Date: 8/11/2016DOB: [birth year]   HDR BRACHYTHERAPY  DIAGNOSIS: Sheena Caldwell is a 64 year old female presenting with Stage IIIc1 endometrial serous carcinoma.  NARRATIVE:   Simple treatment device note  The patient had construction of her custom vaginal cylinder for treatment. She will be treated with a 3 cm diameter cylinder. The patient will be treated with a segmented cylinder  Vaginal brachytherapy procedure:  the patient was transferred to the high-dose-rate suite. She was placed in the dorsolithotomy position. The patient's custom vaginal cylinder was placed in the proximal vagina. This is a fixed to the CT/MR stabilization plate to prevent slippage. The patient tolerated the placement well.   She has noticed some itching in the in the perineum. Possible yeast infection in this area. The patient was given   anti-fungal powder. This has been helpful for her itching.  Verification simulation: The patient had an AP and lateral film obtained documenting accurate position of the vaginal cylinder for treatments.  High-dose-rate brachytherapy treatment: The patient proceeded to undergo her fifth high-dose-rate treatment directed at the proximal vagina. The patient was prescribed a dose of 6 gray to the proximal vaginal mucosal surface. this was achieved with 1 channel using multiple dwell positions (9). Total treatment time was 257.6 seconds The patient tolerated the procedure well. After completion of her therapy a radiation survey was performed documenting return of the iridium source into the gamma med safe.   This document serves as a record of services personally performed by [name], MD. It was created on his behalf by Arlyce Harman, a trained medical scribe. The creation of this record is based on the scribe's personal observations and the provider's statements to them. This document has been  checked and approved by the attending provider. ______________________________

## 2015-01-26 NOTE — Patient Instructions (Signed)

## 2015-02-02 ENCOUNTER — Encounter: Payer: Self-pay | Admitting: Radiation Oncology

## 2015-02-02 NOTE — Progress Notes (Signed)
  Radiation Oncology         [phone]) [phone] ________________________________  Name: Sheena Caldwell MRN: [id number]  Date: 02/02/2015  DOB: [birth year]  End of Treatment Note    Diagnosis:   Stage IIIc1 endometrial serous carcinoma.  Indication for treatment:  Postop, risk for vaginal cuff recurrence       Radiation treatment dates:   July 19, July 27, July 29, August 9, August 11  Site/dose:   Proximal vagina, 30 gray in 5 fractions  Beams/energy:   The patient was treated with iridium 192 as the high-dose-rate source. She was treated with a 3 cm diameter cylinder, treatment length 4 cm, prescription to the mucosal surface  Narrative: The patient tolerated radiation treatment relatively well.   The patient experienced some itching in the perineum. This is treated with any fungal cream with good success. No significant urinary symptoms or vaginal discomfort during the course of treatment.  Plan: The patient has completed radiation treatment. The patient will return to radiation oncology clinic for routine followup in one month. I advised them to call or return sooner if they have any questions or concerns related to their recovery or treatment.  -----------------------------------  Blair Promise, PhD, MD

## 2015-02-22 ENCOUNTER — Telehealth: Payer: Self-pay

## 2015-02-22 NOTE — Telephone Encounter (Signed)
Faxed signed form dated 02-21-15 to Lauren for patient to participate in  the [place] program at the [facility].  A copy of the form was sent to HIM to be scanned into patient's EMR.

## 2015-03-01 ENCOUNTER — Encounter: Payer: Self-pay | Admitting: Oncology

## 2015-03-01 ENCOUNTER — Ambulatory Visit
Admission: RE | Admit: 2015-03-01 | Discharge: 2015-03-01 | Disposition: A | Discharge status: Home / Self Care | Payer: Medicaid Other | Source: Ambulatory Visit | Attending: Radiation Oncology | Admitting: Radiation Oncology

## 2015-03-01 VITALS — BP 102/62 | HR 76 | Temp 98.9°F | Resp 16 | Ht 66.5 in | Wt 143.5 lb

## 2015-03-01 DIAGNOSIS — C541 Malignant neoplasm of endometrium: Secondary | ICD-10-CM

## 2015-03-01 HISTORY — DX: Reserved for inherently not codable concepts without codable children: IMO0001

## 2015-03-01 HISTORY — DX: Reserved for concepts with insufficient information to code with codable children: IMO0002

## 2015-03-01 NOTE — Progress Notes (Signed)
Radiation Oncology         [phone] ________________________________  Name: Sheena Caldwell MRN: [id number]  Date: 03/01/2015  DOB: Sep 20, 1950  Follow-Up Visit Note  CC: HEDGECOCK,SUZANNE, PA-C  Hedgecock, Vinnie Level, PA-C   Diagnosis: Stage IIIc1 endometrial serous carcinoma  Interval Since Last Radiation:  1 month. ( July 19, July 27, July 29, August 9, January 26, 2015)  Site/dose:   Proximal vagina, 30 gray in 5 fractions, the patient completed intracavitary  Narrative:  The patient returns today for routine follow-up.  She denies pain. She does report continuing to have itching on the outside of her vagina. She says that the antifungal powder is not working and she has also tried hydrocortisone cream, which makes it worse. She reports, white, vaginal discharge which is increased in amount from what she usually has. She reports that this vaginal discharge feels different than one when she has a yeast infection. She states she has already tried nystatin and it doesn't help. She has used equate powder which has been helpful for this issue and will continue using this. Her oncologist also recommended she change her laundry detergent and Bath soap. She denies vaginal/rectal bleeding, nausea, and fatigue. [facility] [facility] She has lost 3 lbs and has been trying to lose weight. She saw Dr. Sabra Heck on 02/08/15.                     ALLERGIES:  is allergic to levaquin.  Meds: Current Outpatient Prescriptions  Medication Sig Dispense Refill  . docusate sodium (COLACE) 100 MG capsule Take 1 capsule (100 mg total) by mouth every 12 (twelve) hours. 30 capsule 0  . ferrous sulfate 325 (65 FE) MG tablet Take 325 mg by mouth daily.    Marland Kitchen loratadine (CLARITIN) 10 MG tablet Take 10 mg by mouth daily as needed for allergies.     . magnesium citrate solution Take 296 mLs by mouth once. OTC 300 mL 0  . Multiple Vitamin (MULTI-VITAMINS) TABS Take 1 tablet by mouth daily.     . naproxen sodium (ANAPROX) 220 MG tablet Take 220 mg by mouth 2 (two) times daily as needed (pain).     . polyethylene glycol powder (GLYCOLAX/MIRALAX) powder Take 17 g by mouth 2 (two) times daily. Until daily soft stools  OTC 225 g 0  . HYDROcodone-acetaminophen (NORCO/VICODIN) 5-325 MG per tablet Take 1 tablet by mouth every 4 (four) hours as needed for moderate pain.    . hydrocortisone cream 1 % Apply 1 application topically 2 (two) times daily as needed for itching.    . ondansetron (ZOFRAN) 8 MG tablet Take 8 mg by mouth every 8 (eight) hours as needed for nausea or vomiting.    . promethazine (PHENERGAN) 12.5 MG tablet Take 12.5 mg by mouth every 6 (six) hours as needed for nausea or vomiting.     No current facility-administered medications for this encounter.    Physical Findings: The patient is in no acute distress. Patient is alert and oriented.  height is 5' 6.5" (1.689 m) and weight is 143 lb 8 oz (65.091 kg). Her oral temperature is 98.9 F (37.2 C). Her blood pressure is 102/62 and her pulse is 76. Her respiration is 16.  A pelvic exam was not conducted during this encounter in light of her recent completion of treatment.  Lungs are clear to auscultation bilaterally. Heart has regular rate and rhythm. No palpable cervical, supraclavicular, or  axillary adenopathy.   Lab Findings: Lab Results  Component Value Date   WBC 3.8* 12/25/2014   HGB 13.7 12/25/2014   HCT 40.8 12/25/2014   MCV 107.4* 12/25/2014   PLT 204 12/25/2014    Radiographic Findings: No results found.  Impression:  The patient is recovering from the effects of radiation.  Plan: A vaginal dilator was given during this encounter. I advised her to use it 3 times a week. She is scheduled to see Dr. Marko Plume on October 6 and will f/u with Dr. Sabra Heck in November. The pt will f/u with me in February 2017. The patient will call if the perineal itching does not improve with above interventions.  This document  serves as a record of services personally performed by [name], MD. It was created on his behalf by Darcus Austin, a trained medical scribe. The creation of this record is based on the scribe's personal observations and the provider's statements to them. This document has been checked and approved by the attending provider.  ____________________________________  Blair Promise, PhD, MD

## 2015-03-01 NOTE — Progress Notes (Signed)
Sheena Caldwell has been given a size M vaginal dilator and Surgilube.  She was instructed to apply the Surgilube to the dilator and to insert it for 10 minutes 3 times a week (Monday, Wednesday, Friday) and to clean it with soap and water after use.  Sheena Caldwell verbalized understanding and agreement in teach back mode.

## 2015-03-01 NOTE — Progress Notes (Signed)
Sheena Caldwell here for follow up.  She denies pain.  She does report continuing to have vaginal itching.  She says that the antifungal powder is not working and she has also tried hydrocortisone cream which makes it worse.  She reports having a white vaginal discharge which is increased in amount from what she usually has.  She denies having vaginal/rectal bleeding, nausea and fatigue. [facility] [facility]  She has lost 3 lbs and has been trying to loose wieght.  BP 102/62 mmHg  Pulse 76  Temp(Src) 98.9 F (37.2 C) (Oral)  Resp 16  Ht 5' 6.5" (1.689 m)  Wt 143 lb 8 oz (65.091 kg)  BMI 22.82 kg/m2   Wt Readings from Last 3 Encounters:  03/01/15 143 lb 8 oz (65.091 kg)  01/03/15 146 lb 1.6 oz (66.271 kg)  12/25/14 146 lb (66.225 kg)

## 2015-03-02 ENCOUNTER — Encounter: Payer: Self-pay | Admitting: Oncology

## 2015-03-02 ENCOUNTER — Telehealth: Payer: Self-pay

## 2015-03-02 NOTE — Telephone Encounter (Signed)
Told Ms. Strnad thet the approval form for [employer] is signed and she can pick it up up front at [facility] between 9 am and 4 pm M-F.  Patient verbalized understanding.  Copy sent to HIM to be scanned into patient's EMR.

## 2015-03-02 NOTE — Progress Notes (Signed)
Medical Oncology  Requested HIM request copy of most recent note from 01-2015 from Dr Hazle Nordmann, to have available to MD either by time of next visit here.  Godfrey Pick, MD

## 2015-03-22 ENCOUNTER — Other Ambulatory Visit: Payer: Self-pay | Admitting: Oncology

## 2015-03-22 ENCOUNTER — Other Ambulatory Visit: Payer: Self-pay

## 2015-03-22 DIAGNOSIS — C541 Malignant neoplasm of endometrium: Secondary | ICD-10-CM

## 2015-03-23 ENCOUNTER — Ambulatory Visit (HOSPITAL_BASED_OUTPATIENT_CLINIC_OR_DEPARTMENT_OTHER): Payer: Medicaid Other

## 2015-03-23 ENCOUNTER — Telehealth: Payer: Self-pay | Admitting: Oncology

## 2015-03-23 ENCOUNTER — Ambulatory Visit (HOSPITAL_BASED_OUTPATIENT_CLINIC_OR_DEPARTMENT_OTHER): Payer: Medicaid Other | Admitting: Oncology

## 2015-03-23 ENCOUNTER — Other Ambulatory Visit (HOSPITAL_BASED_OUTPATIENT_CLINIC_OR_DEPARTMENT_OTHER): Payer: Medicaid Other

## 2015-03-23 ENCOUNTER — Encounter: Payer: Self-pay | Admitting: Oncology

## 2015-03-23 VITALS — BP 113/72 | HR 64 | Temp 98.3°F | Resp 18 | Ht 66.5 in | Wt 145.5 lb

## 2015-03-23 DIAGNOSIS — D72819 Decreased white blood cell count, unspecified: Secondary | ICD-10-CM | POA: Diagnosis not present

## 2015-03-23 DIAGNOSIS — M160 Bilateral primary osteoarthritis of hip: Secondary | ICD-10-CM

## 2015-03-23 DIAGNOSIS — I712 Thoracic aortic aneurysm, without rupture: Secondary | ICD-10-CM | POA: Diagnosis not present

## 2015-03-23 DIAGNOSIS — M479 Spondylosis, unspecified: Secondary | ICD-10-CM | POA: Diagnosis not present

## 2015-03-23 DIAGNOSIS — C541 Malignant neoplasm of endometrium: Secondary | ICD-10-CM

## 2015-03-23 DIAGNOSIS — D6481 Anemia due to antineoplastic chemotherapy: Secondary | ICD-10-CM | POA: Diagnosis not present

## 2015-03-23 DIAGNOSIS — G620 Drug-induced polyneuropathy: Secondary | ICD-10-CM | POA: Diagnosis not present

## 2015-03-23 DIAGNOSIS — I700 Atherosclerosis of aorta: Secondary | ICD-10-CM | POA: Diagnosis not present

## 2015-03-23 DIAGNOSIS — G2581 Restless legs syndrome: Secondary | ICD-10-CM

## 2015-03-23 DIAGNOSIS — T451X5A Adverse effect of antineoplastic and immunosuppressive drugs, initial encounter: Secondary | ICD-10-CM

## 2015-03-23 DIAGNOSIS — R3129 Other microscopic hematuria: Secondary | ICD-10-CM

## 2015-03-23 DIAGNOSIS — Z95828 Presence of other vascular implants and grafts: Secondary | ICD-10-CM

## 2015-03-23 DIAGNOSIS — Z1239 Encounter for other screening for malignant neoplasm of breast: Secondary | ICD-10-CM

## 2015-03-23 LAB — COMPREHENSIVE METABOLIC PANEL (CC13)
ALBUMIN: 3.7 g/dL (ref 3.5–5.0)
ALT: 15 U/L (ref 0–55)
AST: 18 U/L (ref 5–34)
Alkaline Phosphatase: 111 U/L (ref 40–150)
Anion Gap: 9 mEq/L (ref 3–11)
BUN: 11.3 mg/dL (ref 7.0–26.0)
CALCIUM: 9.5 mg/dL (ref 8.4–10.4)
CO2: 24 mEq/L (ref 22–29)
Chloride: 109 mEq/L (ref 98–109)
Creatinine: 0.8 mg/dL (ref 0.6–1.1)
EGFR: 88 mL/min/{1.73_m2} — AB (ref >90)
GLUCOSE: 84 mg/dL (ref 70–140)
Potassium: 4.3 mEq/L (ref 3.5–5.1)
SODIUM: 142 meq/L (ref 136–145)
Total Bilirubin: 0.42 mg/dL (ref 0.20–1.20)
Total Protein: 7.8 g/dL (ref 6.4–8.3)

## 2015-03-23 LAB — CBC WITH DIFFERENTIAL/PLATELET
BASO%: 1.1 % (ref 0.0–2.0)
Basophils Absolute: 0 10*3/uL (ref 0.0–0.1)
EOS%: 3.1 % (ref 0.0–7.0)
Eosinophils Absolute: 0.1 10*3/uL (ref 0.0–0.5)
HCT: 38.6 % (ref 34.8–46.6)
HEMOGLOBIN: 13.1 g/dL (ref 11.6–15.9)
LYMPH#: 1.1 10*3/uL (ref 0.9–3.3)
LYMPH%: 42.4 % (ref 14.0–49.7)
MCH: 35.6 pg — ABNORMAL HIGH (ref 25.1–34.0)
MCHC: 33.9 g/dL (ref 31.5–36.0)
MCV: 104.9 fL — ABNORMAL HIGH (ref 79.5–101.0)
MONO#: 0.2 10*3/uL (ref 0.1–0.9)
MONO%: 8.4 % (ref 0.0–14.0)
NEUT#: 1.2 10*3/uL — ABNORMAL LOW (ref 1.5–6.5)
NEUT%: 45 % (ref 38.4–76.8)
Platelets: 191 10*3/uL (ref 145–400)
RBC: 3.68 10*6/uL — ABNORMAL LOW (ref 3.70–5.45)
RDW: 13.3 % (ref 11.2–14.5)
WBC: 2.6 10*3/uL — ABNORMAL LOW (ref 3.9–10.3)
nRBC: 0 % (ref 0–0)

## 2015-03-23 MED ORDER — SODIUM CHLORIDE 0.9 % IJ SOLN
10.0000 mL | INTRAMUSCULAR | Status: DC | PRN
  Administered 2015-03-23: 10 mL via INTRAVENOUS
  Filled 2015-03-23: qty 10

## 2015-03-23 MED ORDER — HEPARIN SOD (PORK) LOCK FLUSH 100 UNIT/ML IV SOLN
500.0000 [IU] | Freq: Once | INTRAVENOUS | Status: AC
  Administered 2015-03-23: 500 [IU] via INTRAVENOUS
  Filled 2015-03-23: qty 5

## 2015-03-23 NOTE — Telephone Encounter (Signed)
Patient had to leave,appointments made and avs printed and mailed

## 2015-03-23 NOTE — Progress Notes (Signed)
OFFICE PROGRESS NOTE   March 23, 2015   Sheena Caldwell(PCP, [facility] [employer]), _ [name] [facility]); Sheena Caldwell, Sheena Caldwell   INTERVAL HISTORY:  Patient is seen, alone for visit,  in follow up of IIIC high grade serous endometrial carcinoma. Since she was here last in  11-2014, she had vaginal brachytherapy by Sheena Sheena Caldwell completed 01-26-15 and has seen Sheena Caldwell, most recently ~ 02-2015 (note to be requested).  Last imaging was CT AP 12-25-14. She has PAC, flushed today.   Patient tells me that she is doing well, with good report from last visit with Sheena Caldwell. Energy and appetite are good, bowels are moving regularly without medication, she is using vaginal dilator as directed. She denies abdominal or pelvic pain, no LE swelling, no bladder symptoms, no fever or symptoms of infection, no bleeding. No problems with PAC.   PAC in Flu vaccine to be done 03-27-15 Overdue mammograms which we will schedule No genetics testing  ONCOLOGIC HISTORY Patient had one episode of post menopausal vaginal bleeding early 2015. Later in 2015 she had persistent vaginal discharge, for which she was seen by PCP Sheena Caldwell in ~ 02-2014, had CTs in [facility] and was referred to Sheena Sheena Caldwell, gyn in [facility]. Endometrial biopsy had concern (that path not included in information sent for consultation) such that she was referred to Sheena Sheena Caldwell. Surgery by Sheena Caldwell at [facility] in [place] on 04-06-14 was TAH, BSO, omentectomy, pelvic and right common iliac node evaluation; patient was hospitalized x 3 days and tells me that she recovered well from the surgery. Pathology [phone]) from 04-06-14 found high grade serous carcinoma involving entire thickness of myometrium (depth of invasion 1.1 cm out of 1.1 cm), with invasion of cervical stroma, no involvement of uterine serosa/bilateral tubes and ovaries/omentum,  positive LVSI, 2/5 right pelvic nodes, 2 of 4 left pelvic nodes and 0/1 right common iliac node. Bulk of the carcinoma was in lower uterine segment where it was within 0.1 cm of serosal surface. Cytology positive for adenocarcinoma on washings ([id number]). Surgical findings were significant for no paraaortic adenopathy apparent. Patient has received 3 cycles of adjuvant chemotherapy in [facility] by Sheena Caldwell, on 05-05-14, 05-26-14 and 07-29-14. Per records and patient, delay of cycle 3 was related to low counts and insurance changes. She received neulasta after chemotherapy on 07-29-14. Taxol was given at 175 mg/m2 for total dose was 280 mg cycle 1 and 291 mg for cycles 2 and 3; carboplatin was AUC = 6 with total dose 610 mg cycle 1, 620 mg cycle 2 and 630 mg cycle 3.. She does not recall using oral decadron premed for taxol, with premeds listed on chemo flowsheets standard decadron 20 mg, zofran 16 mg, benadryl 50 mg (which caused severe restless legs) and pepcid 20 mg. Outside information does not include serial blood counts. Last note from Sheena Caldwell dated 07-29-14 describes unremarkable exam, "NED during chemotherapy and adequate PS". Patient was see by Sheena Denman George 08-09-14, who recommends restaging scans after 6 cycles of chemotherapy and consideration of vaginal brachytherapy; she has not been seen by radiation oncology in [place]. CT AP done in [facility] system 08-15-14 (due to elevated LFTs and blood in urine just after transfer of care) with soft tissue fullness at superior abdominal aorta and question of retroperitoneal necrotic adenopathy; plan repeat scans after complete present chemotherapy. Cycle 4 carbo taxol given 08-24-14 at [facility], with neulasta.Cycle  6 was delayed with symptomatic anemia and other issues, given 10-21-14 as carboplatin only due to peripheral neuropathy in feet.  Radiation treatment dates: July 19, July 27, July 29, August 9, August 11 Site/dose: Proximal vagina, 30 gray in 5  fractions  Review of systems as above, also: No pain. No SOB or other respiratory symptoms. No noted changes in breasts. Still slight peripheral neuropathy distal feet Remainder of 10 point Review of Systems negative.  Objective:  Vital signs in last 24 hours:  BP 113/72 mmHg  Pulse 64  Temp(Src) 98.3 F (36.8 C) (Oral)  Resp 18  Ht 5' 6.5" (1.689 m)  Wt 145 lb 8 oz (65.998 kg)  BMI 23.14 kg/m2  SpO2 100% Weight up 2 lbs Alert, oriented and appropriate. Ambulatory without difficulty.  No alopecia  HEENT:PERRL, sclerae not icteric. Oral mucosa moist without lesions, posterior pharynx clear.  Neck supple. No JVD.  Lymphatics:no cervical,supraclavicular, axillary or inguinal adenopathy Resp: clear to auscultation bilaterally and normal percussion bilaterally Cardio: regular rate and rhythm. No gallop. GI: soft, nontender, not distended, no mass or organomegaly. Normally active bowel sounds. Surgical incision not remarkable. Musculoskeletal/ Extremities: without pitting edema, cords, tenderness Neuro: slight residual peripheral neuropathy toes. Otherwise nonfocal. PSYCH appropriate mood and affect Skin without rash, ecchymosis, petechiae Breasts: without dominant mass, skin or nipple findings. Axillae benign. Portacath-without erythema or tenderness  Lab Results:  Results for orders placed or performed in visit on 03/23/15  CBC with Differential  Result Value Ref Range   WBC 2.6 (L) 3.9 - 10.3 10e3/uL   NEUT# 1.2 (L) 1.5 - 6.5 10e3/uL   HGB 13.1 11.6 - 15.9 g/dL   HCT 38.6 34.8 - 46.6 %   Platelets 191 145 - 400 10e3/uL   MCV 104.9 (H) 79.5 - 101.0 fL   MCH 35.6 (H) 25.1 - 34.0 pg   MCHC 33.9 31.5 - 36.0 g/dL   RBC 3.68 (L) 3.70 - 5.45 10e6/uL   RDW 13.3 11.2 - 14.5 %   lymph# 1.1 0.9 - 3.3 10e3/uL   MONO# 0.2 0.1 - 0.9 10e3/uL   Eosinophils Absolute 0.1 0.0 - 0.5 10e3/uL   Basophils Absolute 0.0 0.0 - 0.1 10e3/uL   NEUT% 45.0 38.4 - 76.8 %   LYMPH% 42.4 14.0 - 49.7  %   MONO% 8.4 0.0 - 14.0 %   EOS% 3.1 0.0 - 7.0 %   BASO% 1.1 0.0 - 2.0 %   nRBC 0 0 - 0 %  Comprehensive metabolic panel (Cmet) - [facility]  Result Value Ref Range   Sodium 142 136 - 145 mEq/L   Potassium 4.3 3.5 - 5.1 mEq/L   Chloride 109 98 - 109 mEq/L   CO2 24 22 - 29 mEq/L   Glucose 84 70 - 140 mg/dl   BUN 11.3 7.0 - 26.0 mg/dL   Creatinine 0.8 0.6 - 1.1 mg/dL   Total Bilirubin 0.42 0.20 - 1.20 mg/dL   Alkaline Phosphatase 111 40 - 150 U/L   AST 18 5 - 34 U/L   ALT 15 0 - 55 U/L   Total Protein 7.8 6.4 - 8.3 g/dL   Albumin 3.7 3.5 - 5.0 g/dL   Calcium 9.5 8.4 - 10.4 mg/dL   Anion Gap 9 3 - 11 mEq/L   EGFR 88 (L) >90 ml/min/1.73 m2     Studies/Results:  No results found. Mammograms requested at [facility], where she recalls priors done  Medications: I have reviewed the patient's current medications.  DISCUSSION:  clinically doing well tho at high risk for recurrent disease. She agrees with keeping PAC for now, understands this should be flushed every 6-8 weeks when not otherwise used. She is to see Sheena Caldwell again in Dec for q 3 month follow up at that office, and will see Sheena Sheena Caldwell in Feb. I will follow here primarily to be sure [facility] care done, and other assistance with oncology situation as needed.  Assessment/Plan:   1.IIIC1 high grade serous endometrial carcinoma: Post optimal debulking by Sheena Sheena Caldwell 04-06-14 and 3 cycles of adjuvant taxol carboplatin from 05-05-14 thru 07-29-13. Question of retroperitoneal and para-aortic adenopathy by CT 08-15-14. 3 additional cycles of chemotherapy given in [place] from 08-24-14 thru 10-21-14, last with [place] only due to taxol neuropathy. No known active disease now, tho she is at high risk. Vaginal brachytherapy completed 01-2015. Sheena Caldwell following q 3 months.  2.chemo anemia improved. 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 Encouraged lots of exercise  and follow 4.microhematuria: urology evaluation including cystoscopy not remarkable 5.baseline low BP per outside medical records. 6.PAC in: needs flush every 6-8 weeks if not used otherwise, done at this office for now 7.long tobacco recently [facility]. Stable pulmonary nodules on CT. 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 Restless legs with benadryl 13. 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 Ascending thoracic aortic aneurysm reported on CT chest 11-04-14. This information to be sent to patient's PCP. I was not aware of these findings until after visit and did not discuss with her today. 16. 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 Patient is in agreement with recommendations and plans. I will see her in 6 mo or sooner if needed. CC Sheena Caldwell, Sheena Almedia Balls.  Time spent 20 min including >50% counseling and coordination of care   LIVESAY,LENNIS P, MD   03/23/2015, 11:37 AM

## 2015-03-29 ENCOUNTER — Telehealth: Payer: Self-pay

## 2015-03-29 NOTE — Telephone Encounter (Signed)
Ms. Greeley inquired if this nurse just called her.  The [facility] number showed up on her phone but no message was left.  Told her that this nurse did not call her , however, she may have been called to remind her of her flu shot appointment on 03-31-15 at 1330.  Pt appreciated the reminder.

## 2015-03-31 ENCOUNTER — Ambulatory Visit (HOSPITAL_BASED_OUTPATIENT_CLINIC_OR_DEPARTMENT_OTHER): Payer: Medicaid Other

## 2015-03-31 VITALS — BP 109/89 | Temp 98.5°F | Resp 18

## 2015-03-31 DIAGNOSIS — Z23 Encounter for immunization: Secondary | ICD-10-CM

## 2015-03-31 DIAGNOSIS — Z299 Encounter for prophylactic measures, unspecified: Secondary | ICD-10-CM

## 2015-03-31 MED ORDER — INFLUENZA VAC SPLIT QUAD 0.5 ML IM SUSY
0.5000 mL | PREFILLED_SYRINGE | Freq: Once | INTRAMUSCULAR | Status: AC
  Administered 2015-03-31: 0.5 mL via INTRAMUSCULAR
  Filled 2015-03-31: qty 0.5

## 2015-03-31 NOTE — Patient Instructions (Signed)

## 2015-04-07 ENCOUNTER — Ambulatory Visit
Admission: RE | Admit: 2015-04-07 | Discharge: 2015-04-07 | Disposition: A | Discharge status: Home / Self Care | Payer: Medicaid Other | Source: Ambulatory Visit | Attending: Oncology | Admitting: Oncology

## 2015-04-07 DIAGNOSIS — Z1239 Encounter for other screening for malignant neoplasm of breast: Secondary | ICD-10-CM

## 2015-05-03 ENCOUNTER — Ambulatory Visit (AMBULATORY_SURGERY_CENTER): Payer: Self-pay | Admitting: *Deleted

## 2015-05-03 VITALS — Ht 66.5 in | Wt 142.6 lb

## 2015-05-03 DIAGNOSIS — Z1211 Encounter for screening for malignant neoplasm of colon: Secondary | ICD-10-CM

## 2015-05-03 MED ORDER — NA SULFATE-K SULFATE-MG SULF 17.5-3.13-1.6 GM/177ML PO SOLN: ORAL | Status: DC

## 2015-05-03 NOTE — Progress Notes (Signed)
No allergies to eggs or soy. No problems with anesthesia.  Pt given Emmi instructions for colonoscopy  No oxygen use  No diet drug use  

## 2015-05-18 ENCOUNTER — Ambulatory Visit (HOSPITAL_BASED_OUTPATIENT_CLINIC_OR_DEPARTMENT_OTHER): Payer: Medicaid Other

## 2015-05-18 VITALS — BP 103/66 | HR 79 | Temp 98.4°F | Resp 18

## 2015-05-18 DIAGNOSIS — C541 Malignant neoplasm of endometrium: Secondary | ICD-10-CM | POA: Diagnosis not present

## 2015-05-18 DIAGNOSIS — Z452 Encounter for adjustment and management of vascular access device: Secondary | ICD-10-CM | POA: Diagnosis not present

## 2015-05-18 DIAGNOSIS — Z95828 Presence of other vascular implants and grafts: Secondary | ICD-10-CM

## 2015-05-18 MED ORDER — SODIUM CHLORIDE 0.9 % IJ SOLN
10.0000 mL | INTRAMUSCULAR | Status: DC | PRN
Start: 2015-05-18 — End: 2015-05-18
  Administered 2015-05-18: 10 mL via INTRAVENOUS
  Filled 2015-05-18: qty 10

## 2015-05-18 MED ORDER — HEPARIN SOD (PORK) LOCK FLUSH 100 UNIT/ML IV SOLN
500.0000 [IU] | Freq: Once | INTRAVENOUS | Status: AC
  Administered 2015-05-18: 500 [IU] via INTRAVENOUS
  Filled 2015-05-18: qty 5

## 2015-05-18 NOTE — Patient Instructions (Signed)

## 2015-05-23 ENCOUNTER — Encounter: Payer: Self-pay | Admitting: Gastroenterology

## 2015-05-23 ENCOUNTER — Ambulatory Visit (AMBULATORY_SURGERY_CENTER): Payer: Medicaid Other | Admitting: Gastroenterology

## 2015-05-23 VITALS — BP 115/55 | HR 65 | Temp 97.2°F | Resp 18 | Ht 66.5 in | Wt 142.0 lb

## 2015-05-23 DIAGNOSIS — Z1211 Encounter for screening for malignant neoplasm of colon: Secondary | ICD-10-CM

## 2015-05-23 MED ORDER — SODIUM CHLORIDE 0.9 % IV SOLN: 500.0000 mL | INTRAVENOUS | Status: DC

## 2015-05-23 NOTE — Progress Notes (Signed)
Patient awakening,vss,report to rn 

## 2015-05-23 NOTE — Op Note (Addendum)
[place]  [address]. [place], [address]   COLONOSCOPY PROCEDURE REPORT  PATIENT: Sheena, Caldwell  MR#: [id number] BIRTHDATE: 1950-11-17 , 64  yrs. old GENDER: female ENDOSCOPIST: Milus Banister, MD REFERRED BY: Ardith Dark PROCEDURE DATE:  05/23/2015 PROCEDURE:   Colonoscopy, diagnostic First Screening Colonoscopy - Avg.  risk and is 50 yrs.  old or older Yes.  Prior Negative Screening - Now for repeat screening. N/A  History of Adenoma - Now for follow-up colonoscopy & has been > or = to 3 yrs.  N/A  Recommend repeat exam, <10 yrs? No ASA CLASS:   Class III INDICATIONS:routine risk for colon cancer. MEDICATIONS: Monitored anesthesia care and Propofol 150 mg IV  DESCRIPTION OF PROCEDURE:   After the risks benefits and alternatives of the procedure were thoroughly explained, informed consent was obtained.  The digital rectal exam revealed no abnormalities of the rectum.   The LB 1528  endoscope was introduced through the anus and advanced to the sigmoid colon. No adverse events experienced.   Limited by a stricture in sigmoid colon.   The quality of the prep was excellent.  The instrument was then slowly withdrawn as the colon was fully examined. Estimated blood loss is zero unless otherwise noted in this procedure report.  COLON FINDINGS: I was unable to advance the pediatric colonoscope past the sigmoid colon.  The sigmoid segment felt fixed in place. The examination was aborted.  Retroflexed views revealed no abnormalities. The time to cecum = NA Withdrawal time = NA   The scope was withdrawn and the procedure completed. COMPLICATIONS: There were no immediate complications.  ENDOSCOPIC IMPRESSION: I was unable to advance the pediatric colonoscope past the sigmoid colon.  The sigmoid segment felt fixed in place.  The examination was aborted.  I suspect the difficulty is due to her known Stage IIIC endometrial cancer, treatment for the same  (including surgery, chemotherapy, radiation) throughout 2015, 2016.  RECOMMENDATIONS: Return to your PCP.  eSigned:  Milus Banister, MD 05/23/2015 2:56 PM Revised: 05/23/2015 2:56 PM  Craig Staggers, MD

## 2015-05-23 NOTE — Patient Instructions (Signed)
YOU HAD AN ENDOSCOPIC PROCEDURE TODAY AT [facility]:   Refer to the procedure report that was given to you for any specific questions about what was found during the examination.  If the procedure report does not answer your questions, please call your gastroenterologist to clarify.  If you requested that your care partner not be given the details of your procedure findings, then the procedure report has been included in a sealed envelope for you to review at your convenience later.  YOU SHOULD EXPECT: Some feelings of bloating in the abdomen. Passage of more gas than usual.  Walking can help get rid of the air that was put into your GI tract during the procedure and reduce the bloating. If you had a lower endoscopy (such as a colonoscopy or flexible sigmoidoscopy) you may notice spotting of blood in your stool or on the toilet paper. If you underwent a bowel prep for your procedure, you may not have a normal bowel movement for a few days.  Please Note:  You might notice some irritation and congestion in your nose or some drainage.  This is from the oxygen used during your procedure.  There is no need for concern and it should clear up in a day or so.  SYMPTOMS TO REPORT IMMEDIATELY:   Following lower endoscopy (colonoscopy or flexible sigmoidoscopy):  Excessive amounts of blood in the stool  Significant tenderness or worsening of abdominal pains  Swelling of the abdomen that is new, acute  Fever of 100F or higher   For urgent or emergent issues, a gastroenterologist can be reached at any hour by calling [phone].   DIET: Your first meal following the procedure should be a small meal and then it is ok to progress to your normal diet. Heavy or fried foods are harder to digest and may make you feel nauseous or bloated.  Likewise, meals heavy in dairy and vegetables can increase bloating.  Drink plenty of fluids but you should avoid alcoholic beverages for 24  hours.  ACTIVITY:  You should plan to take it easy for the rest of today and you should NOT DRIVE or use heavy machinery until tomorrow (because of the sedation medicines used during the test).    FOLLOW UP: Our staff will call the number listed on your records the next business day following your procedure to check on you and address any questions or concerns that you may have regarding the information given to you following your procedure. If we do not reach you, we will leave a message.  However, if you are feeling well and you are not experiencing any problems, there is no need to return our call.  We will assume that you have returned to your regular daily activities without incident.  If any biopsies were taken you will be contacted by phone or by letter within the next 1-3 weeks.  Please call us at [phone] if you have not heard about the biopsies in 3 weeks.    SIGNATURES/CONFIDENTIALITY: You and/or your care partner have signed paperwork which will be entered into your electronic medical record.  These signatures attest to the fact that that the information above on your After Visit Summary has been reviewed and is understood.  Full responsibility of the confidentiality of this discharge information lies with you and/or your care-partner.  See your primary care physician as needed.

## 2015-05-24 ENCOUNTER — Telehealth: Payer: Self-pay | Admitting: *Deleted

## 2015-05-24 NOTE — Telephone Encounter (Signed)
Name identifier, left message, follow-up 

## 2015-07-13 ENCOUNTER — Ambulatory Visit (HOSPITAL_BASED_OUTPATIENT_CLINIC_OR_DEPARTMENT_OTHER): Payer: Medicaid Other

## 2015-07-13 VITALS — BP 98/62 | HR 89 | Temp 98.3°F | Resp 18

## 2015-07-13 DIAGNOSIS — C541 Malignant neoplasm of endometrium: Secondary | ICD-10-CM

## 2015-07-13 DIAGNOSIS — Z452 Encounter for adjustment and management of vascular access device: Secondary | ICD-10-CM | POA: Diagnosis present

## 2015-07-13 DIAGNOSIS — Z95828 Presence of other vascular implants and grafts: Secondary | ICD-10-CM

## 2015-07-13 MED ORDER — HEPARIN SOD (PORK) LOCK FLUSH 100 UNIT/ML IV SOLN
500.0000 [IU] | Freq: Once | INTRAVENOUS | Status: AC
  Administered 2015-07-13: 500 [IU] via INTRAVENOUS
  Filled 2015-07-13: qty 5

## 2015-07-13 MED ORDER — SODIUM CHLORIDE 0.9% FLUSH
10.0000 mL | INTRAVENOUS | Status: DC | PRN
  Administered 2015-07-13: 10 mL via INTRAVENOUS
  Filled 2015-07-13: qty 10

## 2015-07-13 NOTE — Patient Instructions (Signed)

## 2015-07-27 ENCOUNTER — Encounter: Payer: Self-pay | Admitting: Oncology

## 2015-07-27 ENCOUNTER — Other Ambulatory Visit: Payer: Self-pay | Admitting: Oncology

## 2015-07-27 ENCOUNTER — Ambulatory Visit: Payer: Medicaid Other | Admitting: Radiation Oncology

## 2015-07-27 ENCOUNTER — Telehealth: Payer: Self-pay | Admitting: Oncology

## 2015-07-27 ENCOUNTER — Telehealth: Payer: Self-pay

## 2015-07-27 DIAGNOSIS — C541 Malignant neoplasm of endometrium: Secondary | ICD-10-CM

## 2015-07-27 NOTE — Telephone Encounter (Signed)
Returned patient call re needing an appointment w/LL asap. Patient has exisiting appointment for lab/fu 3/23. Spoke with patient and per patient she has some new concerns going on with her white blood count and is feeling very weak. Patient forwarded to desk nurse (voicemail). This message has been routed to LL/desk nurse.

## 2015-07-27 NOTE — Telephone Encounter (Signed)
-----   Message from Gordy Levan, MD sent at 07/27/2015  1:52 PM EST ----- FYI Labs from [facility] at [facility] received POF sent to schedulers to move apt earlier than 09-07-15, tho is not urgent.   L.Livesay

## 2015-07-27 NOTE — Telephone Encounter (Signed)
Re triage note "labs from PCP low"  I am not aware of any recent labs Patient needs to tell us when labs were done and where / by which MD. Need to request results if we have not received.  RN ask her if any fever, any bleeding  thanks

## 2015-07-27 NOTE — Progress Notes (Unsigned)
Medical Oncology  Labs received from [facility] at [facility] MD / [place] [phone]) from 07-25-15  WBC 4.9, ANC 3.6, Hgb 10.4, MCV 103, plt 370, RDW 13.8, lymphs 19%, monos 5.7 %, eos 1%, baso 0.8% CMET with Na 135, K 3.9, glu 121, BUN 10, creat 0.8, T bili 0.3, AST 15, ALT 14, alb 3.3, Ca 9.4TSH 0.5, ferritin 296 (11-307), B12 480 (180-914), folate WNL >23  Report sent to be scanned into EMR  L.Marko Plume, MD

## 2015-07-27 NOTE — Telephone Encounter (Signed)
Pt left msg reporting she went to her MD and her counts were low.  Reports her MD was to fax lab results to Dr. Marko Plume.  Pt would like to know if she needs an appt to discuss the results further.  Routed to [facility] 1 for further action

## 2015-07-27 NOTE — Telephone Encounter (Signed)
S/w pt per Dr Edwyna Shell message.  Pt has been very weak for over a month. She just wants to stay in the bed. She took the trash out and had to rest part way up driveway. Pt has a head cold started Monday, nasal congestion clear sputum, no fever. Sore throat went away with tea and cough drops. She had diarrhea last week that is back to normal BMs.Marland Kitchen

## 2015-07-28 NOTE — Telephone Encounter (Addendum)
S/w pt that Dr Edwyna Shell reviewed her labs from her PCP and had her appt moved to 08/17/15 at 1115. Pt was hoping for an appt sooner because she is so weak she hardly wants to get out of bed. She had to rest taking her trash out. Explained that the lab work from PCP did not look very bad per Dr Edwyna Shell.

## 2015-07-29 ENCOUNTER — Telehealth: Payer: Self-pay | Admitting: Oncology

## 2015-07-29 NOTE — Telephone Encounter (Signed)
Spoke with patient re 3/2 appointments.

## 2015-08-09 ENCOUNTER — Encounter: Payer: Self-pay | Admitting: Oncology

## 2015-08-10 ENCOUNTER — Encounter: Payer: Self-pay | Admitting: Radiation Oncology

## 2015-08-10 ENCOUNTER — Telehealth: Payer: Self-pay | Admitting: Oncology

## 2015-08-10 ENCOUNTER — Ambulatory Visit
Admission: RE | Admit: 2015-08-10 | Discharge: 2015-08-10 | Disposition: A | Discharge status: Home / Self Care | Payer: Medicaid Other | Source: Ambulatory Visit | Attending: Radiation Oncology | Admitting: Radiation Oncology

## 2015-08-10 VITALS — BP 94/70 | HR 73 | Temp 98.9°F | Resp 16 | Ht 66.5 in | Wt 140.5 lb

## 2015-08-10 DIAGNOSIS — C541 Malignant neoplasm of endometrium: Secondary | ICD-10-CM

## 2015-08-10 NOTE — Telephone Encounter (Signed)
Received phone call message  from Elmo Putt ,RN with Dr. Sondra Come this afternoon.   Sheena Caldwell saw Dr. Sondra Come today and is feeling much better.  She wants to cancel her appointment with Dr. Marko Plume on 08-17-15 and keep appointment on 09-07-15.

## 2015-08-10 NOTE — Progress Notes (Addendum)
Sheena Caldwell here for follow up.  She denies having pain and bladder issues.  She reports having constipation.  She is taking Smooth Move tea, colace, miralax and mag citrate as needed.  Her last bm was today.  She denies having vaginal/rectal bleeding.  She reports having white vaginal discharge and thinks it may be due to the surgilube she uses with her dilator.  She reports having a poor appetite which has been worse since she started taking Zoloft 2 weeks ago.  She reports she only wanted to sleep before starting the Zoloft.  She reports her energy level is better now.  BP 94/70 mmHg  Pulse 73  Temp(Src) 98.9 F (37.2 C) (Oral)  Resp 16  Ht 5' 6.5" (1.689 m)  Wt 140 lb 8 oz (63.73 kg)  BMI 22.34 kg/m2  SpO2 100%   Wt Readings from Last 3 Encounters:  08/10/15 140 lb 8 oz (63.73 kg)  05/23/15 142 lb (64.411 kg)  05/03/15 142 lb 9.6 oz (64.683 kg)

## 2015-08-10 NOTE — Telephone Encounter (Addendum)
Called Tria and changed her follow up appointment today to 2 pm instead of 4:40 pm.

## 2015-08-10 NOTE — Progress Notes (Signed)
Radiation Oncology         [phone] ________________________________  Name: Sheena Caldwell MRN: [id number]  Date: 08/10/2015  DOB: 1950-06-19  Follow-Up Visit Note  CC: HEDGECOCK,SUZANNE, PA-C  Livesay, Tamala Julian, MD  Diagnosis: Stage IIIc1 endometrial serous carcinoma  Interval Since Last Radiation: 6 months.  July 19, July 27, July 29, August 9, January 26, 2015:  Proximal vagina, 30 gray in 5 fractions, the patient completed intracavitary  Narrative:  The patient returns today for routine follow-up. She denies pain or bladder issues. She reports constipation. She is taking Smooth Move tea, colace, miralax and mag citrate as needed. Her last bm was today. She denies vaginal/rectal bleeding. She reports having white vaginal discharge and thinks it may be due to the surgilube she uses with her dilator. She states that this does not feel like a yeast infection. She reports having a poor appetite which has been worse since she started taking Zoloft about 2-3 weeks ago. She reports she only wanted to sleep before starting the Zoloft and reports her energy level is better now. Dr. Almedia Balls placed her on the Zoloft. Reports the Zoloft is helping with her depression. She had a mammogram last October which was negative for malignancy.                     ALLERGIES:  is allergic to levaquin.  Meds: Current Outpatient Prescriptions  Medication Sig Dispense Refill  . cholecalciferol (VITAMIN D) 1000 units tablet Take 1,000 Units by mouth daily.    Marland Kitchen docusate sodium (COLACE) 100 MG capsule Take 1 capsule (100 mg total) by mouth every 12 (twelve) hours. 30 capsule 0  . ferrous sulfate 325 (65 FE) MG tablet Take 325 mg by mouth daily.    . Multiple Vitamin (MULTI-VITAMINS) TABS Take 1 tablet by mouth daily.    . sertraline (ZOLOFT) 50 MG tablet Take 50 mg by mouth.     No current facility-administered medications for this encounter.    Physical Findings: The patient is in no acute distress.  Patient is alert and oriented.  height is 5' 6.5" (1.689 m) and weight is 140 lb 8 oz (63.73 kg). Her oral temperature is 98.9 F (37.2 C). Her blood pressure is 94/70 and her pulse is 73. Her respiration is 16 and oxygen saturation is 100%.   Lungs are clear to auscultation bilaterally. Heart has regular rate and rhythm. No palpable cervical, supraclavicular, or axillary adenopathy. Abdomen soft, non-tender, normal bowel sounds. No inguinal adenopathy. On pelvic examination the external genitalia were unremarkable. A speculum exam was performed. There are no mucosal lesions noted in the vaginal vault.  Pap smear not obtained today.On bimanual and rectovaginal examination there were no pelvic masses appreciated.  Lab Findings: Lab Results  Component Value Date   WBC 2.6* 03/23/2015   HGB 13.1 03/23/2015   HCT 38.6 03/23/2015   MCV 104.9* 03/23/2015   PLT 191 03/23/2015    Radiographic Findings: No results found.  Impression:  The patient is recovering from the effects of radiation. No recurrence on clinical examination.  Plan: I will see her back in 6 months and Dr. Sabra Heck will see her in 3 months. ____________________________________  Blair Promise, PhD, MD  This document serves as a record of services personally performed by [name], MD. It was created on his behalf by Darcus Austin, a trained medical scribe. The creation of this record is based on the scribe's personal observations and the provider's statements  to them. This document has been checked and approved by the attending provider.  

## 2015-08-11 NOTE — Telephone Encounter (Signed)
Spoke with Sheena Caldwell and confirmed that she wanted to cancel appointment on 08-17-15 with Dr. Marko Plume.  This is fine with Dr. Marko Plume. Sheena Caldwell will cancel 08-17-15 and keep appointment 09-07-15 as scheduled. Appointment 08-17-15 cancelled.

## 2015-08-17 ENCOUNTER — Ambulatory Visit: Payer: Medicaid Other | Admitting: Oncology

## 2015-08-17 ENCOUNTER — Other Ambulatory Visit: Payer: Medicaid Other

## 2015-08-25 ENCOUNTER — Telehealth: Payer: Self-pay | Admitting: Oncology

## 2015-08-25 ENCOUNTER — Telehealth: Payer: Self-pay

## 2015-08-25 NOTE — Telephone Encounter (Signed)
Contacted pt regarding keeping current appt 3/23 and the md will review the hem referral at that time

## 2015-08-25 NOTE — Telephone Encounter (Signed)
Pt called stating we had scheduled a hematologist appt same day as Dr Edwyna Shell and labs.

## 2015-08-25 NOTE — Telephone Encounter (Signed)
Pt said she was referred by her PCP to Dr Ernestina Penna office. I tried to have the pt hold while I s/w Dr Natalia Leatherwood nurse. Pt said I did not understand the situation and she will call back to Dr Ernestina Penna office. Louise RN apprised of situation.

## 2015-08-25 NOTE — Telephone Encounter (Signed)
Pt called inquiring about a Hem appointment. She stated that her PCP had concerns and was going to send [place] a referral.  I called the PCP office and requested recent note and labs to support dx.  In basket Dr. Marko Plume regarding pt concerns and informed that I have contact PCP office for office note and labs.  Will contact pt after info has been reviewed.

## 2015-09-03 ENCOUNTER — Other Ambulatory Visit: Payer: Self-pay | Admitting: Oncology

## 2015-09-03 DIAGNOSIS — D72819 Decreased white blood cell count, unspecified: Secondary | ICD-10-CM

## 2015-09-03 DIAGNOSIS — R3129 Other microscopic hematuria: Secondary | ICD-10-CM

## 2015-09-07 ENCOUNTER — Ambulatory Visit: Payer: Medicaid Other

## 2015-09-07 ENCOUNTER — Other Ambulatory Visit: Payer: Self-pay | Admitting: Oncology

## 2015-09-07 ENCOUNTER — Other Ambulatory Visit (HOSPITAL_BASED_OUTPATIENT_CLINIC_OR_DEPARTMENT_OTHER): Payer: Medicaid Other

## 2015-09-07 ENCOUNTER — Telehealth: Payer: Self-pay | Admitting: Oncology

## 2015-09-07 ENCOUNTER — Ambulatory Visit (HOSPITAL_BASED_OUTPATIENT_CLINIC_OR_DEPARTMENT_OTHER): Payer: Medicaid Other

## 2015-09-07 ENCOUNTER — Ambulatory Visit (HOSPITAL_BASED_OUTPATIENT_CLINIC_OR_DEPARTMENT_OTHER): Payer: Medicaid Other | Admitting: Oncology

## 2015-09-07 ENCOUNTER — Encounter: Payer: Self-pay | Admitting: Oncology

## 2015-09-07 VITALS — BP 108/73 | HR 76 | Temp 98.2°F | Resp 18 | Ht 66.5 in | Wt 143.3 lb

## 2015-09-07 DIAGNOSIS — G2581 Restless legs syndrome: Secondary | ICD-10-CM

## 2015-09-07 DIAGNOSIS — I700 Atherosclerosis of aorta: Secondary | ICD-10-CM | POA: Diagnosis not present

## 2015-09-07 DIAGNOSIS — R918 Other nonspecific abnormal finding of lung field: Secondary | ICD-10-CM | POA: Diagnosis not present

## 2015-09-07 DIAGNOSIS — Z95828 Presence of other vascular implants and grafts: Secondary | ICD-10-CM

## 2015-09-07 DIAGNOSIS — M160 Bilateral primary osteoarthritis of hip: Secondary | ICD-10-CM | POA: Diagnosis not present

## 2015-09-07 DIAGNOSIS — C541 Malignant neoplasm of endometrium: Secondary | ICD-10-CM

## 2015-09-07 DIAGNOSIS — F1290 Cannabis use, unspecified, uncomplicated: Secondary | ICD-10-CM

## 2015-09-07 DIAGNOSIS — I712 Thoracic aortic aneurysm, without rupture: Secondary | ICD-10-CM | POA: Diagnosis not present

## 2015-09-07 DIAGNOSIS — G620 Drug-induced polyneuropathy: Secondary | ICD-10-CM | POA: Diagnosis not present

## 2015-09-07 DIAGNOSIS — R3129 Other microscopic hematuria: Secondary | ICD-10-CM

## 2015-09-07 DIAGNOSIS — R319 Hematuria, unspecified: Secondary | ICD-10-CM

## 2015-09-07 DIAGNOSIS — D500 Iron deficiency anemia secondary to blood loss (chronic): Secondary | ICD-10-CM

## 2015-09-07 DIAGNOSIS — D6481 Anemia due to antineoplastic chemotherapy: Secondary | ICD-10-CM

## 2015-09-07 DIAGNOSIS — M479 Spondylosis, unspecified: Secondary | ICD-10-CM | POA: Diagnosis not present

## 2015-09-07 DIAGNOSIS — D72819 Decreased white blood cell count, unspecified: Secondary | ICD-10-CM

## 2015-09-07 DIAGNOSIS — D509 Iron deficiency anemia, unspecified: Secondary | ICD-10-CM | POA: Diagnosis not present

## 2015-09-07 DIAGNOSIS — D539 Nutritional anemia, unspecified: Secondary | ICD-10-CM

## 2015-09-07 LAB — COMPREHENSIVE METABOLIC PANEL
ALK PHOS: 118 U/L (ref 40–150)
ALT: 34 U/L (ref 0–55)
AST: 24 U/L (ref 5–34)
Albumin: 2.6 g/dL — ABNORMAL LOW (ref 3.5–5.0)
Anion Gap: 6 mEq/L (ref 3–11)
BUN: 14.4 mg/dL (ref 7.0–26.0)
CHLORIDE: 105 meq/L (ref 98–109)
CO2: 28 meq/L (ref 22–29)
Calcium: 9.7 mg/dL (ref 8.4–10.4)
Creatinine: 0.8 mg/dL (ref 0.6–1.1)
EGFR: 87 mL/min/{1.73_m2} — ABNORMAL LOW (ref >90)
Glucose: 83 mg/dl (ref 70–140)
Potassium: 4 mEq/L (ref 3.5–5.1)
SODIUM: 139 meq/L (ref 136–145)
TOTAL PROTEIN: 9 g/dL — AB (ref 6.4–8.3)
Total Bilirubin: <0.3 mg/dL (ref 0.20–1.20)

## 2015-09-07 LAB — CBC & DIFF AND RETIC
BASO%: 0.6 % (ref 0.0–2.0)
Basophils Absolute: 0 10*3/uL (ref 0.0–0.1)
EOS%: 1.3 % (ref 0.0–7.0)
Eosinophils Absolute: 0.1 10*3/uL (ref 0.0–0.5)
HCT: 29.8 % — ABNORMAL LOW (ref 34.8–46.6)
HGB: 9.6 g/dL — ABNORMAL LOW (ref 11.6–15.9)
Immature Retic Fract: 18.4 % — ABNORMAL HIGH (ref 1.60–10.00)
LYMPH%: 29 % (ref 14.0–49.7)
MCH: 31.8 pg (ref 25.1–34.0)
MCHC: 32.2 g/dL (ref 31.5–36.0)
MCV: 98.7 fL (ref 79.5–101.0)
MONO#: 0.4 10*3/uL (ref 0.1–0.9)
MONO%: 7.2 % (ref 0.0–14.0)
NEUT%: 61.9 % (ref 38.4–76.8)
NEUTROS ABS: 3.3 10*3/uL (ref 1.5–6.5)
Platelets: 345 10*3/uL (ref 145–400)
RBC: 3.02 10*6/uL — AB (ref 3.70–5.45)
RDW: 15.3 % — ABNORMAL HIGH (ref 11.2–14.5)
Retic %: 1.35 % (ref 0.70–2.10)
Retic Ct Abs: 40.77 10*3/uL (ref 33.70–90.70)
WBC: 5.3 10*3/uL (ref 3.9–10.3)
lymph#: 1.5 10*3/uL (ref 0.9–3.3)

## 2015-09-07 LAB — MORPHOLOGY: PLT EST: ADEQUATE

## 2015-09-07 LAB — URINALYSIS, MICROSCOPIC - CHCC
Bilirubin (Urine): NEGATIVE
Glucose: NEGATIVE mg/dL
KETONES: NEGATIVE mg/dL
Leukocyte Esterase: NEGATIVE
Nitrite: NEGATIVE
PROTEIN: NEGATIVE mg/dL
SPECIFIC GRAVITY, URINE: 1.01 (ref 1.003–1.035)
Urobilinogen, UR: 0.2 mg/dL (ref 0.2–1)
pH: 6.5 (ref 4.6–8.0)

## 2015-09-07 LAB — IRON AND TIBC
%SAT: 17 % — AB (ref 21–57)
IRON: 30 ug/dL — AB (ref 41–142)
TIBC: 176 ug/dL — ABNORMAL LOW (ref 236–444)
UIBC: 146 ug/dL (ref 120–384)

## 2015-09-07 MED ORDER — HEPARIN SOD (PORK) LOCK FLUSH 100 UNIT/ML IV SOLN
500.0000 [IU] | Freq: Once | INTRAVENOUS | Status: AC
  Administered 2015-09-07: 500 [IU] via INTRAVENOUS
  Filled 2015-09-07: qty 5

## 2015-09-07 MED ORDER — SODIUM CHLORIDE 0.9% FLUSH
10.0000 mL | INTRAVENOUS | Status: DC | PRN
  Administered 2015-09-07: 10 mL via INTRAVENOUS
  Filled 2015-09-07: qty 10

## 2015-09-07 NOTE — Patient Instructions (Signed)

## 2015-09-07 NOTE — Progress Notes (Signed)
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 I saw her last in 03-2015, hemoglobin then 13.1, MCV 104.    She had 6 cycles of adjuvant chemotherapy with carboplatin taxol thru 10-21-2014, then vaginal brachytherapy completed 01-2015. She saw Dr Hazle Nordmann 04-25-2015 (note in [facility]), Dr Sondra Come 08-10-15, and is to continue alternating visits with those MDs every 3 months. Last CT AP was in [facility] system 12-25-14. She has no known recurrent gyn cancer, with last pelvic exam by Dr Sondra Come with no pelvic mass appreciated.   Anemia: hemoglobin ranged from 8.1 - 12.6 thru chemo, with MCV 103 when I met her after first chemo and up to 109 during treatment. Iron studies 08-2014 had serum iron 43, %sat 22, ferritin 429, B12 >2000 SIEP 10-2014 had no monoclonal protein (elevated IgG and IgA polyclonal). Urinalyses had microscopic hematuria, with cystoscopy by Dr Burman Nieves at [facility] 10-20-2014 unremarkable. Haptoglobin 08-2014 not low (449), LDH normal, total bili occasionally elevated (2-29-26) but often normal. She was transfused 2 units PRBCs 09-2014 for hemoglobin 8.1.  Colonoscopy by Dr Oretha Caprice [facility] 05-23-15 unable to advance pediatric scope beyond sigmoid colon, which seemed to be fixed. I do not see further GI visits in this EMR.  Labs from PCP/ [facility] 08-21-15   WBC 4.8, Hgb 9.6, plt 392, MCV 99.7 07-25-15   WBC 4.9, Hgb 10.4, plt 370, MCV 103, B12 480, folate >23, ferritin 296 04-26-15  WBC 3.3, Hgb 13.3, plt  225, MCV 106.9 03-23-14  WBC 8.3, hgb 7.8, plt 516, MCV 91 02-2014   FOB negative   Patient complains of poor energy x a few months. She denies any pain, SOB, other respiratory problems. She denies abdominal or pelvic discomfort. Bowels move daily, no change, and bladder ok without gross hematuria. Appetite has been reasonable. She is trying to eat more salt due to mild hypotension, which was noted thru all of her treatment at this office also. Only noted bleeding has been 2 brief episodes of epistaxis in last couple of weeks, which needed one tissue only to resolve.  SInusitis ~ 6 weeks ago, resolved. No other infectious illness recently. NOTE patient mentions that she has had no alcohol x 3 weeks. She does have some substance abuse history, with elevated MCV previously thought related to alcohol and active chemo. She has not been on oral iron recently. Zoloft was recently increased by PCP. She has history of anxiety/ depression concerns.  PAC flushed today (09-10-15) Flu vaccine to be done 03-27-15 Overdue mammograms which we will schedule No genetics testing  ONCOLOGIC HISTORY Patient had one episode of post menopausal vaginal bleeding early 2015. Later in 2015 she had persistent vaginal discharge, for which she was seen by PCP Suzann Hedgecock in ~ 02-2014, had CTs in [facility] and was referred to Dr Adella Nissen, gyn in [facility]. Endometrial biopsy had concern (that path not included in information sent for consultation) such that she was referred to Dr Jacquelyne Balint. Surgery by Dr Sabra Heck at [facility] in [place]  on 04-06-14 was TAH, BSO, omentectomy, pelvic and right common iliac node evaluation; patient was hospitalized x 3 days and tells me that she recovered well from the surgery. Pathology [id number]) from 04-06-14 found high grade serous carcinoma involving entire thickness of myometrium (depth of invasion 1.1 cm out of 1.1 cm), with invasion of cervical stroma, no involvement of uterine  serosa/bilateral tubes and ovaries/omentum, positive LVSI, 2/5 right pelvic nodes, 2 of 4 left pelvic nodes and 0/1 right common iliac node. Bulk of the carcinoma was in lower uterine segment where it was within 0.1 cm of serosal surface. Cytology positive for adenocarcinoma on washings ([id number]). Surgical findings were significant for no paraaortic adenopathy apparent. Patient has received 3 cycles of adjuvant chemotherapy in [facility] by Dr Sabra Heck, on 05-05-14, 05-26-14 and 07-29-14. Per records and patient, delay of cycle 3 was related to low counts and insurance changes. She received neulasta after chemotherapy on 07-29-14. Taxol was given at 175 mg/m2 for total dose was 280 mg cycle 1 and 291 mg for cycles 2 and 3; carboplatin was AUC = 6 with total dose 610 mg cycle 1, 620 mg cycle 2 and 630 mg cycle 3.. She does not recall using oral decadron premed for taxol, with premeds listed on chemo flowsheets standard decadron 20 mg, zofran 16 mg, benadryl 50 mg (which caused severe restless legs) and pepcid 20 mg. Outside information does not include serial blood counts. Last note from Dr Sabra Heck dated 07-29-14 describes unremarkable exam, "NED during chemotherapy and adequate PS". Patient was see by Dr Denman George 08-09-14, who recommends restaging scans after 6 cycles of chemotherapy and consideration of vaginal brachytherapy; she has not been seen by radiation oncology in [place]. CT AP done in [facility] system 08-15-14 (due to elevated LFTs and blood in urine just after transfer of care) with soft tissue fullness at superior abdominal aorta and question of retroperitoneal necrotic adenopathy; plan repeat scans after complete present chemotherapy. 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 CT AP 12-25-14 without adenopathy, pelvic concerns or bowel findings other than large stool, no  bone lesions, tiny bilateral renal cysts. Radiation treatment dates: July 19, July 27, July 29, August 9, August 11 Site/dose: Proximal vagina, 30 gray in 5 fractions   Objective:  Vital signs in last 24 hours:  BP 108/73 mmHg  Pulse 76  Temp(Src) 98.2 F (36.8 C) (Oral)  Resp 18  Ht 5' 6.5" (1.689 m)  Wt 143 lb 4.8 oz (65 kg)  BMI 22.79 kg/m2  SpO2 100% Weight down 2 lbs from 03-2015 Alert, oriented and appropriate. Ambulatory without difficulty, does not appear uncomfortable. Respirations not labored RA.  HEENT:PERRL, sclerae not icteric. Oral mucosa moist without lesions, posterior pharynx clear. Mucous membranes somewhat pale.  Neck supple. No JVD.  Lymphatics:no cervical,supraclavicular, axillary or inguinal adenopathy Resp: clear to auscultation bilaterally and normal percussion bilaterally Cardio: regular rate and rhythm. No gallop. GI: soft, nontender, not distended, no mass or organomegaly. Normally active bowel sounds. Surgical incision not remarkable. Musculoskeletal/ Extremities: without pitting edema, cords, tenderness Neuro: speech fluent, otherwise nonfocal. PSYCH appropriate mood and affect Skin without rash, ecchymosis, petechiae Portacath-without erythema or tenderness  Lab Results:  Results for orders placed or performed in visit on 09/07/15  Comprehensive metabolic panel  Result Value Ref Range   Sodium 139 136 - 145 mEq/L   Potassium 4.0 3.5 -  5.1 mEq/L   Chloride 105 98 - 109 mEq/L   CO2 28 22 - 29 mEq/L   Glucose 83 70 - 140 mg/dl   BUN 14.4 7.0 - 26.0 mg/dL   Creatinine 0.8 0.6 - 1.1 mg/dL   Total Bilirubin <0.30 0.20 - 1.20 mg/dL   Alkaline Phosphatase 118 40 - 150 U/L   AST 24 5 - 34 U/L   ALT 34 0 - 55 U/L   Total Protein 9.0 (H) 6.4 - 8.3 g/dL   Albumin 2.6 (L) 3.5 - 5.0 g/dL   Calcium 9.7 8.4 - 10.4 mg/dL   Anion Gap 6 3 - 11 mEq/L   EGFR 87 (L) >90 ml/min/1.73 m2  Iron and TIBC  Result Value Ref Range   Iron 30 (L) 41 - 142  ug/dL   TIBC 176 (L) 236 - 444 ug/dL   UIBC 146 120 - 384 ug/dL   %SAT 17 (L) 21 - 57 %  Urinalysis with microscopic  Result Value Ref Range   Glucose Negative Negative mg/dL   Bilirubin (Urine) Negative Negative   Ketones Negative Negative mg/dL   Specific Gravity, Urine 1.010 1.003 - 1.035   Blood Moderate Negative   pH 6.5 4.6 - 8.0   Protein Negative Negative- <30 mg/dL   Urobilinogen, UR 0.2 0.2 - 1 mg/dL   Nitrite Negative Negative   Leukocyte Esterase Negative Negative   RBC / HPF 0-2 0 - 2   WBC, UA 0-2 0 - 2   Epithelial Cells Occasional Negative- Few  Morphology  Result Value Ref Range   Polychromasia Slight Slight   RBC Comments Rouleaux Present Within Normal Limits   White Cell Comments C/W auto diff    PLT EST Adequate Adequate   Platelet Morphology Large Platelets Within Normal Limits  CBC & Diff and Retic  Result Value Ref Range   WBC 5.3 3.9 - 10.3 10e3/uL   NEUT# 3.3 1.5 - 6.5 10e3/uL   HGB 9.6 (L) 11.6 - 15.9 g/dL   HCT 29.8 (L) 34.8 - 46.6 %   Platelets 345 145 - 400 10e3/uL   MCV 98.7 79.5 - 101.0 fL   MCH 31.8 25.1 - 34.0 pg   MCHC 32.2 31.5 - 36.0 g/dL   RBC 3.02 (L) 3.70 - 5.45 10e6/uL   RDW 15.3 (H) 11.2 - 14.5 %   lymph# 1.5 0.9 - 3.3 10e3/uL   MONO# 0.4 0.1 - 0.9 10e3/uL   Eosinophils Absolute 0.1 0.0 - 0.5 10e3/uL   Basophils Absolute 0.0 0.0 - 0.1 10e3/uL   NEUT% 61.9 38.4 - 76.8 %   LYMPH% 29.0 14.0 - 49.7 %   MONO% 7.2 0.0 - 14.0 %   EOS% 1.3 0.0 - 7.0 %   BASO% 0.6 0.0 - 2.0 %   Retic % 1.35 0.70 - 2.10 %   Retic Ct Abs 40.77 33.70 - 90.70 10e3/uL   Immature Retic Fract 18.40 (H) 1.60 - 10.00 %   hemoccult cards, SIEP, 24 hr urine for UPEP/ TP pending.   Studies/Results:  Colonoscopy report reviewed in EMR. She had extensive surgery for the gyn cancer, but only vaginal brachytherapy as radiation.  Medications: I have reviewed the patient's current medications. She should take oral iron on empty stomach with  OJ.  DISCUSSION Endometrial carcinoma high risk for recurrence tho nothing clearly of concern for that post surgery, adjuvant chemotherapy and vaginal brachytherapy all completed 01-2015.  Last imaging was CT AP 12-2014, nothing obvious for findings at  sigmoid colon at colonoscopy 05-2015.  She has had variable hemoglobin and other indices since I met her 07-2014, presently down from 13 in 03-2015 to 9.6 today. Etiology of rouleaux not clear, last SIEP done because of elevated protein had no monoclonal protein, but will recheck that + 24 hour urine now. She is again iron deficient, will resume oral iron on empty stomach with OJ to improve absorption. Colonoscopy could not be done past sigmoid. Follow up these labs + hemoccults pending.  Consider repeat CT AP due to colonoscopy findings.  Counseled her to refrain from alcohol, as that was most likely cause of elevated MCV. Has not had bone marrow exam, consider if appropriate after rest of information results.  I will see her again in ~ 3 weeks.   Assessment/Plan:  1.IIIC1 high grade serous endometrial carcinoma: Post optimal debulking by Dr Jacquelyne Balint 04-06-14 and 3 cycles of adjuvant taxol carboplatin from 05-05-14 thru 07-29-13. Question of retroperitoneal and para-aortic adenopathy by CT 08-15-14. 3 additional cycles of chemotherapy given in [place] from 08-24-14 thru 10-21-14, last with [place] only due to taxol neuropathy. No known active disease now, tho she is at high risk. Vaginal brachytherapy completed 01-2015. Dr Sabra Heck following q 3 months.  2.variable anemia which has seemed multifactorial to this point, with chemo, iron deficiency, ETOH. More anemic now, evaluation in progress. .No monoclonal protein on SPEP/ IFE 10-2014. Microhematuria: urology evaluation including cystoscopy not remarkable    3.Colonoscopy could not evaluate beyond sigmoid, with colon fixed there. ? Related to surgical adhesions, not clear to me that this would be from  vaginal brachytherapy. 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 6.PAC in: needs flush every 6-8 weeks if not used otherwise, done at this office for now 7.long tobacco recently [facility]. Stable pulmonary nodules on CT. 9.regular use of marijuana and ETOH, has recently stopped ETOH 10.history anxiety and depression followed by PCP 11.mammograms 04-11-15: heterogeneously dense tissue otherwise no findings of concern 12. Restless legs with benadryl 13. 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 Ascending thoracic aortic aneurysm reported on CT chest 11-04-14. This information to be sent to patient's PCP. I was not aware of these findings until after visit and did not discuss with her today. 16. 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 Time spent 30 min including >50% counseling and coordination of care. Cc Drs Almedia Balls, Verner Chol, MD   09/07/2015, 5:50 PM

## 2015-09-07 NOTE — Telephone Encounter (Signed)
appt made and avs printed °

## 2015-09-10 DIAGNOSIS — D500 Iron deficiency anemia secondary to blood loss (chronic): Secondary | ICD-10-CM | POA: Insufficient documentation

## 2015-09-10 DIAGNOSIS — Z95828 Presence of other vascular implants and grafts: Secondary | ICD-10-CM | POA: Insufficient documentation

## 2015-09-10 DIAGNOSIS — C541 Malignant neoplasm of endometrium: Secondary | ICD-10-CM | POA: Insufficient documentation

## 2015-09-11 LAB — PROTEIN ELECTROPHORESIS, SERUM, WITH REFLEX
A/G RATIO SPE: 0.5 — AB (ref 0.7–1.7)
ALBUMIN: 2.6 g/dL — AB (ref 2.9–4.4)
Alpha 1: 0.4 g/dL (ref 0.0–0.4)
Alpha 2: 1.1 g/dL — ABNORMAL HIGH (ref 0.4–1.0)
Beta: 1.4 g/dL — ABNORMAL HIGH (ref 0.7–1.3)
GLOBULIN, TOTAL: 5.3 g/dL — AB (ref 2.2–3.9)
Gamma Globulin: 2.3 g/dL — ABNORMAL HIGH (ref 0.4–1.8)
TOTAL PROTEIN: 7.9 g/dL (ref 6.0–8.5)

## 2015-09-13 LAB — UPEP/TP, 24-HR URINE
ALPHA-1-GLOBULIN, U: 0 %
Albumin, U: 100 %
Alpha-2-Globulin, U: 0 %
Beta Globulin, U: 0 %
Gamma Globulin, U: 0 %
Prot,24hr calculated: <118 mg/24 hr (ref 30.0–150.0)

## 2015-09-14 ENCOUNTER — Ambulatory Visit (HOSPITAL_BASED_OUTPATIENT_CLINIC_OR_DEPARTMENT_OTHER): Payer: Medicaid Other

## 2015-09-14 DIAGNOSIS — D500 Iron deficiency anemia secondary to blood loss (chronic): Secondary | ICD-10-CM

## 2015-09-14 LAB — FECAL OCCULT BLOOD, GUAIAC: OCCULT BLOOD: NEGATIVE

## 2015-09-28 ENCOUNTER — Ambulatory Visit: Payer: Medicaid Other | Admitting: Oncology

## 2015-09-28 ENCOUNTER — Other Ambulatory Visit: Payer: Medicaid Other

## 2015-09-28 ENCOUNTER — Other Ambulatory Visit: Payer: Self-pay

## 2015-09-28 DIAGNOSIS — C541 Malignant neoplasm of endometrium: Secondary | ICD-10-CM

## 2015-09-30 ENCOUNTER — Other Ambulatory Visit: Payer: Self-pay | Admitting: Oncology

## 2015-09-30 DIAGNOSIS — C541 Malignant neoplasm of endometrium: Secondary | ICD-10-CM

## 2015-10-02 ENCOUNTER — Other Ambulatory Visit (HOSPITAL_BASED_OUTPATIENT_CLINIC_OR_DEPARTMENT_OTHER): Payer: Medicaid Other

## 2015-10-02 ENCOUNTER — Encounter: Payer: Self-pay | Admitting: Oncology

## 2015-10-02 ENCOUNTER — Ambulatory Visit (HOSPITAL_BASED_OUTPATIENT_CLINIC_OR_DEPARTMENT_OTHER): Payer: Medicaid Other | Admitting: Oncology

## 2015-10-02 ENCOUNTER — Telehealth: Payer: Self-pay | Admitting: Oncology

## 2015-10-02 ENCOUNTER — Ambulatory Visit: Payer: Medicaid Other

## 2015-10-02 VITALS — BP 93/63 | HR 81 | Temp 98.4°F | Resp 18 | Ht 66.5 in | Wt 141.6 lb

## 2015-10-02 DIAGNOSIS — G620 Drug-induced polyneuropathy: Secondary | ICD-10-CM

## 2015-10-02 DIAGNOSIS — G2581 Restless legs syndrome: Secondary | ICD-10-CM

## 2015-10-02 DIAGNOSIS — I712 Thoracic aortic aneurysm, without rupture: Secondary | ICD-10-CM

## 2015-10-02 DIAGNOSIS — I700 Atherosclerosis of aorta: Secondary | ICD-10-CM | POA: Diagnosis not present

## 2015-10-02 DIAGNOSIS — Z95828 Presence of other vascular implants and grafts: Secondary | ICD-10-CM

## 2015-10-02 DIAGNOSIS — M479 Spondylosis, unspecified: Secondary | ICD-10-CM

## 2015-10-02 DIAGNOSIS — F1290 Cannabis use, unspecified, uncomplicated: Secondary | ICD-10-CM | POA: Diagnosis not present

## 2015-10-02 DIAGNOSIS — C541 Malignant neoplasm of endometrium: Secondary | ICD-10-CM

## 2015-10-02 DIAGNOSIS — M160 Bilateral primary osteoarthritis of hip: Secondary | ICD-10-CM

## 2015-10-02 DIAGNOSIS — D509 Iron deficiency anemia, unspecified: Secondary | ICD-10-CM

## 2015-10-02 LAB — CBC & DIFF AND RETIC
BASO%: 0.6 % (ref 0.0–2.0)
Basophils Absolute: 0 10*3/uL (ref 0.0–0.1)
EOS ABS: 0.1 10*3/uL (ref 0.0–0.5)
EOS%: 2.1 % (ref 0.0–7.0)
HCT: 30.6 % — ABNORMAL LOW (ref 34.8–46.6)
HGB: 9.8 g/dL — ABNORMAL LOW (ref 11.6–15.9)
IMMATURE RETIC FRACT: 14.4 % — AB (ref 1.60–10.00)
LYMPH#: 1.7 10*3/uL (ref 0.9–3.3)
LYMPH%: 36.1 % (ref 14.0–49.7)
MCH: 31.1 pg (ref 25.1–34.0)
MCHC: 32 g/dL (ref 31.5–36.0)
MCV: 97.1 fL (ref 79.5–101.0)
MONO#: 0.4 10*3/uL (ref 0.1–0.9)
MONO%: 9.2 % (ref 0.0–14.0)
NEUT%: 52 % (ref 38.4–76.8)
NEUTROS ABS: 2.5 10*3/uL (ref 1.5–6.5)
NRBC: 0 % (ref 0–0)
Platelets: 313 10*3/uL (ref 145–400)
RBC: 3.15 10*6/uL — AB (ref 3.70–5.45)
RDW: 16.5 % — AB (ref 11.2–14.5)
Retic %: 1.36 % (ref 0.70–2.10)
Retic Ct Abs: 42.84 10*3/uL (ref 33.70–90.70)
WBC: 4.8 10*3/uL (ref 3.9–10.3)

## 2015-10-02 LAB — COMPREHENSIVE METABOLIC PANEL
ALT: 12 U/L (ref 0–55)
ANION GAP: 9 meq/L (ref 3–11)
AST: 15 U/L (ref 5–34)
Albumin: 2.6 g/dL — ABNORMAL LOW (ref 3.5–5.0)
Alkaline Phosphatase: 91 U/L (ref 40–150)
BUN: 9.1 mg/dL (ref 7.0–26.0)
CALCIUM: 9.5 mg/dL (ref 8.4–10.4)
CHLORIDE: 103 meq/L (ref 98–109)
CO2: 27 mEq/L (ref 22–29)
CREATININE: 0.9 mg/dL (ref 0.6–1.1)
EGFR: 80 mL/min/{1.73_m2} — AB (ref >90)
Glucose: 108 mg/dl (ref 70–140)
POTASSIUM: 3.9 meq/L (ref 3.5–5.1)
Sodium: 139 mEq/L (ref 136–145)
Total Bilirubin: <0.3 mg/dL (ref 0.20–1.20)
Total Protein: 8.9 g/dL — ABNORMAL HIGH (ref 6.4–8.3)

## 2015-10-02 LAB — MORPHOLOGY: PLT EST: ADEQUATE

## 2015-10-02 MED ORDER — HEPARIN SOD (PORK) LOCK FLUSH 100 UNIT/ML IV SOLN
500.0000 [IU] | Freq: Once | INTRAVENOUS | Status: AC
  Administered 2015-10-02: 500 [IU] via INTRAVENOUS
  Filled 2015-10-02: qty 5

## 2015-10-02 MED ORDER — SODIUM CHLORIDE 0.9% FLUSH
10.0000 mL | INTRAVENOUS | Status: DC | PRN
  Administered 2015-10-02: 10 mL via INTRAVENOUS
  Filled 2015-10-02: qty 10

## 2015-10-02 NOTE — Patient Instructions (Signed)

## 2015-10-02 NOTE — Telephone Encounter (Signed)
appt made and schedule calendar will be sent by mail for pt

## 2015-10-02 NOTE — Progress Notes (Signed)
OFFICE PROGRESS NOTE   October 02, 2015   Physicians: Everitt Amber, Jacquelyne Balint, Nunzio Cobbs Hedgecock(PCP, [facility] [employer]), _ [name] [facility]); Louis Meckel ([facility]), [name] , Owens Loffler ([facility])  INTERVAL HISTORY:   Patient is seen, alone for visit, in follow up of IIIC high grade serous endometrial carcinoma for which she is on observation since completing adjuvant chemotherapy 10-21-2014, and recent worsening of anemia for which she had additional lab evaluation at visit 09-07-15.  This visit was rescheduled from 09-18-15 at patient's request.  She saw Dr Sondra Come in 07-2015, is to see Dr Sabra Heck in 10-2015 and Dr Sondra Come again ~ 01-2016.  Anemia labs from 08-2015: FOB negative x3 RBC rouleaux called 24 hour urine:  no M spike, protein <118 mg/24 hrs SPEP:  No M spike, pattern of inflammatory response UA  Moderate blood Serum iron 30,  %sat 17     Patient reports that she has felt better since change in antidepressant to Lexapro by PCP a week ago. She is now taking oral iron on empty stomach with OJ to improve absorption. She had 2 episodes of epistaxis in past ~ 10 days, no other bleeding. She denies increased SOB, orthostatic symptoms, chest pain. Appetite is better, bowels moving regularly, no abdominal or pelvic pain, no LE swelling, no fever or symptoms of infection. No problems with PAC. As previously, she is surprised that beer and wine count as alcohol. Remainder of 10 point Review of Systems negative/ unchanged.   She took 2 young nieces to [place] for a week since she was here last, stayed with daughter.   PAC flushed 10-02-15 Flu vaccine to be done 03-27-15 Overdue mammograms which we will schedule No genetics testing  ONCOLOGIC HISTORY  Patient had one episode of post menopausal vaginal bleeding early 2015. Later in 2015 she had persistent vaginal discharge, for which she was seen by PCP Suzann Hedgecock in  ~ 02-2014, had CTs in [facility] and was referred to Dr Adella Nissen, gyn in [facility]. Endometrial biopsy had concern (that path not included in information sent for consultation) such that she was referred to Dr Jacquelyne Balint. Surgery by Dr Sabra Heck at [facility] in [place] on 04-06-14 was TAH, BSO, omentectomy, pelvic and right common iliac node evaluation; patient was hospitalized x 3 days and tells me that she recovered well from the surgery. Pathology [phone]) from 04-06-14 found high grade serous carcinoma involving entire thickness of myometrium (depth of invasion 1.1 cm out of 1.1 cm), with invasion of cervical stroma, no involvement of uterine serosa/bilateral tubes and ovaries/omentum, positive LVSI, 2/5 right pelvic nodes, 2 of 4 left pelvic nodes and 0/1 right common iliac node. Bulk of the carcinoma was in lower uterine segment where it was within 0.1 cm of serosal surface. Cytology positive for adenocarcinoma on washings ([id number]). Surgical findings were significant for no paraaortic adenopathy apparent. Patient has received 3 cycles of adjuvant chemotherapy in [facility] by Dr Sabra Heck, on 05-05-14, 05-26-14 and 07-29-14. Per records and patient, delay of cycle 3 was related to low counts and insurance changes. She received neulasta after chemotherapy on 07-29-14. Taxol was given at 175 mg/m2 for total dose was 280 mg cycle 1 and 291 mg for cycles 2 and 3; carboplatin was AUC = 6 with total dose 610 mg cycle 1, 620 mg cycle 2 and 630 mg cycle 3.. She does not recall using oral decadron premed for taxol, with premeds listed on chemo  flowsheets standard decadron 20 mg, zofran 16 mg, benadryl 50 mg (which caused severe restless legs) and pepcid 20 mg. Outside information does not include serial blood counts. Last note from Dr Sabra Heck dated 07-29-14 describes unremarkable exam, "NED during chemotherapy and adequate PS". Patient was see by Dr Denman George 08-09-14, who recommends restaging scans after 6 cycles of  chemotherapy and consideration of vaginal brachytherapy; she has not been seen by radiation oncology in [place]. CT AP done in [facility] system 08-15-14 (due to elevated LFTs and blood in urine just after transfer of care) with soft tissue fullness at superior abdominal aorta and question of retroperitoneal necrotic adenopathy; plan repeat scans after complete present chemotherapy. 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 CT AP 12-25-14 without adenopathy, pelvic concerns or bowel findings other than large stool, no bone lesions, tiny bilateral renal cysts. Radiation treatment dates: July 19, July 27, July 29, August 9, August 11 Site/dose: Proximal vagina, 30 gray in 5 fractions  Anemia: hemoglobin ranged from 8.1 - 12.6 thru chemo, with MCV 103 when I met her after first chemo and up to 109 during treatment. Iron studies 08-2014 had serum iron 43, %sat 22, ferritin 429, B12 >2000 SIEP 10-2014 had no monoclonal protein (elevated IgG and IgA polyclonal). Urinalyses had microscopic hematuria, with cystoscopy by Dr Burman Nieves at [facility] 10-20-2014 unremarkable. Haptoglobin 08-2014 not low (449), LDH normal, total bili occasionally elevated (2-29-26) but often normal. She was transfused 2 units PRBCs 09-2014 for hemoglobin 8.1.  Colonoscopy by Dr Oretha Caprice [facility] 05-23-15 unable to advance pediatric scope beyond sigmoid colon, which seemed to be fixed. I do not see further GI visits in this EMR.  Labs from PCP/ [facility] 08-21-15 WBC 4.8, Hgb 9.6, plt 392, MCV 99.7 07-25-15 WBC 4.9, Hgb 10.4, plt 370, MCV 103, B12 480, folate >23, ferritin 296 04-26-15 WBC 3.3, Hgb 13.3, plt 225, MCV 106.9 03-23-14 WBC 8.3, hgb 7.8, plt 516, MCV 91 02-2014 FOB negative  Anemia labs from 08-2015: FOB negative x3 RBC rouleaux called 24 hour urine:  no M spike,  protein <118 mg/24 hrs SPEP:  No M spike, pattern of inflammatory response UA  Moderate blood Serum iron 30,  %sat 17   Objective:  Vital signs in last 24 hours:  BP 93/63 mmHg  Pulse 81  Temp(Src) 98.4 F (36.9 C) (Oral)  Resp 18  Ht 5' 6.5" (1.689 m)  Wt 141 lb 9.6 oz (64.229 kg)  BMI 22.51 kg/m2  SpO2 100% Weight down 1.5 lbs Alert, oriented and appropriate. Ambulatory without difficulty. Respirations not labored. Looks comfortable.  HEENT:PERRL, sclerae not icteric. Oral mucosa moist without lesions, posterior pharynx clear.  Neck supple. No JVD.  Lymphatics:no cervical,supraclavicular, axillary or inguinal adenopathy Resp: clear to auscultation bilaterally and normal percussion bilaterally Cardio: regular rate and rhythm. No gallop. GI: soft, nontender, not distended, no mass or organomegaly. Normally active bowel sounds. Surgical incision not remarkable. Musculoskeletal/ Extremities: without pitting edema, cords, tenderness Neuro: nonfocal   PSYCH brighter mood and affect Skin without rash, ecchymosis, petechiae Portacath-without erythema or tenderness  Lab Results:  Results for orders placed or performed in visit on 10/02/15  Comprehensive metabolic panel  Result Value Ref Range   Sodium 139 136 - 145 mEq/L   Potassium 3.9 3.5 - 5.1 mEq/L   Chloride 103 98 - 109 mEq/L  CO2 27 22 - 29 mEq/L   Glucose 108 70 - 140 mg/dl   BUN 9.1 7.0 - 26.0 mg/dL   Creatinine 0.9 0.6 - 1.1 mg/dL   Total Bilirubin <0.30 0.20 - 1.20 mg/dL   Alkaline Phosphatase 91 40 - 150 U/L   AST 15 5 - 34 U/L   ALT 12 0 - 55 U/L   Total Protein 8.9 (H) 6.4 - 8.3 g/dL   Albumin 2.6 (L) 3.5 - 5.0 g/dL   Calcium 9.5 8.4 - 10.4 mg/dL   Anion Gap 9 3 - 11 mEq/L   EGFR 80 (L) >90 ml/min/1.73 m2  CBC & Diff and Retic  Result Value Ref Range   WBC 4.8 3.9 - 10.3 10e3/uL   NEUT# 2.5 1.5 - 6.5 10e3/uL   HGB 9.8 (L) 11.6 - 15.9 g/dL   HCT 30.6 (L) 34.8 - 46.6 %   Platelets 313 145 - 400  10e3/uL   MCV 97.1 79.5 - 101.0 fL   MCH 31.1 25.1 - 34.0 pg   MCHC 32.0 31.5 - 36.0 g/dL   RBC 3.15 (L) 3.70 - 5.45 10e6/uL   RDW 16.5 (H) 11.2 - 14.5 %   lymph# 1.7 0.9 - 3.3 10e3/uL   MONO# 0.4 0.1 - 0.9 10e3/uL   Eosinophils Absolute 0.1 0.0 - 0.5 10e3/uL   Basophils Absolute 0.0 0.0 - 0.1 10e3/uL   NEUT% 52.0 38.4 - 76.8 %   LYMPH% 36.1 14.0 - 49.7 %   MONO% 9.2 0.0 - 14.0 %   EOS% 2.1 0.0 - 7.0 %   BASO% 0.6 0.0 - 2.0 %   nRBC 0 0 - 0 %   Retic % 1.36 0.70 - 2.10 %   Retic Ct Abs 42.84 33.70 - 90.70 10e3/uL   Immature Retic Fract 14.40 (H) 1.60 - 10.00 %  Morphology  Result Value Ref Range   Polychromasia Slight Slight   RBC Comments Rouleaux Present Within Normal Limits   White Cell Comments C/W auto diff    PLT EST Adequate Adequate    Hgb up from 9.6 on 09-07-15  Studies/Results:  No results found.  Medications: I have reviewed the patient's current medications. Continue oral iron on empty stomach with OJ  DISCUSSION Lab information reviewed. Fortunately no new findings of concern, tho she is iron deficient, with some blood in urine (had negative cystoscopy by Dr Louis Meckel [phone] for this problem) and some epistaxis recently. Needs to continue oral iron with optimal administration (vs IV iron). May need follow up with urology.      Assessment/Plan: 1.IIIC1 high grade serous endometrial carcinoma: Post optimal debulking by Dr Jacquelyne Balint 04-06-14 and 3 cycles of adjuvant taxol carboplatin from 05-05-14 thru 07-29-13. Question of retroperitoneal and para-aortic adenopathy by CT 08-15-14. 3 additional cycles of chemotherapy given in [place] from 08-24-14 thru 10-21-14, last with [place] only due to taxol neuropathy. No known active disease now, tho she is at high risk. Vaginal brachytherapy completed 01-2015. Dr Sabra Heck following q 3 months.  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 Microhematuria: urology evaluation including cystoscopy not remarkable5-2016. FOB - x3 08-2015. Again iron deficient 08-2015. Continue oral iron now.  3.Colonoscopy could not evaluate beyond sigmoid, with colon fixed there. ? Related to surgical adhesions, not clear to me that this would be from vaginal brachytherapy. FOB negative x3 in 08-2015. 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 6.PAC in: needs flush every 6-8 weeks if not used otherwise, done at this office for now 7.long tobacco recently [facility]. Stable pulmonary nodules on CT. 9.regular use of marijuana and ETOH, may have recently stopped ETOH or at least decreased. 10.history anxiety and depression followed by PCP: recent change to lexapro seems better 11.mammograms 04-11-15: heterogeneously dense tissue otherwise no findings of concern 12. Restless legs with benadryl 13. 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 Ascending thoracic aortic aneurysm reported on CT chest 11-04-14. This information to be sent to patient's PCP. I was not aware of these findings until after visit and did not discuss with her today. 16. 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 Will repeat labs with PAC flush 6 wks, with Dr Clabe Seal visit 01-2016 and I will see her back in fall, or sooner if needed. Time spent 25 min including >50% counseling and coordination of care. Route to PCP, cc Drs Sabra Heck, Silverio Lay, MD   10/02/2015, 5:05 PM

## 2015-11-14 ENCOUNTER — Ambulatory Visit: Payer: Medicaid Other

## 2015-11-14 ENCOUNTER — Other Ambulatory Visit (HOSPITAL_BASED_OUTPATIENT_CLINIC_OR_DEPARTMENT_OTHER): Payer: Medicaid Other

## 2015-11-14 VITALS — BP 118/79 | HR 88 | Temp 98.4°F | Resp 17

## 2015-11-14 DIAGNOSIS — C541 Malignant neoplasm of endometrium: Secondary | ICD-10-CM | POA: Diagnosis present

## 2015-11-14 DIAGNOSIS — D500 Iron deficiency anemia secondary to blood loss (chronic): Secondary | ICD-10-CM

## 2015-11-14 DIAGNOSIS — R232 Flushing: Secondary | ICD-10-CM

## 2015-11-14 LAB — CBC WITH DIFFERENTIAL/PLATELET
BASO%: 1.1 % (ref 0.0–2.0)
BASOS ABS: 0.1 10*3/uL (ref 0.0–0.1)
EOS%: 2.7 % (ref 0.0–7.0)
Eosinophils Absolute: 0.1 10*3/uL (ref 0.0–0.5)
HCT: 30.3 % — ABNORMAL LOW (ref 34.8–46.6)
HGB: 9.6 g/dL — ABNORMAL LOW (ref 11.6–15.9)
LYMPH%: 23.6 % (ref 14.0–49.7)
MCH: 30.2 pg (ref 25.1–34.0)
MCHC: 31.8 g/dL (ref 31.5–36.0)
MCV: 94.8 fL (ref 79.5–101.0)
MONO#: 0.4 10*3/uL (ref 0.1–0.9)
MONO%: 7.4 % (ref 0.0–14.0)
NEUT#: 3.2 10*3/uL (ref 1.5–6.5)
NEUT%: 65.2 % (ref 38.4–76.8)
Platelets: 359 10*3/uL (ref 145–400)
RBC: 3.2 10*6/uL — AB (ref 3.70–5.45)
RDW: 18.1 % — ABNORMAL HIGH (ref 11.2–14.5)
WBC: 4.9 10*3/uL (ref 3.9–10.3)
lymph#: 1.1 10*3/uL (ref 0.9–3.3)

## 2015-11-14 MED ORDER — HEPARIN SOD (PORK) LOCK FLUSH 100 UNIT/ML IV SOLN
500.0000 [IU] | Freq: Once | INTRAVENOUS | Status: AC
  Administered 2015-11-14: 500 [IU] via INTRAVENOUS
  Filled 2015-11-14: qty 5

## 2015-11-14 MED ORDER — SODIUM CHLORIDE 0.9% FLUSH
10.0000 mL | INTRAVENOUS | Status: DC | PRN
  Administered 2015-11-14: 10 mL via INTRAVENOUS
  Filled 2015-11-14: qty 10

## 2015-11-27 ENCOUNTER — Telehealth: Payer: Self-pay

## 2015-11-27 NOTE — Telephone Encounter (Signed)
Returned Ms. Miller's call. She states that she has been SOB with exertion and very tired for the last ~5 months.  She can hardly walk from one room to the next. She has seen [facility] in [place].several times and she cannot find out what is wrong with her. Her BP was low and she put her on a medication that is helping. Told Ms Bastardo that recent records from her PCP need to be obtained and reviewed by Dr. Marko Plume.  Will call her with Dr. Mariana Kaufman recommendations.  This process may take several days.   Ms Thousand verbalized understanding.

## 2015-11-28 NOTE — Telephone Encounter (Signed)
Obtained office notes from 09-21-15 and 11-21-15 from [facility] PA-C for Dr. Marko Plume to review upon her return to the office 11-30-15.

## 2015-11-30 ENCOUNTER — Emergency Department (HOSPITAL_COMMUNITY): Payer: Medicaid Other

## 2015-11-30 ENCOUNTER — Inpatient Hospital Stay (HOSPITAL_COMMUNITY): Payer: Medicaid Other

## 2015-11-30 ENCOUNTER — Encounter (HOSPITAL_COMMUNITY): Payer: Self-pay | Admitting: Emergency Medicine

## 2015-11-30 ENCOUNTER — Telehealth: Payer: Self-pay | Admitting: Oncology

## 2015-11-30 ENCOUNTER — Inpatient Hospital Stay (HOSPITAL_COMMUNITY)
Admission: EM | Admit: 2015-11-30 | Discharge: 2015-12-06 | DRG: 180 | Disposition: A | Discharge status: Home Health | Payer: Medicaid Other | Attending: Family Medicine | Admitting: Family Medicine

## 2015-11-30 ENCOUNTER — Other Ambulatory Visit: Payer: Self-pay | Admitting: Oncology

## 2015-11-30 DIAGNOSIS — R64 Cachexia: Secondary | ICD-10-CM

## 2015-11-30 DIAGNOSIS — F329 Major depressive disorder, single episode, unspecified: Secondary | ICD-10-CM | POA: Diagnosis present

## 2015-11-30 DIAGNOSIS — C7802 Secondary malignant neoplasm of left lung: Secondary | ICD-10-CM | POA: Diagnosis present

## 2015-11-30 DIAGNOSIS — R634 Abnormal weight loss: Secondary | ICD-10-CM

## 2015-11-30 DIAGNOSIS — Z90710 Acquired absence of both cervix and uterus: Secondary | ICD-10-CM

## 2015-11-30 DIAGNOSIS — F419 Anxiety disorder, unspecified: Secondary | ICD-10-CM | POA: Diagnosis present

## 2015-11-30 DIAGNOSIS — D509 Iron deficiency anemia, unspecified: Secondary | ICD-10-CM | POA: Diagnosis present

## 2015-11-30 DIAGNOSIS — R599 Enlarged lymph nodes, unspecified: Secondary | ICD-10-CM

## 2015-11-30 DIAGNOSIS — C541 Malignant neoplasm of endometrium: Secondary | ICD-10-CM

## 2015-11-30 DIAGNOSIS — J90 Pleural effusion, not elsewhere classified: Secondary | ICD-10-CM | POA: Diagnosis present

## 2015-11-30 DIAGNOSIS — Z79899 Other long term (current) drug therapy: Secondary | ICD-10-CM | POA: Diagnosis not present

## 2015-11-30 DIAGNOSIS — Z6823 Body mass index (BMI) 23.0-23.9, adult: Secondary | ICD-10-CM

## 2015-11-30 DIAGNOSIS — R911 Solitary pulmonary nodule: Secondary | ICD-10-CM | POA: Diagnosis not present

## 2015-11-30 DIAGNOSIS — Z9221 Personal history of antineoplastic chemotherapy: Secondary | ICD-10-CM

## 2015-11-30 DIAGNOSIS — Z8542 Personal history of malignant neoplasm of other parts of uterus: Secondary | ICD-10-CM | POA: Diagnosis not present

## 2015-11-30 DIAGNOSIS — I319 Disease of pericardium, unspecified: Secondary | ICD-10-CM | POA: Diagnosis not present

## 2015-11-30 DIAGNOSIS — Z87891 Personal history of nicotine dependence: Secondary | ICD-10-CM

## 2015-11-30 DIAGNOSIS — R0902 Hypoxemia: Secondary | ICD-10-CM

## 2015-11-30 DIAGNOSIS — J9601 Acute respiratory failure with hypoxia: Secondary | ICD-10-CM | POA: Diagnosis present

## 2015-11-30 DIAGNOSIS — Z923 Personal history of irradiation: Secondary | ICD-10-CM

## 2015-11-30 DIAGNOSIS — I712 Thoracic aortic aneurysm, without rupture: Secondary | ICD-10-CM | POA: Diagnosis present

## 2015-11-30 DIAGNOSIS — E440 Moderate protein-calorie malnutrition: Secondary | ICD-10-CM | POA: Diagnosis present

## 2015-11-30 DIAGNOSIS — J918 Pleural effusion in other conditions classified elsewhere: Secondary | ICD-10-CM

## 2015-11-30 DIAGNOSIS — Z8249 Family history of ischemic heart disease and other diseases of the circulatory system: Secondary | ICD-10-CM | POA: Diagnosis not present

## 2015-11-30 DIAGNOSIS — D7589 Other specified diseases of blood and blood-forming organs: Secondary | ICD-10-CM

## 2015-11-30 DIAGNOSIS — Z720 Tobacco use: Secondary | ICD-10-CM

## 2015-11-30 DIAGNOSIS — J910 Malignant pleural effusion: Secondary | ICD-10-CM | POA: Diagnosis present

## 2015-11-30 DIAGNOSIS — D649 Anemia, unspecified: Secondary | ICD-10-CM

## 2015-11-30 DIAGNOSIS — J449 Chronic obstructive pulmonary disease, unspecified: Secondary | ICD-10-CM | POA: Diagnosis present

## 2015-11-30 LAB — CBC
HCT: 30.7 % — ABNORMAL LOW (ref 36.0–46.0)
HEMOGLOBIN: 9.6 g/dL — AB (ref 12.0–15.0)
MCH: 29.4 pg (ref 26.0–34.0)
MCHC: 31.3 g/dL (ref 30.0–36.0)
MCV: 93.9 fL (ref 78.0–100.0)
Platelets: 443 10*3/uL — ABNORMAL HIGH (ref 150–400)
RBC: 3.27 MIL/uL — ABNORMAL LOW (ref 3.87–5.11)
RDW: 17.2 % — AB (ref 11.5–15.5)
WBC: 5.2 10*3/uL (ref 4.0–10.5)

## 2015-11-30 LAB — COMPREHENSIVE METABOLIC PANEL
ALK PHOS: 106 U/L (ref 38–126)
ALT: 24 U/L (ref 14–54)
ANION GAP: 8 (ref 5–15)
AST: 25 U/L (ref 15–41)
Albumin: 2.5 g/dL — ABNORMAL LOW (ref 3.5–5.0)
BUN: 11 mg/dL (ref 6–20)
CALCIUM: 8.6 mg/dL — AB (ref 8.9–10.3)
CO2: 25 mmol/L (ref 22–32)
Chloride: 103 mmol/L (ref 101–111)
Creatinine, Ser: 0.91 mg/dL (ref 0.44–1.00)
GFR calc non Af Amer: >60 mL/min (ref >60)
Glucose, Bld: 146 mg/dL — ABNORMAL HIGH (ref 65–99)
POTASSIUM: 3.6 mmol/L (ref 3.5–5.1)
SODIUM: 136 mmol/L (ref 135–145)
TOTAL PROTEIN: 8.4 g/dL — AB (ref 6.5–8.1)
Total Bilirubin: 0.4 mg/dL (ref 0.3–1.2)

## 2015-11-30 LAB — URINALYSIS W MICROSCOPIC (NOT AT ARMC)
Bilirubin Urine: NEGATIVE
Glucose, UA: NEGATIVE mg/dL
KETONES UR: NEGATIVE mg/dL
Nitrite: NEGATIVE
PROTEIN: 30 mg/dL — AB
Specific Gravity, Urine: >1.046 — ABNORMAL HIGH (ref 1.005–1.030)
pH: 5 (ref 5.0–8.0)

## 2015-11-30 LAB — LACTATE DEHYDROGENASE, PLEURAL OR PERITONEAL FLUID: LD FL: 445 U/L — AB (ref 3–23)

## 2015-11-30 LAB — TROPONIN I

## 2015-11-30 LAB — BODY FLUID CELL COUNT WITH DIFFERENTIAL
EOS FL: 0 %
Lymphs, Fluid: 15 %
Monocyte-Macrophage-Serous Fluid: 5 % — ABNORMAL LOW (ref 50–90)
NEUTROPHIL FLUID: 80 % — AB (ref 0–25)
WBC FLUID: 2497 uL — AB (ref 0–1000)

## 2015-11-30 LAB — BRAIN NATRIURETIC PEPTIDE: B Natriuretic Peptide: 19.4 pg/mL (ref 0.0–100.0)

## 2015-11-30 LAB — PROTEIN, TOTAL: Total Protein: 8.6 g/dL — ABNORMAL HIGH (ref 6.5–8.1)

## 2015-11-30 LAB — PROTEIN, BODY FLUID: Total protein, fluid: 5.9 g/dL

## 2015-11-30 LAB — PHOSPHORUS: Phosphorus: 2.5 mg/dL (ref 2.5–4.6)

## 2015-11-30 LAB — PROCALCITONIN: Procalcitonin: 0.31 ng/mL

## 2015-11-30 LAB — PROTIME-INR
INR: 1.31 (ref 0.00–1.49)
Prothrombin Time: 15.9 seconds — ABNORMAL HIGH (ref 11.6–15.2)

## 2015-11-30 LAB — LACTATE DEHYDROGENASE: LDH: 129 U/L (ref 98–192)

## 2015-11-30 LAB — MAGNESIUM: Magnesium: 1.9 mg/dL (ref 1.7–2.4)

## 2015-11-30 MED ORDER — FLUDROCORTISONE ACETATE 0.1 MG PO TABS
0.0500 mg | ORAL_TABLET | Freq: Every day | ORAL | Status: DC
  Administered 2015-11-30 – 2015-12-05 (×6): 0.05 mg via ORAL
  Filled 2015-11-30 (×8): qty 0.5

## 2015-11-30 MED ORDER — ADULT MULTIVITAMIN W/MINERALS CH
1.0000 | ORAL_TABLET | Freq: Every day | ORAL | Status: DC
  Administered 2015-11-30 – 2015-12-06 (×6): 1 via ORAL
  Filled 2015-11-30 (×13): qty 1

## 2015-11-30 MED ORDER — IOPAMIDOL (ISOVUE-300) INJECTION 61%
75.0000 mL | Freq: Once | INTRAVENOUS | Status: AC | PRN
  Administered 2015-11-30: 75 mL via INTRAVENOUS

## 2015-11-30 MED ORDER — TRAMADOL HCL 50 MG PO TABS
50.0000 mg | ORAL_TABLET | Freq: Four times a day (QID) | ORAL | Status: AC | PRN
  Administered 2015-12-04 – 2015-12-06 (×2): 50 mg via ORAL
  Filled 2015-11-30 (×2): qty 1

## 2015-11-30 MED ORDER — POLYETHYLENE GLYCOL 3350 17 G PO PACK
17.0000 g | PACK | Freq: Every day | ORAL | Status: DC | PRN
  Administered 2015-12-02 – 2015-12-06 (×4): 17 g via ORAL
  Filled 2015-11-30 (×4): qty 1

## 2015-11-30 MED ORDER — ACETAMINOPHEN 325 MG PO TABS
650.0000 mg | ORAL_TABLET | Freq: Four times a day (QID) | ORAL | Status: DC | PRN
  Administered 2015-11-30 – 2015-12-04 (×2): 650 mg via ORAL
  Filled 2015-11-30 (×2): qty 2

## 2015-11-30 MED ORDER — DOCUSATE SODIUM 100 MG PO CAPS
100.0000 mg | ORAL_CAPSULE | Freq: Two times a day (BID) | ORAL | Status: DC
  Administered 2015-11-30 – 2015-12-06 (×11): 100 mg via ORAL
  Filled 2015-11-30 (×12): qty 1

## 2015-11-30 MED ORDER — FERROUS SULFATE 325 (65 FE) MG PO TABS
325.0000 mg | ORAL_TABLET | Freq: Every day | ORAL | Status: DC
  Administered 2015-12-01 – 2015-12-06 (×5): 325 mg via ORAL
  Filled 2015-11-30 (×6): qty 1

## 2015-11-30 MED ORDER — MIRTAZAPINE 15 MG PO TABS
30.0000 mg | ORAL_TABLET | Freq: Every day | ORAL | Status: DC
  Filled 2015-11-30 (×2): qty 2

## 2015-11-30 MED ORDER — ENOXAPARIN SODIUM 40 MG/0.4ML ~~LOC~~ SOLN
40.0000 mg | SUBCUTANEOUS | Status: DC
Start: 2015-11-30 — End: 2015-12-03
  Administered 2015-11-30 – 2015-12-02 (×3): 40 mg via SUBCUTANEOUS
  Filled 2015-11-30 (×3): qty 0.4

## 2015-11-30 NOTE — ED Notes (Signed)
Called charge at 14:10 and she stated she would call back.

## 2015-11-30 NOTE — Telephone Encounter (Signed)
Spoke with patient to inform her of 7/13 appt date/time per LL 6/15 pof

## 2015-11-30 NOTE — Progress Notes (Signed)
Based on PT gender, age, and height- predicted average peak expiratory flow is approximately 435. Best of 3 attempts= 300.

## 2015-11-30 NOTE — ED Notes (Signed)
Patient c/o weakness, SOB x 4-5 months and gotten worse month ago when took PNA shot.  Patient states that she has intermittent dry cough. Patient called her PCP and was told she couldn't be seen until July so she came into ED to be seen.

## 2015-11-30 NOTE — Consult Note (Signed)
Name: Sheena Caldwell MRN: [id number] DOB: 1950-08-03    ADMISSION DATE:  11/30/2015 CONSULTATION DATE:  11/30/15  REFERRING MD :  Dr. 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    The patient reports she has been feeling tired and weak for approximately 6 months.  She has recently developed shortness of breath in the last two months has specifically worsened over the last two weeks.  The patient denies fevers, chills, weight loss, decreased appetite, hemoptysis, chest pain.  She reports infrequent dry, non-productive cough and occasional night sweats that she attributed to a new anti-depressant.  She is adamant that she feels she does not have depression.  She is tearful at times stating "I knew this was something more".  She has been using Boost to supplement her dietary intake.  She also has been using black seed oil for cancer.    Initial evaluation noted her to have room air saturations of 85%. She was placed on oxygen and saturations improved to 97%.  EKG showed sinus rhythm.  Initial labs - Na 136, K 3.6, Cl 103, sr cr 0.91, glucose 146, albumin 2.5, BNP 19.4, troponin <0.03, WBC 5.2, Hgb 9.6 and platelets 443.  Initial CXR showed a moderate to large left pleural effusion with associated atelectasis / infiltrate in the LLL, and a nodular density in the RLL (~1.3 cm).  This was followed up with a CT of the Chest which showed significant progression of disease with numerous pulmonary nodules (largest measuring 3.1x2.6 cm), new pleural based mass along the medial aspects of the LUL measuring 2.6x1.7, mediastinal and perihilar LAN, large left pleural effusion, emphysematous changes of the upper  lobes and no evidence of osseus metastases.    PCCM consulted for evaluation of pleural effusion, pulmonary nodules.    PAST MEDICAL HISTORY :   has a past medical history of Uterine cancer ([place]); Radiation (01/03/15, 01/11/15, 01/13/15, 01/24/15, 01/26/15); Arthritis; Blood transfusion without reported diagnosis; and COPD (chronic obstructive pulmonary disease) ([place]).  has past surgical history that includes Cesarean section (1991); Incise and drain abcess (1970); Portacath placement (04/26/2014); and Total abdominal hysterectomy w/ bilateral salpingoophorectomy (04/06/14).   Prior to Admission medications   Medication Sig Start Date End Date Taking? Authorizing Provider  albuterol (PROVENTIL HFA;VENTOLIN HFA) 108 (90 Base) MCG/ACT inhaler Inhale 1 puff into the lungs every 6 (six) hours as needed for wheezing or shortness of breath.   Yes Historical Provider, MD  Calcium Citrate-Vitamin D (CALCIUM + D PO) Take 1 tablet by mouth daily.   Yes Historical Provider, MD  cholecalciferol (VITAMIN D) 1000 units tablet Take 1,000 Units by mouth daily.   Yes Historical Provider, MD  ferrous sulfate 325 (65 FE) MG tablet Take 325 mg by mouth daily.   Yes Historical Provider, MD  fludrocortisone (FLORINEF) 0.1 MG tablet Take 0.05 mg by mouth daily.   Yes Historical Provider, MD  mirtazapine (REMERON) 30 MG tablet Take 30 mg by mouth at bedtime.   Yes Historical Provider, MD  Multiple Vitamin (MULTI-VITAMINS) TABS Take 1 tablet by mouth daily.   Yes Historical Provider, MD  polyethylene glycol (MIRALAX / GLYCOLAX) packet Take 17 g by mouth daily as needed.   Yes  Historical Provider, MD  UNABLE TO FIND Med Name: smooth move drink for constipation   Yes Historical Provider, MD  docusate sodium (COLACE) 100 MG capsule Take 1 capsule (100 mg total) by mouth every 12 (twelve) hours. 12/25/14   Noemi Chapel, MD   Allergies  Allergen Reactions  . Levaquin [Levofloxacin In D5w] Other (See Comments)    "I just felt  horrible"    FAMILY HISTORY:  family history includes Cancer in her father; Hypertension in her mother. There is no history of Colon cancer, Esophageal cancer, Rectal cancer, or Stomach cancer.   SOCIAL HISTORY:  reports that she quit smoking about 2 years ago. Her smoking use included Cigarettes. She has a 30 pack-year smoking history. She has never used smokeless tobacco. She reports that she drinks about 1.2 - 1.8 oz of alcohol per week. She reports that she uses illicit drugs (Marijuana).  REVIEW OF SYSTEMS:  POSITIVES IN BOLD Constitutional: Negative for fever, chills, weight loss, malaise/fatigue and diaphoresis.  HENT: Negative for hearing loss, ear pain, nosebleeds, congestion, sore throat, neck pain, tinnitus and ear discharge.   Eyes: Negative for blurred vision, double vision, photophobia, pain, discharge and redness.  Respiratory: Negative for cough, hemoptysis, sputum production, shortness of breath, wheezing and stridor.   Cardiovascular: Negative for chest pain, palpitations, orthopnea, claudication, leg swelling and PND.  Gastrointestinal: Negative for heartburn, nausea, vomiting, abdominal pain, diarrhea, constipation, blood in stool and melena.  Genitourinary: Negative for dysuria, urgency, frequency, hematuria and flank pain.  Musculoskeletal: Negative for myalgias, back pain, joint pain and falls.  Skin: Negative for itching and rash.  Neurological: Negative for dizziness, tingling, tremors, sensory change, speech change, focal weakness, seizures, loss of consciousness, weakness and headaches.  Endo/Heme/Allergies: Negative for environmental allergies and polydipsia. Does not bruise/bleed easily.  SUBJECTIVE:   VITAL SIGNS: Temp:  [97.9 F (36.6 C)] 97.9 F (36.6 C) (06/15 1006) Pulse Rate:  [106] 106 (06/15 1006) Resp:  [24] 24 (06/15 1006) BP: (105)/(66) 105/66 mmHg (06/15 1006) SpO2:  [86 %-93 %] 93 % (06/15 1009)  PHYSICAL EXAMINATION: General:  Thin,  chronically ill appearing female in NAD  Neuro:  AAOx4, speech clear, MAE  HEENT:  MM pink/moist, no JVD, can see port crossing over right clavicle, no LAN Cardiovascular:  S1S2 rrr, no m/r/g  Lungs:  Even/non-labored, diminished on L, clear on R.  Dullness to percussion 2/3 way up on left  Abdomen:  Soft, non-distended Musculoskeletal:  No acute deformities  Skin:  Warm/dry, no edema, rashes or lesions   Recent Labs Lab 11/30/15 1036  NA 136  K 3.6  CL 103  CO2 25  BUN 11  CREATININE 0.91  GLUCOSE 146*    Recent Labs Lab 11/30/15 1036  HGB 9.6*  HCT 30.7*  WBC 5.2  PLT 443*   Dg Chest 2 View  11/30/2015  CLINICAL DATA:  Shortness of Breath, history of uterine cancer, post hysterectomy EXAM: CHEST  2 VIEW COMPARISON:  09/27/2014 FINDINGS: Borderline cardiomegaly. There is moderate to large left pleural effusion. Atelectasis or infiltrate is noted in left lower lobe. No pulmonary edema. Stable right IJ Port-A-Cath position. There is a nodular density in right lower lobe measure about 1.3 cm. Further correlation with CT scan of the chest is recommended. IMPRESSION: There is moderate to large left pleural effusion. Atelectasis or infiltrate is noted in left lower lobe. No pulmonary edema. Stable right IJ Port-A-Cath position. There is a nodular density in right lower lobe measure about 1.3  cm. Further evaluation with CT scan of the chest is recommended. Electronically Signed   By: Lahoma Crocker M.D.   On: 11/30/2015 11:04   Ct Chest W Contrast  11/30/2015  CLINICAL DATA:  Weakness, shortness of breath for 4-5 months, worsening. Intermittent dry cough. History of endometrial carcinoma diagnosed in October of 2015. EXAM: CT CHEST WITH CONTRAST TECHNIQUE: Multidetector CT imaging of the chest was performed during intravenous contrast administration. CONTRAST:  64mL ISOVUE-300 IOPAMIDOL (ISOVUE-300) INJECTION 61% COMPARISON:  Chest CT dated 11/04/2014. FINDINGS: Mediastinum/Lymph Nodes: New  enlarged and centrally necrotic lymph nodes are seen in the right hilum, measuring 1.8 and 1.3 cm short axis dimension. Probable additional centrally necrotic lymph node is seen within the left lower perihilar region measuring 1.6 cm short axis dimension. Numerous additional lymph nodes are seen within the mediastinum, most prominently in the left paratracheal and aortopulmonary window regions, certainly pathologic by number. Configuration of the thoracic aorta is stable, with associated mild atherosclerotic change. Heart size is normal although displaced to the right by the large left pleural effusion. Lungs/Pleura: There were several small pulmonary nodules identified on chest CT of 11/04/2014, largest measuring 6 mm. There has been marked progression of disease since that previous chest CT. There are multiple new and larger pulmonary nodules/masses within the right lung. This includes a 3.1 x 2.6 cm mass at the right lung base posteriorly (series 5, image 121), a 1.8 x 1.6 cm nodule within the right lower lobe posteriorly (series 5, image 103), a 1.3 x 1.3 cm nodule within the posterior-lateral portion of the right lower lobe (series 5, image 96), an irregular nodule within the superior segment of the right lower lobe measuring 1.2 x 0.9 cm (series 5, image 79) and a pleural based nodule adjacent to the right major fissure measuring 1.3 x 0.8 cm (series 5, image 67). There is a new large left pleural effusion with adjacent compressive atelectasis. Pleural based mass within the left upper lobe medially measures 2.6 x 1.7 cm (series 5, image 48). There is also irregular pleural thickening along the medial and anterior aspects of the left upper lobe, highly suggestive of neoplastic involvement. Emphysematous changes noted bilaterally, upper lobe predominant. Upper abdomen: Limited images of the upper abdomen are unremarkable. Musculoskeletal: Scattered tiny lucent foci are seen within the thoracic vertebral bodies,  too small to definitively characterize, possibly early developing lytic metastases. No definite evidence of osseous metastasis identified. IMPRESSION: 1. Significant progression of disease, as detailed above. This includes numerous pulmonary masses/nodules within the right lung, largest of which is at the right lung base measuring 3.1 x 2.6 cm. A new pleural based mass is now seen along the medial aspects of the left upper lobe measuring 2.6 x 1.7 cm. On earlier chest CT of 11/04/2014, the largest pulmonary nodule identified within the left lung was 6 mm and the largest pulmonary nodule identified within the right lung was 4 mm. 2. Mediastinal and perihilar lymphadenopathy, most numerous within the left lower paratracheal and aortopulmonary window regions, with largest lymph nodes in the right perihilar region demonstrating evidence of central necrosis, almost certainly compatible with metastatic lymphadenopathy throughout and further evidence of progression of disease. 3. Large left pleural effusion, likely malignant effusion, with adjacent compressive atelectasis. Heart is displaced to the right by the large left pleural effusion. 4. Emphysematous change, upper lobe predominant. 5. No definite evidence of osseous metastasis. Scattered tiny lucent foci within the thoracic vertebral bodies could conceivably represent early developing metastases. These  results were called by telephone at the time of interpretation on 11/30/2015 at 12:39 pm to Dr. Carmin Muskrat , who verbally acknowledged these results. Electronically Signed   By: Franki Cabot M.D.   On: 11/30/2015 12:43   STUDIES:  Pleural fluid 6/15 >>   LDH >>  Protein >>   GS >>   Culture >>   Cytology >>   SIGNIFICANT EVENTS  6/15  Admit with malaise/fatigue  STUDIES:  6/15  CXR >> moderate to large left pleural effusion with associated atelectasis / infiltrate in the LLL, and a nodular density in the RLL (~1.3 cm).   6/15  CT of the Chest >>  significant progression of disease with numerous pulmonary nodules (largest measuring 3.1x2.6 cm), new pleural based mass along the medial aspects of the LUL measuring 2.6x1.7, mediastinal and perihilar LAN, large left pleural effusion, emphysematous changes of the upper lobes and no definite evidence of osseus metastases but scattered tiny lucent foci in the thoracic vertebral bodies could be early developing metastases.   ASSESSMENT / PLAN:  Discussion: 65 y/o F, former smoker, with PMH of uterine cancer s/p hysterectomy / radiation thought to be in remission admitted 6/15 with fatigue. Found to have large left pleural effusion and progression of pulmonary nodules concerning for recurrence of malignancy.     Left Pleural Effusion  Multiple Pulmonary Nodules  Acute Hypoxic Respiratory Failure Former Smoker  Plan: Proceed with bedside thoracentesis.  Patient consented for procedure with risks / benefits.  Pleural fluid analysis as above  Serum studies to match Oxygen as needed to support saturations > 90% Pulmonary hygiene > IS, mobilize  Follow up CXR post thoracentesis  Pending cytology, may need further ONC evaluation.  Currently followed by Dr. Marko Plume.  Noe Gens, NP-C [facility] Pgr: [phone] or if no answer [phone] 11/30/2015, 2:06 PM   Attending note: I have seen and examined the patient with nurse practitioner/resident and agree with the note. History, labs and imaging reviewed.  64 Y/O with h/o uterine cancer admitted with lt effusion. CT scan reviewed concerning for recurrence of malignancy, S/p thoracentesis. We will follow on cytology and other studies.  Marshell Garfinkel MD [facility] Pager [phone] If no answer or after 3pm call: [phone] 11/30/2015, 6:06 PM,s

## 2015-11-30 NOTE — H&P (Signed)
History and Physical  Sheena Caldwell [id number] DOB: [birth year] DOA: 11/30/2015  Referring physician: ER Phsyician PCP: Sheral Apley  Outpatient Specialists: Dr. 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 According to the patient, she has been feeling progressively weak over the last 5 months. Over the last 2 months, patient has become progressively SOB necessitating ER visit today. No fever, no headache, no neck pain, no GI symptoms and no urinary symptoms. CT Scan of the Chest revealed significant left pleural effusion with multiple nodules.    Pertinent labs: CT findings. Low voltage EKG EKG: Independently reviewed. Low voltage Imaging: independently reviewed.   Review of Systems: As in HPI. Negative for fever, visual changes, sore throat, rash, new muscle aches, chest pain, dysuria, bleeding, n/v/abdominal pain.  Past Medical History  Diagnosis Date  . Uterine cancer [facility])     finished chemo May 2016, to start radiation July 2016  . Radiation 01/03/15, 01/11/15, 01/13/15, 01/24/15, 01/26/15    proximal vagina 30 gray  . Arthritis   . Blood transfusion without reported diagnosis     2015, 2016  . COPD (chronic obstructive pulmonary disease) [facility])     Past Surgical History  Procedure Laterality Date  . Cesarean section  1991  . Incise and drain abcess  1970    scalp  . Portacath placement  04/26/2014    rt. power port with tip in SVC  . Total abdominal hysterectomy w/ bilateral salpingoophorectomy  04/06/14    TAH, BSO, omentectomy, pelvic and right common iliac node evaluation     reports that she quit smoking about 2 years ago. Her smoking use included Cigarettes. She has a 30 pack-year smoking history. She has never used smokeless tobacco. She reports that she drinks about 1.2 - 1.8 oz of alcohol per week. She reports  that she uses illicit drugs (Marijuana).  Allergies  Allergen Reactions  . Levaquin [Levofloxacin In D5w] Other (See Comments)    "I just felt horrible"    Family History  Problem Relation Age of Onset  . Hypertension Mother   . Cancer Father   . Colon cancer Neg Hx   . Esophageal cancer Neg Hx   . Rectal cancer Neg Hx   . Stomach cancer Neg Hx      Prior to Admission medications   Medication Sig Start Date End Date Taking? Authorizing Provider  albuterol (PROVENTIL HFA;VENTOLIN HFA) 108 (90 Base) MCG/ACT inhaler Inhale 1 puff into the lungs every 6 (six) hours as needed for wheezing or shortness of breath.   Yes Historical Provider, MD  Calcium Citrate-Vitamin D (CALCIUM + D PO) Take 1 tablet by mouth daily.   Yes Historical Provider, MD  cholecalciferol (VITAMIN D) 1000 units tablet Take 1,000 Units by mouth daily.   Yes Historical Provider, MD  ferrous sulfate 325 (65 FE) MG tablet Take 325 mg by mouth daily.   Yes Historical Provider, MD  fludrocortisone (FLORINEF) 0.1 MG tablet Take 0.05 mg by mouth daily.   Yes Historical Provider, MD  mirtazapine (REMERON) 30 MG tablet Take 30 mg by mouth at bedtime.   Yes Historical Provider, MD  Multiple Vitamin (MULTI-VITAMINS) TABS Take 1 tablet by mouth daily.   Yes Historical Provider, MD  polyethylene glycol (MIRALAX / GLYCOLAX) packet Take 17 g by mouth daily as needed.   Yes Historical Provider, MD  UNABLE TO  FIND Med Name: smooth move drink for constipation   Yes Historical Provider, MD  docusate sodium (COLACE) 100 MG capsule Take 1 capsule (100 mg total) by mouth every 12 (twelve) hours. 12/25/14   Noemi Chapel, MD    Physical Exam: Filed Vitals:   11/30/15 1006 11/30/15 1009  BP: 105/66   Pulse: 106   Temp: 97.9 F (36.6 C)   TempSrc: Oral   Resp: 24   SpO2: 86% 93%    Constitutional:  . Cachectic. Eyes:  Marland Kitchen Mild pallor. No jaundice.  ENMT:  . external ears, nose appear normal Neck:  . Neck is supple. No  JVD Respiratory:  . No air entry left lung field posteriorly  Cardiovascular:  . S1S2 . No LE extremity edema   Abdomen:  . Abdomen is soft and non tender. Organs are difficult to assess. Neurologic:  . Awake and alert. . Moves all limbs.  Wt Readings from Last 3 Encounters:  10/02/15 64.229 kg (141 lb 9.6 oz)  09/07/15 65 kg (143 lb 4.8 oz)  08/10/15 63.73 kg (140 lb 8 oz)    I have personally reviewed following labs and imaging studies  Labs on Admission:  CBC:  Recent Labs Lab 11/30/15 1036  WBC 5.2  HGB 9.6*  HCT 30.7*  MCV 93.9  PLT 123456*   Basic Metabolic Panel:  Recent Labs Lab 11/30/15 1036  NA 136  K 3.6  CL 103  CO2 25  GLUCOSE 146*  BUN 11  CREATININE 0.91  CALCIUM 8.6*   Liver Function Tests:  Recent Labs Lab 11/30/15 1036  AST 25  ALT 24  ALKPHOS 106  BILITOT 0.4  PROT 8.4*  ALBUMIN 2.5*   No results for input(s): LIPASE, AMYLASE in the last 168 hours. No results for input(s): AMMONIA in the last 168 hours. Coagulation Profile: No results for input(s): INR, PROTIME in the last 168 hours. Cardiac Enzymes:  Recent Labs Lab 11/30/15 1036  TROPONINI <0.03   BNP (last 3 results) No results for input(s): PROBNP in the last 8760 hours. HbA1C: No results for input(s): HGBA1C in the last 72 hours. CBG: No results for input(s): GLUCAP in the last 168 hours. Lipid Profile: No results for input(s): CHOL, HDL, LDLCALC, TRIG, CHOLHDL, LDLDIRECT in the last 72 hours. Thyroid Function Tests: No results for input(s): TSH, T4TOTAL, FREET4, T3FREE, THYROIDAB in the last 72 hours. Anemia Panel: No results for input(s): VITAMINB12, FOLATE, FERRITIN, TIBC, IRON, RETICCTPCT in the last 72 hours. Urine analysis:    Component Value Date/Time   COLORURINE YELLOW 12/25/2014 1052   APPEARANCEUR CLEAR 12/25/2014 1052   LABSPEC 1.010 09/07/2015 0918   LABSPEC 1.018 12/25/2014 1052   PHURINE 6.5 09/07/2015 0918   PHURINE 7.5 12/25/2014 1052    GLUCOSEU Negative 09/07/2015 0918   GLUCOSEU NEGATIVE 12/25/2014 1052   HGBUR Moderate 09/07/2015 0918   HGBUR SMALL* 12/25/2014 1052   BILIRUBINUR Negative 09/07/2015 0918   BILIRUBINUR NEGATIVE 12/25/2014 1052   KETONESUR Negative 09/07/2015 0918   KETONESUR NEGATIVE 12/25/2014 1052   PROTEINUR Negative 09/07/2015 0918   PROTEINUR NEGATIVE 12/25/2014 1052   UROBILINOGEN 0.2 09/07/2015 0918   UROBILINOGEN 0.2 12/25/2014 1052   NITRITE Negative 09/07/2015 0918   NITRITE NEGATIVE 12/25/2014 1052   LEUKOCYTESUR Negative 09/07/2015 0918   LEUKOCYTESUR NEGATIVE 12/25/2014 1052   Sepsis Labs: @LABRCNTIP (procalcitonin:4,lacticidven:4) )No results found for this or any previous visit (from the past 240 hour(s)).    Radiological Exams on Admission: Dg Chest 2 View  11/30/2015  CLINICAL DATA:  Shortness of Breath, history of uterine cancer, post hysterectomy EXAM: CHEST  2 VIEW COMPARISON:  09/27/2014 FINDINGS: Borderline cardiomegaly. There is moderate to large left pleural effusion. Atelectasis or infiltrate is noted in left lower lobe. No pulmonary edema. Stable right IJ Port-A-Cath position. There is a nodular density in right lower lobe measure about 1.3 cm. Further correlation with CT scan of the chest is recommended. IMPRESSION: There is moderate to large left pleural effusion. Atelectasis or infiltrate is noted in left lower lobe. No pulmonary edema. Stable right IJ Port-A-Cath position. There is a nodular density in right lower lobe measure about 1.3 cm. Further evaluation with CT scan of the chest is recommended. Electronically Signed   By: Lahoma Crocker M.D.   On: 11/30/2015 11:04   Ct Chest W Contrast  11/30/2015  CLINICAL DATA:  Weakness, shortness of breath for 4-5 months, worsening. Intermittent dry cough. History of endometrial carcinoma diagnosed in October of 2015. EXAM: CT CHEST WITH CONTRAST TECHNIQUE: Multidetector CT imaging of the chest was performed during intravenous contrast  administration. CONTRAST:  57mL ISOVUE-300 IOPAMIDOL (ISOVUE-300) INJECTION 61% COMPARISON:  Chest CT dated 11/04/2014. FINDINGS: Mediastinum/Lymph Nodes: New enlarged and centrally necrotic lymph nodes are seen in the right hilum, measuring 1.8 and 1.3 cm short axis dimension. Probable additional centrally necrotic lymph node is seen within the left lower perihilar region measuring 1.6 cm short axis dimension. Numerous additional lymph nodes are seen within the mediastinum, most prominently in the left paratracheal and aortopulmonary window regions, certainly pathologic by number. Configuration of the thoracic aorta is stable, with associated mild atherosclerotic change. Heart size is normal although displaced to the right by the large left pleural effusion. Lungs/Pleura: There were several small pulmonary nodules identified on chest CT of 11/04/2014, largest measuring 6 mm. There has been marked progression of disease since that previous chest CT. There are multiple new and larger pulmonary nodules/masses within the right lung. This includes a 3.1 x 2.6 cm mass at the right lung base posteriorly (series 5, image 121), a 1.8 x 1.6 cm nodule within the right lower lobe posteriorly (series 5, image 103), a 1.3 x 1.3 cm nodule within the posterior-lateral portion of the right lower lobe (series 5, image 96), an irregular nodule within the superior segment of the right lower lobe measuring 1.2 x 0.9 cm (series 5, image 79) and a pleural based nodule adjacent to the right major fissure measuring 1.3 x 0.8 cm (series 5, image 67). There is a new large left pleural effusion with adjacent compressive atelectasis. Pleural based mass within the left upper lobe medially measures 2.6 x 1.7 cm (series 5, image 48). There is also irregular pleural thickening along the medial and anterior aspects of the left upper lobe, highly suggestive of neoplastic involvement. Emphysematous changes noted bilaterally, upper lobe predominant.  Upper abdomen: Limited images of the upper abdomen are unremarkable. Musculoskeletal: Scattered tiny lucent foci are seen within the thoracic vertebral bodies, too small to definitively characterize, possibly early developing lytic metastases. No definite evidence of osseous metastasis identified. IMPRESSION: 1. Significant progression of disease, as detailed above. This includes numerous pulmonary masses/nodules within the right lung, largest of which is at the right lung base measuring 3.1 x 2.6 cm. A new pleural based mass is now seen along the medial aspects of the left upper lobe measuring 2.6 x 1.7 cm. On earlier chest CT of 11/04/2014, the largest pulmonary nodule identified within the left lung was 6 mm and  the largest pulmonary nodule identified within the right lung was 4 mm. 2. Mediastinal and perihilar lymphadenopathy, most numerous within the left lower paratracheal and aortopulmonary window regions, with largest lymph nodes in the right perihilar region demonstrating evidence of central necrosis, almost certainly compatible with metastatic lymphadenopathy throughout and further evidence of progression of disease. 3. Large left pleural effusion, likely malignant effusion, with adjacent compressive atelectasis. Heart is displaced to the right by the large left pleural effusion. 4. Emphysematous change, upper lobe predominant. 5. No definite evidence of osseous metastasis. Scattered tiny lucent foci within the thoracic vertebral bodies could conceivably represent early developing metastases. These results were called by telephone at the time of interpretation on 11/30/2015 at 12:39 pm to Dr. Carmin Muskrat , who verbally acknowledged these results. Electronically Signed   By: Franki Cabot M.D.   On: 11/30/2015 12:43    EKG: Independently reviewed.   Active Problems:   Pleural effusion   Malignant pleural effusion   Assessment/Plan 1. SOB likely secondary to massive left sided pleural  effusion 2. Left sided pleural effusion likely malignant effusion 3. Multiple lung nodules, possibly metastatic endometrial cancer 4. Cachexia 5. Moderate Malnutrition   Admit patient for further work up  Pulmonary consult called  Oncology consult  ECHO to rule out malignant Pericardial effusion (Low voltage EKG and low normal BP)  Consult dietician  Further management will depend on hospital course Guarded prognosis  DVT prophylaxis: Lovenox Code Status: Full Family Communication:  Disposition Plan: Undetermined   Consults called: Pulmonary and Oncology   Admission status: Inpatient    Time spent: 60 minutes  Dana Allan, MD  [facility] Pager #: [phone] 7PM-7AM contact night coverage as above   11/30/2015, 2:05 PM

## 2015-11-30 NOTE — ED Notes (Signed)
Hospitalist and critical care NP at bedside.

## 2015-11-30 NOTE — Procedures (Signed)
Thoracentesis Procedure Note  Pre-operative Diagnosis: Left Pleural Effusion   Post-operative Diagnosis: same  Indications: Diagnostic evaluation of pleural fluid and therapeutic relief of dyspnea  Procedure Details  Consent: Informed consent was obtained. Risks of the procedure were discussed including: infection, bleeding, pain, pneumothorax.  Under sterile conditions the patient was positioned. Betadine solution and sterile drapes were utilized.  1% plain lidocaine was used to anesthetize the 8th rib space. Fluid was obtained without any difficulties and minimal blood loss.  A dressing was applied to the wound and wound care instructions were provided.   Findings 1500 ml of cloudy amber pleural fluid was obtained. A sample was sent to Pathology, gram stain, cell counts, culture, LDH & protein as well as for infection analysis.  Complications:  None; patient tolerated the procedure well.          Condition: stable  Plan A follow up chest x-ray was ordered. Pleural fluid to lab  Performed with ultrasound guidance.    Noe Gens, NP-C [facility] Pgr: [phone] or if no answer [phone] 11/30/2015, 4:43 PM

## 2015-11-30 NOTE — Progress Notes (Signed)
Best of 3 attempts = Peak Flow 300.

## 2015-11-30 NOTE — ED Provider Notes (Signed)
CSN: [id number]     Arrival date & time 11/30/15  [id number] History   First MD Initiated Contact with Patient 11/30/15 0957     Chief Complaint  Patient presents with  . Fatigue  . Cough  . Shortness of Breath     (Consider location/radiation/quality/duration/timing/severity/associated sxs/prior Treatment) HPI Patient presents with concern of dyspnea, fatigue. Symptoms of been present for months, worsening over the past few weeks. Patient acknowledges a history of COPD, uterine cancer. Patient has been in remission for the past year. Currently she denies any chest pain, headache, confusion, disorientation. She complains essentially of profound weakness, inability to perform activities of daily living, as well as ongoing dyspnea. She is a former smoker. No recent steroid use, albuterol use.  Past Medical History  Diagnosis Date  . Uterine cancer [facility])     finished chemo May 2016, to start radiation July 2016  . Radiation 01/03/15, 01/11/15, 01/13/15, 01/24/15, 01/26/15    proximal vagina 30 gray  . Arthritis   . Blood transfusion without reported diagnosis     2015, 2016  . COPD (chronic obstructive pulmonary disease) [facility])    Past Surgical History  Procedure Laterality Date  . Cesarean section  1991  . Incise and drain abcess  1970    scalp  . Portacath placement  04/26/2014    rt. power port with tip in SVC  . Total abdominal hysterectomy w/ bilateral salpingoophorectomy  04/06/14    TAH, BSO, omentectomy, pelvic and right common iliac node evaluation   Family History  Problem Relation Age of Onset  . Hypertension Mother   . Cancer Father   . Colon cancer Neg Hx   . Esophageal cancer Neg Hx   . Rectal cancer Neg Hx   . Stomach cancer Neg Hx    Social History  Substance Use Topics  . Smoking status: Former Smoker -- 1.00 packs/day for 30 years    Types: Cigarettes    Quit date: 07/16/2013  . Smokeless tobacco: Never Used  . Alcohol Use: 1.2 - 1.8 oz/week    2-3 Glasses  of wine per week   OB History    Gravida Para Term Preterm AB TAB SAB Ectopic Multiple Living   3 2 2  0 1 0 0 0 0 2     Review of Systems  Constitutional:       Per HPI, otherwise negative  HENT:       Per HPI, otherwise negative  Respiratory:       Per HPI, otherwise negative  Cardiovascular:       Per HPI, otherwise negative  Gastrointestinal: Negative for vomiting.  Endocrine:       Negative aside from HPI  Genitourinary:       Neg aside from HPI   Musculoskeletal:       Per HPI, otherwise negative  Skin: Negative.   Allergic/Immunologic: Positive for immunocompromised state.  Neurological: Positive for weakness. Negative for syncope.      Allergies  Levaquin  Home Medications   Prior to Admission medications   Medication Sig Start Date End Date Taking? Authorizing Provider  albuterol (PROVENTIL HFA;VENTOLIN HFA) 108 (90 Base) MCG/ACT inhaler Inhale 1 puff into the lungs every 6 (six) hours as needed for wheezing or shortness of breath.   Yes Historical Provider, MD  Calcium Citrate-Vitamin D (CALCIUM + D PO) Take 1 tablet by mouth daily.   Yes Historical Provider, MD  cholecalciferol (VITAMIN D) 1000 units tablet Take 1,000 Units by  mouth daily.   Yes Historical Provider, MD  ferrous sulfate 325 (65 FE) MG tablet Take 325 mg by mouth daily.   Yes Historical Provider, MD  fludrocortisone (FLORINEF) 0.1 MG tablet Take 0.05 mg by mouth daily.   Yes Historical Provider, MD  mirtazapine (REMERON) 30 MG tablet Take 30 mg by mouth at bedtime.   Yes Historical Provider, MD  Multiple Vitamin (MULTI-VITAMINS) TABS Take 1 tablet by mouth daily.   Yes Historical Provider, MD  polyethylene glycol (MIRALAX / GLYCOLAX) packet Take 17 g by mouth daily as needed.   Yes Historical Provider, MD  UNABLE TO FIND Med Name: smooth move drink for constipation   Yes Historical Provider, MD  docusate sodium (COLACE) 100 MG capsule Take 1 capsule (100 mg total) by mouth every 12 (twelve)  hours. 12/25/14   Noemi Chapel, MD   BP 105/66 mmHg  Pulse 106  Temp(Src) 97.9 F (36.6 C) (Oral)  Resp 24  SpO2 93% Physical Exam  Constitutional: She is oriented to person, place, and time. She has a sickly appearance. No distress.  HENT:  Head: Normocephalic and atraumatic.  Eyes: Conjunctivae and EOM are normal.  Cardiovascular: Regular rhythm.  Tachycardia present.   Pulmonary/Chest: Effort normal and breath sounds normal. No stridor. No respiratory distress.    Abdominal: She exhibits no distension.  Musculoskeletal: She exhibits no edema.  Neurological: She is alert and oriented to person, place, and time. No cranial nerve deficit.  Skin: Skin is warm and dry.  Psychiatric: She has a normal mood and affect.  Nursing note and vitals reviewed.   ED Course  Procedures (including critical care time) Labs Review Labs Reviewed  CBC - Abnormal; Notable for the following:    RBC 3.27 (*)    Hemoglobin 9.6 (*)    HCT 30.7 (*)    RDW 17.2 (*)    Platelets 443 (*)    All other components within normal limits  COMPREHENSIVE METABOLIC PANEL  BRAIN NATRIURETIC PEPTIDE  TROPONIN I    Imaging Review Dg Chest 2 View  11/30/2015  CLINICAL DATA:  Shortness of Breath, history of uterine cancer, post hysterectomy EXAM: CHEST  2 VIEW COMPARISON:  09/27/2014 FINDINGS: Borderline cardiomegaly. There is moderate to large left pleural effusion. Atelectasis or infiltrate is noted in left lower lobe. No pulmonary edema. Stable right IJ Port-A-Cath position. There is a nodular density in right lower lobe measure about 1.3 cm. Further correlation with CT scan of the chest is recommended. IMPRESSION: There is moderate to large left pleural effusion. Atelectasis or infiltrate is noted in left lower lobe. No pulmonary edema. Stable right IJ Port-A-Cath position. There is a nodular density in right lower lobe measure about 1.3 cm. Further evaluation with CT scan of the chest is recommended.  Electronically Signed   By: Lahoma Crocker M.D.   On: 11/30/2015 11:04   Ct Chest W Contrast  11/30/2015  CLINICAL DATA:  Weakness, shortness of breath for 4-5 months, worsening. Intermittent dry cough. History of endometrial carcinoma diagnosed in October of 2015. EXAM: CT CHEST WITH CONTRAST TECHNIQUE: Multidetector CT imaging of the chest was performed during intravenous contrast administration. CONTRAST:  3mL ISOVUE-300 IOPAMIDOL (ISOVUE-300) INJECTION 61% COMPARISON:  Chest CT dated 11/04/2014. FINDINGS: Mediastinum/Lymph Nodes: New enlarged and centrally necrotic lymph nodes are seen in the right hilum, measuring 1.8 and 1.3 cm short axis dimension. Probable additional centrally necrotic lymph node is seen within the left lower perihilar region measuring 1.6 cm short axis dimension.  Numerous additional lymph nodes are seen within the mediastinum, most prominently in the left paratracheal and aortopulmonary window regions, certainly pathologic by number. Configuration of the thoracic aorta is stable, with associated mild atherosclerotic change. Heart size is normal although displaced to the right by the large left pleural effusion. Lungs/Pleura: There were several small pulmonary nodules identified on chest CT of 11/04/2014, largest measuring 6 mm. There has been marked progression of disease since that previous chest CT. There are multiple new and larger pulmonary nodules/masses within the right lung. This includes a 3.1 x 2.6 cm mass at the right lung base posteriorly (series 5, image 121), a 1.8 x 1.6 cm nodule within the right lower lobe posteriorly (series 5, image 103), a 1.3 x 1.3 cm nodule within the posterior-lateral portion of the right lower lobe (series 5, image 96), an irregular nodule within the superior segment of the right lower lobe measuring 1.2 x 0.9 cm (series 5, image 79) and a pleural based nodule adjacent to the right major fissure measuring 1.3 x 0.8 cm (series 5, image 67). There is a  new large left pleural effusion with adjacent compressive atelectasis. Pleural based mass within the left upper lobe medially measures 2.6 x 1.7 cm (series 5, image 48). There is also irregular pleural thickening along the medial and anterior aspects of the left upper lobe, highly suggestive of neoplastic involvement. Emphysematous changes noted bilaterally, upper lobe predominant. Upper abdomen: Limited images of the upper abdomen are unremarkable. Musculoskeletal: Scattered tiny lucent foci are seen within the thoracic vertebral bodies, too small to definitively characterize, possibly early developing lytic metastases. No definite evidence of osseous metastasis identified. IMPRESSION: 1. Significant progression of disease, as detailed above. This includes numerous pulmonary masses/nodules within the right lung, largest of which is at the right lung base measuring 3.1 x 2.6 cm. A new pleural based mass is now seen along the medial aspects of the left upper lobe measuring 2.6 x 1.7 cm. On earlier chest CT of 11/04/2014, the largest pulmonary nodule identified within the left lung was 6 mm and the largest pulmonary nodule identified within the right lung was 4 mm. 2. Mediastinal and perihilar lymphadenopathy, most numerous within the left lower paratracheal and aortopulmonary window regions, with largest lymph nodes in the right perihilar region demonstrating evidence of central necrosis, almost certainly compatible with metastatic lymphadenopathy throughout and further evidence of progression of disease. 3. Large left pleural effusion, likely malignant effusion, with adjacent compressive atelectasis. Heart is displaced to the right by the large left pleural effusion. 4. Emphysematous change, upper lobe predominant. 5. No definite evidence of osseous metastasis. Scattered tiny lucent foci within the thoracic vertebral bodies could conceivably represent early developing metastases. These results were called by  telephone at the time of interpretation on 11/30/2015 at 12:39 pm to Dr. Carmin Muskrat , who verbally acknowledged these results. Electronically Signed   By: Franki Cabot M.D.   On: 11/30/2015 12:43   I have personally reviewed and evaluated these images and lab results as part of my medical decision-making.  I discussed the patient's CT scan with our radiologist, reviewed the images myself, agree with the interpretation.   EKG Interpretation   Date/Time:  Thursday November 30 2015 10:09:04 EDT Ventricular Rate:  97 PR Interval:  136 QRS Duration: 60 QT Interval:  424 QTC Calculation: 539 R Axis:   51 Text Interpretation:  Sinus rhythm Low voltage, extremity and precordial  leads T wave abnormality Prolonged QT interval Abnormal ekg  Confirmed by  Carmin Muskrat  MD [phone]) on 11/30/2015 11:22:32 AM     Pulse oximetry 85% room air abnormal Correct to 97% with nasal cannula   Cardiac 100/105, sinus, unremarkable  2:01 PM On repeat exam the patient is calm. She is aware of all findings, including concern for recurrent malignancy. I discussed her case with our hospitalist for admission.  MDM   Final diagnoses:  Hypoxia  Pleural effusion   Patient with history of recent cancer now presents with dyspnea, fatigue. Patient is found to have likely recurrent malignancy, with left-sided pleural effusion, and multiple pulmonary nodules identified on CT scan. Patient is hypoxic, this improved with nasal cannula. Patient required admission for further evaluation, management after discussion with radiology, the hospitalist service.   Carmin Muskrat, MD 11/30/15 [phone]

## 2015-11-30 NOTE — Progress Notes (Signed)
BP 86/62 and temp 101.3. Pt asymptomatic. Tylenol 650 mg PO given and pt encouraged to use incentive spirometer. NP on call notified. No new orders placed. Will continue to monitor closely

## 2015-11-30 NOTE — ED Notes (Signed)
Nurse collecting labs. 

## 2015-11-30 NOTE — Progress Notes (Signed)
Medical Oncology  November 30, 2015, 7:49 PM  Hospital day 1 Antibiotics: none Chemotherapy: carboplatin taxol x 6 thru 10-21-2014 for IIIC high grade serous endometrial carcinoma.  Outpatient Physicians: Everitt Amber, Dorann Ou Hedgecock(PCP, [facility] [employer]), _ [name] [facility]); Louis Meckel ([facility]), [name] , Owens Loffler ([facility]), L.Lihue notification from hospitalist service of admission today from ED, with SOB, with large right pleural effusion, pulmonary nodules and central chest adenopathy by CT.  Thoracentesis done by pulmonary, 1500 cc cloudy amber fluid, with partial improvement in effusion by follow up CXR. EMR reviewed and patient seen now.  Subjective: Patient reports feeling progressively worse overall in last couple of months, with more recent SOB, no chest pain, no productive cough or hemoptysis. She has been on 3 antidepressants, tolerated poorly. Appetite has been fair, however she was having difficulty with exertion required to prepare food, so frequently just drinking Boost; she has eaten part of 2 meals at hospital today. She has had some chronic constipation, no abdominal or pelvic discomfort or distension, no LE swelling, no fever or symptoms of infection, no HA or other neurologic symptoms, no other bleeding. PAC not uncomfortable, recently flushed. No other pain. No difficulty voiding. Not clearly more symptomatic from low BP, which was baseline during previous treatment. Remainder of 10 point Review of Systems unchanged.    ONCOLOGIC HISTORY  Patient had one episode of post menopausal vaginal bleeding early 2015. Later in 2015 she had persistent vaginal discharge, for which she was seen by PCP Suzann Hedgecock in ~ 02-2014, had CTs in [facility] and was referred to Dr Adella Nissen, gyn in [facility]. Endometrial biopsy had concern (that path not included in information sent for  consultation) such that she was referred to Dr Jacquelyne Balint. Surgery by Dr Sabra Heck at [facility] in [place] on 04-06-14 was TAH, BSO, omentectomy, pelvic and right common iliac node evaluation; patient was hospitalized x 3 days and tells me that she recovered well from the surgery. Pathology [phone]) from 04-06-14 found high grade serous carcinoma involving entire thickness of myometrium (depth of invasion 1.1 cm out of 1.1 cm), with invasion of cervical stroma, no involvement of uterine serosa/bilateral tubes and ovaries/omentum, positive LVSI, 2/5 right pelvic nodes, 2 of 4 left pelvic nodes and 0/1 right common iliac node. Bulk of the carcinoma was in lower uterine segment where it was within 0.1 cm of serosal surface. Cytology positive for adenocarcinoma on washings ([id number]). Surgical findings were significant for no paraaortic adenopathy apparent. Patient has received 3 cycles of adjuvant chemotherapy in [facility] by Dr Sabra Heck, on 05-05-14, 05-26-14 and 07-29-14. Per records and patient, delay of cycle 3 was related to low counts and insurance changes. She received neulasta after chemotherapy on 07-29-14. Taxol was given at 175 mg/m2 for total dose was 280 mg cycle 1 and 291 mg for cycles 2 and 3; carboplatin was AUC = 6 with total dose 610 mg cycle 1, 620 mg cycle 2 and 630 mg cycle 3.. She does not recall using oral decadron premed for taxol, with premeds listed on chemo flowsheets standard decadron 20 mg, zofran 16 mg, benadryl 50 mg (which caused severe restless legs) and pepcid 20 mg. Outside information does not include serial blood counts. Last note from Dr Sabra Heck dated 07-29-14 describes unremarkable exam, "NED during chemotherapy and adequate PS". Patient was see by Dr Denman George 08-09-14, who recommends restaging scans after 6 cycles of chemotherapy  and consideration of vaginal brachytherapy; she has not been seen by radiation oncology in [place]. CT AP done in [facility] system 08-15-14 (due to elevated  LFTs and blood in urine just after transfer of care) with soft tissue fullness at superior abdominal aorta and question of retroperitoneal necrotic adenopathy; plan repeat scans after complete present chemotherapy. 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 CT AP 12-25-14 without adenopathy or other apparent residual or recurrent malignancy.  Radiation treatment dates: July 19, July 27, July 29, August 9, August 11 Site/dose: Proximal vagina, 30 gray in 5 fractions  Anemia: hemoglobin ranged from 8.1 - 12.6 thru chemo, with MCV 103 when I met her after first chemo and up to 109 during treatment. Iron studies 08-2014 had serum iron 43, %sat 22, ferritin 429, B12 >2000 SIEP 10-2014 had no monoclonal protein (elevated IgG and IgA polyclonal). Urinalyses had microscopic hematuria, with cystoscopy by Dr Burman Nieves at [facility] 10-20-2014 unremarkable. Haptoglobin 08-2014 not low (449), LDH normal, total bili occasionally elevated (2-29-26) but often normal. She was transfused 2 units PRBCs 09-2014 for hemoglobin 8.1.  Colonoscopy by Dr Oretha Caprice [facility] 05-23-15 unable to advance pediatric scope beyond sigmoid colon, which seemed to be fixed.   Labs from PCP/ [facility] 08-21-15 WBC 4.8, Hgb 9.6, plt 392, MCV 99.7 07-25-15 WBC 4.9, Hgb 10.4, plt 370, MCV 103, B12 480, folate >23, ferritin 296 04-26-15 WBC 3.3, Hgb 13.3, plt 225, MCV 106.9 03-23-14 WBC 8.3, hgb 7.8, plt 516, MCV 91 02-2014 FOB negative  Anemia labs from 08-2015: FOB negative x3 RBC rouleaux called 24 hour urine: no M spike, protein <118 mg/24 hrs SPEP: No M spike, pattern of inflammatory response UA Moderate blood Serum iron 30, %sat 17   PAST MEDICAL/ SURGICAL HISTORY:  Bronchitis/ COPD ~ 2014 G2P2, C section Menarche 14/ menopause [place] 03-2009 Drained abscess on top of head age 64 PAC in T[facility]  BSO, omentectomy, nodes 04-06-2014 History depression   SOCIAL HISTORY:  Lives alone in [place], daughter in [place] area, son in [place].. Sister, aunts, mother all in [place]. Cigarettes x 20-30 years generally 1 ppd, recently 30. Some hx ETOH, THC . Previously worked in [employer] (medical).  FAMILY HISTORY:  Throat cancer in father, substance abuse history not known Mother active at age 83 7 siblings, no other cancer known  Objective: Vital signs in last 24 hours: Blood pressure 99/79, pulse 89, temperature 98.2 F (36.8 C), temperature source Oral, resp. rate 20, height 5' 6"  (1.676 m), weight 140 lb 8 oz (63.73 kg), SpO2 96 %. Awake, alert, appears to have lost weight, respirations not labored seated in bed with [place] [employer] O2. No cough. Repositions easily without assistance. PERRL, not icteric. Oral mucosa moist and clear. No JVD. PAC right chest site unremarkable, not accessed. Lungs with dullness to percussion and absent BS 1/3 way up on left, otherwise no wheezes or rales, no use of accessory muscles Heart RRR, no gallop. Abdomen soft, few BS, not tender, no clear HSM tho central abdomen seems full. LE no edema, cords, tenderness, feet warm. Speech fluent, moves all extremities easily. Psych appropriate mood and affect.    :  Lab Results:  Recent Labs  11/30/15 1036  WBC 5.2  HGB 9.6*  HCT 30.7*  PLT 443*   BMET  Recent Labs  11/30/15 1036  NA 136  K 3.6  CL 103  CO2 25  GLUCOSE 146*  BUN 11  CREATININE 0.91  CALCIUM 8.6*  Remainder of CMET with calcium 8.6, alb 2.5, T prot 8.4, AST 25, ALT 24, AP 106  LDH 126 TSH pending Pleural fluid studies including cytology pending BNP 19.4  Studies/Results: Dg Chest 2 View  11/30/2015  CLINICAL DATA:  Shortness of Breath, history of uterine cancer, post hysterectomy EXAM: CHEST  2 VIEW COMPARISON:  09/27/2014 FINDINGS: Borderline cardiomegaly. There is moderate to large left pleural effusion. Atelectasis or infiltrate is noted in  left lower lobe. No pulmonary edema. Stable right IJ Port-A-Cath position. There is a nodular density in right lower lobe measure about 1.3 cm. Further correlation with CT scan of the chest is recommended. IMPRESSION: There is moderate to large left pleural effusion. Atelectasis or infiltrate is noted in left lower lobe. No pulmonary edema. Stable right IJ Port-A-Cath position. There is a nodular density in right lower lobe measure about 1.3 cm. Further evaluation with CT scan of the chest is recommended. Electronically Signed   By: Lahoma Crocker M.D.   On: 11/30/2015 11:04   Ct Chest W Contrast  11/30/2015  CLINICAL DATA:  Weakness, shortness of breath for 4-5 months, worsening. Intermittent dry cough. History of endometrial carcinoma diagnosed in October of 2015. EXAM: CT CHEST WITH CONTRAST TECHNIQUE: Multidetector CT imaging of the chest was performed during intravenous contrast administration. CONTRAST:  17m ISOVUE-300 IOPAMIDOL (ISOVUE-300) INJECTION 61% COMPARISON:  Chest CT dated 11/04/2014. FINDINGS: Mediastinum/Lymph Nodes: New enlarged and centrally necrotic lymph nodes are seen in the right hilum, measuring 1.8 and 1.3 cm short axis dimension. Probable additional centrally necrotic lymph node is seen within the left lower perihilar region measuring 1.6 cm short axis dimension. Numerous additional lymph nodes are seen within the mediastinum, most prominently in the left paratracheal and aortopulmonary window regions, certainly pathologic by number. Configuration of the thoracic aorta is stable, with associated mild atherosclerotic change. Heart size is normal although displaced to the right by the large left pleural effusion. Lungs/Pleura: There were several small pulmonary nodules identified on chest CT of 11/04/2014, largest measuring 6 mm. There has been marked progression of disease since that previous chest CT. There are multiple new and larger pulmonary nodules/masses within the right lung. This  includes a 3.1 x 2.6 cm mass at the right lung base posteriorly (series 5, image 121), a 1.8 x 1.6 cm nodule within the right lower lobe posteriorly (series 5, image 103), a 1.3 x 1.3 cm nodule within the posterior-lateral portion of the right lower lobe (series 5, image 96), an irregular nodule within the superior segment of the right lower lobe measuring 1.2 x 0.9 cm (series 5, image 79) and a pleural based nodule adjacent to the right major fissure measuring 1.3 x 0.8 cm (series 5, image 67). There is a new large left pleural effusion with adjacent compressive atelectasis. Pleural based mass within the left upper lobe medially measures 2.6 x 1.7 cm (series 5, image 48). There is also irregular pleural thickening along the medial and anterior aspects of the left upper lobe, highly suggestive of neoplastic involvement. Emphysematous changes noted bilaterally, upper lobe predominant. Upper abdomen: Limited images of the upper abdomen are unremarkable. Musculoskeletal: Scattered tiny lucent foci are seen within the thoracic vertebral bodies, too small to definitively characterize, possibly early developing lytic metastases. No definite evidence of osseous metastasis identified. IMPRESSION: 1. Significant progression of disease, as detailed above. This includes numerous pulmonary masses/nodules within the right lung, largest  of which is at the right lung base measuring 3.1 x 2.6 cm. A new pleural based mass is now seen along the medial aspects of the left upper lobe measuring 2.6 x 1.7 cm. On earlier chest CT of 11/04/2014, the largest pulmonary nodule identified within the left lung was 6 mm and the largest pulmonary nodule identified within the right lung was 4 mm. 2. Mediastinal and perihilar lymphadenopathy, most numerous within the left lower paratracheal and aortopulmonary window regions, with largest lymph nodes in the right perihilar region demonstrating evidence of central necrosis, almost certainly  compatible with metastatic lymphadenopathy throughout and further evidence of progression of disease. 3. Large left pleural effusion, likely malignant effusion, with adjacent compressive atelectasis. Heart is displaced to the right by the large left pleural effusion. 4. Emphysematous change, upper lobe predominant. 5. No definite evidence of osseous metastasis. Scattered tiny lucent foci within the thoracic vertebral bodies could conceivably represent early developing metastases. These results were called by telephone at the time of interpretation on 11/30/2015 at 12:39 pm to Dr. Carmin Muskrat , who verbally acknowledged these results. Electronically Signed   By: Franki Cabot M.D.   On: 11/30/2015 12:43   Dg Chest Port 1 View  11/30/2015  CLINICAL DATA:  Status post thoracentesis EXAM: PORTABLE CHEST 1 VIEW COMPARISON:  11/30/2015 FINDINGS: Cardiac shadow is stable. A right chest wall port is noted. The right lung is clear with the exception of a focal nodule projecting over the lung base stable from the prior CT examination. There has been interval it thoracentesis on the left with reduction in the amount of fluid although a large pleural effusion remains. No pneumothorax is noted. IMPRESSION: No evidence of pneumothorax following thoracentesis. A significant left pleural effusion remains. Changes consistent with mass lesion in the right lung base. Electronically Signed   By: Inez Catalina M.D.   On: 11/30/2015 18:43   PACs images reviewed.   DISCUSSION:  Recent history reviewed as above. Patient is more comfortable since thoracentesis. She is aware that this appears to be malignant, which will be confirmed either with pleural fluid cytology or with other tissue if cytology is not diagnostic. This may be metastatic gyn carcinoma, or possibly lung or other primary. She understands that we may not have cytology information before this weekend.   I will get CT AP for further evaluation. Have ordered UA and  urine cytology (previous microscopic hematuria).    Assessment/Plan: 1.large left pleural effusion with pulmonary nodules and chest adenopathy, in patient with long tobacco abuse discontinued ~ 2 months ago, and high grade serous endometrial carcinoma not known recurrent to this point. Pleural fluid cytology pending; if not diagnostic will need other path. Breathing improved since thoracentesis 1.5 liters today 2. High grade serous endometrial carcinoma, at least IIIC at optimal surgery 03-2014 by Dr Jacquelyne Balint, total 6 cycles carboplatin taxol thru 10-2014, and vaginal brachytherapy. Not known recurrent to this point, tho high risk. Last CT AP from this EMR and [facility] was in [facility] 12-2014, so will repeat now. 3.anemia: stable,which has seemed multifactorial to this point, with chemo, iron deficiency, ETOH .No monoclonal protein on SPEP/ IFE 10-2014 or SPEP/ urine 08-2015. Microhematuria: urology evaluation including cystoscopy not remarkable5-2016. FOB - x3 08-2015. Again iron deficient 08-2015.  UA and urine cytology ordered 4.long tobacco, DC'd ~ 09-2015, tho she had previously stopped during treatment of the gyn malignancy 5. Colonoscopy could not evaluate beyond sigmoid, with colon fixed there. ? Related to surgical  adhesions, not clear to me that this would be from vaginal brachytherapy. FOB negative x3 in 08-2015. 6.PAC in 7. weight loss, poor nutritional intake by history.  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 Ascending thoracic aortic aneurysm called on CT 10-2014 10.history anxiety and depression, difficulty tolerating antidepressants 11. Low BP of long duration per outside medical records when I initially met her 12.history ETOH and marijuana 13.mammograms ok other than dense tissue 04-11-15.   Gordy Levan  MD Pager [phone]

## 2015-11-30 NOTE — ED Notes (Signed)
14:35 pt can go up to floor.

## 2015-11-30 NOTE — Telephone Encounter (Signed)
Patient Demographics     Patient Name Sex DOB SSN Address Phone    Sheena Caldwell, Sheena Caldwell Female [birth year] [id number] [place] [address] [phone] (Home) *Preferred* [phone] (Mobile)      Message  Received: Today    Gordy Levan, MD  Baruch Merl, RN           Re patient call and records from PCP: I sent POF now to see her in July with [facility] flush coordinating with that visit   thanks

## 2015-12-01 ENCOUNTER — Other Ambulatory Visit: Payer: Self-pay | Admitting: Oncology

## 2015-12-01 ENCOUNTER — Inpatient Hospital Stay (HOSPITAL_COMMUNITY): Payer: Medicaid Other

## 2015-12-01 ENCOUNTER — Telehealth: Payer: Self-pay | Admitting: *Deleted

## 2015-12-01 ENCOUNTER — Telehealth: Payer: Self-pay | Admitting: Oncology

## 2015-12-01 DIAGNOSIS — I319 Disease of pericardium, unspecified: Secondary | ICD-10-CM

## 2015-12-01 LAB — COMPREHENSIVE METABOLIC PANEL
ALT: 34 U/L (ref 14–54)
AST: 37 U/L (ref 15–41)
Albumin: 2.4 g/dL — ABNORMAL LOW (ref 3.5–5.0)
Alkaline Phosphatase: 113 U/L (ref 38–126)
Anion gap: 6 (ref 5–15)
BUN: 10 mg/dL (ref 6–20)
CO2: 27 mmol/L (ref 22–32)
Calcium: 8.5 mg/dL — ABNORMAL LOW (ref 8.9–10.3)
Chloride: 105 mmol/L (ref 101–111)
Creatinine, Ser: 0.78 mg/dL (ref 0.44–1.00)
GFR calc Af Amer: >60 mL/min (ref >60)
GFR calc non Af Amer: >60 mL/min (ref >60)
Glucose, Bld: 101 mg/dL — ABNORMAL HIGH (ref 65–99)
Potassium: 4 mmol/L (ref 3.5–5.1)
Sodium: 138 mmol/L (ref 135–145)
Total Bilirubin: 0.2 mg/dL — ABNORMAL LOW (ref 0.3–1.2)
Total Protein: 7.8 g/dL (ref 6.5–8.1)

## 2015-12-01 LAB — ECHOCARDIOGRAM COMPLETE
E decel time: 370 msec
E/e' ratio: 4.36
FS: 29 % (ref 28–44)
Height: 66 in
IVS/LV PW RATIO, ED: 0.98
LA ID, A-P, ES: 32 mm
LA diam end sys: 32 mm
LA diam index: 1.81 cm/m2
LA vol A4C: 56.4 ml
LA vol index: 30.7 mL/m2
LA vol: 54.3 mL
LV E/e' medial: 4.36
LV E/e'average: 4.36
LV PW d: 13.3 mm — AB (ref 0.6–1.1)
LV e' LATERAL: 12.8 cm/s
LVOT area: 2.54 cm2
LVOT diameter: 18 mm
Lateral S' vel: 18.1 cm/s
MV Dec: 370
MV pk A vel: 75.7 m/s
MV pk E vel: 55.8 m/s
TDI e' lateral: 12.8
TDI e' medial: 6.42
Weight: 2345.69 oz

## 2015-12-01 LAB — TSH: TSH: 4.131 u[IU]/mL (ref 0.350–4.500)

## 2015-12-01 MED ORDER — SODIUM CHLORIDE 0.9 % IV BOLUS (SEPSIS)
500.0000 mL | Freq: Once | INTRAVENOUS | Status: AC
  Administered 2015-12-01: 500 mL via INTRAVENOUS

## 2015-12-01 MED ORDER — IOPAMIDOL (ISOVUE-300) INJECTION 61%
100.0000 mL | Freq: Once | INTRAVENOUS | Status: AC | PRN
  Administered 2015-12-01: 100 mL via INTRAVENOUS

## 2015-12-01 MED ORDER — ENSURE ENLIVE PO LIQD: 237.0000 mL | Freq: Every day | ORAL | Status: DC | PRN

## 2015-12-01 MED ORDER — BOOST PLUS PO LIQD
237.0000 mL | Freq: Every day | ORAL | Status: DC | PRN
  Filled 2015-12-01: qty 237

## 2015-12-01 MED ORDER — DIATRIZOATE MEGLUMINE & SODIUM 66-10 % PO SOLN
30.0000 mL | Freq: Once | ORAL | Status: AC
  Administered 2015-12-01: 30 mL via ORAL
  Filled 2015-12-01: qty 30

## 2015-12-01 MED ORDER — DIATRIZOATE MEGLUMINE & SODIUM 66-10 % PO SOLN: 15.0000 mL | Freq: Two times a day (BID) | ORAL | Status: DC | PRN

## 2015-12-01 NOTE — Progress Notes (Signed)
Discussed case with Dr. Marko Plume  Prelim results of the cytology shows adenocarcinoma. Given the significant amount of fluid still left we agreed to proceed with pleurex cath for complete drainage. I am not sure if she can tolerate a pleurodesis. She will follow up with Dr. Marko Plume for further treatment of her malignancy and with me for management of the effusion.  Marshell Garfinkel MD [facility] Pager [phone] If no answer or after 3pm call: [phone] 12/01/2015, 4:44 PM

## 2015-12-01 NOTE — Progress Notes (Signed)
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 She had bedside thoracentesis with pulmonary on 6/15.   This AM she is resting comfortably having breakfast, with no complaints of chest pain, cough, shortness of breath.   Assessment/Plan: Active Problems:   Pleural effusion, concern for malignant pleural effusion - path is pending. Echo pending this AM to rule out malignant pericardial effusion, given low voltage EKG.  Endometrial cancer, concern for metastasis - oncology Dr. Marko Plume is following along, has ordered CT Abd/Pelvis   Hypoxia - due to effusion   Cachexia ([place])   Moderate malnutrition ([place])  Code Status: FULL   Family Communication: None present   Disposition Plan: Likely to home once ready for discharge, pending pathology.    Consultants:  Oncology  Pulmonary   Procedures:  Bedside thoracentesis 6/15   Antimicrobials:  None    Objective: Filed Vitals:   11/30/15 2356 12/01/15 0431 12/01/15 0623 12/01/15 0900  BP:  74/51 93/59 83/59   Pulse:  77  93  Temp: 98.9 F (37.2 C) 98.6 F (37 C)  98.3 F (36.8 C)  TempSrc: Oral Oral  Oral  Resp:  18    Height:    5\' 6"  (1.676 m)  Weight:    66.5 kg (146 lb 9.7 oz)  SpO2:  95%  93%    Intake/Output Summary (Last 24 hours) at 12/01/15 1045 Last data filed at 12/01/15 1032  Gross per 24 hour  Intake    620 ml  Output   2050 ml  Net  -1430 ml   Filed Weights   11/30/15 1900 12/01/15 0900  Weight: 63.73 kg (140 lb 8 oz) 66.5 kg (146 lb 9.7 oz)    Exam: General:  Alert, oriented, calm, in no acute distress Eyes: pupils round and  reactive to light and accomodation, clear sclerea Neck: supple, no masses, trachea mildline  Cardiovascular: RRR, no murmurs or rubs, no peripheral edema  Respiratory: clear to auscultation though diminished on left, no wheezes, no crackles  Abdomen: soft, nontender, nondistended, normal bowel tones heard  Skin: dry, no rashes  Musculoskeletal: no joint effusions, normal range of motion  Psychiatric: appropriate affect, normal speech  Neurologic: extraocular muscles intact, clear speech, moving all extremities with intact sensorium    Data Reviewed: CBC:  Recent Labs Lab 11/30/15 1036  WBC 5.2  HGB 9.6*  HCT 30.7*  MCV 93.9  PLT 123456*   Basic Metabolic Panel:  Recent Labs Lab 11/30/15 1036 12/01/15 0442  NA 136 138  K 3.6 4.0  CL 103 105  CO2 25 27  GLUCOSE 146* 101*  BUN 11 10  CREATININE 0.91 0.78  CALCIUM 8.6* 8.5*  MG 1.9  --   PHOS 2.5  --    GFR: Estimated Creatinine Clearance: 68.3 mL/min (by C-G formula based on Cr of 0.78). Liver Function Tests:  Recent Labs Lab 11/30/15 1036 11/30/15 1729 12/01/15 0442  AST 25  --  37  ALT 24  --  34  ALKPHOS 106  --  113  BILITOT 0.4  --  0.2*  PROT 8.4* 8.6* 7.8  ALBUMIN 2.5*  --  2.4*   No  results for input(s): LIPASE, AMYLASE in the last 168 hours. No results for input(s): AMMONIA in the last 168 hours. Coagulation Profile:  Recent Labs Lab 11/30/15 1036  INR 1.31   Cardiac Enzymes:  Recent Labs Lab 11/30/15 1036  TROPONINI <0.03   BNP (last 3 results) No results for input(s): PROBNP in the last 8760 hours. HbA1C: No results for input(s): HGBA1C in the last 72 hours. CBG: No results for input(s): GLUCAP in the last 168 hours. Lipid Profile: No results for input(s): CHOL, HDL, LDLCALC, TRIG, CHOLHDL, LDLDIRECT in the last 72 hours. Thyroid Function Tests:  Recent Labs  12/01/15 0442  TSH 4.131   Anemia Panel: No results for input(s): VITAMINB12, FOLATE, FERRITIN, TIBC, IRON,  RETICCTPCT in the last 72 hours. Urine analysis:    Component Value Date/Time   COLORURINE YELLOW 11/30/2015 2250   APPEARANCEUR CLEAR 11/30/2015 2250   LABSPEC >1.046* 11/30/2015 2250   LABSPEC 1.010 09/07/2015 0918   PHURINE 5.0 11/30/2015 2250   PHURINE 6.5 09/07/2015 0918   GLUCOSEU NEGATIVE 11/30/2015 2250   GLUCOSEU Negative 09/07/2015 0918   HGBUR SMALL* 11/30/2015 2250   HGBUR Moderate 09/07/2015 0918   BILIRUBINUR NEGATIVE 11/30/2015 2250   BILIRUBINUR Negative 09/07/2015 0918   KETONESUR NEGATIVE 11/30/2015 2250   KETONESUR Negative 09/07/2015 0918   PROTEINUR 30* 11/30/2015 2250   PROTEINUR Negative 09/07/2015 0918   UROBILINOGEN 0.2 09/07/2015 0918   UROBILINOGEN 0.2 12/25/2014 1052   NITRITE NEGATIVE 11/30/2015 2250   NITRITE Negative 09/07/2015 0918   LEUKOCYTESUR SMALL* 11/30/2015 2250   LEUKOCYTESUR Negative 09/07/2015 0918   Sepsis Labs: @LABRCNTIP (procalcitonin:4,lacticidven:4)  ) Recent Results (from the past 240 hour(s))  Culture, body fluid-bottle     Status: None (Preliminary result)   Collection Time: 11/30/15  4:40 PM  Result Value Ref Range Status   Specimen Description FLUID PLEURAL LEFT  Final   Special Requests BOTTLES DRAWN AEROBIC ONLY 6CC  Final   Gram Stain   Final    CYTOSPIN SMEAR WBC PRESENT,BOTH PMN AND MONONUCLEAR NO ORGANISMS SEEN Performed at [facility]    Culture PENDING  Incomplete   Report Status PENDING  Incomplete      Studies: Dg Chest 2 View  11/30/2015  CLINICAL DATA:  Shortness of Breath, history of uterine cancer, post hysterectomy EXAM: CHEST  2 VIEW COMPARISON:  09/27/2014 FINDINGS: Borderline cardiomegaly. There is moderate to large left pleural effusion. Atelectasis or infiltrate is noted in left lower lobe. No pulmonary edema. Stable right IJ Port-A-Cath position. There is a nodular density in right lower lobe measure about 1.3 cm. Further correlation with CT scan of the chest is recommended. IMPRESSION:  There is moderate to large left pleural effusion. Atelectasis or infiltrate is noted in left lower lobe. No pulmonary edema. Stable right IJ Port-A-Cath position. There is a nodular density in right lower lobe measure about 1.3 cm. Further evaluation with CT scan of the chest is recommended. Electronically Signed   By: Lahoma Crocker M.D.   On: 11/30/2015 11:04   Ct Chest W Contrast  11/30/2015  CLINICAL DATA:  Weakness, shortness of breath for 4-5 months, worsening. Intermittent dry cough. History of endometrial carcinoma diagnosed in October of 2015. EXAM: CT CHEST WITH CONTRAST TECHNIQUE: Multidetector CT imaging of the chest was performed during intravenous contrast administration. CONTRAST:  46mL ISOVUE-300 IOPAMIDOL (ISOVUE-300) INJECTION 61% COMPARISON:  Chest CT dated 11/04/2014. FINDINGS: Mediastinum/Lymph Nodes: New enlarged and centrally necrotic lymph nodes are seen in the right hilum, measuring 1.8  and 1.3 cm short axis dimension. Probable additional centrally necrotic lymph node is seen within the left lower perihilar region measuring 1.6 cm short axis dimension. Numerous additional lymph nodes are seen within the mediastinum, most prominently in the left paratracheal and aortopulmonary window regions, certainly pathologic by number. Configuration of the thoracic aorta is stable, with associated mild atherosclerotic change. Heart size is normal although displaced to the right by the large left pleural effusion. Lungs/Pleura: There were several small pulmonary nodules identified on chest CT of 11/04/2014, largest measuring 6 mm. There has been marked progression of disease since that previous chest CT. There are multiple new and larger pulmonary nodules/masses within the right lung. This includes a 3.1 x 2.6 cm mass at the right lung base posteriorly (series 5, image 121), a 1.8 x 1.6 cm nodule within the right lower lobe posteriorly (series 5, image 103), a 1.3 x 1.3 cm nodule within the  posterior-lateral portion of the right lower lobe (series 5, image 96), an irregular nodule within the superior segment of the right lower lobe measuring 1.2 x 0.9 cm (series 5, image 79) and a pleural based nodule adjacent to the right major fissure measuring 1.3 x 0.8 cm (series 5, image 67). There is a new large left pleural effusion with adjacent compressive atelectasis. Pleural based mass within the left upper lobe medially measures 2.6 x 1.7 cm (series 5, image 48). There is also irregular pleural thickening along the medial and anterior aspects of the left upper lobe, highly suggestive of neoplastic involvement. Emphysematous changes noted bilaterally, upper lobe predominant. Upper abdomen: Limited images of the upper abdomen are unremarkable. Musculoskeletal: Scattered tiny lucent foci are seen within the thoracic vertebral bodies, too small to definitively characterize, possibly early developing lytic metastases. No definite evidence of osseous metastasis identified. IMPRESSION: 1. Significant progression of disease, as detailed above. This includes numerous pulmonary masses/nodules within the right lung, largest of which is at the right lung base measuring 3.1 x 2.6 cm. A new pleural based mass is now seen along the medial aspects of the left upper lobe measuring 2.6 x 1.7 cm. On earlier chest CT of 11/04/2014, the largest pulmonary nodule identified within the left lung was 6 mm and the largest pulmonary nodule identified within the right lung was 4 mm. 2. Mediastinal and perihilar lymphadenopathy, most numerous within the left lower paratracheal and aortopulmonary window regions, with largest lymph nodes in the right perihilar region demonstrating evidence of central necrosis, almost certainly compatible with metastatic lymphadenopathy throughout and further evidence of progression of disease. 3. Large left pleural effusion, likely malignant effusion, with adjacent compressive atelectasis. Heart is  displaced to the right by the large left pleural effusion. 4. Emphysematous change, upper lobe predominant. 5. No definite evidence of osseous metastasis. Scattered tiny lucent foci within the thoracic vertebral bodies could conceivably represent early developing metastases. These results were called by telephone at the time of interpretation on 11/30/2015 at 12:39 pm to Dr. Carmin Muskrat , who verbally acknowledged these results. Electronically Signed   By: Franki Cabot M.D.   On: 11/30/2015 12:43   Dg Chest Port 1 View  11/30/2015  CLINICAL DATA:  Status post thoracentesis EXAM: PORTABLE CHEST 1 VIEW COMPARISON:  11/30/2015 FINDINGS: Cardiac shadow is stable. A right chest wall port is noted. The right lung is clear with the exception of a focal nodule projecting over the lung base stable from the prior CT examination. There has been interval it thoracentesis on the left  with reduction in the amount of fluid although a large pleural effusion remains. No pneumothorax is noted. IMPRESSION: No evidence of pneumothorax following thoracentesis. A significant left pleural effusion remains. Changes consistent with mass lesion in the right lung base. Electronically Signed   By: Inez Catalina M.D.   On: 11/30/2015 18:43    Scheduled Meds: . diatrizoate meglumine-sodium  30 mL Oral Once  . docusate sodium  100 mg Oral Q12H  . enoxaparin (LOVENOX) injection  40 mg Subcutaneous Q24H  . ferrous sulfate  325 mg Oral Daily  . fludrocortisone  0.05 mg Oral Daily  . mirtazapine  30 mg Oral QHS  . multivitamin with minerals  1 tablet Oral Daily    Continuous Infusions:    LOS: 1 day   Time spent: 27 minutes  Jleigh Striplin Marry Guan, MD [facility] Pager [phone]  If 7PM-7AM, please contact night-coverage [email or web address] Password [email or web address] 12/01/2015, 10:45 AM

## 2015-12-01 NOTE — Progress Notes (Signed)
Pt unable to be seen today as she was down at CT scan Chart reviewed  Pleural fluid is exudative. Gram stain is negative.  Cytology is still pending We will follow when the final results are in.  Marshell Garfinkel MD [facility] Pager [phone] If no answer or after 3pm call: [phone] 12/01/2015, 11:32 AM

## 2015-12-01 NOTE — Progress Notes (Signed)
  Echocardiogram 2D Echocardiogram has been performed.  Diamond Nickel 12/01/2015, 3:59 PM

## 2015-12-01 NOTE — Progress Notes (Signed)
Initial Nutrition Assessment  DOCUMENTATION CODES:   Non-severe (moderate) malnutrition in context of chronic illness  INTERVENTION:  - Will order Ensure Enlive once/day PRN, this supplement provides 350 kcal and 20 grams of protein - Will order Boost Plus once/day PRN, this supplement provides 360 kcal and 14 grams of protein. - Encourage PO intakes of meals. - RD will continue to monitor for additional needs.  NUTRITION DIAGNOSIS:   Increased nutrient needs related to catabolic illness, cancer and cancer related treatments as evidenced by estimated needs.  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 According to the patient, she has been feeling progressively weak over the last 5 months. Over the last 2 months, patient has become progressively SOB necessitating ER visit today. No fever, no headache, no neck pain, no GI symptoms and no urinary symptoms. CT Scan of the Chest revealed significant left pleural effusion with multiple nodules.   Pt seen for consult. BMI indicates normal weight. Per notes, pt had thoracentesis yesterday with 1.5 L removed. Per chart review, pt ate 80% of breakfast this AM which she reports was breakfast potatoes and [place] toast. Pt preparing to order lunch at time of RD visit.   Pt states that in the few months PTA she was prescribed antidepressant medications; pt with many comments concerning these medications. Overall, she states that being on these medications led to a loss of appetite and that she was not eating well most days. Pt has recently stopped taking 2 of the 3 antidepressants previously prescribed and plans to stop taking the third one as  well. Pt states she was drinking Boost PTA when appetite and intakes were poor. Pt feeling appetite and intakes are improved since hospitalization; she is agreeable to PRN nutrition supplements and understands she can ask RN for Ensure or Boost if she feels intakes are inadequate.   Unable to have indepth nutrition-related discussion as pt prefers to talk about current medical course and concerns about care PTA. Physical assessment shows mild fat and moderate muscle wasting to upper body, did not assess lower body during this visit. Chart review indicates stable weight (140-146 lbs) over the past 1 year.   Notes indicate that pt finished chemo in May 2017 and plan to start radiation in July 2017. Medications reviewed; daily multivitamin with minerals. Labs reviewed; Ca: 8.5 mg/dL.   Diet Order:  Diet Heart Room service appropriate?: Yes; Fluid consistency:: Thin  Skin:  Reviewed, no issues  Last BM:  PTA  Height:   Ht Readings from Last 1 Encounters:  12/01/15 5\' 6"  (1.676 m)    Weight:   Wt Readings from Last 1 Encounters:  12/01/15 146 lb 9.7 oz (66.5 kg)    Ideal Body Weight:  59.09 kg (kg)  BMI:  Body mass index is 23.67 kg/(m^2).  Estimated Nutritional Needs:   Kcal:  1900-2100  Protein:  80-90 grams  Fluid:  2 L/day  EDUCATION NEEDS:   No education needs identified at this time     Jarome Matin, MS, RD, LDN Inpatient Clinical Dietitian Pager # [phone] After hours/weekend pager # [phone]

## 2015-12-01 NOTE — Care Management Note (Signed)
Case Management Note  Patient Details  Name: Sheena Caldwell MRN: [id number] Date of Birth: 1950-08-25  Subjective/Objective:65 y/o m admitted w/pleural effusion.From home.                    Action/Plan:d/c plan home.   Expected Discharge Date:   (unknown)               Expected Discharge Plan:  Home/Self Care  In-House Referral:     Discharge planning Services  CM Consult  Post Acute Care Choice:    Choice offered to:     DME Arranged:    DME Agency:     HH Arranged:    HH Agency:     Status of Service:  In process, will continue to follow  Medicare Important Message Given:    Date Medicare IM Given:    Medicare IM give by:    Date Additional Medicare IM Given:    Additional Medicare Important Message give by:     If discussed at [place] of Stay Meetings, dates discussed:    Additional Comments:  Dessa Phi, RN 12/01/2015, 3:04 PM

## 2015-12-01 NOTE — Telephone Encounter (Signed)
Pt will get sched in hospital per pof

## 2015-12-01 NOTE — Telephone Encounter (Signed)
MEDICAL ONCOLOGY  May be preliminary on pleural fluid cytology available today, path to contact me if so  L.Marko Plume, MD

## 2015-12-01 NOTE — Telephone Encounter (Signed)
Per staff message and POF I have scheduled appts. Advised scheduler of appts. JMW  

## 2015-12-01 NOTE — Progress Notes (Signed)
MEDICAL ONCOLOGY December 01, 2015, 12:01 PM  Hospital day 2 Antibiotics: none Chemotherapy: carboplatin taxol x 6 thru 10-21-2014 for IIIC high grade serous endometrial carcinoma.  Outpatient Physicians: Everitt Amber, Dorann Ou Hedgecock(PCP, [facility] [employer]), _ [name] [facility]); Louis Meckel ([facility]), [name] , Owens Loffler ([facility]), L.Marko Plume  I spoke with pathologist just prior to seeing patient. Pleural fluid cytology does have malignant cells which appear to be adenocarcinoma. Additional studies pending to determine if this is metastatic endometrial carcinoma or other. That information is not expected prior to at least next week.   Staging CT AP done this AM, however had not been read at time of visit.   EMR reviewed. Patient seen, no visitors here presently   Subjective: Did not sleep much last pm with nursing interventions for low BP, patient not symptomatic lying in bed with that. She does not feel SOB off of O2 now. Some cough, NP. No chest pain or other pain. She has eaten some, encouraged. Bowels have not moved. No chest pain. Getting up to BR to void, does not need assistance but is very compliant calling staff to be with her when up. No new or different symptoms otherwise.    Objective: Vital signs in last 24 hours: Blood pressure 83/59, pulse 93, temperature 98.3 F (36.8 C), temperature source Oral, resp. rate 18, height 5' 6"  (1.676 m), weight 146 lb 9.7 oz (66.5 kg), SpO2 93 %.   Intake/Output from previous day: 06/15 0701 - 06/16 0700 In: 360 [P.O.:360] Out: 1150 [Urine:1150] Intake/Output this shift: Total I/O In: 260 [P.O.:260] Out: 900 [Urine:900]  Physical exam: Awake, alert, appears comfortable lying flat on right side in bed, then ambulated slowly to BR as I was leaving. Respirations not labored RA. Oral mucosa clear. Lungs with absent BS and dullness 1/3 way up left, otherwise  clear. Bandaid at site of thoracentesis, no leakage. No use of accessory muscles. No JVD. Heart sounds clear, no gallop. PAC not accesses, site ok. Peripheral IV locked. Abdomen soft, not tender, no increased distension, few BS. LE no pitting edema, cords, tenderness.  Lab Results:  Recent Labs  11/30/15 1036  WBC 5.2  HGB 9.6*  HCT 30.7*  PLT 443*   BMET  Recent Labs  11/30/15 1036 12/01/15 0442  NA 136 138  K 3.6 4.0  CL 103 105  CO2 25 27  GLUCOSE 146* 101*  BUN 11 10  CREATININE 0.91 0.78  CALCIUM 8.6* 8.5*    Studies/Results: Dg Chest 2 View  11/30/2015  CLINICAL DATA:  Shortness of Breath, history of uterine cancer, post hysterectomy EXAM: CHEST  2 VIEW COMPARISON:  09/27/2014 FINDINGS: Borderline cardiomegaly. There is moderate to large left pleural effusion. Atelectasis or infiltrate is noted in left lower lobe. No pulmonary edema. Stable right IJ Port-A-Cath position. There is a nodular density in right lower lobe measure about 1.3 cm. Further correlation with CT scan of the chest is recommended. IMPRESSION: There is moderate to large left pleural effusion. Atelectasis or infiltrate is noted in left lower lobe. No pulmonary edema. Stable right IJ Port-A-Cath position. There is a nodular density in right lower lobe measure about 1.3 cm. Further evaluation with CT scan of the chest is recommended. Electronically Signed   By: Lahoma Crocker M.D.   On: 11/30/2015 11:04   Ct Chest W Contrast  11/30/2015  CLINICAL DATA:  Weakness, shortness of breath for 4-5 months, worsening. Intermittent dry  cough. History of endometrial carcinoma diagnosed in October of 2015. EXAM: CT CHEST WITH CONTRAST TECHNIQUE: Multidetector CT imaging of the chest was performed during intravenous contrast administration. CONTRAST:  37m ISOVUE-300 IOPAMIDOL (ISOVUE-300) INJECTION 61% COMPARISON:  Chest CT dated 11/04/2014. FINDINGS: Mediastinum/Lymph Nodes: New enlarged and centrally necrotic lymph nodes are  seen in the right hilum, measuring 1.8 and 1.3 cm short axis dimension. Probable additional centrally necrotic lymph node is seen within the left lower perihilar region measuring 1.6 cm short axis dimension. Numerous additional lymph nodes are seen within the mediastinum, most prominently in the left paratracheal and aortopulmonary window regions, certainly pathologic by number. Configuration of the thoracic aorta is stable, with associated mild atherosclerotic change. Heart size is normal although displaced to the right by the large left pleural effusion. Lungs/Pleura: There were several small pulmonary nodules identified on chest CT of 11/04/2014, largest measuring 6 mm. There has been marked progression of disease since that previous chest CT. There are multiple new and larger pulmonary nodules/masses within the right lung. This includes a 3.1 x 2.6 cm mass at the right lung base posteriorly (series 5, image 121), a 1.8 x 1.6 cm nodule within the right lower lobe posteriorly (series 5, image 103), a 1.3 x 1.3 cm nodule within the posterior-lateral portion of the right lower lobe (series 5, image 96), an irregular nodule within the superior segment of the right lower lobe measuring 1.2 x 0.9 cm (series 5, image 79) and a pleural based nodule adjacent to the right major fissure measuring 1.3 x 0.8 cm (series 5, image 67). There is a new large left pleural effusion with adjacent compressive atelectasis. Pleural based mass within the left upper lobe medially measures 2.6 x 1.7 cm (series 5, image 48). There is also irregular pleural thickening along the medial and anterior aspects of the left upper lobe, highly suggestive of neoplastic involvement. Emphysematous changes noted bilaterally, upper lobe predominant. Upper abdomen: Limited images of the upper abdomen are unremarkable. Musculoskeletal: Scattered tiny lucent foci are seen within the thoracic vertebral bodies, too small to definitively characterize, possibly  early developing lytic metastases. No definite evidence of osseous metastasis identified. IMPRESSION: 1. Significant progression of disease, as detailed above. This includes numerous pulmonary masses/nodules within the right lung, largest of which is at the right lung base measuring 3.1 x 2.6 cm. A new pleural based mass is now seen along the medial aspects of the left upper lobe measuring 2.6 x 1.7 cm. On earlier chest CT of 11/04/2014, the largest pulmonary nodule identified within the left lung was 6 mm and the largest pulmonary nodule identified within the right lung was 4 mm. 2. Mediastinal and perihilar lymphadenopathy, most numerous within the left lower paratracheal and aortopulmonary window regions, with largest lymph nodes in the right perihilar region demonstrating evidence of central necrosis, almost certainly compatible with metastatic lymphadenopathy throughout and further evidence of progression of disease. 3. Large left pleural effusion, likely malignant effusion, with adjacent compressive atelectasis. Heart is displaced to the right by the large left pleural effusion. 4. Emphysematous change, upper lobe predominant. 5. No definite evidence of osseous metastasis. Scattered tiny lucent foci within the thoracic vertebral bodies could conceivably represent early developing metastases. These results were called by telephone at the time of interpretation on 11/30/2015 at 12:39 pm to Dr. RCarmin Muskrat, who verbally acknowledged these results. Electronically Signed   By: SFranki CabotM.D.   On: 11/30/2015 12:43   Ct Abdomen Pelvis W Contrast  12/01/2015  CLINICAL DATA:  Uterine cancer diagnosed in 2015. Chemotherapy completed May 2016 and radiation therapy in August 2016. Pleural effusion and pulmonary nodularity on chest CT. EXAM: CT ABDOMEN AND PELVIS WITH CONTRAST TECHNIQUE: Multidetector CT imaging of the abdomen and pelvis was performed using the standard protocol following bolus administration  of intravenous contrast. CONTRAST:  151m ISOVUE-300 IOPAMIDOL (ISOVUE-300) INJECTION 61% COMPARISON:  Chest CT 11/30/2015.  Abdominal pelvic CT 12/25/2014 FINDINGS: Lower chest: As seen on yesterday's chest CT, there is a large left pleural effusion with several pleural-based nodules in the left hemithorax consistent with metastatic disease. These measure up to 1.4 cm on image 10. There is no significant pleural fluid on the right. There are right lower lobe pulmonary nodules measuring up to 10 x 11 mm on image number 5 and 2.9 x 4.4 cm on image number 40. The left lower lobe remains collapsed. There is a probable 1.6 cm left lower lobe nodule on image 11. Right infrahilar adenopathy is partially imaged. Hepatobiliary: There is stable low-density in the left hepatic lobe adjacent to the falciform ligament on image 40, likely focal fat. No new or suspicious hepatic findings. No evidence of gallstones, gallbladder wall thickening or biliary dilatation. Pancreas: Unremarkable. No pancreatic ductal dilatation or surrounding inflammatory changes. Spleen: Normal in size without focal abnormality. Adrenals/Urinary Tract: Both adrenal glands appear normal. Grossly stable bilateral renal cysts. No evidence of renal mass, urinary tract calculus or hydronephrosis. The bladder appears unremarkable. Stomach/Bowel: No evidence of bowel wall thickening, distention or surrounding inflammatory change. Moderate stool throughout the colon. Vascular/Lymphatic: There is a new mildly enlarged left periaortic node measuring 12 mm on image 48. There is a large nodal mass along the left pelvic sidewall, measuring 6.2 x 2.9 cm on image 73. There is aortic and branch vessel atherosclerosis. No evidence of large vessel occlusion. Reproductive: Hysterectomy.  No evidence of adnexal mass. Other: No ascites or peritoneal nodularity. Musculoskeletal: No acute or significant osseous findings. IMPRESSION: 1. As seen on yesterday's chest CT, there  is metastatic disease to both lung bases with a large malignant left pleural effusion. There are several pleural-based metastases on the left and right infrahilar lymphadenopathy, incompletely visualized. 2. Left periaortic and left pelvic side wall lymphadenopathy consistent with metastatic disease. No other evidence of abdominal pelvic metastatic disease. 3. No hydronephrosis. Electronically Signed   By: WRichardean SaleM.D.   On: 12/01/2015 11:59   Dg Chest Port 1 View  11/30/2015  CLINICAL DATA:  Status post thoracentesis EXAM: PORTABLE CHEST 1 VIEW COMPARISON:  11/30/2015 FINDINGS: Cardiac shadow is stable. A right chest wall port is noted. The right lung is clear with the exception of a focal nodule projecting over the lung base stable from the prior CT examination. There has been interval it thoracentesis on the left with reduction in the amount of fluid although a large pleural effusion remains. No pneumothorax is noted. IMPRESSION: No evidence of pneumothorax following thoracentesis. A significant left pleural effusion remains. Changes consistent with mass lesion in the right lung base. Electronically Signed   By: MInez CatalinaM.D.   On: 11/30/2015 18:43    DISCUSSION Preliminary pathology information discussed with patient, who understands that additional pathology information will be available in next several days. Adenocarcinoma would be consistent with known endometrial cancer, tho possibly a different primary.   Discussed close observation vs pleurex drain/ sclerosis of the malignant pleural effusion. Not clear how quickly this developed, and certainly likely to reaccumulate until/ unless  effective systemic treatment. I will let pulmonary consultants and hospitalist service know path findings.  Patient understands that metastatic cancer with malignant pleural effusion is not curable and that treatment will be in attempt to improve and control disease. If this is metastatic from the  endometrial cancer, retreatment with taxane Norma Fredrickson would be consideration, as she is out 13 months from last treatment with those agents. Other agents including avastin would be consideration.  If she needs pleural drain or chest tube, doing that sooner rather than waiting would allow [place] to add avastin  ~4 weeks out from procedure.   Patient understands discussion. I have sent information to gyn oncologist Dr Jacquelyne Balint to keep her updated. All questions answered.   CT AP available after visit consistent with progressive gyn cancer left periaortic and left pelvic sidewall.   I will [facility] her back at [facility] on 12-15-15 with labs.  I will be away after today until 12-11-15, however my partners are available to assist if needed next week.    Assessment/Plan: 1.large malignant left pleural effusion with pulmonary nodules and chest adenopathy: appears to be adenocarcinoma from initial cytology review, with cell block and confirmatory stains pending.  Likely metastatic endometrial cancer given adenocarcinoma with her history and findings now on CT AP, but also long smoker etc. Breathing improved since thoracentesis 1.5 liters on 11-30-15. May need drainage with sclerosis, vs close follow up- appreciate pulmonary helping. 2. High grade serous endometrial carcinoma, at least IIIC at optimal surgery 03-2014 by Dr Jacquelyne Balint, total 6 cycles carboplatin taxol thru 10-2014, and vaginal brachytherapy. Findings on CT AP consistent with recurrent disease paraaortic and left pelvic sidewall, not symptomatic. Will begin systemic treatment after full path available. Consider retreatment with carbo taxane +/- avastin. Timing of avastin would depend on management of the malignant pleural effusion.  3.anemia: stable,which has seemed multifactorial to this point, with chemo, iron deficiency, ETOH .No monoclonal protein on SPEP/ IFE 10-2014 or SPEP/ urine 08-2015. Microhematuria: urology evaluation including  cystoscopy not remarkable5-2016. FOB - x3 08-2015. Again iron deficient 08-2015. UA noted, and urine cytology pending 4.long tobacco, DC'd ~ 09-2015, tho she had previously stopped during treatment of the gyn malignancy 5. Colonoscopy could not evaluate beyond sigmoid, with colon fixed there. ? Related to surgical adhesions, not clear to me that this would be from vaginal brachytherapy. FOB negative x3 in 08-2015. 6.PAC in 7. weight loss, poor nutritional intake by history.  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 Ascending thoracic aortic aneurysm called on CT 10-2014 10.history anxiety and depression, difficulty tolerating antidepressants 11. Low BP of long duration per outside medical records when I initially met her. Recommendations from primary service would be useful 12.history ETOH and marijuana 13.mammograms ok other than dense tissue 04-11-15.   [place] Pager [phone] Office [phone]

## 2015-12-02 ENCOUNTER — Inpatient Hospital Stay (HOSPITAL_COMMUNITY): Payer: Medicaid Other

## 2015-12-02 LAB — BASIC METABOLIC PANEL
ANION GAP: 6 (ref 5–15)
BUN: 9 mg/dL (ref 6–20)
CALCIUM: 8.3 mg/dL — AB (ref 8.9–10.3)
CO2: 27 mmol/L (ref 22–32)
CREATININE: 0.68 mg/dL (ref 0.44–1.00)
Chloride: 103 mmol/L (ref 101–111)
Glucose, Bld: 102 mg/dL — ABNORMAL HIGH (ref 65–99)
Potassium: 4 mmol/L (ref 3.5–5.1)
SODIUM: 136 mmol/L (ref 135–145)

## 2015-12-02 LAB — CBC
HEMATOCRIT: 28.6 % — AB (ref 36.0–46.0)
Hemoglobin: 9.2 g/dL — ABNORMAL LOW (ref 12.0–15.0)
MCH: 29.6 pg (ref 26.0–34.0)
MCHC: 32.2 g/dL (ref 30.0–36.0)
MCV: 92 fL (ref 78.0–100.0)
PLATELETS: 409 10*3/uL — AB (ref 150–400)
RBC: 3.11 MIL/uL — ABNORMAL LOW (ref 3.87–5.11)
RDW: 17.2 % — AB (ref 11.5–15.5)
WBC: 5.7 10*3/uL (ref 4.0–10.5)

## 2015-12-02 LAB — PROCALCITONIN: PROCALCITONIN: 0.24 ng/mL

## 2015-12-02 NOTE — Progress Notes (Signed)
IR aware of request for PleurX for malignant left effusion Chart reviewed, recent thora by PCCM on 6/15, 1.5L removed.  Agree allow a couple more days for adequate fluid to accumulate and likely proceed with PleurX on Mon. IR will come to see pt this weekend to discuss procedure.  Ascencion Dike PA-C Interventional Radiology 12/02/2015 12:13 PM

## 2015-12-02 NOTE — Progress Notes (Signed)
PROGRESS NOTE  Sheena Caldwell [id number] DOB: 09-08-1950 DOA: 11/30/2015 PCP: Ardith Dark, PA-C  HPI/Recap of past 24 hours: Pleasant 65 yo female with prior history of treated stage IIIC high grade serous endometrial cancer S/P surgery and adjuvant chemotherapy with no known recurrence who was admitted for SOB, found to have large left pleural effusion and multiple pulmonary nodules on CT scan of the chest. She had bedside thoracentesis with pulmonary on 6/15, with 1500cc of cloudy fluid obtained.   This AM she is resting comfortably having breakfast, with no complaints of chest pain, cough, shortness of breath. We were hoping to get Pleurex and send her home, but unfortunately IR is not here over the weekend.  Assessment/Plan: Pleural effusion, concern for malignant pleural effusion - path c/w adenocarcinoma, discussed with Dr. Ashok Cordia this AM who is on call for pulmonology. Does not recommend bedside thoracentesis today, recommends Pleurex when able. IR not here until Monday. I think safest plan is for patient to await Pleurex Monday, as we do not know how quickly her malignant effusion might recur. CXR this AM shows stable effusion.  Endometrial cancer, concern for metastasis - oncology Dr. Marko Plume is following along, has ordered CT Abd/Pelvis. Discussed plan with her on 6/16  Hypoxia - due to effusion  Cachexia ([place])  Moderate malnutrition ([place])  Code Status: FULL   Family Communication: None present   Disposition Plan: Likely to home Monday 6/19.    Consultants:  Oncology  Pulmonary   Procedures:  Bedside thoracentesis 6/15   Antimicrobials:  None    Objective: Filed Vitals:   12/01/15 0900 12/01/15 1342 12/01/15 2107 12/02/15 0500  BP: 83/59 90/66 90/56  88/65  Pulse: 93 87 90 88  Temp: 98.3 F (36.8 C) 99.6 F (37.6 C) 98.5 F (36.9 C) 98.5 F (36.9 C)  TempSrc: Oral Oral Oral Oral  Resp:  18 17 18   Height: 5\' 6"  (1.676 m)     Weight: 66.5 kg  (146 lb 9.7 oz)     SpO2: 93% 93% 91% 92%    Intake/Output Summary (Last 24 hours) at 12/02/15 1207 Last data filed at 12/02/15 1004  Gross per 24 hour  Intake    480 ml  Output   1100 ml  Net   -620 ml   Filed Weights   11/30/15 1900 12/01/15 0900  Weight: 63.73 kg (140 lb 8 oz) 66.5 kg (146 lb 9.7 oz)    Exam: General:  Alert, oriented, calm, in no acute distress Eyes: pupils round and reactive to light and accomodation, clear sclerea Neck: supple, no masses, trachea mildline  Cardiovascular: RRR, no murmurs or rubs, no peripheral edema  Respiratory: clear to auscultation though diminished on left, no wheezes, no crackles  Abdomen: soft, nontender, nondistended, normal bowel tones heard  Skin: dry, no rashes  Musculoskeletal: no joint effusions, normal range of motion  Psychiatric: appropriate affect, normal speech  Neurologic: extraocular muscles intact, clear speech, moving all extremities with intact sensorium    Data Reviewed: CBC:  Recent Labs Lab 11/30/15 1036 12/02/15 0517  WBC 5.2 5.7  HGB 9.6* 9.2*  HCT 30.7* 28.6*  MCV 93.9 92.0  PLT 443* [id number]*   Basic Metabolic Panel:  Recent Labs Lab 11/30/15 1036 12/01/15 0442 12/02/15 0517  NA 136 138 136  K 3.6 4.0 4.0  CL 103 105 103  CO2 25 27 27   GLUCOSE 146* 101* 102*  BUN 11 10 9   CREATININE 0.91 0.78 0.68  CALCIUM 8.6* 8.5* 8.3*  MG  1.9  --   --   PHOS 2.5  --   --    GFR: Estimated Creatinine Clearance: 68.3 mL/min (by C-G formula based on Cr of 0.68). Liver Function Tests:  Recent Labs Lab 11/30/15 1036 11/30/15 1729 12/01/15 0442  AST 25  --  37  ALT 24  --  34  ALKPHOS 106  --  113  BILITOT 0.4  --  0.2*  PROT 8.4* 8.6* 7.8  ALBUMIN 2.5*  --  2.4*   No results for input(s): LIPASE, AMYLASE in the last 168 hours. No results for input(s): AMMONIA in the last 168 hours. Coagulation Profile:  Recent Labs Lab 11/30/15 1036  INR 1.31   Cardiac Enzymes:  Recent Labs Lab  11/30/15 1036  TROPONINI <0.03   BNP (last 3 results) No results for input(s): PROBNP in the last 8760 hours. HbA1C: No results for input(s): HGBA1C in the last 72 hours. CBG: No results for input(s): GLUCAP in the last 168 hours. Lipid Profile: No results for input(s): CHOL, HDL, LDLCALC, TRIG, CHOLHDL, LDLDIRECT in the last 72 hours. Thyroid Function Tests:  Recent Labs  12/01/15 0442  TSH 4.131   Anemia Panel: No results for input(s): VITAMINB12, FOLATE, FERRITIN, TIBC, IRON, RETICCTPCT in the last 72 hours. Urine analysis:    Component Value Date/Time   COLORURINE YELLOW 11/30/2015 2250   APPEARANCEUR CLEAR 11/30/2015 2250   LABSPEC >1.046* 11/30/2015 2250   LABSPEC 1.010 09/07/2015 0918   PHURINE 5.0 11/30/2015 2250   PHURINE 6.5 09/07/2015 0918   GLUCOSEU NEGATIVE 11/30/2015 2250   GLUCOSEU Negative 09/07/2015 0918   HGBUR SMALL* 11/30/2015 2250   HGBUR Moderate 09/07/2015 0918   BILIRUBINUR NEGATIVE 11/30/2015 2250   BILIRUBINUR Negative 09/07/2015 0918   KETONESUR NEGATIVE 11/30/2015 2250   KETONESUR Negative 09/07/2015 0918   PROTEINUR 30* 11/30/2015 2250   PROTEINUR Negative 09/07/2015 0918   UROBILINOGEN 0.2 09/07/2015 0918   UROBILINOGEN 0.2 12/25/2014 1052   NITRITE NEGATIVE 11/30/2015 2250   NITRITE Negative 09/07/2015 0918   LEUKOCYTESUR SMALL* 11/30/2015 2250   LEUKOCYTESUR Negative 09/07/2015 0918   Sepsis Labs: @LABRCNTIP (procalcitonin:4,lacticidven:4)  ) Recent Results (from the past 240 hour(s))  Culture, body fluid-bottle     Status: None (Preliminary result)   Collection Time: 11/30/15  4:40 PM  Result Value Ref Range Status   Specimen Description FLUID PLEURAL LEFT  Final   Special Requests BOTTLES DRAWN AEROBIC ONLY 6CC  Final   Gram Stain   Final    CYTOSPIN SMEAR WBC PRESENT,BOTH PMN AND MONONUCLEAR NO ORGANISMS SEEN    Culture   Final    NO GROWTH < 24 HOURS Performed at [facility]    Report Status PENDING   Incomplete      Studies: Dg Chest 2 View  12/02/2015  CLINICAL DATA:  Pleural effusion.  Endometrial carcinoma. EXAM: CHEST  2 VIEW COMPARISON:  11/30/2015 chest radiograph. FINDINGS: Right internal jugular MediPort terminates in the lower third of the superior vena cava. Stable cardiomediastinal silhouette with mild cardiomegaly. No pneumothorax. No right pleural effusion. Moderate to large left pleural effusion is stable. No pulmonary edema. Complete lingular and left lower lobe atelectasis. Re- demonstration of multiple bilateral pulmonary nodules, unchanged. IMPRESSION: 1. Stable moderate to large left pleural effusion with complete lingular and left lower lobe atelectasis. 2. Re- demonstration of multiple bilateral pulmonary nodules consistent with pulmonary metastases. Electronically Signed   By: Ilona Sorrel M.D.   On: 12/02/2015 10:27    Scheduled Meds: .  docusate sodium  100 mg Oral Q12H  . enoxaparin (LOVENOX) injection  40 mg Subcutaneous Q24H  . ferrous sulfate  325 mg Oral Daily  . fludrocortisone  0.05 mg Oral Daily  . mirtazapine  30 mg Oral QHS  . multivitamin with minerals  1 tablet Oral Daily    Continuous Infusions:    LOS: 2 days   Time spent: 26 minutes  Mir Marry Guan, MD [facility] Pager [phone]  If 7PM-7AM, please contact night-coverage [email or web address] Password [facility] 12/02/2015, 12:07 PM

## 2015-12-02 NOTE — Progress Notes (Signed)
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 She had bedside thoracentesis with pulmonary on 6/15, with 1500cc of cloudy fluid obtained.   This AM she is resting comfortably having breakfast, with no complaints of chest pain, cough, shortness of breath.   Assessment/Plan: Active Problems:   Pleural effusion, concern for malignant pleural effusion - path c/w adenocarcinoma, discussed with Dr. Ashok Cordia this AM who is on call for pulmonology. Does not recommend bedside thoracentesis today, recommends Pleurex when able. IR not here until Monday. I think safest plan is for patient to await Pleurex Monday, as we do not know how quickly her malignant effusion might recur. CXR this AM shows stable effusion.  Endometrial cancer, concern for metastasis - oncology Dr. Marko Plume is following along, has ordered CT Abd/Pelvis. Discussed plan with her on 6/16   Hypoxia - due to effusion   Cachexia ([place])   Moderate malnutrition ([place])  Code Status: FULL   Family Communication: None present   Disposition Plan: Likely to home Monday 6/19.    Consultants:  Oncology  Pulmonary   Procedures:  Bedside thoracentesis 6/15   Antimicrobials:  None    Objective: Filed Vitals:   12/01/15 0900 12/01/15 1342 12/01/15 2107 12/02/15 0500  BP: 83/59 90/66 90/56  88/65  Pulse: 93 87 90 88  Temp: 98.3 F (36.8 C) 99.6 F (37.6 C) 98.5 F (36.9 C) 98.5 F (36.9 C)  TempSrc: Oral Oral Oral Oral  Resp:  18 17 18   Height: 5\' 6"  (1.676 m)     Weight: 66.5 kg (146 lb 9.7 oz)     SpO2: 93% 93% 91% 92%    Intake/Output Summary (Last 24  hours) at 12/02/15 1157 Last data filed at 12/02/15 1004  Gross per 24 hour  Intake    480 ml  Output   1100 ml  Net   -620 ml   Filed Weights   11/30/15 1900 12/01/15 0900  Weight: 63.73 kg (140 lb 8 oz) 66.5 kg (146 lb 9.7 oz)    Exam: General:  Alert, oriented, calm, in no acute distress Eyes: pupils round and reactive to light and accomodation, clear sclerea Neck: supple, no masses, trachea mildline  Cardiovascular: RRR, no murmurs or rubs, no peripheral edema  Respiratory: clear to auscultation though diminished on left, no wheezes, no crackles  Abdomen: soft, nontender, nondistended, normal bowel tones heard  Skin: dry, no rashes  Musculoskeletal: no joint effusions, normal range of motion  Psychiatric: appropriate affect, normal speech  Neurologic: extraocular muscles intact, clear speech, moving all extremities with intact sensorium    Data Reviewed: CBC:  Recent Labs Lab 11/30/15 1036 12/02/15 0517  WBC 5.2 5.7  HGB 9.6* 9.2*  HCT 30.7* 28.6*  MCV 93.9 92.0  PLT 443* [id number]*   Basic Metabolic Panel:  Recent Labs Lab 11/30/15 1036 12/01/15 0442 12/02/15 0517  NA 136 138 136  K 3.6 4.0 4.0  CL 103 105 103  CO2 25 27 27   GLUCOSE 146* 101* 102*  BUN 11 10 9   CREATININE 0.91 0.78 0.68  CALCIUM 8.6* 8.5* 8.3*  MG 1.9  --   --   PHOS 2.5  --   --  GFR: Estimated Creatinine Clearance: 68.3 mL/min (by C-G formula based on Cr of 0.68). Liver Function Tests:  Recent Labs Lab 11/30/15 1036 11/30/15 1729 12/01/15 0442  AST 25  --  37  ALT 24  --  34  ALKPHOS 106  --  113  BILITOT 0.4  --  0.2*  PROT 8.4* 8.6* 7.8  ALBUMIN 2.5*  --  2.4*   No results for input(s): LIPASE, AMYLASE in the last 168 hours. No results for input(s): AMMONIA in the last 168 hours. Coagulation Profile:  Recent Labs Lab 11/30/15 1036  INR 1.31   Cardiac Enzymes:  Recent Labs Lab 11/30/15 1036  TROPONINI <0.03   BNP (last 3 results) No results for input(s):  PROBNP in the last 8760 hours. HbA1C: No results for input(s): HGBA1C in the last 72 hours. CBG: No results for input(s): GLUCAP in the last 168 hours. Lipid Profile: No results for input(s): CHOL, HDL, LDLCALC, TRIG, CHOLHDL, LDLDIRECT in the last 72 hours. Thyroid Function Tests:  Recent Labs  12/01/15 0442  TSH 4.131   Anemia Panel: No results for input(s): VITAMINB12, FOLATE, FERRITIN, TIBC, IRON, RETICCTPCT in the last 72 hours. Urine analysis:    Component Value Date/Time   COLORURINE YELLOW 11/30/2015 2250   APPEARANCEUR CLEAR 11/30/2015 2250   LABSPEC >1.046* 11/30/2015 2250   LABSPEC 1.010 09/07/2015 0918   PHURINE 5.0 11/30/2015 2250   PHURINE 6.5 09/07/2015 0918   GLUCOSEU NEGATIVE 11/30/2015 2250   GLUCOSEU Negative 09/07/2015 0918   HGBUR SMALL* 11/30/2015 2250   HGBUR Moderate 09/07/2015 0918   BILIRUBINUR NEGATIVE 11/30/2015 2250   BILIRUBINUR Negative 09/07/2015 0918   KETONESUR NEGATIVE 11/30/2015 2250   KETONESUR Negative 09/07/2015 0918   PROTEINUR 30* 11/30/2015 2250   PROTEINUR Negative 09/07/2015 0918   UROBILINOGEN 0.2 09/07/2015 0918   UROBILINOGEN 0.2 12/25/2014 1052   NITRITE NEGATIVE 11/30/2015 2250   NITRITE Negative 09/07/2015 0918   LEUKOCYTESUR SMALL* 11/30/2015 2250   LEUKOCYTESUR Negative 09/07/2015 0918   Sepsis Labs: @LABRCNTIP (procalcitonin:4,lacticidven:4)  ) Recent Results (from the past 240 hour(s))  Culture, body fluid-bottle     Status: None (Preliminary result)   Collection Time: 11/30/15  4:40 PM  Result Value Ref Range Status   Specimen Description FLUID PLEURAL LEFT  Final   Special Requests BOTTLES DRAWN AEROBIC ONLY 6CC  Final   Gram Stain   Final    CYTOSPIN SMEAR WBC PRESENT,BOTH PMN AND MONONUCLEAR NO ORGANISMS SEEN    Culture   Final    NO GROWTH < 24 HOURS Performed at [facility]    Report Status PENDING  Incomplete      Studies: Dg Chest 2 View  12/02/2015  CLINICAL DATA:  Pleural  effusion.  Endometrial carcinoma. EXAM: CHEST  2 VIEW COMPARISON:  11/30/2015 chest radiograph. FINDINGS: Right internal jugular MediPort terminates in the lower third of the superior vena cava. Stable cardiomediastinal silhouette with mild cardiomegaly. No pneumothorax. No right pleural effusion. Moderate to large left pleural effusion is stable. No pulmonary edema. Complete lingular and left lower lobe atelectasis. Re- demonstration of multiple bilateral pulmonary nodules, unchanged. IMPRESSION: 1. Stable moderate to large left pleural effusion with complete lingular and left lower lobe atelectasis. 2. Re- demonstration of multiple bilateral pulmonary nodules consistent with pulmonary metastases. Electronically Signed   By: Ilona Sorrel M.D.   On: 12/02/2015 10:27    Scheduled Meds: . docusate sodium  100 mg Oral Q12H  . enoxaparin (LOVENOX) injection  40 mg Subcutaneous Q24H  .  ferrous sulfate  325 mg Oral Daily  . fludrocortisone  0.05 mg Oral Daily  . mirtazapine  30 mg Oral QHS  . multivitamin with minerals  1 tablet Oral Daily    Continuous Infusions:    LOS: 2 days   Time spent: 26 minutes  Mir Marry Guan, MD [facility] Pager [phone]  If 7PM-7AM, please contact night-coverage [email or web address] Password [facility] 12/02/2015, 11:57 AM

## 2015-12-03 ENCOUNTER — Encounter (HOSPITAL_COMMUNITY): Payer: Self-pay | Admitting: Radiology

## 2015-12-03 LAB — CBC
HEMATOCRIT: 29.4 % — AB (ref 36.0–46.0)
Hemoglobin: 9.3 g/dL — ABNORMAL LOW (ref 12.0–15.0)
MCH: 29.1 pg (ref 26.0–34.0)
MCHC: 31.6 g/dL (ref 30.0–36.0)
MCV: 91.9 fL (ref 78.0–100.0)
Platelets: 414 10*3/uL — ABNORMAL HIGH (ref 150–400)
RBC: 3.2 MIL/uL — AB (ref 3.87–5.11)
RDW: 17.2 % — AB (ref 11.5–15.5)
WBC: 5.2 10*3/uL (ref 4.0–10.5)

## 2015-12-03 LAB — BASIC METABOLIC PANEL
ANION GAP: 7 (ref 5–15)
BUN: 10 mg/dL (ref 6–20)
CO2: 27 mmol/L (ref 22–32)
Calcium: 8.3 mg/dL — ABNORMAL LOW (ref 8.9–10.3)
Chloride: 103 mmol/L (ref 101–111)
Creatinine, Ser: 0.81 mg/dL (ref 0.44–1.00)
GFR calc Af Amer: >60 mL/min (ref >60)
GFR calc non Af Amer: >60 mL/min (ref >60)
GLUCOSE: 102 mg/dL — AB (ref 65–99)
POTASSIUM: 4.3 mmol/L (ref 3.5–5.1)
Sodium: 137 mmol/L (ref 135–145)

## 2015-12-03 MED ORDER — ENOXAPARIN SODIUM 40 MG/0.4ML ~~LOC~~ SOLN
40.0000 mg | SUBCUTANEOUS | Status: DC
  Administered 2015-12-04 – 2015-12-05 (×2): 40 mg via SUBCUTANEOUS
  Filled 2015-12-03 (×2): qty 0.4

## 2015-12-03 MED ORDER — CEFAZOLIN SODIUM-DEXTROSE 2-4 GM/100ML-% IV SOLN
2.0000 g | INTRAVENOUS | Status: AC
  Administered 2015-12-04: 2 g via INTRAVENOUS
  Filled 2015-12-03: qty 100

## 2015-12-03 NOTE — Progress Notes (Signed)
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 She had bedside thoracentesis with pulmonary on 6/15, with 1500cc of cloudy fluid obtained.   This AM she seems a bit despondent, is tearful. She denies any needs at this time, no pain, nausea or SOB.  Assessment/Plan: Pleural effusion, concern for malignant pleural effusion - path c/w adenocarcinoma, discussed with Dr. Ashok Cordia this AM who is on call for pulmonology. Does not recommend bedside thoracentesis today, recommends Pleurex when able. IR not here until Monday. I think safest plan is for patient to await Pleurex Monday, as we do not know how quickly her malignant effusion might recur. CXR 6/17 shows stable effusion.  Endometrial cancer, concern for metastasis - oncology Dr. Marko Plume is following along, has ordered CT Abd/Pelvis which shows pelvic lymphadenopathy on top of lung base mass and effusion. Discussed plan with her on 6/16.  Hypoxia - due to effusion  Cachexia ([facility])  Moderate malnutrition ([facility])  Code Status: FULL   Family Communication: None present   Disposition Plan: Likely to home in 24-48 hours after Pleurex.  Consultants:  Oncology  Pulmonary   Procedures:  Bedside thoracentesis 6/15   Antimicrobials:  None    Objective: Filed Vitals:   12/02/15 0500 12/02/15 1517 12/02/15 2157 12/03/15 0554  BP: 88/65 88/62 90/63  88/71  Pulse: 88 92 101 89  Temp: 98.5 F (36.9 C) 99.8 F (37.7 C) 99.8 F (37.7 C) 98.5 F (36.9 C)  TempSrc: Oral Oral Oral Oral  Resp: 18 24 24 22   Height:      Weight:      SpO2: 92% 91% 92% 94%     Intake/Output Summary (Last 24 hours) at 12/03/15 1027 Last data filed at 12/03/15 0835  Gross per 24 hour  Intake    960 ml  Output      0 ml  Net    960 ml   Filed Weights   11/30/15 1900 12/01/15 0900  Weight: 63.73 kg (140 lb 8 oz) 66.5 kg (146 lb 9.7 oz)    Exam: General:  Alert, oriented, calm, in no acute distress Eyes: pupils round and reactive to light and accomodation, clear sclerea Neck: supple, no masses, trachea mildline  Cardiovascular: RRR, no murmurs or rubs, no peripheral edema  Respiratory: clear to auscultation though diminished on left, no wheezes, no crackles  Abdomen: soft, nontender, nondistended, normal bowel tones heard  Skin: dry, no rashes  Musculoskeletal: no joint effusions, normal range of motion  Psychiatric: appropriate affect, normal speech  Neurologic: extraocular muscles intact, clear speech, moving all extremities with intact sensorium    Data Reviewed: CBC:  Recent Labs Lab 11/30/15 1036 12/02/15 0517 12/03/15 0459  WBC 5.2 5.7 5.2  HGB 9.6* 9.2* 9.3*  HCT 30.7* 28.6* 29.4*  MCV 93.9 92.0 91.9  PLT 443* 409* [id number]*   Basic Metabolic Panel:  Recent Labs Lab 11/30/15 1036 12/01/15 0442 12/02/15 0517 12/03/15 0459  NA 136 138 136 137  K 3.6 4.0 4.0 4.3  CL 103 105 103 103  CO2 25 27 27 27   GLUCOSE 146* 101* 102* 102*  BUN 11 10 9 10   CREATININE 0.91 0.78 0.68  0.81  CALCIUM 8.6* 8.5* 8.3* 8.3*  MG 1.9  --   --   --   PHOS 2.5  --   --   --    GFR: Estimated Creatinine Clearance: 67.4 mL/min (by C-G formula based on Cr of 0.81). Liver Function Tests:  Recent Labs Lab 11/30/15 1036 11/30/15 1729 12/01/15 0442  AST 25  --  37  ALT 24  --  34  ALKPHOS 106  --  113  BILITOT 0.4  --  0.2*  PROT 8.4* 8.6* 7.8  ALBUMIN 2.5*  --  2.4*   No results for input(s): LIPASE, AMYLASE in the last 168 hours. No results for input(s): AMMONIA in the last 168 hours. Coagulation Profile:  Recent Labs Lab 11/30/15 1036   INR 1.31   Cardiac Enzymes:  Recent Labs Lab 11/30/15 1036  TROPONINI <0.03   BNP (last 3 results) No results for input(s): PROBNP in the last 8760 hours. HbA1C: No results for input(s): HGBA1C in the last 72 hours. CBG: No results for input(s): GLUCAP in the last 168 hours. Lipid Profile: No results for input(s): CHOL, HDL, LDLCALC, TRIG, CHOLHDL, LDLDIRECT in the last 72 hours. Thyroid Function Tests:  Recent Labs  12/01/15 0442  TSH 4.131   Anemia Panel: No results for input(s): VITAMINB12, FOLATE, FERRITIN, TIBC, IRON, RETICCTPCT in the last 72 hours. Urine analysis:    Component Value Date/Time   COLORURINE YELLOW 11/30/2015 2250   APPEARANCEUR CLEAR 11/30/2015 2250   LABSPEC >1.046* 11/30/2015 2250   LABSPEC 1.010 09/07/2015 0918   PHURINE 5.0 11/30/2015 2250   PHURINE 6.5 09/07/2015 0918   GLUCOSEU NEGATIVE 11/30/2015 2250   GLUCOSEU Negative 09/07/2015 0918   HGBUR SMALL* 11/30/2015 2250   HGBUR Moderate 09/07/2015 0918   BILIRUBINUR NEGATIVE 11/30/2015 2250   BILIRUBINUR Negative 09/07/2015 0918   KETONESUR NEGATIVE 11/30/2015 2250   KETONESUR Negative 09/07/2015 0918   PROTEINUR 30* 11/30/2015 2250   PROTEINUR Negative 09/07/2015 0918   UROBILINOGEN 0.2 09/07/2015 0918   UROBILINOGEN 0.2 12/25/2014 1052   NITRITE NEGATIVE 11/30/2015 2250   NITRITE Negative 09/07/2015 0918   LEUKOCYTESUR SMALL* 11/30/2015 2250   LEUKOCYTESUR Negative 09/07/2015 0918   Sepsis Labs: @LABRCNTIP (procalcitonin:4,lacticidven:4)  ) Recent Results (from the past 240 hour(s))  Culture, body fluid-bottle     Status: None (Preliminary result)   Collection Time: 11/30/15  4:40 PM  Result Value Ref Range Status   Specimen Description FLUID PLEURAL LEFT  Final   Special Requests BOTTLES DRAWN AEROBIC ONLY 6CC  Final   Gram Stain   Final    CYTOSPIN SMEAR WBC PRESENT,BOTH PMN AND MONONUCLEAR NO ORGANISMS SEEN    Culture   Final    NO GROWTH 2 DAYS Performed at [facility]    Report Status PENDING  Incomplete      Studies: No results found.  Scheduled Meds: . docusate sodium  100 mg Oral Q12H  . enoxaparin (LOVENOX) injection  40 mg Subcutaneous Q24H  . ferrous sulfate  325 mg Oral Daily  . fludrocortisone  0.05 mg Oral Daily  . mirtazapine  30 mg Oral QHS  . multivitamin with minerals  1 tablet Oral Daily    Continuous Infusions:    LOS: 3 days   Time spent: 24 minutes  Kyce Ging Marry Guan, MD [facility] Pager [phone]  If 7PM-7AM, please contact night-coverage [email or web address] Password [facility] 12/03/2015, 10:27 AM

## 2015-12-03 NOTE — Progress Notes (Signed)
Chief Complaint: Patient was seen in consultation today for PleurX catheter palcement at the request of Dr. Marshell Garfinkel  Referring Physician(s): Dr. Marshell Garfinkel Dr. Evlyn Clines  Supervising Physician: Corrie Mckusick  Patient Status: Inpatient  History of Present Illness: Sheena Caldwell is a 65 y.o. female with metastatic endometrial cancer. She now has pulmonary nodules with large left pleural effusion. Underwent thoracentesis by PCCM on 6/15 with 1.5L removed. Cyto not final but preliminary apparently c/w adenocarcinoma PCCM consult and Dr. Marko Plume have suggested PleurX catheter placement as moderate to large effusion persists/recurred. She seems to be resting comfortable but c/o a lot of fatigue and has some DOE. She has discussed with her family and is agreeable to PleurX catheter placement. IR is asked to place this catheter Chart, PMHx, meds, labs, imaging reviewed.  Past Medical History  Diagnosis Date  . Radiation 01/03/15, 01/11/15, 01/13/15, 01/24/15, 01/26/15    proximal vagina 30 gray  . Arthritis   . Blood transfusion without reported diagnosis     2015, 2016  . COPD (chronic obstructive pulmonary disease) ([place])   . Uterine cancer [facility])     finished chemo May 2016, to start radiation July 2016    Past Surgical History  Procedure Laterality Date  . Cesarean section  1991  . Incise and drain abcess  1970    scalp  . Portacath placement  04/26/2014    rt. power port with tip in SVC  . Total abdominal hysterectomy w/ bilateral salpingoophorectomy  04/06/14    TAH, BSO, omentectomy, pelvic and right common iliac node evaluation    Allergies: Levaquin  Medications:  Current facility-administered medications:  .  acetaminophen (TYLENOL) tablet 650 mg, 650 mg, Oral, Q6H PRN, Gardiner Barefoot, NP, 650 mg at 11/30/15 2139 .  diatrizoate meglumine-sodium (GASTROGRAFIN) 66-10 % solution 15 mL, 15 mL, Oral, BID PRN, Mir Marry Guan, MD .   docusate sodium (COLACE) capsule 100 mg, 100 mg, Oral, Q12H, Bonnell Public, MD, 100 mg at 12/03/15 1000 .  enoxaparin (LOVENOX) injection 40 mg, 40 mg, Subcutaneous, Q24H, Bonnell Public, MD, 40 mg at 12/02/15 2158 .  feeding supplement (ENSURE ENLIVE) (ENSURE ENLIVE) liquid 237 mL, 237 mL, Oral, Daily PRN, Mir Marry Guan, MD .  ferrous sulfate tablet 325 mg, 325 mg, Oral, Daily, Bonnell Public, MD, 325 mg at 12/03/15 0900 .  fludrocortisone (FLORINEF) tablet 0.05 mg, 0.05 mg, Oral, Daily, Bonnell Public, MD, 0.05 mg at 12/02/15 2158 .  lactose free nutrition (BOOST PLUS) liquid 237 mL, 237 mL, Oral, Daily PRN, Mir Marry Guan, MD .  mirtazapine (REMERON) tablet 30 mg, 30 mg, Oral, QHS, Bonnell Public, MD, 30 mg at 11/30/15 2100 .  multivitamin with minerals tablet 1 tablet, 1 tablet, Oral, Daily, Bonnell Public, MD, 1 tablet at 12/03/15 0900 .  polyethylene glycol (MIRALAX / GLYCOLAX) packet 17 g, 17 g, Oral, Daily PRN, Bonnell Public, MD, 17 g at 12/03/15 0856 .  traMADol (ULTRAM) tablet 50 mg, 50 mg, Oral, Q6H PRN, Gardiner Barefoot, NP    Family History  Problem Relation Age of Onset  . Hypertension Mother   . Cancer Father   . Colon cancer Neg Hx   . Esophageal cancer Neg Hx   . Rectal cancer Neg Hx   . Stomach cancer Neg Hx     Social History   Social History  . Marital Status: Single    Spouse Name: n/a  . Number of  Children: 2  . Years of Education: 13   Occupational History  . customer service representative     [employer]   Social History Main Topics  . Smoking status: Former Smoker -- 1.00 packs/day for 30 years    Types: Cigarettes    Quit date: 07/16/2013  . Smokeless tobacco: Never Used  . Alcohol Use: 1.2 - 1.8 oz/week    2-3 Glasses of wine per week  . Drug Use: Yes    Special: Marijuana     Comment: 7 to 9 joints each week "to relax"  . Sexual Activity:    Partners: Male    Birth Control/ Protection:  Post-menopausal   Other Topics Concern  . None   Social History Narrative   Daughter graduated from college 2013, plans to go to Sports coach school, lives in [place].  Son lives in [place].     Review of Systems: A 12 point ROS discussed and pertinent positives are indicated in the HPI above.  All other systems are negative.  Review of Systems  Vital Signs: BP 88/71 mmHg  Pulse 89  Temp(Src) 98.5 F (36.9 C) (Oral)  Resp 22  Ht 5\' 6"  (1.676 m)  Wt 146 lb 9.7 oz (66.5 kg)  BMI 23.67 kg/m2  SpO2 94%  Physical Exam  Constitutional: She is oriented to person, place, and time. She appears well-developed. No distress.  HENT:  Head: Normocephalic.  Mouth/Throat: Oropharynx is clear and moist.  Neck: Normal range of motion. No JVD present.  Cardiovascular: Normal rate, regular rhythm and normal heart sounds.   Pulmonary/Chest: Effort normal. No respiratory distress. She has no wheezes.  No BS left base  Neurological: She is alert and oriented to person, place, and time.  Skin: Skin is warm. No rash noted.  Psychiatric: She has a normal mood and affect. Judgment normal.    Mallampati Score:  MD Evaluation Airway: WNL Heart: WNL Abdomen: WNL Chest/ Lungs: WNL ASA  Classification: 3 Mallampati/Airway Score: One  Imaging: Dg Chest 2 View  12/02/2015  CLINICAL DATA:  Pleural effusion.  Endometrial carcinoma. EXAM: CHEST  2 VIEW COMPARISON:  11/30/2015 chest radiograph. FINDINGS: Right internal jugular MediPort terminates in the lower third of the superior vena cava. Stable cardiomediastinal silhouette with mild cardiomegaly. No pneumothorax. No right pleural effusion. Moderate to large left pleural effusion is stable. No pulmonary edema. Complete lingular and left lower lobe atelectasis. Re- demonstration of multiple bilateral pulmonary nodules, unchanged. IMPRESSION: 1. Stable moderate to large left pleural effusion with complete lingular and left lower lobe atelectasis. 2. Re-  demonstration of multiple bilateral pulmonary nodules consistent with pulmonary metastases. Electronically Signed   By: Ilona Sorrel M.D.   On: 12/02/2015 10:27   Dg Chest 2 View  11/30/2015  CLINICAL DATA:  Shortness of Breath, history of uterine cancer, post hysterectomy EXAM: CHEST  2 VIEW COMPARISON:  09/27/2014 FINDINGS: Borderline cardiomegaly. There is moderate to large left pleural effusion. Atelectasis or infiltrate is noted in left lower lobe. No pulmonary edema. Stable right IJ Port-A-Cath position. There is a nodular density in right lower lobe measure about 1.3 cm. Further correlation with CT scan of the chest is recommended. IMPRESSION: There is moderate to large left pleural effusion. Atelectasis or infiltrate is noted in left lower lobe. No pulmonary edema. Stable right IJ Port-A-Cath position. There is a nodular density in right lower lobe measure about 1.3 cm. Further evaluation with CT scan of the chest is recommended. Electronically Signed   By: Julien Girt  Pop M.D.   On: 11/30/2015 11:04   Ct Chest W Contrast  11/30/2015  CLINICAL DATA:  Weakness, shortness of breath for 4-5 months, worsening. Intermittent dry cough. History of endometrial carcinoma diagnosed in October of 2015. EXAM: CT CHEST WITH CONTRAST TECHNIQUE: Multidetector CT imaging of the chest was performed during intravenous contrast administration. CONTRAST:  69mL ISOVUE-300 IOPAMIDOL (ISOVUE-300) INJECTION 61% COMPARISON:  Chest CT dated 11/04/2014. FINDINGS: Mediastinum/Lymph Nodes: New enlarged and centrally necrotic lymph nodes are seen in the right hilum, measuring 1.8 and 1.3 cm short axis dimension. Probable additional centrally necrotic lymph node is seen within the left lower perihilar region measuring 1.6 cm short axis dimension. Numerous additional lymph nodes are seen within the mediastinum, most prominently in the left paratracheal and aortopulmonary window regions, certainly pathologic by number. Configuration of the  thoracic aorta is stable, with associated mild atherosclerotic change. Heart size is normal although displaced to the right by the large left pleural effusion. Lungs/Pleura: There were several small pulmonary nodules identified on chest CT of 11/04/2014, largest measuring 6 mm. There has been marked progression of disease since that previous chest CT. There are multiple new and larger pulmonary nodules/masses within the right lung. This includes a 3.1 x 2.6 cm mass at the right lung base posteriorly (series 5, image 121), a 1.8 x 1.6 cm nodule within the right lower lobe posteriorly (series 5, image 103), a 1.3 x 1.3 cm nodule within the posterior-lateral portion of the right lower lobe (series 5, image 96), an irregular nodule within the superior segment of the right lower lobe measuring 1.2 x 0.9 cm (series 5, image 79) and a pleural based nodule adjacent to the right major fissure measuring 1.3 x 0.8 cm (series 5, image 67). There is a new large left pleural effusion with adjacent compressive atelectasis. Pleural based mass within the left upper lobe medially measures 2.6 x 1.7 cm (series 5, image 48). There is also irregular pleural thickening along the medial and anterior aspects of the left upper lobe, highly suggestive of neoplastic involvement. Emphysematous changes noted bilaterally, upper lobe predominant. Upper abdomen: Limited images of the upper abdomen are unremarkable. Musculoskeletal: Scattered tiny lucent foci are seen within the thoracic vertebral bodies, too small to definitively characterize, possibly early developing lytic metastases. No definite evidence of osseous metastasis identified. IMPRESSION: 1. Significant progression of disease, as detailed above. This includes numerous pulmonary masses/nodules within the right lung, largest of which is at the right lung base measuring 3.1 x 2.6 cm. A new pleural based mass is now seen along the medial aspects of the left upper lobe measuring 2.6 x 1.7  cm. On earlier chest CT of 11/04/2014, the largest pulmonary nodule identified within the left lung was 6 mm and the largest pulmonary nodule identified within the right lung was 4 mm. 2. Mediastinal and perihilar lymphadenopathy, most numerous within the left lower paratracheal and aortopulmonary window regions, with largest lymph nodes in the right perihilar region demonstrating evidence of central necrosis, almost certainly compatible with metastatic lymphadenopathy throughout and further evidence of progression of disease. 3. Large left pleural effusion, likely malignant effusion, with adjacent compressive atelectasis. Heart is displaced to the right by the large left pleural effusion. 4. Emphysematous change, upper lobe predominant. 5. No definite evidence of osseous metastasis. Scattered tiny lucent foci within the thoracic vertebral bodies could conceivably represent early developing metastases. These results were called by telephone at the time of interpretation on 11/30/2015 at 12:39 pm to Dr. Herbie Baltimore  [place] , who verbally acknowledged these results. Electronically Signed   By: Franki Cabot M.D.   On: 11/30/2015 12:43   Ct Abdomen Pelvis W Contrast  12/01/2015  CLINICAL DATA:  Uterine cancer diagnosed in 2015. Chemotherapy completed May 2016 and radiation therapy in August 2016. Pleural effusion and pulmonary nodularity on chest CT. EXAM: CT ABDOMEN AND PELVIS WITH CONTRAST TECHNIQUE: Multidetector CT imaging of the abdomen and pelvis was performed using the standard protocol following bolus administration of intravenous contrast. CONTRAST:  126mL ISOVUE-300 IOPAMIDOL (ISOVUE-300) INJECTION 61% COMPARISON:  Chest CT 11/30/2015.  Abdominal pelvic CT 12/25/2014 FINDINGS: Lower chest: As seen on yesterday's chest CT, there is a large left pleural effusion with several pleural-based nodules in the left hemithorax consistent with metastatic disease. These measure up to 1.4 cm on image 10. There is no  significant pleural fluid on the right. There are right lower lobe pulmonary nodules measuring up to 10 x 11 mm on image number 5 and 2.9 x 4.4 cm on image number 40. The left lower lobe remains collapsed. There is a probable 1.6 cm left lower lobe nodule on image 11. Right infrahilar adenopathy is partially imaged. Hepatobiliary: There is stable low-density in the left hepatic lobe adjacent to the falciform ligament on image 40, likely focal fat. No new or suspicious hepatic findings. No evidence of gallstones, gallbladder wall thickening or biliary dilatation. Pancreas: Unremarkable. No pancreatic ductal dilatation or surrounding inflammatory changes. Spleen: Normal in size without focal abnormality. Adrenals/Urinary Tract: Both adrenal glands appear normal. Grossly stable bilateral renal cysts. No evidence of renal mass, urinary tract calculus or hydronephrosis. The bladder appears unremarkable. Stomach/Bowel: No evidence of bowel wall thickening, distention or surrounding inflammatory change. Moderate stool throughout the colon. Vascular/Lymphatic: There is a new mildly enlarged left periaortic node measuring 12 mm on image 48. There is a large nodal mass along the left pelvic sidewall, measuring 6.2 x 2.9 cm on image 73. There is aortic and branch vessel atherosclerosis. No evidence of large vessel occlusion. Reproductive: Hysterectomy.  No evidence of adnexal mass. Other: No ascites or peritoneal nodularity. Musculoskeletal: No acute or significant osseous findings. IMPRESSION: 1. As seen on yesterday's chest CT, there is metastatic disease to both lung bases with a large malignant left pleural effusion. There are several pleural-based metastases on the left and right infrahilar lymphadenopathy, incompletely visualized. 2. Left periaortic and left pelvic side wall lymphadenopathy consistent with metastatic disease. No other evidence of abdominal pelvic metastatic disease. 3. No hydronephrosis. Electronically  Signed   By: Richardean Sale M.D.   On: 12/01/2015 11:59   Dg Chest Port 1 View  11/30/2015  CLINICAL DATA:  Status post thoracentesis EXAM: PORTABLE CHEST 1 VIEW COMPARISON:  11/30/2015 FINDINGS: Cardiac shadow is stable. A right chest wall port is noted. The right lung is clear with the exception of a focal nodule projecting over the lung base stable from the prior CT examination. There has been interval it thoracentesis on the left with reduction in the amount of fluid although a large pleural effusion remains. No pneumothorax is noted. IMPRESSION: No evidence of pneumothorax following thoracentesis. A significant left pleural effusion remains. Changes consistent with mass lesion in the right lung base. Electronically Signed   By: Inez Catalina M.D.   On: 11/30/2015 18:43    Labs:  CBC:  Recent Labs  11/14/15 1134 11/30/15 1036 12/02/15 0517 12/03/15 0459  WBC 4.9 5.2 5.7 5.2  HGB 9.6* 9.6* 9.2* 9.3*  HCT 30.3* 30.7*  28.6* 29.4*  PLT 359 443* 409* 414*    COAGS:  Recent Labs  11/30/15 1036  INR 1.31    BMP:  Recent Labs  11/30/15 1036 12/01/15 0442 12/02/15 0517 12/03/15 0459  NA 136 138 136 137  K 3.6 4.0 4.0 4.3  CL 103 105 103 103  CO2 25 27 27 27   GLUCOSE 146* 101* 102* 102*  BUN 11 10 9 10   CALCIUM 8.6* 8.5* 8.3* 8.3*  CREATININE 0.91 0.78 0.68 0.81  GFRNONAA >60 >60 >60 >60  GFRAA >60 >60 >60 >60    LIVER FUNCTION TESTS:  Recent Labs  09/07/15 0916  10/02/15 1330 11/30/15 1036 11/30/15 1729 12/01/15 0442  BILITOT <0.30  --  <0.30 0.4  --  0.2*  AST 24  --  15 25  --  37  ALT 34  --  12 24  --  34  ALKPHOS 118  --  91 106  --  113  PROT 9.0*  < > 8.9* 8.4* 8.6* 7.8  ALBUMIN 2.6*  --  2.6* 2.5*  --  2.4*  < > = values in this interval not displayed.  TUMOR MARKERS: No results for input(s): AFPTM, CEA, CA199, CHROMGRNA in the last 8760 hours.  Assessment and Plan: Metastatic endometrial cancer with pulmonary involvement and recurrent left  malignant effusion Discussed indication for PleurX catheter placement vs serial thoracentesis. Procedure, risks, complications including home care, infection, bleeding discussed. Pt is agreeable to PleurX cath placement. Risks and Benefits discussed with the patient including, but not limited to bleeding, infection, pneumothorax requiring chest tube placement All of the patient's questions were answered, patient is agreeable to proceed. Consent signed and in chart. Will tentatively plan for procedure tomorrow, schedule permitting. NPO p MN, hold Lovenox  All of the patient's questions were answered, patient is agreeable to proceed. Consent signed and in chart.   Thank you for this interesting consult.    A copy of this report was sent to the requesting provider on this date.  Electronically Signed: Ascencion Dike 12/03/2015, 11:04 AM   I spent a total of 25 minutes in face to face in clinical consultation, greater than 50% of which was counseling/coordinating care for pleurx catheter placement

## 2015-12-04 ENCOUNTER — Inpatient Hospital Stay (HOSPITAL_COMMUNITY): Payer: Medicaid Other

## 2015-12-04 DIAGNOSIS — E440 Moderate protein-calorie malnutrition: Secondary | ICD-10-CM

## 2015-12-04 DIAGNOSIS — J910 Malignant pleural effusion: Secondary | ICD-10-CM

## 2015-12-04 LAB — CBC
HCT: 29 % — ABNORMAL LOW (ref 36.0–46.0)
HEMOGLOBIN: 9.4 g/dL — AB (ref 12.0–15.0)
MCH: 29.7 pg (ref 26.0–34.0)
MCHC: 32.4 g/dL (ref 30.0–36.0)
MCV: 91.8 fL (ref 78.0–100.0)
Platelets: 445 10*3/uL — ABNORMAL HIGH (ref 150–400)
RBC: 3.16 MIL/uL — AB (ref 3.87–5.11)
RDW: 17 % — ABNORMAL HIGH (ref 11.5–15.5)
WBC: 5.3 10*3/uL (ref 4.0–10.5)

## 2015-12-04 LAB — BASIC METABOLIC PANEL
Anion gap: 7 (ref 5–15)
BUN: 9 mg/dL (ref 6–20)
CHLORIDE: 101 mmol/L (ref 101–111)
CO2: 26 mmol/L (ref 22–32)
Calcium: 8.2 mg/dL — ABNORMAL LOW (ref 8.9–10.3)
Creatinine, Ser: 0.69 mg/dL (ref 0.44–1.00)
GFR calc non Af Amer: >60 mL/min (ref >60)
Glucose, Bld: 102 mg/dL — ABNORMAL HIGH (ref 65–99)
POTASSIUM: 4.1 mmol/L (ref 3.5–5.1)
SODIUM: 134 mmol/L — AB (ref 135–145)

## 2015-12-04 LAB — PROCALCITONIN: PROCALCITONIN: 0.11 ng/mL

## 2015-12-04 MED ORDER — LIDOCAINE HCL 1 % IJ SOLN
INTRAMUSCULAR | Status: AC | PRN
  Administered 2015-12-04: 20 mL via INTRADERMAL

## 2015-12-04 MED ORDER — FENTANYL CITRATE (PF) 100 MCG/2ML IJ SOLN
INTRAMUSCULAR | Status: AC
  Filled 2015-12-04: qty 4

## 2015-12-04 MED ORDER — ONDANSETRON 4 MG PO TBDP: 4.0000 mg | ORAL_TABLET | Freq: Three times a day (TID) | ORAL | Status: DC | PRN

## 2015-12-04 MED ORDER — HEPARIN SOD (PORK) LOCK FLUSH 100 UNIT/ML IV SOLN
INTRAVENOUS | Status: AC
  Filled 2015-12-04: qty 5

## 2015-12-04 MED ORDER — FENTANYL CITRATE (PF) 100 MCG/2ML IJ SOLN
INTRAMUSCULAR | Status: AC | PRN
  Administered 2015-12-04 (×2): 25 ug via INTRAVENOUS

## 2015-12-04 MED ORDER — SODIUM CHLORIDE 0.9 % IV BOLUS (SEPSIS)
500.0000 mL | Freq: Once | INTRAVENOUS | Status: AC
  Administered 2015-12-04: 500 mL via INTRAVENOUS

## 2015-12-04 MED ORDER — MIDAZOLAM HCL 2 MG/2ML IJ SOLN
INTRAMUSCULAR | Status: AC | PRN
  Administered 2015-12-04: 0.5 mg via INTRAVENOUS

## 2015-12-04 MED ORDER — MIDAZOLAM HCL 2 MG/2ML IJ SOLN
INTRAMUSCULAR | Status: AC
  Filled 2015-12-04: qty 6

## 2015-12-04 MED ORDER — LIDOCAINE HCL 1 % IJ SOLN
INTRAMUSCULAR | Status: AC
  Filled 2015-12-04: qty 20

## 2015-12-04 NOTE — Procedures (Signed)
Successful placement of tunneled left pleural catheter.  Removed 800 ml of fluid.  Minimal blood loss.  No immediate complication.

## 2015-12-04 NOTE — Sedation Documentation (Addendum)
500cc LR fluid bolus started to support BP.

## 2015-12-04 NOTE — Progress Notes (Signed)
PROGRESS NOTE  Sheena Caldwell [id number] DOB: [birth year] DOA: 11/30/2015 PCP: Ardith Dark, PA-C Marshell Garfinkel MD Gordy Levan, MD  Brief Narrative: 65 year old woman with history of endometrial cancer treated with chemotherapy, presented with 5 month history of progressive weakness, 2 month history of progressive shortness of breath. CT scan revealed left pleural effusion with multiple nodules.  Assessment/Plan: 1. Left pleural effusion, malignant. Status post large volume thoracentesis. Given recurrent fluid, Pleurx catheter placed 6/19. 2. Multiple lung nodules worrisome for metastatic disease. Oncology following. 3. Acute hypoxic respiratory failure secondary to effusion. Stable. 4. COPD , appears stable. 5. PMH uterine cancer, s/p  abdominal hysterectomy, omentectomy, chemotherapy and radiation last in 01/2015 6. Anemia, multifactorial. Stable. 7. Chronic low blood pressure per oncology, asymptomatic 8. History of alcohol use, marijuana 9. Tobacco use disorder in remission 10. Non-severe (moderate) malnutrition in context of chronic illness   Overall appears stable.  Continue management as per oncology and pulmonology.  Likely home next 24 hours  DVT prophylaxis: SCDs Code Status: full code Family Communication: none Disposition Plan: home  Murray Hodgkins, MD  [facility] Direct contact: [phone] --[place] [place]  --[email or web address]; password [email or web address]  7PM-7AM contact night coverage as above 12/04/2015, 4:35 PM  LOS: 4 days   Consultants:  Pulmonology  Oncology  Procedures:  6/15 thoracentesis: 1500 ml of cloudy amber pleural fluid was obtained. A sample was sent to Pathology, gram stain, cell counts, culture, LDH & protein as well as for infection analysis.  Antimicrobials:    HPI/Subjective: Feels okay. Breathing okay.  Objective: Filed Vitals:   12/03/15 1409 12/03/15 2111 12/04/15 0626 12/04/15 1445  BP: 88/66 89/68 79/58  87/63    Pulse: 87 95 87 92  Temp: 98.3 F (36.8 C) 99.8 F (37.7 C) 98.4 F (36.9 C) 99.2 F (37.3 C)  TempSrc: Axillary Oral Oral Oral  Resp: 20 22 20 20   Height:      Weight:      SpO2: 93% 100% 99% 90%    Intake/Output Summary (Last 24 hours) at 12/04/15 1635 Last data filed at 12/04/15 0700  Gross per 24 hour  Intake    740 ml  Output      0 ml  Net    740 ml     Filed Weights   11/30/15 1900 12/01/15 0900  Weight: 63.73 kg (140 lb 8 oz) 66.5 kg (146 lb 9.7 oz)    Exam:    Constitutional:  . Appears calm and comfortable Respiratory:  . CTA bilaterally, no w/r/r.  . Respiratory effort normal. No retractions or accessory muscle use Cardiovascular:  . RRR, no m/r/g Psychiatric:  . judgement and insight appear normal . Mental status o Mood, affect appropriate  I have personally reviewed following labs and imaging studies:  Hemoglobin stable 9.4, BMP unremarkable  Scheduled Meds: .  ceFAZolin (ANCEF) IV  2 g Intravenous On [place]  . docusate sodium  100 mg Oral Q12H  . enoxaparin (LOVENOX) injection  40 mg Subcutaneous Q24H  . fentaNYL      . ferrous sulfate  325 mg Oral Daily  . fludrocortisone  0.05 mg Oral Daily  . heparin lock flush      . lidocaine      . midazolam      . mirtazapine  30 mg Oral QHS  . multivitamin with minerals  1 tablet Oral Daily   Continuous Infusions:   Active Problems:   Pleural effusion   Malignant pleural effusion   Hypoxia  Cachexia (HCC)   Moderate malnutrition ([place])   LOS: 4 days   Time spent 20 minutes

## 2015-12-04 NOTE — Progress Notes (Signed)
   Name: Sheena Caldwell MRN: [id number] DOB: Apr 08, 1951    ADMISSION DATE:  11/30/2015 CONSULTATION DATE:  11/30/15  REFERRING MD :  Dr. Vanita Panda   CHIEF COMPLAINT:  Pleural Effusion    HISTORY OF PRESENT ILLNESS:  65 y/o F, former smoker,  with PMH of arthritis, COPD, and endometrial adenocarcinoma (treated with abdominal hysterectomy, omentectomy, chemotherapy and radiation last in 01/2015) thought to be in remission who presented to [facility] on 11/30/15 with reports of dyspnea and fatigue.    SUBJECTIVE:  No distress.  VITAL SIGNS: Temp:  [98.3 F (36.8 C)-99.8 F (37.7 C)] 98.4 F (36.9 C) (06/19 0626) Pulse Rate:  [87-95] 87 (06/19 0626) Resp:  [20-22] 20 (06/19 0626) BP: (79-89)/(58-68) 79/58 mmHg (06/19 0626) SpO2:  [93 %-100 %] 99 % (06/19 0626) Room air  PHYSICAL EXAMINATION: General:  Thin, chronically ill appearing female in NAD  Neuro:  AAOx4, speech clear, MAE  HEENT:  MM pink/moist, no JVD, can see port crossing over right clavicle, no LAN Cardiovascular:  S1S2 rrr, no m/r/g  Lungs:  Even/non-labored, diminished on L no accessory use  Abdomen:  Soft, non-distended Musculoskeletal:  No acute deformities  Skin:  Warm/dry, no edema, rashes or lesions   Recent Labs Lab 12/02/15 0517 12/03/15 0459 12/04/15 0448  NA 136 137 134*  K 4.0 4.3 4.1  CL 103 103 101  CO2 27 27 26   BUN 9 10 9   CREATININE 0.68 0.81 0.69  GLUCOSE 102* 102* 102*    Recent Labs Lab 12/02/15 0517 12/03/15 0459 12/04/15 0448  HGB 9.2* 9.3* 9.4*  HCT 28.6* 29.4* 29.0*  WBC 5.7 5.2 5.3  PLT 409* 414* 445*   No results found. SIGNIFICANT EVENTS  6/15  Admit with malaise/fatigue  STUDIES:  6/15  CXR >> moderate to large left pleural effusion with associated atelectasis / infiltrate in the LLL, and a nodular density in the RLL (~1.3 cm).   6/15  CT of the Chest >> significant progression of disease with numerous pulmonary nodules (largest measuring 3.1x2.6 cm), new pleural based mass  along the medial aspects of the LUL measuring 2.6x1.7, mediastinal and perihilar LAN, large left pleural effusion, emphysematous changes of the upper lobes and no definite evidence of osseus metastases but scattered tiny lucent foci in the thoracic vertebral bodies could be early developing metastases.   ASSESSMENT / PLAN:  Discussion: 65 y/o F, former smoker, with PMH of uterine cancer s/p hysterectomy / radiation thought to be in remission admitted 6/15 with fatigue. Found to have large left pleural effusion and progression of pulmonary nodules. Had thora w/ cytology that was c/w metastatic adenocarcinoma.   Metastatic adenocarcinoma H/o endometrial cancer Malignant Left Pleural Effusion  Multiple Pulmonary Nodules  Former Smoker  Plan: For pleur-x cath today F/u cxr in am and will need to establish her draining schedule [name] will follow in out pt for her effusion Further rx per heme/onc  Erick Colace ACNP-BC [place] Pager # [phone] OR # [phone] if no answer

## 2015-12-05 LAB — BASIC METABOLIC PANEL
ANION GAP: 5 (ref 5–15)
BUN: 11 mg/dL (ref 6–20)
CALCIUM: 8.3 mg/dL — AB (ref 8.9–10.3)
CO2: 27 mmol/L (ref 22–32)
Chloride: 104 mmol/L (ref 101–111)
Creatinine, Ser: 0.63 mg/dL (ref 0.44–1.00)
GFR calc Af Amer: >60 mL/min (ref >60)
GFR calc non Af Amer: >60 mL/min (ref >60)
GLUCOSE: 109 mg/dL — AB (ref 65–99)
Potassium: 4.5 mmol/L (ref 3.5–5.1)
Sodium: 136 mmol/L (ref 135–145)

## 2015-12-05 LAB — CBC
HEMATOCRIT: 28.6 % — AB (ref 36.0–46.0)
Hemoglobin: 9.1 g/dL — ABNORMAL LOW (ref 12.0–15.0)
MCH: 29.4 pg (ref 26.0–34.0)
MCHC: 31.8 g/dL (ref 30.0–36.0)
MCV: 92.6 fL (ref 78.0–100.0)
Platelets: 389 10*3/uL (ref 150–400)
RBC: 3.09 MIL/uL — ABNORMAL LOW (ref 3.87–5.11)
RDW: 17.2 % — AB (ref 11.5–15.5)
WBC: 5.3 10*3/uL (ref 4.0–10.5)

## 2015-12-05 LAB — CULTURE, BODY FLUID-BOTTLE: Culture: NO GROWTH

## 2015-12-05 LAB — CULTURE, BODY FLUID W GRAM STAIN -BOTTLE

## 2015-12-05 MED ORDER — KETOROLAC TROMETHAMINE 10 MG PO TABS
10.0000 mg | ORAL_TABLET | Freq: Three times a day (TID) | ORAL | Status: DC
  Filled 2015-12-05 (×3): qty 1

## 2015-12-05 MED ORDER — KETOROLAC TROMETHAMINE 10 MG PO TABS
10.0000 mg | ORAL_TABLET | Freq: Three times a day (TID) | ORAL | Status: DC
  Administered 2015-12-05 – 2015-12-06 (×3): 10 mg via ORAL
  Filled 2015-12-05 (×2): qty 1

## 2015-12-05 NOTE — Progress Notes (Signed)
Nutrition Follow-up  DOCUMENTATION CODES:   Non-severe (moderate) malnutrition in context of chronic illness  INTERVENTION:  - Continue Ensure Enlive and Boost Plus each once/day PRN. - Continue to encourage PO intakes with meals. - RD will continue to monitor for needs.  NUTRITION DIAGNOSIS:   Increased nutrient needs related to catabolic illness, cancer and cancer related treatments as evidenced by estimated needs. 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 According to the patient, she has been feeling progressively weak over the last 5 months. Over the last 2 months, patient has become progressively SOB necessitating ER visit today. No fever, no headache, no neck pain, no GI symptoms and no urinary symptoms. CT Scan of the Chest revealed significant left pleural effusion with multiple nodules.   6/20 Per chart review, pt with 50-75% intakes since previous visit. Appetite improving since admission, since PTA. Pt had Pleurex cath placed 6/19 with 800cc fluid drained at time of placement.  PRN nutrition supplements remain in place. Continue this regimen. Pt minimally meeting needs on average. Medications reviewed. Labs reviewed; Ca: 8.3 mg/dL.    6/16 - Per notes, pt had thoracentesis yesterday with 1.5 L removed.  - Per chart review, pt ate 80% of breakfast this AM which she reports was breakfast potatoes and [place] toast.  - Pt states that in the few months PTA she was prescribed antidepressant medications. - She states that being on these medications led to a loss of appetite and that she was not eating well most days.  - Pt has recently stopped taking 2 of the 3 antidepressants previously prescribed and plans to  stop taking the third one. - Pt states she was drinking Boost PTA when appetite and intakes were poor. - Pt feeling appetite and intakes are improved since hospitalization; she is agreeable to PRN nutrition supplements. - Physical assessment shows mild fat and moderate muscle wasting to upper body, did not assess lower body.  - Chart review indicates stable weight (140-146 lbs) over the past 1 year.  - Notes indicate that pt finished chemo in May 2017 and plan to start radiation in July 2017.   Diet Order:  Diet Heart Room service appropriate?: Yes; Fluid consistency:: Thin  Skin:  Reviewed, no issues  Last BM:  6/17  Height:   Ht Readings from Last 1 Encounters:  12/01/15 _0  (1.676 m)    Weight:   Wt Readings from Last 1 Encounters:  12/01/15 146 lb 9.7 oz (66.5 kg)    Ideal Body Weight:  59.09 kg (kg)  BMI:  Body mass index is 23.67 kg/(m^2).  Estimated Nutritional Needs:   Kcal:  1900-2100  Protein:  80-90 grams  Fluid:  2 L/day  EDUCATION NEEDS:   No education needs identified at this time     Jarome Matin, MS, RD, LDN Inpatient Clinical Dietitian Pager # [phone] After hours/weekend pager # [phone]

## 2015-12-05 NOTE — Progress Notes (Signed)
Name: Sheena Caldwell MRN: [id number] DOB: Mar 24, 1951    ADMISSION DATE:  11/30/2015 CONSULTATION DATE:  11/30/15  REFERRING MD :  Dr. Vanita Panda   CHIEF COMPLAINT:  Pleural Effusion    HISTORY OF PRESENT ILLNESS:  65 y/o F, former smoker,  with PMH of arthritis, COPD, and endometrial adenocarcinoma (treated with abdominal hysterectomy, omentectomy, chemotherapy and radiation last in 01/2015) thought to be in remission who presented to [facility] on 11/30/15 with reports of dyspnea and fatigue.    SUBJECTIVE:  No distress.  VITAL SIGNS: Temp:  [97.7 F (36.5 C)-99.2 F (37.3 C)] 97.7 F (36.5 C) (06/20 0830) Pulse Rate:  [72-98] 77 (06/20 0830) Resp:  [20-28] 28 (06/20 0830) BP: (73-91)/(45-66) 89/58 mmHg (06/20 0830) SpO2:  [88 %-100 %] 96 % (06/20 0830) Room air  PHYSICAL EXAMINATION: General:  Thin, chronically ill appearing female in NAD  Neuro:  AAOx4, speech clear, MAE  HEENT:  MM pink/moist, no JVD, can see port crossing over right clavicle, no LAN Cardiovascular:  S1S2 rrr, no m/r/g  Lungs:  Even/non-labored, diminished on L no accessory use; dressing intact Abdomen:  Soft, non-distended Musculoskeletal:  No acute deformities  Skin:  Warm/dry, no edema, rashes or lesions   Recent Labs Lab 12/03/15 0459 12/04/15 0448 12/05/15 0528  NA 137 134* 136  K 4.3 4.1 4.5  CL 103 101 104  CO2 27 26 27   BUN 10 9 11   CREATININE 0.81 0.69 0.63  GLUCOSE 102* 102* 109*    Recent Labs Lab 12/03/15 0459 12/04/15 0448 12/05/15 0528  HGB 9.3* 9.4* 9.1*  HCT 29.4* 29.0* 28.6*  WBC 5.2 5.3 5.3  PLT 414* 445* 389   Ir Guided Drain W Catheter Placement  12/04/2015  INDICATION: 65 year old with malignant left pleural effusion. History of endometrial cancer. EXAM: PLACEMENT OF TUNNELED LEFT PLEURAL CATHETER WITH ULTRASOUND AND FLUOROSCOPIC GUIDANCE MEDICATIONS: Ancef 2 g. ANESTHESIA/SEDATION: Fentanyl 0.5 mcg IV; Versed 50 mg IV Moderate Sedation Time:  23 minutes The patient was  continuously monitored during the procedure by the interventional radiology nurse under my direct supervision. COMPLICATIONS: None immediate. FLUOROSCOPY TIME:  36 seconds, 3.2 mGy PROCEDURE: Informed written consent was obtained from the patient after a thorough discussion of the procedural risks, benefits and alternatives. All questions were addressed. Maximal Sterile Barrier Technique was utilized including caps, mask, sterile gowns, sterile gloves, sterile drape, hand hygiene and skin antiseptic. A timeout was performed prior to the initiation of the procedure. Ultrasound confirmed a large amount of left pleural fluid. The left mid axillary region was prepped and draped in a sterile fashion. Skin was anesthetized with 1% lidocaine. Using ultrasound guidance, a Yueh catheter was directed into the pleural space and clear yellow fluid was aspirated. Approximately 2 rib spaces below the pleural catheter, the skin was anesthetized with 1% lidocaine and a small incision was made. A PleurX catheter was directed through this incision and tunneled to the Yueh catheter. The cuff was placed underneath the skin. Yueh catheter was removed over a stiff Amplatz wire. The tract was dilated to accommodate the peel-away sheath. The PleurX catheter was placed through the peel-away sheath and directed into the pleural space. Placement in the pleural space was confirmed with fluoroscopy. Catheter was attached to a vacuum bottle and approximately 800 mL of predominantly clear yellow fluid was removed. The drainage was stopped when the patient started to cough and the fluid turned pink. The pleural entry site was closed with absorbable suture and Dermabond. The catheter was  sutured to the skin with Prolene. Fluoroscopic and ultrasound images were taken and saved for documentation. FINDINGS: Large left pleural effusion.  800 mL of pleural fluid was removed. IMPRESSION: Successful placement of a tunneled left pleural catheter with  ultrasound and fluoroscopic guidance. Electronically Signed   By: Markus Daft M.D.   On: 12/04/2015 17:49   Dg Chest Port 1 View  12/04/2015  CLINICAL DATA:  Status post placement of left-sided PleurX catheter. Initial encounter. EXAM: PORTABLE CHEST 1 VIEW COMPARISON:  Chest radiograph performed 12/02/2015 FINDINGS: A left-sided chest catheter is noted overlying expected position. There is a residual moderate to large left-sided pleural effusion, mildly improved from the prior study, with associated airspace consolidation. The right lung appears grossly clear. The cardiomediastinal silhouette is enlarged, though not fully assessed. A right-sided chest port is noted ending about the distal SVC. No acute osseous abnormalities are seen. IMPRESSION: 1. Left-sided chest catheter noted overlying expected position. 2. Residual moderate to large left-sided pleural effusion, mildly improved from the prior study, with associated airspace consolidation. Would consider repositioning the catheter slightly, or pausing then restarting suction, as deemed clinically appropriate, to address the remaining pleural effusion. 3. Cardiomegaly. Electronically Signed   By: Garald Balding M.D.   On: 12/04/2015 18:28   SIGNIFICANT EVENTS  6/15  Admit with malaise/fatigue  STUDIES:  6/15  CXR >> moderate to large left pleural effusion with associated atelectasis / infiltrate in the LLL, and a nodular density in the RLL (~1.3 cm).   6/15  CT of the Chest >> significant progression of disease with numerous pulmonary nodules (largest measuring 3.1x2.6 cm), new pleural based mass along the medial aspects of the LUL measuring 2.6x1.7, mediastinal and perihilar LAN, large left pleural effusion, emphysematous changes of the upper lobes and no definite evidence of osseus metastases but scattered tiny lucent foci in the thoracic vertebral bodies could be early developing metastases.   ASSESSMENT / PLAN:  Discussion: 65 y/o F, former  smoker, with PMH of uterine cancer s/p hysterectomy / radiation thought to be in remission admitted 6/15 with fatigue. Found to have large left pleural effusion and progression of pulmonary nodules. Had thora w/ cytology that was c/w metastatic adenocarcinoma.   Metastatic adenocarcinoma H/o endometrial cancer Malignant Left Pleural Effusion  Multiple Pulmonary Nodules  Former Smoker  Plan: Drain pleur-x today F/u cxr in am and will need to establish her draining schedule Further rx per heme/onc  Erick Colace ACNP-BC [place] Pager # [phone] OR # [phone] if no answer

## 2015-12-05 NOTE — Care Management Note (Signed)
Case Management Note  Patient Details  Name: Sheena Caldwell MRN: [id number] Date of Birth: 09/18/1950  Subjective/Objective:  [facility] chosen for [facility] if needed for pleurx cath if [facility] rep Santiago Glad aware-will need HHRN, [id number]. Patient states she will stay w/her mother-[id number] [place] [phone].                  Action/Plan:d/c plan home   Expected Discharge Date:   (unknown)               Expected Discharge Plan:  [place]  In-House Referral:     Discharge planning Services  CM Consult  Post Acute Care Choice:    Choice offered to:  Patient  DME Arranged:    DME Agency:     HH Arranged:    [place] Agency:  [facility]  Status of Service:  In process, will continue to follow  Medicare Important Message Given:    Date Medicare IM Given:    Medicare IM give by:    Date Additional Medicare IM Given:    Additional Medicare Important Message give by:     If discussed at [place] of Stay Meetings, dates discussed:    Additional Comments:  Dessa Phi, RN 12/05/2015, 10:27 AM

## 2015-12-05 NOTE — Progress Notes (Signed)
  PROGRESS NOTE  Sheena Caldwell [id number] DOB: 09-01-1950 DOA: 11/30/2015 PCP: Ardith Dark, PA-C Marshell Garfinkel MD Gordy Levan, MD  Brief Narrative: 65 year old woman with history of endometrial cancer treated with chemotherapy, presented with 5 month history of progressive weakness, 2 month history of progressive shortness of breath. CT scan revealed left pleural effusion with multiple nodules.  Assessment/Plan: 1. Left pleural effusion, malignant. Status post large volume thoracentesis. Given recurrent fluid, Pleurx catheter placed 6/19.  2. Multiple lung nodules worrisome for metastatic disease. Oncology following. 3. Acute hypoxic respiratory failure secondary to effusion. Resolved status post Pleurx placement. 4. COPD, stable. Remains stable. 5. PMH uterine cancer, s/p  abdominal hysterectomy, omentectomy, chemotherapy and radiation last in 01/2015 6. Anemia, multifactorial. Stable. 7. Chronic low blood pressure per oncology, asymptomatic 8. Tobacco use disorder in remission 9. Non-severe (moderate) malnutrition in context of chronic illness   Appears much better today.  We'll discuss follow-up with oncology  Anticipate discharge 6/21  DVT prophylaxis: SCDs Code Status: full code Family Communication: none Disposition Plan: home  Murray Hodgkins, MD  [facility] Direct contact: [phone] --[place] [place]  --[email or web address]; password [email or web address]  7PM-7AM contact night coverage as above 12/05/2015, 4:52 PM  LOS: 5 days   Consultants:  Pulmonology  Oncology  Procedures:  6/15 thoracentesis: 1500 ml of cloudy amber pleural fluid was obtained. A sample was sent to Pathology, gram stain, cell counts, culture, LDH & protein as well as for infection analysis.  Antimicrobials:    HPI/Subjective: Feels much better today. Breathing better.  Objective: Filed Vitals:   12/04/15 2121 12/05/15 0525 12/05/15 0830 12/05/15 1350  BP: 91/66  89/58 90/64    Pulse: 85 76 77 76  Temp: 99 F (37.2 C) 98.3 F (36.8 C) 97.7 F (36.5 C) 98 F (36.7 C)  TempSrc: Oral Oral Oral Oral  Resp: 22 20 28 18   Height:      Weight:      SpO2: 94% 93% 96% 93%    Intake/Output Summary (Last 24 hours) at 12/05/15 1652 Last data filed at 12/05/15 1524  Gross per 24 hour  Intake   1185 ml  Output    475 ml  Net    710 ml     Filed Weights   11/30/15 1900 12/01/15 0900  Weight: 63.73 kg (140 lb 8 oz) 66.5 kg (146 lb 9.7 oz)    Exam: Constitutional:  . Appears calm and comfortable Cardiovascular:  . RRR, no m/r/g . No LE extremity edema   Psychiatric:  . judgement and insight appear normal . Mental status o Mood, affect appropriate  I have personally reviewed following labs and imaging studies:  BMP unremarkable.   Hemoglobin stable 9.1  Scheduled Meds: . docusate sodium  100 mg Oral Q12H  . enoxaparin (LOVENOX) injection  40 mg Subcutaneous Q24H  . ferrous sulfate  325 mg Oral Daily  . fludrocortisone  0.05 mg Oral Daily  . ketorolac  10 mg Oral Q8H  . mirtazapine  30 mg Oral QHS  . multivitamin with minerals  1 tablet Oral Daily   Continuous Infusions:   Active Problems:   Pleural effusion   Malignant pleural effusion   Hypoxia   Cachexia (HCC)   Moderate malnutrition (HCC)   LOS: 5 days   Time spent 20 minutes

## 2015-12-05 NOTE — Progress Notes (Signed)
Patient ID: Sheena Caldwell, female   DOB: Feb 01, 1951, 65 y.o.   MRN: [id number]    Referring Physician(s): Dr. Marshell Garfinkel  Supervising Physician: Corrie Mckusick  Patient Status: inpt  Chief Complaint: Malignant (L) effusion  Subjective: Patient with no new complaints. They have already used her Pleurx catheter today to drained 50 mL of fluid.  Allergies: Levaquin  Medications: Prior to Admission medications   Medication Sig Start Date End Date Taking? Authorizing Provider  albuterol (PROVENTIL HFA;VENTOLIN HFA) 108 (90 Base) MCG/ACT inhaler Inhale 1 puff into the lungs every 6 (six) hours as needed for wheezing or shortness of breath.   Yes Historical Provider, MD  Calcium Citrate-Vitamin D (CALCIUM + D PO) Take 1 tablet by mouth daily.   Yes Historical Provider, MD  cholecalciferol (VITAMIN D) 1000 units tablet Take 1,000 Units by mouth daily.   Yes Historical Provider, MD  ferrous sulfate 325 (65 FE) MG tablet Take 325 mg by mouth daily.   Yes Historical Provider, MD  fludrocortisone (FLORINEF) 0.1 MG tablet Take 0.05 mg by mouth daily.   Yes Historical Provider, MD  mirtazapine (REMERON) 30 MG tablet Take 30 mg by mouth at bedtime.   Yes Historical Provider, MD  Multiple Vitamin (MULTI-VITAMINS) TABS Take 1 tablet by mouth daily.   Yes Historical Provider, MD  polyethylene glycol (MIRALAX / GLYCOLAX) packet Take 17 g by mouth daily as needed.   Yes Historical Provider, MD  UNABLE TO FIND Med Name: smooth move drink for constipation   Yes Historical Provider, MD  docusate sodium (COLACE) 100 MG capsule Take 1 capsule (100 mg total) by mouth every 12 (twelve) hours. 12/25/14   Noemi Chapel, MD    Vital Signs: BP 90/64 mmHg  Pulse 76  Temp(Src) 98 F (36.7 C) (Oral)  Resp 18  Ht 5\' 6"  (1.676 m)  Wt 146 lb 9.7 oz (66.5 kg)  BMI 23.67 kg/m2  SpO2 93%  Physical Exam: Chest: Her catheter site is clean and dry. It remains covered. This is minimally tender. No evidence of  leakage or infection.  Imaging: Dg Chest 2 View  12/02/2015  CLINICAL DATA:  Pleural effusion.  Endometrial carcinoma. EXAM: CHEST  2 VIEW COMPARISON:  11/30/2015 chest radiograph. FINDINGS: Right internal jugular MediPort terminates in the lower third of the superior vena cava. Stable cardiomediastinal silhouette with mild cardiomegaly. No pneumothorax. No right pleural effusion. Moderate to large left pleural effusion is stable. No pulmonary edema. Complete lingular and left lower lobe atelectasis. Re- demonstration of multiple bilateral pulmonary nodules, unchanged. IMPRESSION: 1. Stable moderate to large left pleural effusion with complete lingular and left lower lobe atelectasis. 2. Re- demonstration of multiple bilateral pulmonary nodules consistent with pulmonary metastases. Electronically Signed   By: Ilona Sorrel M.D.   On: 12/02/2015 10:27   Ir Guided Niel Hummer W Catheter Placement  12/04/2015  INDICATION: 65 year old with malignant left pleural effusion. History of endometrial cancer. EXAM: PLACEMENT OF TUNNELED LEFT PLEURAL CATHETER WITH ULTRASOUND AND FLUOROSCOPIC GUIDANCE MEDICATIONS: Ancef 2 g. ANESTHESIA/SEDATION: Fentanyl 0.5 mcg IV; Versed 50 mg IV Moderate Sedation Time:  23 minutes The patient was continuously monitored during the procedure by the interventional radiology nurse under my direct supervision. COMPLICATIONS: None immediate. FLUOROSCOPY TIME:  36 seconds, 3.2 mGy PROCEDURE: Informed written consent was obtained from the patient after a thorough discussion of the procedural risks, benefits and alternatives. All questions were addressed. Maximal Sterile Barrier Technique was utilized including caps, mask, sterile gowns, sterile gloves, sterile drape, hand hygiene  and skin antiseptic. A timeout was performed prior to the initiation of the procedure. Ultrasound confirmed a large amount of left pleural fluid. The left mid axillary region was prepped and draped in a sterile fashion. Skin  was anesthetized with 1% lidocaine. Using ultrasound guidance, a Yueh catheter was directed into the pleural space and clear yellow fluid was aspirated. Approximately 2 rib spaces below the pleural catheter, the skin was anesthetized with 1% lidocaine and a small incision was made. A PleurX catheter was directed through this incision and tunneled to the Yueh catheter. The cuff was placed underneath the skin. Yueh catheter was removed over a stiff Amplatz wire. The tract was dilated to accommodate the peel-away sheath. The PleurX catheter was placed through the peel-away sheath and directed into the pleural space. Placement in the pleural space was confirmed with fluoroscopy. Catheter was attached to a vacuum bottle and approximately 800 mL of predominantly clear yellow fluid was removed. The drainage was stopped when the patient started to cough and the fluid turned pink. The pleural entry site was closed with absorbable suture and Dermabond. The catheter was sutured to the skin with Prolene. Fluoroscopic and ultrasound images were taken and saved for documentation. FINDINGS: Large left pleural effusion.  800 mL of pleural fluid was removed. IMPRESSION: Successful placement of a tunneled left pleural catheter with ultrasound and fluoroscopic guidance. Electronically Signed   By: Markus Daft M.D.   On: 12/04/2015 17:49   Dg Chest Port 1 View  12/04/2015  CLINICAL DATA:  Status post placement of left-sided PleurX catheter. Initial encounter. EXAM: PORTABLE CHEST 1 VIEW COMPARISON:  Chest radiograph performed 12/02/2015 FINDINGS: A left-sided chest catheter is noted overlying expected position. There is a residual moderate to large left-sided pleural effusion, mildly improved from the prior study, with associated airspace consolidation. The right lung appears grossly clear. The cardiomediastinal silhouette is enlarged, though not fully assessed. A right-sided chest port is noted ending about the distal SVC. No acute  osseous abnormalities are seen. IMPRESSION: 1. Left-sided chest catheter noted overlying expected position. 2. Residual moderate to large left-sided pleural effusion, mildly improved from the prior study, with associated airspace consolidation. Would consider repositioning the catheter slightly, or pausing then restarting suction, as deemed clinically appropriate, to address the remaining pleural effusion. 3. Cardiomegaly. Electronically Signed   By: Garald Balding M.D.   On: 12/04/2015 18:28    Labs:  CBC:  Recent Labs  12/02/15 0517 12/03/15 0459 12/04/15 0448 12/05/15 0528  WBC 5.7 5.2 5.3 5.3  HGB 9.2* 9.3* 9.4* 9.1*  HCT 28.6* 29.4* 29.0* 28.6*  PLT 409* 414* 445* 389    COAGS:  Recent Labs  11/30/15 1036  INR 1.31    BMP:  Recent Labs  12/02/15 0517 12/03/15 0459 12/04/15 0448 12/05/15 0528  NA 136 137 134* 136  K 4.0 4.3 4.1 4.5  CL 103 103 101 104  CO2 27 27 26 27   GLUCOSE 102* 102* 102* 109*  BUN 9 10 9 11   CALCIUM 8.3* 8.3* 8.2* 8.3*  CREATININE 0.68 0.81 0.69 0.63  GFRNONAA >60 >60 >60 >60  GFRAA >60 >60 >60 >60    LIVER FUNCTION TESTS:  Recent Labs  09/07/15 0916  10/02/15 1330 11/30/15 1036 11/30/15 1729 12/01/15 0442  BILITOT <0.30  --  <0.30 0.4  --  0.2*  AST 24  --  15 25  --  37  ALT 34  --  12 24  --  34  ALKPHOS 118  --  91 106  --  113  PROT 9.0*  < > 8.9* 8.4* 8.6* 7.8  ALBUMIN 2.6*  --  2.6* 2.5*  --  2.4*  < > = values in this interval not displayed.  Assessment and Plan: 1. S/p PleurX catheter placement for a malignant left pleural effusion, June 19, [place] -Her site is clean. Home health has been arranged to follow this catheter. -Please call us with any further questions.  Electronically Signed: Henreitta Cea 12/05/2015, 3:38 PM   I spent a total of 15 Minutes at the the patient's bedside AND on the patient's hospital floor or unit, greater than 50% of which was counseling/coordinating care for malignant left pleural  effusion

## 2015-12-06 ENCOUNTER — Inpatient Hospital Stay (HOSPITAL_COMMUNITY): Payer: Medicaid Other

## 2015-12-06 DIAGNOSIS — R911 Solitary pulmonary nodule: Secondary | ICD-10-CM

## 2015-12-06 LAB — CBC
HCT: 27.4 % — ABNORMAL LOW (ref 36.0–46.0)
HEMOGLOBIN: 8.8 g/dL — AB (ref 12.0–15.0)
MCH: 29.4 pg (ref 26.0–34.0)
MCHC: 32.1 g/dL (ref 30.0–36.0)
MCV: 91.6 fL (ref 78.0–100.0)
PLATELETS: 427 10*3/uL — AB (ref 150–400)
RBC: 2.99 MIL/uL — AB (ref 3.87–5.11)
RDW: 17.1 % — ABNORMAL HIGH (ref 11.5–15.5)
WBC: 5.6 10*3/uL (ref 4.0–10.5)

## 2015-12-06 LAB — BASIC METABOLIC PANEL
Anion gap: 5 (ref 5–15)
BUN: 10 mg/dL (ref 6–20)
CHLORIDE: 104 mmol/L (ref 101–111)
CO2: 29 mmol/L (ref 22–32)
Calcium: 8.3 mg/dL — ABNORMAL LOW (ref 8.9–10.3)
Creatinine, Ser: 0.75 mg/dL (ref 0.44–1.00)
Glucose, Bld: 97 mg/dL (ref 65–99)
POTASSIUM: 4.3 mmol/L (ref 3.5–5.1)
SODIUM: 138 mmol/L (ref 135–145)

## 2015-12-06 NOTE — Discharge Summary (Signed)
Physician Discharge Summary  Sheena Caldwell [id number] DOB: 18-Jun-1950 DOA: 11/30/2015  PCP: Ardith Dark, PA-C  Admit date: 11/30/2015 Discharge date: 12/06/2015  Recommendations for Outpatient Follow-up:  1. Follow-up malignant left pleural effusion. Pleurx catheter in place. Home health. 2. Multiple lung nodules worrisome for metastatic disease 3. Multifactorial anemia 4. Moderate malnutrition   Follow-up Information    Follow up with HEDGECOCK,SUZANNE, PA-C.   Specialty:  Physician Assistant   Why:  As needed   Contact information:   [id number] [place] [address] [phone]       Follow up with Gordy Levan, MD On 12/15/2015.   Specialty:  Oncology   Contact information:   [address] [place] [address] [phone]       Follow up with Marshell Garfinkel, MD. Schedule an appointment as soon as possible for a visit in 4 weeks.   Specialty:  Pulmonary Disease   Contact information:   [address] [place] [address] [phone]       Follow up with [place].   Why:  [facility]   Contact information:   [address] [place]  [address] [phone]      Discharge Diagnoses:  1. Malignant left pleural effusion 2. Multiple lung nodules 3. Acute hypoxic respiratory failure 4. COPD 5. Uterine cancer 6. Multifactorial anemia 7. Tobacco use disorder in remission 8. Moderate malnutrition  Discharge Condition: improved Disposition: home  Diet recommendation: regular  Filed Weights   11/30/15 1900 12/01/15 0900  Weight: 63.73 kg (140 lb 8 oz) 66.5 kg (146 lb 9.7 oz)    History of present illness:  65 year old woman with history of endometrial cancer treated with chemotherapy, presented with 5 month history of progressive weakness, 2 month history of progressive shortness of breath. CT scan revealed left pleural effusion with multiple nodules.  Hospital Course:  Patient was  treated with supportive care, seen by pulmonology and underwent large volume thoracentesis. 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 Fluid rapidly reaccumulated, patient subsequently underwent Pleurx catheter placement and is now asymptomatic. Seen by pulmonology and oncology. At this point recommendations for outpatient follow-up with oncology and per pulmonology Dr. Lake Bells, home health will coordinate with Dr. Mariana Kaufman office for management of the Pleurx catheter.  1. Left pleural effusion, malignant. Status post large volume thoracentesis. Given recurrent fluid, Pleurx catheter placed 6/19. Now asymptomatic despite chest x-ray findings. Discussed with radiology, may reflect atelectasis. 2. Multiple lung nodules worrisome for metastatic disease. Oncology will follow-up as an outpatient next week. 3. Acute hypoxic respiratory failure secondary to effusion. Resolved status post Pleurx placement. 4. COPD, stable.  5. PMH uterine cancer, s/p abdominal hysterectomy, omentectomy, chemotherapy and radiation last in 01/2015 6. Anemia, multifactorial. Stable. 7. Chronic low blood pressure per oncology, asymptomatic 8. Tobacco use disorder in remission 9. Non-severe (moderate) malnutrition in context of chronic illness  Consultants:  Pulmonology  Oncology  Procedures:  6/15 thoracentesis: 1500 ml of cloudy amber pleural fluid was obtained. A sample was sent to Pathology, gram stain, cell counts, culture, LDH & protein as well as for infection analysis.  6/19 Pleurx catheter placement   Discharge Instructions  Discharge Instructions    Diet general    Complete by:  As directed      Discharge instructions    Complete by:  As directed   Call your physician or seek immediate medical attention for shortness of breath, pain, wheezing, fatigue, lethargy or worsening of condition.  Increase activity slowly    Complete by:  As directed           Discharge Medication List as  of 12/06/2015  1:50 PM    CONTINUE these medications which have NOT CHANGED   Details  albuterol (PROVENTIL HFA;VENTOLIN HFA) 108 (90 Base) MCG/ACT inhaler Inhale 1 puff into the lungs every 6 (six) hours as needed for wheezing or shortness of breath., Until Discontinued, Historical Med    Calcium Citrate-Vitamin D (CALCIUM + D PO) Take 1 tablet by mouth daily., Until Discontinued, Historical Med    cholecalciferol (VITAMIN D) 1000 units tablet Take 1,000 Units by mouth daily., Until Discontinued, Historical Med    ferrous sulfate 325 (65 FE) MG tablet Take 325 mg by mouth daily., Until Discontinued, Historical Med    fludrocortisone (FLORINEF) 0.1 MG tablet Take 0.05 mg by mouth daily., Until Discontinued, Historical Med    mirtazapine (REMERON) 30 MG tablet Take 30 mg by mouth at bedtime., Until Discontinued, Historical Med    Multiple Vitamin (MULTI-VITAMINS) TABS Take 1 tablet by mouth daily., Until Discontinued, Historical Med    polyethylene glycol (MIRALAX / GLYCOLAX) packet Take 17 g by mouth daily as needed., Until Discontinued, Historical Med    UNABLE TO FIND Med Name: smooth move drink for constipation, Until Discontinued, Historical Med    docusate sodium (COLACE) 100 MG capsule Take 1 capsule (100 mg total) by mouth every 12 (twelve) hours., Starting 12/25/2014, Until Discontinued, Print       Allergies  Allergen Reactions  . Levaquin [Levofloxacin In D5w] Other (See Comments)    "I just felt horrible"    The results of significant diagnostics from this hospitalization (including imaging, microbiology, ancillary and laboratory) are listed below for reference.    Significant Diagnostic Studies: Dg Chest 2 View  12/02/2015  CLINICAL DATA:  Pleural effusion.  Endometrial carcinoma. EXAM: CHEST  2 VIEW COMPARISON:  11/30/2015 chest radiograph. FINDINGS: Right internal jugular MediPort terminates in the lower third of the superior vena cava. Stable cardiomediastinal  silhouette with mild cardiomegaly. No pneumothorax. No right pleural effusion. Moderate to large left pleural effusion is stable. No pulmonary edema. Complete lingular and left lower lobe atelectasis. Re- demonstration of multiple bilateral pulmonary nodules, unchanged. IMPRESSION: 1. Stable moderate to large left pleural effusion with complete lingular and left lower lobe atelectasis. 2. Re- demonstration of multiple bilateral pulmonary nodules consistent with pulmonary metastases. Electronically Signed   By: Ilona Sorrel M.D.   On: 12/02/2015 10:27   Dg Chest 2 View  11/30/2015  CLINICAL DATA:  Shortness of Breath, history of uterine cancer, post hysterectomy EXAM: CHEST  2 VIEW COMPARISON:  09/27/2014 FINDINGS: Borderline cardiomegaly. There is moderate to large left pleural effusion. Atelectasis or infiltrate is noted in left lower lobe. No pulmonary edema. Stable right IJ Port-A-Cath position. There is a nodular density in right lower lobe measure about 1.3 cm. Further correlation with CT scan of the chest is recommended. IMPRESSION: There is moderate to large left pleural effusion. Atelectasis or infiltrate is noted in left lower lobe. No pulmonary edema. Stable right IJ Port-A-Cath position. There is a nodular density in right lower lobe measure about 1.3 cm. Further evaluation with CT scan of the chest is recommended. Electronically Signed   By: Lahoma Crocker M.D.   On: 11/30/2015 11:04   Ct Chest W Contrast  11/30/2015  CLINICAL DATA:  Weakness, shortness of breath for 4-5 months, worsening. Intermittent dry cough. History of endometrial carcinoma diagnosed  in October of 2015. EXAM: CT CHEST WITH CONTRAST TECHNIQUE: Multidetector CT imaging of the chest was performed during intravenous contrast administration. CONTRAST:  6mL ISOVUE-300 IOPAMIDOL (ISOVUE-300) INJECTION 61% COMPARISON:  Chest CT dated 11/04/2014. FINDINGS: Mediastinum/Lymph Nodes: New enlarged and centrally necrotic lymph nodes are seen  in the right hilum, measuring 1.8 and 1.3 cm short axis dimension. Probable additional centrally necrotic lymph node is seen within the left lower perihilar region measuring 1.6 cm short axis dimension. Numerous additional lymph nodes are seen within the mediastinum, most prominently in the left paratracheal and aortopulmonary window regions, certainly pathologic by number. Configuration of the thoracic aorta is stable, with associated mild atherosclerotic change. Heart size is normal although displaced to the right by the large left pleural effusion. Lungs/Pleura: There were several small pulmonary nodules identified on chest CT of 11/04/2014, largest measuring 6 mm. There has been marked progression of disease since that previous chest CT. There are multiple new and larger pulmonary nodules/masses within the right lung. This includes a 3.1 x 2.6 cm mass at the right lung base posteriorly (series 5, image 121), a 1.8 x 1.6 cm nodule within the right lower lobe posteriorly (series 5, image 103), a 1.3 x 1.3 cm nodule within the posterior-lateral portion of the right lower lobe (series 5, image 96), an irregular nodule within the superior segment of the right lower lobe measuring 1.2 x 0.9 cm (series 5, image 79) and a pleural based nodule adjacent to the right major fissure measuring 1.3 x 0.8 cm (series 5, image 67). There is a new large left pleural effusion with adjacent compressive atelectasis. Pleural based mass within the left upper lobe medially measures 2.6 x 1.7 cm (series 5, image 48). There is also irregular pleural thickening along the medial and anterior aspects of the left upper lobe, highly suggestive of neoplastic involvement. Emphysematous changes noted bilaterally, upper lobe predominant. Upper abdomen: Limited images of the upper abdomen are unremarkable. Musculoskeletal: Scattered tiny lucent foci are seen within the thoracic vertebral bodies, too small to definitively characterize, possibly  early developing lytic metastases. No definite evidence of osseous metastasis identified. IMPRESSION: 1. Significant progression of disease, as detailed above. This includes numerous pulmonary masses/nodules within the right lung, largest of which is at the right lung base measuring 3.1 x 2.6 cm. A new pleural based mass is now seen along the medial aspects of the left upper lobe measuring 2.6 x 1.7 cm. On earlier chest CT of 11/04/2014, the largest pulmonary nodule identified within the left lung was 6 mm and the largest pulmonary nodule identified within the right lung was 4 mm. 2. Mediastinal and perihilar lymphadenopathy, most numerous within the left lower paratracheal and aortopulmonary window regions, with largest lymph nodes in the right perihilar region demonstrating evidence of central necrosis, almost certainly compatible with metastatic lymphadenopathy throughout and further evidence of progression of disease. 3. Large left pleural effusion, likely malignant effusion, with adjacent compressive atelectasis. Heart is displaced to the right by the large left pleural effusion. 4. Emphysematous change, upper lobe predominant. 5. No definite evidence of osseous metastasis. Scattered tiny lucent foci within the thoracic vertebral bodies could conceivably represent early developing metastases. These results were called by telephone at the time of interpretation on 11/30/2015 at 12:39 pm to Dr. Carmin Muskrat , who verbally acknowledged these results. Electronically Signed   By: Franki Cabot M.D.   On: 11/30/2015 12:43   Ct Abdomen Pelvis W Contrast  12/01/2015  CLINICAL DATA:  Uterine  cancer diagnosed in 2015. Chemotherapy completed May 2016 and radiation therapy in August 2016. Pleural effusion and pulmonary nodularity on chest CT. EXAM: CT ABDOMEN AND PELVIS WITH CONTRAST TECHNIQUE: Multidetector CT imaging of the abdomen and pelvis was performed using the standard protocol following bolus administration  of intravenous contrast. CONTRAST:  124mL ISOVUE-300 IOPAMIDOL (ISOVUE-300) INJECTION 61% COMPARISON:  Chest CT 11/30/2015.  Abdominal pelvic CT 12/25/2014 FINDINGS: Lower chest: As seen on yesterday's chest CT, there is a large left pleural effusion with several pleural-based nodules in the left hemithorax consistent with metastatic disease. These measure up to 1.4 cm on image 10. There is no significant pleural fluid on the right. There are right lower lobe pulmonary nodules measuring up to 10 x 11 mm on image number 5 and 2.9 x 4.4 cm on image number 40. The left lower lobe remains collapsed. There is a probable 1.6 cm left lower lobe nodule on image 11. Right infrahilar adenopathy is partially imaged. Hepatobiliary: There is stable low-density in the left hepatic lobe adjacent to the falciform ligament on image 40, likely focal fat. No new or suspicious hepatic findings. No evidence of gallstones, gallbladder wall thickening or biliary dilatation. Pancreas: Unremarkable. No pancreatic ductal dilatation or surrounding inflammatory changes. Spleen: Normal in size without focal abnormality. Adrenals/Urinary Tract: Both adrenal glands appear normal. Grossly stable bilateral renal cysts. No evidence of renal mass, urinary tract calculus or hydronephrosis. The bladder appears unremarkable. Stomach/Bowel: No evidence of bowel wall thickening, distention or surrounding inflammatory change. Moderate stool throughout the colon. Vascular/Lymphatic: There is a new mildly enlarged left periaortic node measuring 12 mm on image 48. There is a large nodal mass along the left pelvic sidewall, measuring 6.2 x 2.9 cm on image 73. There is aortic and branch vessel atherosclerosis. No evidence of large vessel occlusion. Reproductive: Hysterectomy.  No evidence of adnexal mass. Other: No ascites or peritoneal nodularity. Musculoskeletal: No acute or significant osseous findings. IMPRESSION: 1. As seen on yesterday's chest CT, there  is metastatic disease to both lung bases with a large malignant left pleural effusion. There are several pleural-based metastases on the left and right infrahilar lymphadenopathy, incompletely visualized. 2. Left periaortic and left pelvic side wall lymphadenopathy consistent with metastatic disease. No other evidence of abdominal pelvic metastatic disease. 3. No hydronephrosis. Electronically Signed   By: Richardean Sale M.D.   On: 12/01/2015 11:59   Ir Guided Niel Hummer W Catheter Placement  12/04/2015  INDICATION: 65 year old with malignant left pleural effusion. History of endometrial cancer. EXAM: PLACEMENT OF TUNNELED LEFT PLEURAL CATHETER WITH ULTRASOUND AND FLUOROSCOPIC GUIDANCE MEDICATIONS: Ancef 2 g. ANESTHESIA/SEDATION: Fentanyl 0.5 mcg IV; Versed 50 mg IV Moderate Sedation Time:  23 minutes The patient was continuously monitored during the procedure by the interventional radiology nurse under my direct supervision. COMPLICATIONS: None immediate. FLUOROSCOPY TIME:  36 seconds, 3.2 mGy PROCEDURE: Informed written consent was obtained from the patient after a thorough discussion of the procedural risks, benefits and alternatives. All questions were addressed. Maximal Sterile Barrier Technique was utilized including caps, mask, sterile gowns, sterile gloves, sterile drape, hand hygiene and skin antiseptic. A timeout was performed prior to the initiation of the procedure. Ultrasound confirmed a large amount of left pleural fluid. The left mid axillary region was prepped and draped in a sterile fashion. Skin was anesthetized with 1% lidocaine. Using ultrasound guidance, a Yueh catheter was directed into the pleural space and clear yellow fluid was aspirated. Approximately 2 rib spaces below the pleural catheter, the skin  was anesthetized with 1% lidocaine and a small incision was made. A PleurX catheter was directed through this incision and tunneled to the Yueh catheter. The cuff was placed underneath the skin.  Yueh catheter was removed over a stiff Amplatz wire. The tract was dilated to accommodate the peel-away sheath. The PleurX catheter was placed through the peel-away sheath and directed into the pleural space. Placement in the pleural space was confirmed with fluoroscopy. Catheter was attached to a vacuum bottle and approximately 800 mL of predominantly clear yellow fluid was removed. The drainage was stopped when the patient started to cough and the fluid turned pink. The pleural entry site was closed with absorbable suture and Dermabond. The catheter was sutured to the skin with Prolene. Fluoroscopic and ultrasound images were taken and saved for documentation. FINDINGS: Large left pleural effusion.  800 mL of pleural fluid was removed. IMPRESSION: Successful placement of a tunneled left pleural catheter with ultrasound and fluoroscopic guidance. Electronically Signed   By: Markus Daft M.D.   On: 12/04/2015 17:49   Dg Chest Port 1 View  12/06/2015  CLINICAL DATA:  Follow-up left pleural effusion, history of COPD, uterine malignancy, former smoker. EXAM: PORTABLE CHEST 1 VIEW COMPARISON:  Portable chest x-ray of December 04, 2015 FINDINGS: The right lung is well-expanded and clear. On the left there is stable volume loss due to a large pleural effusion. The left-sided chest tube tip projects over the posterior aspect of the fifth rib. This has not greatly changed since the previous study. There is minimal shift of the mediastinum toward the right. There is prominence of the aortic arch with mural calcification. The Port-A-Cath appliance tip projects over the midportion of the SVC. The cardiac silhouette remains enlarged though the heart border on the left is obscured. The pulmonary vascularity is not engorged. IMPRESSION: 1. Stable large left pleural effusion. No pneumothorax. The left-sided chest tube is in stable position. 2. Aortic atherosclerosis. Electronically Signed   By: David  Martinique M.D.   On: 12/06/2015  07:33   Dg Chest Port 1 View  12/04/2015  CLINICAL DATA:  Status post placement of left-sided PleurX catheter. Initial encounter. EXAM: PORTABLE CHEST 1 VIEW COMPARISON:  Chest radiograph performed 12/02/2015 FINDINGS: A left-sided chest catheter is noted overlying expected position. There is a residual moderate to large left-sided pleural effusion, mildly improved from the prior study, with associated airspace consolidation. The right lung appears grossly clear. The cardiomediastinal silhouette is enlarged, though not fully assessed. A right-sided chest port is noted ending about the distal SVC. No acute osseous abnormalities are seen. IMPRESSION: 1. Left-sided chest catheter noted overlying expected position. 2. Residual moderate to large left-sided pleural effusion, mildly improved from the prior study, with associated airspace consolidation. Would consider repositioning the catheter slightly, or pausing then restarting suction, as deemed clinically appropriate, to address the remaining pleural effusion. 3. Cardiomegaly. Electronically Signed   By: Garald Balding M.D.   On: 12/04/2015 18:28   Dg Chest Port 1 View  11/30/2015  CLINICAL DATA:  Status post thoracentesis EXAM: PORTABLE CHEST 1 VIEW COMPARISON:  11/30/2015 FINDINGS: Cardiac shadow is stable. A right chest wall port is noted. The right lung is clear with the exception of a focal nodule projecting over the lung base stable from the prior CT examination. There has been interval it thoracentesis on the left with reduction in the amount of fluid although a large pleural effusion remains. No pneumothorax is noted. IMPRESSION: No evidence of pneumothorax following thoracentesis. A  significant left pleural effusion remains. Changes consistent with mass lesion in the right lung base. Electronically Signed   By: Inez Catalina M.D.   On: 11/30/2015 18:43    Microbiology: Recent Results (from the past 240 hour(s))  Culture, body fluid-bottle     Status:  None   Collection Time: 11/30/15  4:40 PM  Result Value Ref Range Status   Specimen Description FLUID PLEURAL LEFT  Final   Special Requests BOTTLES DRAWN AEROBIC ONLY 6CC  Final   Gram Stain   Final    CYTOSPIN SMEAR WBC PRESENT,BOTH PMN AND MONONUCLEAR NO ORGANISMS SEEN    Culture   Final    NO GROWTH 5 DAYS Performed at [facility]    Report Status 12/05/2015 FINAL  Final     Labs: Basic Metabolic Panel:  Recent Labs Lab 11/30/15 1036  12/02/15 0517 12/03/15 0459 12/04/15 0448 12/05/15 0528 12/06/15 0425  NA 136  < > 136 137 134* 136 138  K 3.6  < > 4.0 4.3 4.1 4.5 4.3  CL 103  < > 103 103 101 104 104  CO2 25  < > 27 27 26 27 29   GLUCOSE 146*  < > 102* 102* 102* 109* 97  BUN 11  < > 9 10 9 11 10   CREATININE 0.91  < > 0.68 0.81 0.69 0.63 0.75  CALCIUM 8.6*  < > 8.3* 8.3* 8.2* 8.3* 8.3*  MG 1.9  --   --   --   --   --   --   PHOS 2.5  --   --   --   --   --   --   < > = values in this interval not displayed. Liver Function Tests:  Recent Labs Lab 11/30/15 1036 11/30/15 1729 12/01/15 0442  AST 25  --  37  ALT 24  --  34  ALKPHOS 106  --  113  BILITOT 0.4  --  0.2*  PROT 8.4* 8.6* 7.8  ALBUMIN 2.5*  --  2.4*   CBC:  Recent Labs Lab 12/02/15 0517 12/03/15 0459 12/04/15 0448 12/05/15 0528 12/06/15 0425  WBC 5.7 5.2 5.3 5.3 5.6  HGB 9.2* 9.3* 9.4* 9.1* 8.8*  HCT 28.6* 29.4* 29.0* 28.6* 27.4*  MCV 92.0 91.9 91.8 92.6 91.6  PLT 409* 414* 445* 389 427*   Cardiac Enzymes:  Recent Labs Lab 11/30/15 1036  TROPONINI <0.03     Recent Labs  11/30/15 1036  BNP 19.4     Active Problems:   Pleural effusion   Malignant pleural effusion   Hypoxia   Cachexia (HCC)   Moderate malnutrition ([place])   Time coordinating discharge: 35 minutes  Signed:  Murray Hodgkins, MD [facility] 12/06/2015, 4:04 PM

## 2015-12-06 NOTE — Progress Notes (Signed)
Reviewed discharge information with patient. Answered all questions. patientable to teach back medications and reasons to contact MD/911. Patient verbalizes importance of PCP follow up appointment. [place] services arranged and patient sent with pleurex drainage supplies.  Barbee Shropshire. Brigitte Pulse, RN

## 2015-12-06 NOTE — Progress Notes (Signed)
PROGRESS NOTE  Sheena Caldwell [id number] DOB: 1950-11-18 DOA: 11/30/2015 PCP: Ardith Dark, PA-C Marshell Garfinkel MD Gordy Levan, MD  Brief Narrative: 65 year old woman with history of endometrial cancer treated with chemotherapy, presented with 5 month history of progressive weakness, 2 month history of progressive shortness of breath. CT scan revealed left pleural effusion with multiple nodules.  Assessment/Plan: 1. Left pleural effusion, malignant. Status post large volume thoracentesis. Given recurrent fluid, Pleurx catheter placed 6/19. Now asymptomatic despite chest x-ray findings. Discussed with radiology, may reflect atelectasis. 2. Multiple lung nodules worrisome for metastatic disease. Oncology will follow-up as an outpatient next week. 3. Acute hypoxic respiratory failure secondary to effusion. Resolved status post Pleurx placement. 4. COPD, stable.  5. PMH uterine cancer, s/p  abdominal hysterectomy, omentectomy, chemotherapy and radiation last in 01/2015 6. Anemia, multifactorial. Stable. 7. Chronic low blood pressure per oncology, asymptomatic 8. Tobacco use disorder in remission 9. Non-severe (moderate) malnutrition in context of chronic illness   Asymptomatic. Chest x-ray findings may reflect atelectasis. Discussed imaging with radiology, interventional radiology Rowe Robert has reassessed and d/w Dr. Kathlene Cote and recommends against manipulation of the catheter. Patient was counseled to watch for shortness of breath and fatigue. Plan for discharge home today with home health. Patient to drain catheter in 48 hours.  DVT prophylaxis: SCDs Code Status: full code Family Communication: none Disposition Plan: home  Murray Hodgkins, MD  [facility] Direct contact: [phone] --[place]ia [place]  --[email or web address]; password [email or web address]  7PM-7AM contact night coverage as above 12/06/2015, 4:01 PM  LOS: 6 days    Consultants:  Pulmonology  Oncology  Procedures:  6/15 thoracentesis: 1500 ml of cloudy amber pleural fluid was obtained. A sample was sent to Pathology, gram stain, cell counts, culture, LDH & protein as well as for infection analysis.  Antimicrobials:    HPI/Subjective: Feeling well. Ambulate in the hall without difficulty. No complaints.  Objective: Filed Vitals:   12/05/15 0830 12/05/15 1350 12/05/15 2045 12/06/15 0500  BP: 89/58 90/64 87/58  89/63  Pulse: 77 76 96 88  Temp: 97.7 F (36.5 C) 98 F (36.7 C) 99.5 F (37.5 C) 98.8 F (37.1 C)  TempSrc: Oral Oral Oral Oral  Resp: 28 18 18 18   Height:      Weight:      SpO2: 96% 93% 93% 95%    Intake/Output Summary (Last 24 hours) at 12/06/15 1601 Last data filed at 12/06/15 0819  Gross per 24 hour  Intake    240 ml  Output    600 ml  Net   -360 ml     Filed Weights   11/30/15 1900 12/01/15 0900  Weight: 63.73 kg (140 lb 8 oz) 66.5 kg (146 lb 9.7 oz)    Exam: Constitutional:  . Appears calm and comfortable Respiratory:  . CTA bilaterally, no w/r/r.  . Respiratory effort normal. No retractions or accessory muscle use Cardiovascular:  . RRR, no m/r/g Psychiatric:  . judgement and insight appear normal . Mental status o Mood, affect appropriate  I have personally reviewed following labs and imaging studies:  Chest x-ray with large left pleural effusion.  Basic metabolic panel unremarkable.  Hemoglobin stable at 8.8  Scheduled Meds: . docusate sodium  100 mg Oral Q12H  . enoxaparin (LOVENOX) injection  40 mg Subcutaneous Q24H  . ferrous sulfate  325 mg Oral Daily  . fludrocortisone  0.05 mg Oral Daily  . ketorolac  10 mg Oral Q8H  . mirtazapine  30 mg Oral QHS  .  multivitamin with minerals  1 tablet Oral Daily   Continuous Infusions:   Active Problems:   Pleural effusion   Malignant pleural effusion   Hypoxia   Cachexia (HCC)   Moderate malnutrition (HCC)   LOS: 6 days

## 2015-12-06 NOTE — Care Management Note (Signed)
Case Management Note  Patient Details  Name: Sheena Caldwell MRN: [id number] Date of Birth: [birth year]  Subjective/Objective:[facility] rep Santiago Glad aware of d/c HHRN orders. Nsg notified that pleurx cannisters in rm must go home with patient.                    Action/Plan:d/c home w/[facility]HC.  Expected Discharge Date:   (unknown)               Expected Discharge Plan:  [place]  In-House Referral:     Discharge planning Services  CM Consult  Post Acute Care Choice:    Choice offered to:  Patient  DME Arranged:    DME Agency:     HH Arranged:  RN [place] Agency:  [place]  Status of Service:  Completed, signed off  If discussed at [place] of Stay Meetings, dates discussed:    Additional Comments:  Dessa Phi, RN 12/06/2015, 12:19 PM

## 2015-12-11 ENCOUNTER — Telehealth: Payer: Self-pay | Admitting: *Deleted

## 2015-12-11 NOTE — Telephone Encounter (Signed)
Faxed completed forms to [facility]

## 2015-12-12 ENCOUNTER — Telehealth: Payer: Self-pay

## 2015-12-12 NOTE — Telephone Encounter (Signed)
-----   Message from Gordy Levan, MD sent at 12/11/2015  4:57 PM EDT ----- DC from hospital last week with pleurex drain. I believe [place] involved; pulmonary MD sent me message today that [facility] cannot manage the pleurex.  RN please find out if [facility] has orders for the pleurex, if they have gotten bottles and other equipment needed for pleurex, and if they have drained it how much and when.   thanks

## 2015-12-12 NOTE — Telephone Encounter (Signed)
S/w [facility] and they have received the faxed orders for the pleurx drian kits. Verified they will deliver pleurx drain kits to her mother's address. Turn around time is about 3 days.

## 2015-12-12 NOTE — Telephone Encounter (Signed)
S/w pt. She is now at home, not her mother's. [facility] was out Sunday and drained very little from abdomen. Plan at present is not to drain every other day. Discussed having [facility] help with draining when she is symptomatic. Pt reitterated her appt on Friday. Told pt that [facility] would be delivering pleurex drain kits to her mother's address. Pt was OK with this and will inform her mother.

## 2015-12-12 NOTE — Telephone Encounter (Signed)
-----   Message from Gordy Levan, MD sent at 12/11/2015  4:57 PM EDT ----- DC from hospital last week with pleurex drain. I believe [place] involved; pulmonary MD sent me message today that [facility] cannot manage the pleurex.  RN please find out if [facility] has orders for the pleurex, if they have gotten bottles and other equipment needed for pleurex, and if they have drained it how much and when.   thanks

## 2015-12-13 NOTE — Telephone Encounter (Signed)
S/w Amy Burchette RN at [facility]. She states that [facility] will continue calling pt about her pleurx drain and visit when pt requires it. Pt was concerned with cost of the kits and not symptomatic and not wanting to be drained every 2 days.

## 2015-12-14 ENCOUNTER — Other Ambulatory Visit: Payer: Self-pay | Admitting: Oncology

## 2015-12-14 DIAGNOSIS — C541 Malignant neoplasm of endometrium: Secondary | ICD-10-CM

## 2015-12-14 DIAGNOSIS — D500 Iron deficiency anemia secondary to blood loss (chronic): Secondary | ICD-10-CM

## 2015-12-15 ENCOUNTER — Ambulatory Visit (HOSPITAL_BASED_OUTPATIENT_CLINIC_OR_DEPARTMENT_OTHER): Payer: Medicaid Other | Admitting: Oncology

## 2015-12-15 ENCOUNTER — Telehealth: Payer: Self-pay

## 2015-12-15 ENCOUNTER — Encounter: Payer: Self-pay | Admitting: Oncology

## 2015-12-15 ENCOUNTER — Ambulatory Visit (HOSPITAL_BASED_OUTPATIENT_CLINIC_OR_DEPARTMENT_OTHER): Payer: Medicaid Other

## 2015-12-15 VITALS — BP 106/79 | HR 95 | Temp 98.4°F | Resp 18 | Wt 140.0 lb

## 2015-12-15 DIAGNOSIS — J910 Malignant pleural effusion: Secondary | ICD-10-CM

## 2015-12-15 DIAGNOSIS — R770 Abnormality of albumin: Secondary | ICD-10-CM | POA: Diagnosis not present

## 2015-12-15 DIAGNOSIS — Z95828 Presence of other vascular implants and grafts: Secondary | ICD-10-CM

## 2015-12-15 DIAGNOSIS — C541 Malignant neoplasm of endometrium: Secondary | ICD-10-CM | POA: Diagnosis present

## 2015-12-15 DIAGNOSIS — D509 Iron deficiency anemia, unspecified: Secondary | ICD-10-CM

## 2015-12-15 DIAGNOSIS — D6489 Other specified anemias: Secondary | ICD-10-CM

## 2015-12-15 DIAGNOSIS — R7982 Elevated C-reactive protein (CRP): Secondary | ICD-10-CM

## 2015-12-15 DIAGNOSIS — Z87891 Personal history of nicotine dependence: Secondary | ICD-10-CM

## 2015-12-15 DIAGNOSIS — I959 Hypotension, unspecified: Secondary | ICD-10-CM

## 2015-12-15 DIAGNOSIS — D500 Iron deficiency anemia secondary to blood loss (chronic): Secondary | ICD-10-CM

## 2015-12-15 DIAGNOSIS — C7800 Secondary malignant neoplasm of unspecified lung: Secondary | ICD-10-CM | POA: Diagnosis not present

## 2015-12-15 LAB — CBC WITH DIFFERENTIAL/PLATELET
BASO%: 1.3 % (ref 0.0–2.0)
BASOS ABS: 0.1 10*3/uL (ref 0.0–0.1)
EOS%: 1.2 % (ref 0.0–7.0)
Eosinophils Absolute: 0.1 10*3/uL (ref 0.0–0.5)
HEMATOCRIT: 28.5 % — AB (ref 34.8–46.6)
HEMOGLOBIN: 9.1 g/dL — AB (ref 11.6–15.9)
LYMPH#: 1.2 10*3/uL (ref 0.9–3.3)
LYMPH%: 24.2 % (ref 14.0–49.7)
MCH: 29.5 pg (ref 25.1–34.0)
MCHC: 31.8 g/dL (ref 31.5–36.0)
MCV: 92.6 fL (ref 79.5–101.0)
MONO#: 0.5 10*3/uL (ref 0.1–0.9)
MONO%: 9.2 % (ref 0.0–14.0)
NEUT#: 3.3 10*3/uL (ref 1.5–6.5)
NEUT%: 64.1 % (ref 38.4–76.8)
PLATELETS: 504 10*3/uL — AB (ref 145–400)
RBC: 3.08 10*6/uL — ABNORMAL LOW (ref 3.70–5.45)
RDW: 18.6 % — AB (ref 11.2–14.5)
WBC: 5.1 10*3/uL (ref 3.9–10.3)

## 2015-12-15 LAB — COMPREHENSIVE METABOLIC PANEL
ALBUMIN: 2.2 g/dL — AB (ref 3.5–5.0)
ALK PHOS: 135 U/L (ref 40–150)
ALT: 28 U/L (ref 0–55)
ANION GAP: 8 meq/L (ref 3–11)
AST: 27 U/L (ref 5–34)
BUN: 11.3 mg/dL (ref 7.0–26.0)
CALCIUM: 9.5 mg/dL (ref 8.4–10.4)
CHLORIDE: 103 meq/L (ref 98–109)
CO2: 27 mEq/L (ref 22–29)
CREATININE: 0.7 mg/dL (ref 0.6–1.1)
EGFR: >90 mL/min/{1.73_m2} (ref >90)
Glucose: 91 mg/dl (ref 70–140)
POTASSIUM: 4.2 meq/L (ref 3.5–5.1)
Sodium: 138 mEq/L (ref 136–145)
Total Bilirubin: <0.3 mg/dL (ref 0.20–1.20)
Total Protein: 8.8 g/dL — ABNORMAL HIGH (ref 6.4–8.3)

## 2015-12-15 LAB — IRON AND TIBC
%SAT: 9 % — ABNORMAL LOW (ref 21–57)
IRON: 15 ug/dL — AB (ref 41–142)
TIBC: 155 ug/dL — AB (ref 236–444)
UIBC: 140 ug/dL (ref 120–384)

## 2015-12-15 NOTE — Telephone Encounter (Signed)
Pt called at Dr Edwyna Shell request. The 2 antinausea pills she takes are promethazine and zofran. She does not like the zofran b/c it makes her dizzy.

## 2015-12-15 NOTE — Progress Notes (Signed)
OFFICE PROGRESS NOTE   December 17, 2015   Physicians: Everitt Amber, Jacquelyne Balint, Nunzio Cobbs Hedgecock(PCP, [facility] [employer]), _ [name] [facility]); Louis Meckel ([facility]), [name] , Owens Loffler ([facility]); Praveen Mannam ([place] [facility])  INTERVAL HISTORY:  Patient is seen, together with aunt, now with malignant right pleural effusion, left pelvic nodal mass and left periaortic adenopathy, with previous IIIC high grade serous endometrial carcinoma. She has been on observation since completing 6 cycles of adjuvant carboplatin taxol on 10-31-14.  Patient saw Dr Sabra Heck in May and is to see her again on Aug 16  Patient was hospitalized at [facility] 6-15 thru 12-06-15 after presenting with progressive SOB, with finding of large left pleural effusion. CT chest 11-30-15 showed new enlarged and necrotic right hilar and mediastinal nodes, multiple right pulmonary masses, large left pleural effusion with pleural mass, tiny lucent foci too small to characterize in thoracic vertebrae. She had thoracentesis by pulmonary for 1.5 liters on 11-30-15, then left pleurex catheter placed by IR on 12-04-15 with additional 800 cc removed then. Cytology had metastatic adenocarcinoma, verbal from pathologist that this appeared serous.CT AP 12-01-15 showed no new or suspicious liver findings, 1.2 cm left periaortic node, large nodal mass left pelvic sidewall 6.2 x 2.9 cm, no ascites or peritoneal changes, no apparent bony findings. Plan at DC was for pulmonary to follow the pleurex, however that office not able to assist per communication from [name]. [facility] has supplied some drain kits and [facility] RN drained 350 cc on first attempt after DC, but almost none with 2 other attempts since then.   Patient denies increased SOB since pleurex has not drained well, does use inhaler a couple of times daily with good results. She denies left chest pain, cough, hemoptysis,  fever. She has no peripheral neuropathy now in hands or feet. Appetite has improved. Blood pressures have been better, I believe using florinef prn depending on home BP. Bowels are moving. She denies other pain. No bleeding. No pelvic discomfort. No problems with PAC.  Remainder of 10 point Review of Systems negative.   PAC in No genetics testing  ONCOLOGIC HISTORY Patient had one episode of post menopausal vaginal bleeding early 2015. Later in 2015 she had persistent vaginal discharge, for which she was seen by PCP Suzann Hedgecock in ~ 02-2014, had CTs in [facility] and was referred to Dr Adella Nissen, gyn in [facility]. Endometrial biopsy had concern (that path not included in information sent for consultation) such that she was referred to Dr Jacquelyne Balint. Surgery by Dr Sabra Heck at [facility] in [place] on 04-06-14 was TAH, BSO, omentectomy, pelvic and right common iliac node evaluation; patient was hospitalized x 3 days and tells me that she recovered well from the surgery. Pathology [id number]) from 04-06-14 found high grade serous carcinoma involving entire thickness of myometrium (depth of invasion 1.1 cm out of 1.1 cm), with invasion of cervical stroma, no involvement of uterine serosa/bilateral tubes and ovaries/omentum, positive LVSI, 2/5 right pelvic nodes, 2 of 4 left pelvic nodes and 0/1 right common iliac node. Bulk of the carcinoma was in lower uterine segment where it was within 0.1 cm of serosal surface. Cytology positive for adenocarcinoma on washings ([id number]). Surgical findings were significant for no paraaortic adenopathy apparent. Patient has received 3 cycles of adjuvant chemotherapy in [facility] by Dr Sabra Heck, on 05-05-14, 05-26-14 and 07-29-14. Per records and patient, delay of cycle 3 was related to  low counts and insurance changes. She received neulasta after chemotherapy on 07-29-14. Taxol was given at 175 mg/m2 for total dose was 280 mg cycle 1 and 291 mg for cycles 2 and 3;  carboplatin was AUC = 6 with total dose 610 mg cycle 1, 620 mg cycle 2 and 630 mg cycle 3.. She does not recall using oral decadron premed for taxol, with premeds listed on chemo flowsheets standard decadron 20 mg, zofran 16 mg, benadryl 50 mg (which caused severe restless legs) and pepcid 20 mg. Outside information does not include serial blood counts. Last note from Dr Sabra Heck dated 07-29-14 describes unremarkable exam, "NED during chemotherapy and adequate PS". Patient was see by Dr Denman George 08-09-14, who recommends restaging scans after 6 cycles of chemotherapy and consideration of vaginal brachytherapy; she has not been seen by radiation oncology in [place]. CT AP done in [facility] system 08-15-14 (due to elevated LFTs and blood in urine just after transfer of care) with soft tissue fullness at superior abdominal aorta and question of retroperitoneal necrotic adenopathy; plan repeat scans after complete present chemotherapy. Cycles 4 -6 carbo taxol given 08-24-14 thru 10-21-14, with gCSF support (last cycle carboplatin only due to peripheral neuropathy in feet). CT AP 12-25-14 without adenopathy or other apparent residual or recurrent malignancy.  Radiation treatment dates: July 19, July 27, July 29, August 9, August 11 Site/dose: Proximal vagina, 30 gray in 5 fractions Patient was hospitalized at [facility] 6-15 thru 12-06-15 after presenting with progressive SOB, with finding of large left pleural effusion. CT chest 11-30-15 showed new enlarged and necrotic right hilar and mediastinal nodes, multiple right pulmonary masses, large left pleural effusion with pleural mass, tiny lucent foci too small to characterize in thoracic vertebrae. She had thoracentesis by pulmonary for 1.5 liters on 11-30-15, then left pleurex catheter placed by IR on 12-04-15 with additional 800 cc removed then. Cytology [id number] adenocarcinoma (appeared serous per pathologist verbal report). CT AP 12-01-15 showed no new or suspicious liver  findings, 1.2 cm left periaortic node, large nodal mass left pelvic sidewall 6.2 x 2.9 cm, no ascites or peritoneal changes, no apparent bony findings.    Anemia: hemoglobin ranged from 8.1 - 12.6 thru chemo, with MCV 103 when I met her after first chemo and up to 109 during treatment. Iron studies 08-2014 had serum iron 43, %sat 22, ferritin 429, B12 >2000 SIEP 10-2014 had no monoclonal protein (elevated IgG and IgA polyclonal). Urinalyses had microscopic hematuria, with cystoscopy by Dr Burman Nieves at [facility] 10-20-2014 unremarkable. Haptoglobin 08-2014 not low (449), LDH normal, total bili occasionally elevated (2-29-26) but often normal. She was transfused 2 units PRBCs 09-2014 for hemoglobin 8.1.  Colonoscopy by Dr Oretha Caprice [facility] 05-23-15 unable to advance pediatric scope beyond sigmoid colon, which seemed to be fixed.    Objective:  Vital signs in last 24 hours:  BP 106/79 mmHg  Pulse 95  Temp(Src) 98.4 F (36.9 C) (Oral)  Resp 18  Wt 140 lb (63.504 kg)  SpO2 98% Weight down 6 lbs Alert, oriented and appropriate. Ambulatory without assistance.  No alopecia  HEENT:PERRL, sclerae not icteric. Oral mucosa moist without lesions, posterior pharynx clear.  Neck supple. No JVD.  Lymphatics:no cervical,supraclavicular, axillary or inguinal adenopathy Resp: diminished BS and dullness just in left base, otherwise clear to auscultation bilaterally and normal percussion bilaterally. Left pleurex catheter with clean intact dressing, no tenderness, no surrounding erythema. Cardio: regular rate and rhythm. No gallop. GI: soft, nontender, not distended, no  mass or organomegaly. Normally active bowel sounds.  Musculoskeletal/ Extremities: without pitting edema, cords, tenderness Neuro: no peripheral neuropathy. Otherwise nonfocal. PSYCH appropriate mood and affect Skin without rash, ecchymosis, petechiae Portacath-without erythema or tenderness  Lab Results: CBC today WBC 5.1, ANC  3.3, Hgb 9.1, plt 504k CMET today normal including na 138, creat 0.7, with exception of total protein 8.8 and albumin 2.2. Calcium 9.5 uncorrected.  Iron available after visit: serum iron 15 and %sat 9  Studies/Results:  CT CHEST WITH CONTRAST  11-30-15  COMPARISON: Chest CT dated 11/04/2014.  FINDINGS: Mediastinum/Lymph Nodes: New enlarged and centrally necrotic lymph nodes are seen in the right hilum, measuring 1.8 and 1.3 cm short axis dimension. Probable additional centrally necrotic lymph node is seen within the left lower perihilar region measuring 1.6 cm short axis dimension. Numerous additional lymph nodes are seen within the mediastinum, most prominently in the left paratracheal and aortopulmonary window regions, certainly pathologic by number.  Configuration of the thoracic aorta is stable, with associated mild atherosclerotic change. Heart size is normal although displaced to the right by the large left pleural effusion.  Lungs/Pleura: There were several small pulmonary nodules identified on chest CT of 11/04/2014, largest measuring 6 mm. There has been marked progression of disease since that previous chest CT.  There are multiple new and larger pulmonary nodules/masses within the right lung. This includes a 3.1 x 2.6 cm mass at the right lung base posteriorly (series 5, image 121), a 1.8 x 1.6 cm nodule within the right lower lobe posteriorly (series 5, image 103), a 1.3 x 1.3 cm nodule within the posterior-lateral portion of the right lower lobe (series 5, image 96), an irregular nodule within the superior segment of the right lower lobe measuring 1.2 x 0.9 cm (series 5, image 79) and a pleural based nodule adjacent to the right major fissure measuring 1.3 x 0.8 cm (series 5, image 67).  There is a new large left pleural effusion with adjacent compressive atelectasis. Pleural based mass within the left upper lobe medially measures 2.6 x 1.7 cm (series 5,  image 48). There is also irregular pleural thickening along the medial and anterior aspects of the left upper lobe, highly suggestive of neoplastic involvement.  Emphysematous changes noted bilaterally, upper lobe predominant.  Upper abdomen: Limited images of the upper abdomen are unremarkable.  Musculoskeletal: Scattered tiny lucent foci are seen within the thoracic vertebral bodies, too small to definitively characterize, possibly early developing lytic metastases. No definite evidence of osseous metastasis identified.  IMPRESSION: 1. Significant progression of disease, as detailed above. This includes numerous pulmonary masses/nodules within the right lung, largest of which is at the right lung base measuring 3.1 x 2.6 cm. A new pleural based mass is now seen along the medial aspects of the left upper lobe measuring 2.6 x 1.7 cm. On earlier chest CT of 11/04/2014, the largest pulmonary nodule identified within the left lung was 6 mm and the largest pulmonary nodule identified within the right lung was 4 mm. 2. Mediastinal and perihilar lymphadenopathy, most numerous within the left lower paratracheal and aortopulmonary window regions, with largest lymph nodes in the right perihilar region demonstrating evidence of central necrosis, almost certainly compatible with metastatic lymphadenopathy throughout and further evidence of progression of disease. 3. Large left pleural effusion, likely malignant effusion, with adjacent compressive atelectasis. Heart is displaced to the right by the large left pleural effusion. 4. Emphysematous change, upper lobe predominant. 5. No definite evidence of osseous metastasis. Scattered tiny  lucent foci within the thoracic vertebral bodies could conceivably represent early developing metastases.    CT ABDOMEN AND PELVIS WITH CONTRAST  12-01-15  COMPARISON: Chest CT 11/30/2015. Abdominal pelvic CT 12/25/2014  FINDINGS: Lower chest: As  seen on yesterday's chest CT, there is a large left pleural effusion with several pleural-based nodules in the left hemithorax consistent with metastatic disease. These measure up to 1.4 cm on image 10. There is no significant pleural fluid on the right. There are right lower lobe pulmonary nodules measuring up to 10 x 11 mm on image number 5 and 2.9 x 4.4 cm on image number 40. The left lower lobe remains collapsed. There is a probable 1.6 cm left lower lobe nodule on image 11. Right infrahilar adenopathy is partially imaged.  Hepatobiliary: There is stable low-density in the left hepatic lobe adjacent to the falciform ligament on image 40, likely focal fat. No new or suspicious hepatic findings. No evidence of gallstones, gallbladder wall thickening or biliary dilatation.  Pancreas: Unremarkable. No pancreatic ductal dilatation or surrounding inflammatory changes.  Spleen: Normal in size without focal abnormality.  Adrenals/Urinary Tract: Both adrenal glands appear normal. Grossly stable bilateral renal cysts. No evidence of renal mass, urinary tract calculus or hydronephrosis. The bladder appears unremarkable.  Stomach/Bowel: No evidence of bowel wall thickening, distention or surrounding inflammatory change. Moderate stool throughout the colon.  Vascular/Lymphatic: There is a new mildly enlarged left periaortic node measuring 12 mm on image 48. There is a large nodal mass along the left pelvic sidewall, measuring 6.2 x 2.9 cm on image 73. There is aortic and branch vessel atherosclerosis. No evidence of large vessel occlusion.  Reproductive: Hysterectomy. No evidence of adnexal mass.  Other: No ascites or peritoneal nodularity.  Musculoskeletal: No acute or significant osseous findings.  IMPRESSION: 1. As seen on yesterday's chest CT, there is metastatic disease to both lung bases with a large malignant left pleural effusion. There are several pleural-based  metastases on the left and right infrahilar lymphadenopathy, incompletely visualized. 2. Left periaortic and left pelvic side wall lymphadenopathy consistent with metastatic disease. No other evidence of abdominal pelvic metastatic disease. 3. No hydronephrosis.     CYTOLOGY  Lowella Dell Collected: 11/30/2015 Client: [facility] Accession: [id number] Received: 12/01/2015 Donita Brooks, NP CYTOPATHOLOGY REPORT Adequacy Reason Satisfactory For Evaluation. Diagnosis PLEURAL FLUID, LEFT (SPECIMEN 1 OF 1 COLLECTED 11/30/15): MALIGNANT CELLS CONSISTENT WITH METASTATIC ADENOCARCINOMA.   Prior to visit today I spoke with pathologist re pleural fluid studies. She confirmed that features of the metastatic adenocarcinoma were consistent with serous carcinoma, will still do additional stains now per request.   Lowella Dell Collected: 12/01/2015 Client: [facility] Accession: [id number] Received: 12/01/2015 Evlyn Clines, MD  Adequacy Reason Satisfactory For Evaluation. Diagnosis URINE CYTOLOGY ,VOIDED (SPECIMEN 1 OF 1 COLLECTED 12/01/15): NEGATIVE FOR HIGH GRADE UROTHELIAL CARCINOMA.thology          Medications: I have reviewed the patient's current medications. Patient to let us know what antiemetics she has available at home   DISCUSSION All of recent evaluation reviewed with patient and aunt now. I have explained that findings from pleural fluid cytology as well as pelvic and periaortic adenopathy are consistent with recurrent, metastatic serous endometrial carcinoma. I have told them that surgery to remove this involvement would not be beneficial, and that systemic treatment may improve or control the disease for some amount of time but will not be curative. As she is out over a year from last carboplatin and  taxol, and as peripheral neuropathy is not symptomatic now, it would be reasonable to return to those agents now. We have discussed possible  carboplatin allergic reactions after >6 cycles and mentioned carbo skin testing. I have talked with her about dose dense regimen, which generally is easier to tolerate particularly as taxol aches tend to be much less. Patient will let us know what antiemetics she has available at home. She did not use oral decadron with adjuvant chemo so will not use that now unless problems. I have told her that I will update Dr Sabra Heck with all of this information, and of course we are glad for her input also. Patient has given verbal consent for dose dense carbo taxol.  Other options not discussed specifically: single agent [place], [place] [employer] or single agent gemzar, doxil, avastin, oral etoposide etc.  Hopefully the malignant left pleural effusion may be amenable to sclerosis at some point. She is in agreement with referral to thoracic surgery with request for them to assist, as that did not work out with pulmonary. She is ok to try draining the pleurex ~ every 4-5 days, so hopefully enough equipment and HH support to allow this.   Iron studies pending at time of visit, patient in agreement with IV iron if needed.    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 Will confirm with path additional studies expected now early next week, but expect to begin dose dense carbo taxol also ~ late next week. Carbo skin testing, no premed decadron po for now.  2.pleurex drain in left chest by IR: refer to thoracic surgery to help manage, hopefully will allow some sclerosis to assist in longer term management without pleurex at some point. 3.PAC in 4.iron deficiency anemia: will be in touch with her to set up IV iron (prefer not on day of chemo 5.elevated total protein and low albumin: SPEP previously no M spike. 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  8.no advance directives per EMR: need to address further after confirmation of this pathology 9. Long past tobacco: DCd 09-2015. Emphysematous changes on CT. Prn inhaler. [name] still glad to follow for other pulmonary issues - thank you 10. Microscopic hematuria: known to Dr Louis Meckel. Urine cytology negative  11.unable to visualize beyond sigmoid on colonoscopy 12-16 due to stricture  12.history substance abuse including ETOH and marijuana 13. long history anxiety and depression 14.cytopenias with adjuvant chemo: required gCSF as neulasta  All questions answered. Chemo and granix orders placed, message to managed care. Will have RN follow up re low iron and 24 hr urine.  Feraheme orders placed under sign and hold for 7-7 and 12-29-15 tho dates can be moved if needed. This note + path and scans to Dr Hazle Nordmann, route PCP, cc [name].  Time spent 45 min including >50% counseling and coordination of care.   LIVESAY,LENNIS P, MD   12/17/2015, 3:02 PM

## 2015-12-17 DIAGNOSIS — D630 Anemia in neoplastic disease: Secondary | ICD-10-CM | POA: Insufficient documentation

## 2015-12-17 DIAGNOSIS — C541 Malignant neoplasm of endometrium: Secondary | ICD-10-CM | POA: Insufficient documentation

## 2015-12-18 ENCOUNTER — Telehealth: Payer: Self-pay

## 2015-12-18 MED ORDER — PROMETHAZINE HCL 12.5 MG PO TABS: 12.5000 mg | ORAL_TABLET | Freq: Four times a day (QID) | ORAL | Status: DC | PRN

## 2015-12-18 NOTE — Telephone Encounter (Signed)
-----   Message from Gordy Levan, MD sent at 12/17/2015  4:00 PM EDT ----- Please let her know iron studies show that her iron is still very low. I suggest we try 2 doses of IV iron as feraheme. I have put orders in under sign and hold for 7-7 and 7-14, can move dates if needed. Probably best not to do on day of chemo. I have not done POF.  Please also tell her that I would like to check protein in her urine with a 24 hour collection. Could give her the collection equipment next time at office. Order is in , I have not done POF  She is to let us know what antiemetics she has at home, if any, from last chemo, or can just refill what she used back thru ~ 10-2014. No decadron po for taxol  Thank you

## 2015-12-18 NOTE — Telephone Encounter (Signed)
Told Sheena Caldwell the results of the iron studies as noted below by Dr. Marko Plume.  She is open to IV  Iron on 7-7 and 12-29-15. She will pick up Jug for urine protein prior to IV Iron on 12-22-15. She used phenergan and zofran for nausea previously.  She prefers the phenergan as the Zofran makes her dizzy. She needs prescription sent to [facility]. Phenergan 12.5 mg po q 6 hrs prn  #30 sent to pharmacy.  POF sent to schedulers.

## 2015-12-20 ENCOUNTER — Encounter: Payer: Self-pay | Admitting: Cardiothoracic Surgery

## 2015-12-20 ENCOUNTER — Telehealth: Payer: Self-pay | Admitting: *Deleted

## 2015-12-20 ENCOUNTER — Telehealth: Payer: Self-pay | Admitting: Oncology

## 2015-12-20 ENCOUNTER — Institutional Professional Consult (permissible substitution) (INDEPENDENT_AMBULATORY_CARE_PROVIDER_SITE_OTHER): Payer: Medicare Other | Admitting: Cardiothoracic Surgery

## 2015-12-20 VITALS — BP 102/69 | HR 100 | Resp 20 | Ht 66.0 in | Wt 140.0 lb

## 2015-12-20 DIAGNOSIS — J90 Pleural effusion, not elsewhere classified: Secondary | ICD-10-CM

## 2015-12-20 DIAGNOSIS — D630 Anemia in neoplastic disease: Secondary | ICD-10-CM | POA: Diagnosis not present

## 2015-12-20 DIAGNOSIS — C482 Malignant neoplasm of peritoneum, unspecified: Secondary | ICD-10-CM

## 2015-12-20 DIAGNOSIS — J948 Other specified pleural conditions: Secondary | ICD-10-CM

## 2015-12-20 DIAGNOSIS — C541 Malignant neoplasm of endometrium: Secondary | ICD-10-CM

## 2015-12-20 DIAGNOSIS — R918 Other nonspecific abnormal finding of lung field: Secondary | ICD-10-CM

## 2015-12-20 NOTE — Telephone Encounter (Signed)
Per staff message and POF I have scheduled appts. Advised scheduler of appts. JMW  

## 2015-12-20 NOTE — Telephone Encounter (Signed)
per staff message to move lab prior to 7/7 appt-cld & adv pt to be here @8 :45 for lab

## 2015-12-20 NOTE — Progress Notes (Signed)
PCP is HEDGECOCK,SUZANNE, PA-C Referring Provider is Ardith Dark, [place]  Chief Complaint  Patient presents with  . Pleural Effusion    Pleurx catheter placed 12/04/15 @ [facility], Chest CT  11/30/15  . Lung Lesion  Patient examined, last chest x-ray and CT scan of chest personally reviewed and counseled with patient  HPI: 65 year old AA female nonsmoker referred for evaluation and treatment of a malignant left pleural effusion. Patient has history of endometrial carcinoma last treated approximately 2 years ago. Last month she presented with several weeks of cough weakness and shortness of breath not responsive to antidepressives. She was admitted to [facility] via the emergency department after chest x-ray showed a large left pleural effusion filling up two thirds of the left pleural space. Thoracentesis removed 2 L. A subsequent Pleurx catheter was placed by interventional radiology which removed 800 cc. Chest x-ray still showed significant subpulmonic fluid which was undrained. 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  Home health nursing has not been able to remove significant fluid from the Pleurx catheter. The patient has been evaluated by oncology-Dr. Marko Plume and is being prepared for chemotherapy. Her hemoglobin was low at 8.5 and she was scheduled for IV iron therapy later this week.   Past Medical History  Diagnosis Date  . Radiation 01/03/15, 01/11/15, 01/13/15, 01/24/15, 01/26/15    proximal vagina 30 gray  . Arthritis   . Blood transfusion without reported diagnosis     2015, 2016  . COPD (chronic obstructive pulmonary disease) ([place])   . Uterine cancer [facility])     finished chemo May 2016, to start radiation July 2016    Past Surgical History  Procedure Laterality Date  . Cesarean section  1991  . Incise and drain abcess  1970    scalp  . Portacath placement  04/26/2014    rt. power port with tip  in SVC  . Total abdominal hysterectomy w/ bilateral salpingoophorectomy  04/06/14    TAH, BSO, omentectomy, pelvic and right common iliac node evaluation    Family History  Problem Relation Age of Onset  . Hypertension Mother   . Cancer Father   . Colon cancer Neg Hx   . Esophageal cancer Neg Hx   . Rectal cancer Neg Hx   . Stomach cancer Neg Hx     Social History Social History  Substance Use Topics  . Smoking status: Former Smoker -- 1.00 packs/day for 30 years    Types: Cigarettes    Quit date: 07/16/2013  . Smokeless tobacco: Never Used  . Alcohol Use: 1.2 - 1.8 oz/week    2-3 Glasses of wine per week    Current Outpatient Prescriptions  Medication Sig Dispense Refill  . albuterol (PROVENTIL HFA;VENTOLIN HFA) 108 (90 Base) MCG/ACT inhaler Inhale 1 puff into the lungs every 6 (six) hours as needed for wheezing or shortness of breath.    . Calcium Citrate-Vitamin D (CALCIUM + D PO) Take 1 tablet by mouth daily.    . cholecalciferol (VITAMIN D) 1000 units tablet Take 1,000 Units by mouth daily.    Marland Kitchen docusate sodium (COLACE) 100 MG capsule Take 1 capsule (100 mg total) by mouth every 12 (twelve) hours. 30 capsule 0  . ferrous sulfate 325 (65 FE) MG tablet Take 325 mg by mouth daily.    . fludrocortisone (FLORINEF) 0.1 MG tablet Take 0.05 mg by mouth daily.    . Multiple Vitamin (MULTI-VITAMINS) TABS Take 1  tablet by mouth daily.    . polyethylene glycol (MIRALAX / GLYCOLAX) packet Take 17 g by mouth daily as needed.    . promethazine (PHENERGAN) 12.5 MG tablet Take 1 tablet (12.5 mg total) by mouth every 6 (six) hours as needed for nausea or vomiting. 30 tablet 0  . UNABLE TO FIND Med Name: smooth move drink for constipation     No current facility-administered medications for this visit.    Allergies  Allergen Reactions  . Levaquin [Levofloxacin In D5w] Other (See Comments)    "I just felt horrible"    Review of Systems   The patient still has a Port-A-Cath in  place Left Pleurx catheter based by interventional radiology June 2017 She complains of shortness of breath with exertion and orthopnea. Patient is retired disabled from working at [employer]. [employer] and working at [employer] [place] :  [ y ] = yes, [  ] = no        General :  Weight gain [   ]    Weight loss  [ yes  ]  yes Fatigue [  ]  Fever [  ]  Chills  [  ]                                Weakness  [  ]           HEENT    Headache [  ]  Dizziness [  ]  Blurred vision [  ] Glaucoma  [  ]                          Nosebleeds [  ] Painful or loose teeth [ poor dental hygiene ]        Cardiac :  Chest pain/ pressure [  ]  Resting SOB [  ] exertional SOB [ yes ]                        Orthopnea [  ]  Pedal edema  [  ]  Palpitations [  ] Syncope/presyncope [ ]                         Paroxysmal nocturnal dyspnea [  ]         Pulmonary : cough [ yes ]  wheezing [  ]  Hemoptysis [  ] Sputum [  ] Snoring [  ]                              Pneumothorax [  ]  Sleep apnea [  ]        GI : Vomiting [  ]  Dysphagia [  ]  Melena  [  ]  Abdominal pain [  ] BRBPR [  ]              Heart burn [  ]  Constipation [  ] Diarrhea  [  ] Colonoscopy [   ]        GU : Hematuria [  ]  Dysuria [  ]  Nocturia [  ] UTI's [  ]        Vascular : Claudication [  ]  Rest  pain [  ]  DVT [  ] Vein stripping [  ] leg ulcers [  ]                          TIA [  ] Stroke [  ]  Varicose veins [  ]        NEURO :  Headaches  [ yes ] Seizures [  ] Vision changes [  ] Paresthesias [  ]                                       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        Musculoskeletal :  Arthritis [  ] Gout  [  ]  Back pain [  ]  Joint pain [  ]        Skin :  Rash [  ]  Melanoma [  ] Sores [  ]        Heme : Bleeding problems [  ]Clotting Disorders [  ] Anemia [ yes ]Blood Transfusion [place] ]        Endocrine : Diabetes [  ] Heat or Cold intolerance [  ] Polyuria [  ]excessive thirst [ ]         Psych :  Depression [  ]  Anxiety [  ]  Psych hospitalizations [  ] Memory change [  ]                                               BP 102/69 mmHg  Pulse 100  Resp 20  Ht 5\' 6"  (1.676 m)  Wt 140 lb (63.504 kg)  BMI 22.61 kg/m2  SpO2 93% Physical Exam       Physical Exam  General: Thin chronically ill-appearing middle-aged AA female HEENT: Normocephalic pupils equal , dentition poor Neck: Supple without JVD, adenopathy, or bruit Chest: Reduced breath sounds at left lung base, no chest wall tenderness             or deformity Cardiovascular: Regular rate and rhythm, no murmur, no gallop, peripheral pulses             palpable in all extremities Abdomen:  Soft, nontender, no palpable mass or organomegaly Extremities: Warm, well-perfused, no clubbing cyanosis edema or tenderness,              no venous stasis changes of the legs Rectal/GU: Deferred Neuro: Grossly non--focal and symmetrical throughout Skin: Clean and dry without rash or ulceration   Diagnostic Tests: Chest x-ray and CT scan shows loculated pleural effusion despite previous thoracentesis and Pleurx catheter placement. There appears to be entrapment of the left lower lobe.  Cytology of the pleural fluid demonstrates metastatic adenocarcinoma from a GYN primary  Impression: Malignant pleural effusion Persistent effusions status post placement of Pleurx catheter from loculation. Persistent entrapment of left lower lobe from loculated pleural effusion  Plan: I discussed with patient and her family that the only way to remove the fluid would be with VATS surgery to breakup the loculations, draining effusion, apply chemical pleurodesis and probably insert a better positioned Pleurx catheter at the time of surgery. The patient understands that this will be under general anesthesia  with a chest incision which will be painful after surgery and that she will have a chest tube for at least 48 hours. She understands the risks  benefits of surgery and agrees to proceed. Surgery scheduled for Friday, July 7 at [facility] hospital.  Len Childs, MD [facility] [phone]

## 2015-12-20 NOTE — Telephone Encounter (Signed)
spoke w/ pt confirmed 7/7 apt , pt will get copy of updated sched when she comes in

## 2015-12-21 ENCOUNTER — Encounter (HOSPITAL_COMMUNITY): Payer: Self-pay | Admitting: *Deleted

## 2015-12-21 ENCOUNTER — Other Ambulatory Visit: Payer: Self-pay | Admitting: *Deleted

## 2015-12-21 DIAGNOSIS — J90 Pleural effusion, not elsewhere classified: Secondary | ICD-10-CM

## 2015-12-21 MED ORDER — DEXTROSE 5 % IV SOLN
1.5000 g | INTRAVENOUS | Status: AC
  Administered 2015-12-22: 1.5 g via INTRAVENOUS
  Filled 2015-12-21: qty 1.5

## 2015-12-21 NOTE — Progress Notes (Signed)
Pt denies cardiac history or chest pain. Having sob due to pleural effusion.

## 2015-12-22 ENCOUNTER — Encounter (HOSPITAL_COMMUNITY)
Admission: RE | Disposition: A | Discharge status: Home Health | Payer: Self-pay | Source: Ambulatory Visit | Attending: Cardiothoracic Surgery

## 2015-12-22 ENCOUNTER — Encounter (HOSPITAL_COMMUNITY): Payer: Self-pay | Admitting: *Deleted

## 2015-12-22 ENCOUNTER — Inpatient Hospital Stay (HOSPITAL_COMMUNITY)
Admission: RE | Admit: 2015-12-22 | Discharge: 2015-12-26 | DRG: 167 | Disposition: A | Discharge status: Home Health | Payer: Medicare Other | Source: Ambulatory Visit | Attending: Cardiothoracic Surgery | Admitting: Cardiothoracic Surgery

## 2015-12-22 ENCOUNTER — Inpatient Hospital Stay (HOSPITAL_COMMUNITY): Payer: Medicare Other

## 2015-12-22 ENCOUNTER — Ambulatory Visit: Payer: Medicaid Other

## 2015-12-22 ENCOUNTER — Inpatient Hospital Stay (HOSPITAL_COMMUNITY): Payer: Medicare Other | Admitting: Certified Registered Nurse Anesthetist

## 2015-12-22 ENCOUNTER — Other Ambulatory Visit: Payer: Medicaid Other

## 2015-12-22 DIAGNOSIS — Z90722 Acquired absence of ovaries, bilateral: Secondary | ICD-10-CM

## 2015-12-22 DIAGNOSIS — C782 Secondary malignant neoplasm of pleura: Secondary | ICD-10-CM | POA: Diagnosis present

## 2015-12-22 DIAGNOSIS — Z87891 Personal history of nicotine dependence: Secondary | ICD-10-CM | POA: Diagnosis not present

## 2015-12-22 DIAGNOSIS — D6489 Other specified anemias: Secondary | ICD-10-CM | POA: Diagnosis present

## 2015-12-22 DIAGNOSIS — Z923 Personal history of irradiation: Secondary | ICD-10-CM

## 2015-12-22 DIAGNOSIS — C7802 Secondary malignant neoplasm of left lung: Secondary | ICD-10-CM | POA: Diagnosis present

## 2015-12-22 DIAGNOSIS — Z881 Allergy status to other antibiotic agents status: Secondary | ICD-10-CM

## 2015-12-22 DIAGNOSIS — J449 Chronic obstructive pulmonary disease, unspecified: Secondary | ICD-10-CM | POA: Diagnosis present

## 2015-12-22 DIAGNOSIS — G8918 Other acute postprocedural pain: Secondary | ICD-10-CM | POA: Diagnosis not present

## 2015-12-22 DIAGNOSIS — C541 Malignant neoplasm of endometrium: Secondary | ICD-10-CM | POA: Diagnosis present

## 2015-12-22 DIAGNOSIS — J910 Malignant pleural effusion: Secondary | ICD-10-CM | POA: Diagnosis present

## 2015-12-22 DIAGNOSIS — Z90710 Acquired absence of both cervix and uterus: Secondary | ICD-10-CM | POA: Diagnosis not present

## 2015-12-22 DIAGNOSIS — J939 Pneumothorax, unspecified: Secondary | ICD-10-CM

## 2015-12-22 DIAGNOSIS — Z9221 Personal history of antineoplastic chemotherapy: Secondary | ICD-10-CM | POA: Diagnosis not present

## 2015-12-22 DIAGNOSIS — Z9689 Presence of other specified functional implants: Secondary | ICD-10-CM

## 2015-12-22 DIAGNOSIS — J90 Pleural effusion, not elsewhere classified: Secondary | ICD-10-CM

## 2015-12-22 HISTORY — PX: VIDEO ASSISTED THORACOSCOPY: SHX5073

## 2015-12-22 HISTORY — DX: Constipation, unspecified: K59.00

## 2015-12-22 HISTORY — PX: PLEURAL EFFUSION DRAINAGE: SHX5099

## 2015-12-22 HISTORY — PX: PLEURADESIS: SHX6030

## 2015-12-22 HISTORY — DX: Anemia, unspecified: D64.9

## 2015-12-22 HISTORY — PX: CHEST TUBE INSERTION: SHX231

## 2015-12-22 LAB — CBC
HCT: 29.4 % — ABNORMAL LOW (ref 36.0–46.0)
Hemoglobin: 9.3 g/dL — ABNORMAL LOW (ref 12.0–15.0)
MCH: 29.4 pg (ref 26.0–34.0)
MCHC: 31.6 g/dL (ref 30.0–36.0)
MCV: 93 fL (ref 78.0–100.0)
Platelets: 403 10*3/uL — ABNORMAL HIGH (ref 150–400)
RBC: 3.16 MIL/uL — ABNORMAL LOW (ref 3.87–5.11)
RDW: 17.6 % — ABNORMAL HIGH (ref 11.5–15.5)
WBC: 5.4 10*3/uL (ref 4.0–10.5)

## 2015-12-22 LAB — BLOOD GAS, ARTERIAL
Acid-Base Excess: 2.7 mmol/L — ABNORMAL HIGH (ref 0.0–2.0)
Bicarbonate: 26.2 mEq/L — ABNORMAL HIGH (ref 20.0–24.0)
Drawn by: 449841
O2 Saturation: 89.4 %
Patient temperature: 98.6
TCO2: 27.3 mmol/L (ref 0–100)
pCO2 arterial: 36.6 mmHg (ref 35.0–45.0)
pH, Arterial: 7.468 — ABNORMAL HIGH (ref 7.350–7.450)
pO2, Arterial: 60.5 mmHg — ABNORMAL LOW (ref 80.0–100.0)

## 2015-12-22 LAB — URINALYSIS, ROUTINE W REFLEX MICROSCOPIC
Bilirubin Urine: NEGATIVE
Glucose, UA: NEGATIVE mg/dL
Ketones, ur: NEGATIVE mg/dL
Leukocytes, UA: NEGATIVE
Nitrite: NEGATIVE
Protein, ur: 100 mg/dL — AB
Specific Gravity, Urine: 1.021 (ref 1.005–1.030)
pH: 8 (ref 5.0–8.0)

## 2015-12-22 LAB — URINE MICROSCOPIC-ADD ON: Bacteria, UA: NONE SEEN

## 2015-12-22 LAB — PROTIME-INR
INR: 1.27 (ref 0.00–1.49)
Prothrombin Time: 16 seconds — ABNORMAL HIGH (ref 11.6–15.2)

## 2015-12-22 LAB — TYPE AND SCREEN
ABO/RH(D): O POS
Antibody Screen: NEGATIVE

## 2015-12-22 LAB — COMPREHENSIVE METABOLIC PANEL
ALT: 29 U/L (ref 14–54)
AST: 25 U/L (ref 15–41)
Albumin: 2.2 g/dL — ABNORMAL LOW (ref 3.5–5.0)
Alkaline Phosphatase: 121 U/L (ref 38–126)
Anion gap: 9 (ref 5–15)
BUN: 9 mg/dL (ref 6–20)
CO2: 22 mmol/L (ref 22–32)
Calcium: 8.8 mg/dL — ABNORMAL LOW (ref 8.9–10.3)
Chloride: 106 mmol/L (ref 101–111)
Creatinine, Ser: 0.62 mg/dL (ref 0.44–1.00)
GFR calc Af Amer: >60 mL/min (ref >60)
GFR calc non Af Amer: >60 mL/min (ref >60)
Glucose, Bld: 94 mg/dL (ref 65–99)
Potassium: 3.8 mmol/L (ref 3.5–5.1)
Sodium: 137 mmol/L (ref 135–145)
Total Bilirubin: 0.2 mg/dL — ABNORMAL LOW (ref 0.3–1.2)
Total Protein: 8.6 g/dL — ABNORMAL HIGH (ref 6.5–8.1)

## 2015-12-22 LAB — APTT: aPTT: 41 seconds — ABNORMAL HIGH (ref 24–37)

## 2015-12-22 LAB — SURGICAL PCR SCREEN
MRSA, PCR: NEGATIVE
Staphylococcus aureus: NEGATIVE

## 2015-12-22 LAB — ABO/RH: ABO/RH(D): O POS

## 2015-12-22 SURGERY — VIDEO ASSISTED THORACOSCOPY
Anesthesia: General | Site: Chest | Laterality: Left

## 2015-12-22 MED ORDER — ALBUTEROL SULFATE (2.5 MG/3ML) 0.083% IN NEBU
2.5000 mg | INHALATION_SOLUTION | Freq: Three times a day (TID) | RESPIRATORY_TRACT | Status: DC
  Administered 2015-12-23 – 2015-12-25 (×6): 2.5 mg via RESPIRATORY_TRACT
  Filled 2015-12-22 (×6): qty 3

## 2015-12-22 MED ORDER — PROPOFOL 10 MG/ML IV BOLUS
INTRAVENOUS | Status: AC
  Filled 2015-12-22: qty 20

## 2015-12-22 MED ORDER — MULTI-VITAMINS PO TABS: 1.0000 | ORAL_TABLET | Freq: Every day | ORAL | Status: DC

## 2015-12-22 MED ORDER — METOCLOPRAMIDE HCL 5 MG/ML IJ SOLN
10.0000 mg | Freq: Four times a day (QID) | INTRAMUSCULAR | Status: DC
  Administered 2015-12-22 – 2015-12-26 (×12): 10 mg via INTRAVENOUS
  Filled 2015-12-22 (×14): qty 2

## 2015-12-22 MED ORDER — TRAMADOL HCL 50 MG PO TABS
50.0000 mg | ORAL_TABLET | Freq: Four times a day (QID) | ORAL | Status: DC | PRN
  Administered 2015-12-26: 100 mg via ORAL
  Filled 2015-12-22: qty 2

## 2015-12-22 MED ORDER — PHENYLEPHRINE 40 MCG/ML (10ML) SYRINGE FOR IV PUSH (FOR BLOOD PRESSURE SUPPORT)
PREFILLED_SYRINGE | INTRAVENOUS | Status: DC | PRN
  Administered 2015-12-22: 40 ug via INTRAVENOUS
  Administered 2015-12-22: 80 ug via INTRAVENOUS
  Administered 2015-12-22: 40 ug via INTRAVENOUS

## 2015-12-22 MED ORDER — DIPHENHYDRAMINE HCL 50 MG/ML IJ SOLN
12.5000 mg | Freq: Four times a day (QID) | INTRAMUSCULAR | Status: DC | PRN
Start: 2015-12-22 — End: 2015-12-24
  Filled 2015-12-22: qty 0.25

## 2015-12-22 MED ORDER — SUGAMMADEX SODIUM 200 MG/2ML IV SOLN
INTRAVENOUS | Status: DC | PRN
  Administered 2015-12-22: 127 mg via INTRAVENOUS

## 2015-12-22 MED ORDER — LACTATED RINGERS IV SOLN
INTRAVENOUS | Status: DC
  Administered 2015-12-22 – 2015-12-23 (×3): via INTRAVENOUS

## 2015-12-22 MED ORDER — DEXAMETHASONE SODIUM PHOSPHATE 10 MG/ML IJ SOLN
INTRAMUSCULAR | Status: AC
  Filled 2015-12-22: qty 2

## 2015-12-22 MED ORDER — SODIUM CHLORIDE 0.9 % IV SOLN
500.0000 mg | Freq: Once | INTRAVENOUS | Status: AC
  Administered 2015-12-22: 500 mg via INTRAPLEURAL
  Filled 2015-12-22: qty 500

## 2015-12-22 MED ORDER — MUPIROCIN 2 % EX OINT
TOPICAL_OINTMENT | CUTANEOUS | Status: AC
  Filled 2015-12-22: qty 22

## 2015-12-22 MED ORDER — 0.9 % SODIUM CHLORIDE (POUR BTL) OPTIME
TOPICAL | Status: DC | PRN
  Administered 2015-12-22: 2000 mL

## 2015-12-22 MED ORDER — POLYETHYLENE GLYCOL 3350 17 G PO PACK: 17.0000 g | PACK | Freq: Every day | ORAL | Status: DC | PRN

## 2015-12-22 MED ORDER — BISACODYL 5 MG PO TBEC
10.0000 mg | DELAYED_RELEASE_TABLET | Freq: Every day | ORAL | Status: DC
  Administered 2015-12-23 – 2015-12-25 (×3): 10 mg via ORAL
  Filled 2015-12-22 (×4): qty 2

## 2015-12-22 MED ORDER — FENTANYL 40 MCG/ML IV SOLN
INTRAVENOUS | Status: AC
  Filled 2015-12-22: qty 25

## 2015-12-22 MED ORDER — IRON DEXTRAN 50 MG/ML IJ SOLN
250.0000 mg | Freq: Once | INTRAMUSCULAR | Status: AC
  Administered 2015-12-23: 250 mg via INTRAVENOUS
  Filled 2015-12-22: qty 5

## 2015-12-22 MED ORDER — CALCIUM CITRATE-VITAMIN D 315-200 MG-UNIT PO TABS: 1.0000 | ORAL_TABLET | Freq: Every day | ORAL | Status: DC

## 2015-12-22 MED ORDER — HYDROMORPHONE HCL 1 MG/ML IJ SOLN
INTRAMUSCULAR | Status: AC
  Filled 2015-12-22: qty 1

## 2015-12-22 MED ORDER — ONDANSETRON HCL 4 MG/2ML IJ SOLN: 4.0000 mg | Freq: Four times a day (QID) | INTRAMUSCULAR | Status: DC | PRN

## 2015-12-22 MED ORDER — FENTANYL CITRATE (PF) 250 MCG/5ML IJ SOLN
INTRAMUSCULAR | Status: AC
  Filled 2015-12-22: qty 5

## 2015-12-22 MED ORDER — LIDOCAINE 2% (20 MG/ML) 5 ML SYRINGE
INTRAMUSCULAR | Status: DC | PRN
  Administered 2015-12-22: 100 mg via INTRAVENOUS

## 2015-12-22 MED ORDER — PROPOFOL 10 MG/ML IV BOLUS
INTRAVENOUS | Status: DC | PRN
  Administered 2015-12-22: 120 mg via INTRAVENOUS

## 2015-12-22 MED ORDER — SENNOSIDES-DOCUSATE SODIUM 8.6-50 MG PO TABS
1.0000 | ORAL_TABLET | Freq: Every day | ORAL | Status: DC
  Administered 2015-12-22 – 2015-12-25 (×4): 1 via ORAL
  Filled 2015-12-22 (×4): qty 1

## 2015-12-22 MED ORDER — DIPHENHYDRAMINE HCL 12.5 MG/5ML PO ELIX
12.5000 mg | ORAL_SOLUTION | Freq: Four times a day (QID) | ORAL | Status: DC | PRN
  Filled 2015-12-22: qty 5

## 2015-12-22 MED ORDER — FENTANYL 40 MCG/ML IV SOLN
INTRAVENOUS | Status: DC
  Administered 2015-12-22: 15 ug via INTRAVENOUS
  Administered 2015-12-22: 14:00:00 via INTRAVENOUS
  Administered 2015-12-23: 30 ug via INTRAVENOUS
  Administered 2015-12-23: 15 ug via INTRAVENOUS
  Administered 2015-12-23: 23.05 ug via INTRAVENOUS
  Administered 2015-12-23: 15 ug via INTRAVENOUS
  Administered 2015-12-23: 0 ug via INTRAVENOUS
  Administered 2015-12-23 – 2015-12-24 (×3): 15 ug via INTRAVENOUS

## 2015-12-22 MED ORDER — HYDROMORPHONE HCL 1 MG/ML IJ SOLN
INTRAMUSCULAR | Status: AC
  Administered 2015-12-22: 0.5 mg via INTRAVENOUS
  Filled 2015-12-22: qty 1

## 2015-12-22 MED ORDER — ALBUTEROL SULFATE (5 MG/ML) 0.5% IN NEBU: 2.5000 mg | INHALATION_SOLUTION | Freq: Four times a day (QID) | RESPIRATORY_TRACT | Status: DC

## 2015-12-22 MED ORDER — ONDANSETRON HCL 4 MG/2ML IJ SOLN
INTRAMUSCULAR | Status: DC | PRN
  Administered 2015-12-22: 4 mg via INTRAVENOUS

## 2015-12-22 MED ORDER — MEPERIDINE HCL 25 MG/ML IJ SOLN: 6.2500 mg | INTRAMUSCULAR | Status: DC | PRN

## 2015-12-22 MED ORDER — ACETAMINOPHEN 500 MG PO TABS
1000.0000 mg | ORAL_TABLET | Freq: Four times a day (QID) | ORAL | Status: DC
  Administered 2015-12-22 – 2015-12-26 (×10): 1000 mg via ORAL
  Filled 2015-12-22 (×14): qty 2

## 2015-12-22 MED ORDER — MIDAZOLAM HCL 10 MG/2ML IJ SOLN
INTRAMUSCULAR | Status: AC
  Filled 2015-12-22: qty 2

## 2015-12-22 MED ORDER — ROCURONIUM BROMIDE 50 MG/5ML IV SOLN
INTRAVENOUS | Status: AC
  Filled 2015-12-22: qty 1

## 2015-12-22 MED ORDER — HYDROMORPHONE HCL 1 MG/ML IJ SOLN
INTRAMUSCULAR | Status: DC | PRN
  Administered 2015-12-22: 1 mg via INTRAVENOUS
  Administered 2015-12-22: 0.5 mg via INTRAVENOUS

## 2015-12-22 MED ORDER — HYDROMORPHONE HCL 1 MG/ML IJ SOLN
0.2500 mg | INTRAMUSCULAR | Status: DC | PRN
  Administered 2015-12-22: 0.5 mg via INTRAVENOUS

## 2015-12-22 MED ORDER — ROCURONIUM BROMIDE 100 MG/10ML IV SOLN
INTRAVENOUS | Status: DC | PRN
  Administered 2015-12-22: 40 mg via INTRAVENOUS
  Administered 2015-12-22: 10 mg via INTRAVENOUS

## 2015-12-22 MED ORDER — LIDOCAINE 2% (20 MG/ML) 5 ML SYRINGE
INTRAMUSCULAR | Status: AC
  Filled 2015-12-22: qty 5

## 2015-12-22 MED ORDER — FLUDROCORTISONE ACETATE 0.1 MG PO TABS
0.0500 mg | ORAL_TABLET | Freq: Every day | ORAL | Status: DC
  Administered 2015-12-23 – 2015-12-26 (×4): 0.05 mg via ORAL
  Filled 2015-12-22 (×4): qty 0.5

## 2015-12-22 MED ORDER — FENTANYL CITRATE (PF) 100 MCG/2ML IJ SOLN
INTRAMUSCULAR | Status: DC | PRN
  Administered 2015-12-22: 100 ug via INTRAVENOUS
  Administered 2015-12-22: 50 ug via INTRAVENOUS
  Administered 2015-12-22: 100 ug via INTRAVENOUS

## 2015-12-22 MED ORDER — ADULT MULTIVITAMIN W/MINERALS CH
1.0000 | ORAL_TABLET | Freq: Every day | ORAL | Status: DC
  Administered 2015-12-23 – 2015-12-26 (×4): 1 via ORAL
  Filled 2015-12-22 (×4): qty 1

## 2015-12-22 MED ORDER — SUGAMMADEX SODIUM 200 MG/2ML IV SOLN
INTRAVENOUS | Status: AC
  Filled 2015-12-22: qty 2

## 2015-12-22 MED ORDER — ALBUTEROL SULFATE (2.5 MG/3ML) 0.083% IN NEBU
2.5000 mg | INHALATION_SOLUTION | Freq: Four times a day (QID) | RESPIRATORY_TRACT | Status: DC
  Administered 2015-12-22: 2.5 mg via RESPIRATORY_TRACT
  Filled 2015-12-22: qty 3

## 2015-12-22 MED ORDER — DEXAMETHASONE SODIUM PHOSPHATE 10 MG/ML IJ SOLN
INTRAMUSCULAR | Status: DC | PRN
  Administered 2015-12-22: 4 mg via INTRAVENOUS

## 2015-12-22 MED ORDER — NALOXONE HCL 0.4 MG/ML IJ SOLN
0.4000 mg | INTRAMUSCULAR | Status: DC | PRN
  Filled 2015-12-22: qty 1

## 2015-12-22 MED ORDER — CALCIUM CARBONATE-VITAMIN D 500-200 MG-UNIT PO TABS
1.0000 | ORAL_TABLET | Freq: Every day | ORAL | Status: DC
  Administered 2015-12-23 – 2015-12-26 (×4): 1 via ORAL
  Filled 2015-12-22 (×4): qty 1

## 2015-12-22 MED ORDER — DEXTROSE 5 % IV SOLN
1.5000 g | Freq: Two times a day (BID) | INTRAVENOUS | Status: AC
  Administered 2015-12-22 – 2015-12-24 (×4): 1.5 g via INTRAVENOUS
  Filled 2015-12-22 (×4): qty 1.5

## 2015-12-22 MED ORDER — ALBUTEROL SULFATE HFA 108 (90 BASE) MCG/ACT IN AERS: 1.0000 | INHALATION_SPRAY | Freq: Four times a day (QID) | RESPIRATORY_TRACT | Status: DC | PRN

## 2015-12-22 MED ORDER — MUPIROCIN 2 % EX OINT
1.0000 "application " | TOPICAL_OINTMENT | Freq: Once | CUTANEOUS | Status: AC
  Administered 2015-12-22: 09:00:00 via TOPICAL

## 2015-12-22 MED ORDER — ALBUTEROL SULFATE (2.5 MG/3ML) 0.083% IN NEBU: 2.5000 mg | INHALATION_SOLUTION | Freq: Four times a day (QID) | RESPIRATORY_TRACT | Status: DC

## 2015-12-22 MED ORDER — ACETAMINOPHEN 160 MG/5ML PO SOLN: 1000.0000 mg | Freq: Four times a day (QID) | ORAL | Status: DC

## 2015-12-22 MED ORDER — SODIUM CHLORIDE 0.9% FLUSH: 9.0000 mL | INTRAVENOUS | Status: DC | PRN

## 2015-12-22 MED ORDER — DEXTROSE 5 % IV SOLN
1.5000 g | Freq: Two times a day (BID) | INTRAVENOUS | Status: DC
  Filled 2015-12-22 (×2): qty 1.5

## 2015-12-22 MED ORDER — ONDANSETRON HCL 4 MG/2ML IJ SOLN
4.0000 mg | Freq: Four times a day (QID) | INTRAMUSCULAR | Status: DC | PRN
  Filled 2015-12-22: qty 2

## 2015-12-22 MED ORDER — LACTATED RINGERS IV SOLN
INTRAVENOUS | Status: DC | PRN
  Administered 2015-12-22 (×2): via INTRAVENOUS

## 2015-12-22 MED ORDER — ONDANSETRON HCL 4 MG/2ML IJ SOLN
INTRAMUSCULAR | Status: AC
  Filled 2015-12-22: qty 2

## 2015-12-22 MED ORDER — VITAMIN D 1000 UNITS PO TABS
1000.0000 [IU] | ORAL_TABLET | Freq: Every day | ORAL | Status: DC
  Administered 2015-12-23 – 2015-12-26 (×4): 1000 [IU] via ORAL
  Filled 2015-12-22 (×4): qty 1

## 2015-12-22 MED ORDER — POTASSIUM CHLORIDE 10 MEQ/50ML IV SOLN: 10.0000 meq | Freq: Every day | INTRAVENOUS | Status: DC | PRN

## 2015-12-22 MED ORDER — SODIUM CHLORIDE 0.9 % IV SOLN
25.0000 mg | Freq: Once | INTRAVENOUS | Status: AC
  Administered 2015-12-23: 25 mg via INTRAVENOUS
  Filled 2015-12-22 (×2): qty 0.5

## 2015-12-22 MED ORDER — FERROUS SULFATE 325 (65 FE) MG PO TABS
325.0000 mg | ORAL_TABLET | Freq: Every day | ORAL | Status: DC
  Administered 2015-12-23 – 2015-12-26 (×3): 325 mg via ORAL
  Filled 2015-12-22 (×4): qty 1

## 2015-12-22 MED ORDER — PROMETHAZINE HCL 25 MG/ML IJ SOLN: 6.2500 mg | INTRAMUSCULAR | Status: DC | PRN

## 2015-12-22 MED ORDER — OXYCODONE HCL 5 MG PO TABS
5.0000 mg | ORAL_TABLET | ORAL | Status: DC | PRN
  Administered 2015-12-24 – 2015-12-25 (×4): 5 mg via ORAL
  Filled 2015-12-22 (×4): qty 1

## 2015-12-22 MED ORDER — MIDAZOLAM HCL 5 MG/5ML IJ SOLN
INTRAMUSCULAR | Status: DC | PRN
  Administered 2015-12-22 (×2): 1 mg via INTRAVENOUS

## 2015-12-22 SURGICAL SUPPLY — 67 items
BAG DECANTER FOR FLEXI CONT (MISCELLANEOUS) IMPLANT
BLADE SURG 11 STRL SS (BLADE) ×4 IMPLANT
CANISTER SUCTION 2500CC (MISCELLANEOUS) ×4 IMPLANT
CATH KIT ON Q 5IN SLV (PAIN MANAGEMENT) IMPLANT
CATH ROBINSON RED A/P 22FR (CATHETERS) ×4 IMPLANT
CATH THORACIC 28FR (CATHETERS) ×4 IMPLANT
CATH THORACIC 36FR (CATHETERS) IMPLANT
CATH THORACIC 36FR RT ANG (CATHETERS) IMPLANT
CONT SPEC 4OZ CLIKSEAL STRL BL (MISCELLANEOUS) ×8 IMPLANT
COVER SURGICAL LIGHT HANDLE (MISCELLANEOUS) ×4 IMPLANT
DERMABOND ADVANCED (GAUZE/BANDAGES/DRESSINGS) ×2
DERMABOND ADVANCED .7 DNX12 (GAUZE/BANDAGES/DRESSINGS) ×2 IMPLANT
DRAPE C-ARM 42X72 X-RAY (DRAPES) IMPLANT
DRAPE LAPAROSCOPIC ABDOMINAL (DRAPES) ×4 IMPLANT
DRAPE WARM FLUID 44X44 (DRAPE) IMPLANT
ELECT REM PT RETURN 9FT ADLT (ELECTROSURGICAL) ×4
ELECTRODE REM PT RTRN 9FT ADLT (ELECTROSURGICAL) ×2 IMPLANT
GAUZE SPONGE 4X4 12PLY STRL (GAUZE/BANDAGES/DRESSINGS) ×4 IMPLANT
GLOVE BIO SURGEON STRL SZ7.5 (GLOVE) ×16 IMPLANT
GOWN STRL REUS W/ TWL LRG LVL3 (GOWN DISPOSABLE) ×10 IMPLANT
GOWN STRL REUS W/TWL LRG LVL3 (GOWN DISPOSABLE) ×10
KIT BASIN OR (CUSTOM PROCEDURE TRAY) ×8 IMPLANT
KIT PLEURX DRAIN CATH 1000ML (MISCELLANEOUS) IMPLANT
KIT PLEURX DRAIN CATH 15.5FR (DRAIN) ×4 IMPLANT
KIT ROOM TURNOVER OR (KITS) ×8 IMPLANT
KIT SUCTION CATH 14FR (SUCTIONS) ×4 IMPLANT
NEEDLE HYPO 25GX1X1/2 BEV (NEEDLE) IMPLANT
NS IRRIG 1000ML POUR BTL (IV SOLUTION) ×12 IMPLANT
PACK CHEST (CUSTOM PROCEDURE TRAY) ×4 IMPLANT
PACK GENERAL/GYN (CUSTOM PROCEDURE TRAY) IMPLANT
PAD ARMBOARD 7.5X6 YLW CONV (MISCELLANEOUS) ×16 IMPLANT
SEALANT SURG COSEAL 4ML (VASCULAR PRODUCTS) IMPLANT
SEALANT SURG COSEAL 8ML (VASCULAR PRODUCTS) ×4 IMPLANT
SET DRAINAGE LINE (MISCELLANEOUS) IMPLANT
SOLUTION ANTI FOG 6CC (MISCELLANEOUS) ×4 IMPLANT
SPONGE TONSIL 1 RF SGL (DISPOSABLE) ×4 IMPLANT
SUT CHROMIC 3 0 SH 27 (SUTURE) IMPLANT
SUT ETHILON 3 0 PS 1 (SUTURE) ×12 IMPLANT
SUT PROLENE 3 0 SH DA (SUTURE) IMPLANT
SUT PROLENE 4 0 RB 1 (SUTURE)
SUT PROLENE 4-0 RB1 .5 CRCL 36 (SUTURE) IMPLANT
SUT SILK  1 MH (SUTURE) ×4
SUT SILK 1 MH (SUTURE) ×4 IMPLANT
SUT SILK 2 0 SH (SUTURE) ×4 IMPLANT
SUT SILK 2 0SH CR/8 30 (SUTURE) IMPLANT
SUT SILK 3 0SH CR/8 30 (SUTURE) IMPLANT
SUT VIC AB 1 CTX 18 (SUTURE) ×8 IMPLANT
SUT VIC AB 2 TP1 27 (SUTURE) ×4 IMPLANT
SUT VIC AB 2-0 CT2 18 VCP726D (SUTURE) IMPLANT
SUT VIC AB 2-0 CTX 36 (SUTURE) ×8 IMPLANT
SUT VIC AB 3-0 SH 18 (SUTURE) ×4 IMPLANT
SUT VIC AB 3-0 SH 8-18 (SUTURE) ×4 IMPLANT
SUT VIC AB 3-0 X1 27 (SUTURE) ×4 IMPLANT
SUT VICRYL 0 UR6 27IN ABS (SUTURE) IMPLANT
SUT VICRYL 2 TP 1 (SUTURE) IMPLANT
SWAB COLLECTION DEVICE MRSA (MISCELLANEOUS) ×4 IMPLANT
SYR CONTROL 10ML LL (SYRINGE) IMPLANT
SYSTEM SAHARA CHEST DRAIN ATS (WOUND CARE) ×4 IMPLANT
TAPE CLOTH SURG 4X10 WHT LF (GAUZE/BANDAGES/DRESSINGS) ×4 IMPLANT
TIP APPLICATOR SPRAY EXTEND 16 (VASCULAR PRODUCTS) IMPLANT
TOWEL OR 17X24 6PK STRL BLUE (TOWEL DISPOSABLE) ×8 IMPLANT
TOWEL OR 17X26 10 PK STRL BLUE (TOWEL DISPOSABLE) ×12 IMPLANT
TRAP SPECIMEN MUCOUS 40CC (MISCELLANEOUS) IMPLANT
TRAY FOLEY CATH 16FRSI W/METER (SET/KITS/TRAYS/PACK) ×4 IMPLANT
TUBE ANAEROBIC SPECIMEN COL (MISCELLANEOUS) ×4 IMPLANT
VALVE REPLACEMENT CAP (MISCELLANEOUS) IMPLANT
WATER STERILE IRR 1000ML POUR (IV SOLUTION) ×12 IMPLANT

## 2015-12-22 NOTE — Progress Notes (Signed)
The patient was examined and preop studies reviewed. There has been no change from the prior exam and the patient is ready for surgery. Plan Left VATS drainage of effusion, chemical pleurodesis and placement of new pleurx catheter

## 2015-12-22 NOTE — Anesthesia Procedure Notes (Signed)
Procedure Name: Intubation Date/Time: 12/22/2015 11:38 AM Performed by: Bethel Born Pre-anesthesia Checklist: Patient identified, Emergency Drugs available, Suction available, Patient being monitored and Timeout performed Patient Re-evaluated:Patient Re-evaluated prior to inductionOxygen Delivery Method: Circle system utilized Preoxygenation: Pre-oxygenation with 100% oxygen Intubation Type: IV induction Ventilation: Mask ventilation without difficulty Laryngoscope Size: Miller and 2 Grade View: Grade I Tube type: Oral Endobronchial tube: Left, Double lumen EBT, EBT position confirmed by fiberoptic bronchoscope and EBT position confirmed by auscultation and 37 Fr Number of attempts: 1 Airway Equipment and Method: Fiberoptic brochoscope and Stylet Placement Confirmation: ETT inserted through vocal cords under direct vision,  positive ETCO2 and breath sounds checked- equal and bilateral Secured at: 29 cm Tube secured with: Tape Dental Injury: Teeth and Oropharynx as per pre-operative assessment

## 2015-12-22 NOTE — Anesthesia Postprocedure Evaluation (Signed)
Anesthesia Post Note  Patient: Sheena Caldwell  Procedure(s) Performed: Procedure(s) (LRB): VIDEO ASSISTED THORACOSCOPY (Left) DRAINAGE OF PLEURAL EFFUSION (Left) PLEURADESIS (Left) INSERTION PLEURAL DRAINAGE CATHETER (Left)  Patient location during evaluation: PACU Anesthesia Type: General Level of consciousness: awake and alert Pain management: pain level controlled Vital Signs Assessment: post-procedure vital signs reviewed and stable Respiratory status: spontaneous breathing, nonlabored ventilation, respiratory function stable and patient connected to nasal cannula oxygen Cardiovascular status: blood pressure returned to baseline and stable Postop Assessment: no signs of nausea or vomiting Anesthetic complications: no    Last Vitals:  Filed Vitals:   12/22/15 1330 12/22/15 1345  BP: 116/80 123/94  Pulse:  93  Temp: 36.6 C   Resp: 28 21    Last Pain:  Filed Vitals:   12/22/15 1358  PainSc: 0-No pain                 Fidencia Mccloud S

## 2015-12-22 NOTE — Transfer of Care (Addendum)
Immediate Anesthesia Transfer of Care Note  Patient: Sheena Caldwell  Procedure(s) Performed: Procedure(s): VIDEO ASSISTED THORACOSCOPY (Left) DRAINAGE OF PLEURAL EFFUSION (Left) PLEURADESIS (Left) INSERTION PLEURAL DRAINAGE CATHETER (Left)  Patient Location: PACU  Anesthesia Type:General  Level of Consciousness: oriented and patient cooperative, drowsy  Airway & Oxygen Therapy: Patient Spontanous Breathing and Patient connected to face mask oxygen  Post-op Assessment: Report given to RN and Post -op Vital signs reviewed and stable  Post vital signs: Reviewed and stable  Last Vitals:  Filed Vitals:   12/22/15 0835 12/22/15 1330  BP: 98/71   Pulse: 89   Temp: 36.7 C 36.6 C  Resp: 20     Last Pain:  Filed Vitals:   12/22/15 1336  PainSc: 0-No pain         Complications: No apparent anesthesia complications

## 2015-12-22 NOTE — Progress Notes (Signed)
Patient is currently admitted

## 2015-12-22 NOTE — Anesthesia Preprocedure Evaluation (Signed)
Anesthesia Evaluation  Patient identified by MRN, date of birth, ID band Patient awake    Reviewed: Allergy & Precautions, NPO status , Patient's Chart, lab work & pertinent test results  Airway Mallampati: II  TM Distance: >3 FB Neck ROM: Full    Dental no notable dental hx.    Pulmonary COPD, former smoker,    Pulmonary exam normal breath sounds clear to auscultation       Cardiovascular negative cardio ROS Normal cardiovascular exam Rhythm:Regular Rate:Normal     Neuro/Psych negative neurological ROS  negative psych ROS   GI/Hepatic negative GI ROS, Neg liver ROS,   Endo/Other  negative endocrine ROS  Renal/GU negative Renal ROS     Musculoskeletal negative musculoskeletal ROS (+) Arthritis ,   Abdominal   Peds  Hematology  (+) anemia ,   Anesthesia Other Findings   Reproductive/Obstetrics negative OB ROS                             Anesthesia Physical Anesthesia Plan  ASA: III  Anesthesia Plan: General   Post-op Pain Management:    Induction: Intravenous  Airway Management Planned: Double Lumen EBT and Oral ETT  Additional Equipment: Arterial line  Intra-op Plan:   Post-operative Plan: Extubation in OR  Informed Consent: I have reviewed the patients History and Physical, chart, labs and discussed the procedure including the risks, benefits and alternatives for the proposed anesthesia with the patient or authorized representative who has indicated his/her understanding and acceptance.   Dental advisory given  Plan Discussed with: CRNA  Anesthesia Plan Comments: (2 x PIV)        Anesthesia Quick Evaluation

## 2015-12-22 NOTE — Brief Op Note (Signed)
12/22/2015  1:06 PM  PATIENT:  Sheena Caldwell  65 y.o. female  PRE-OPERATIVE DIAGNOSIS:  LEFT MALIGNANT EFFUSION  POST-OPERATIVE DIAGNOSIS:  LEFT MALIGNANT EFFUSION  PROCEDURE:  Procedure(s): VIDEO ASSISTED THORACOSCOPY (Left) DRAINAGE OF PLEURAL EFFUSION (Left) PLEURADESIS (Left) INSERTION PLEURAL DRAINAGE CATHETER (Left)  SURGEON:  Surgeon(s) and Role:    * Ivin Poot, MD - Primary  PHYSICIAN ASSISTANT: Valentina Gu SA  ASSISTANTS:   ANESTHESIA:   general  EBL:  Total I/O In: -  Out: 100 [Urine:50; Blood:50]  BLOOD ADMINISTERED:none  DRAINS:28 F chest tube. L Pleurx Cath  LOCAL MEDICATIONS USED:  NONE  SPECIMEN:  Source of Specimen:  L pleural Peel and Excision  DISPOSITION OF SPECIMEN:  PATHOLOGY  COUNTS:  YES  TOURNIQUET:  * No tourniquets in log *  DICTATION: .Dragon Dictation  PLAN OF CARE: Admit to inpatient   PATIENT DISPOSITION:  PACU - hemodynamically stable.   Delay start of Pharmacological VTE agent (>24hrs) due to surgical blood loss or risk of bleeding: yes

## 2015-12-23 ENCOUNTER — Inpatient Hospital Stay (HOSPITAL_COMMUNITY): Payer: Medicare Other

## 2015-12-23 LAB — CBC
HCT: 27.9 % — ABNORMAL LOW (ref 36.0–46.0)
Hemoglobin: 8.7 g/dL — ABNORMAL LOW (ref 12.0–15.0)
MCH: 29.4 pg (ref 26.0–34.0)
MCHC: 31.2 g/dL (ref 30.0–36.0)
MCV: 94.3 fL (ref 78.0–100.0)
Platelets: 410 10*3/uL — ABNORMAL HIGH (ref 150–400)
RBC: 2.96 MIL/uL — ABNORMAL LOW (ref 3.87–5.11)
RDW: 17.5 % — ABNORMAL HIGH (ref 11.5–15.5)
WBC: 6.9 10*3/uL (ref 4.0–10.5)

## 2015-12-23 LAB — BASIC METABOLIC PANEL
Anion gap: 6 (ref 5–15)
BUN: 7 mg/dL (ref 6–20)
CO2: 25 mmol/L (ref 22–32)
Calcium: 8.5 mg/dL — ABNORMAL LOW (ref 8.9–10.3)
Chloride: 106 mmol/L (ref 101–111)
Creatinine, Ser: 0.69 mg/dL (ref 0.44–1.00)
GFR calc Af Amer: >60 mL/min (ref >60)
GFR calc non Af Amer: >60 mL/min (ref >60)
Glucose, Bld: 123 mg/dL — ABNORMAL HIGH (ref 65–99)
Potassium: 4 mmol/L (ref 3.5–5.1)
Sodium: 137 mmol/L (ref 135–145)

## 2015-12-23 LAB — BLOOD GAS, ARTERIAL
Acid-base deficit: 0.5 mmol/L (ref 0.0–2.0)
Bicarbonate: 23.3 mEq/L (ref 20.0–24.0)
Drawn by: 44135
FIO2: 0.32
O2 Saturation: 93.9 %
Patient temperature: 98.6
TCO2: 24.3 mmol/L (ref 0–100)
pCO2 arterial: 35.3 mmHg (ref 35.0–45.0)
pH, Arterial: 7.434 (ref 7.350–7.450)
pO2, Arterial: 72.5 mmHg — ABNORMAL LOW (ref 80.0–100.0)

## 2015-12-23 NOTE — Progress Notes (Addendum)
      [address]       [place] [address]             [phone]      1 Day Post-Op Procedure(s) (LRB): VIDEO ASSISTED THORACOSCOPY (Left) DRAINAGE OF PLEURAL EFFUSION (Left) PLEURADESIS (Left) INSERTION PLEURAL DRAINAGE CATHETER (Left)   Subjective:  Complains of pain, relieved with medications.   + nausea  Objective: Vital signs in last 24 hours: Temp:  [97.3 F (36.3 C)-98.9 F (37.2 C)] 98.9 F (37.2 C) (07/08 0756) Pulse Rate:  [72-93] 85 (07/08 0756) Cardiac Rhythm:  [-] Normal sinus rhythm (07/08 0756) Resp:  [11-28] 25 (07/08 0756) BP: (90-123)/(63-94) 96/76 mmHg (07/08 0756) SpO2:  [63 %-98 %] 91 % (07/08 0804) Arterial Line BP: (63-132)/(54-75) 63/59 mmHg (07/08 0756) Weight:  [141 lb 1.5 oz (64 kg)] 141 lb 1.5 oz (64 kg) (07/07 1514)  Intake/Output from previous day: 07/07 0701 - 07/08 0700 In: 1500 [I.V.:1400; IV Piggyback:100] Out: 1245 [Urine:1115; Blood:50; Chest Tube:80] Intake/Output this shift: Total I/O In: 290 [P.O.:240; IV Piggyback:50] Out: 0   General appearance: alert, cooperative and no distress Heart: regular rate and rhythm Lungs: diminished breath sounds on left base Abdomen: soft, non-tender; bowel sounds normal; no masses,  no organomegaly Wound: clean and dry Lab Results:  Recent Labs  12/22/15 0844 12/23/15 0404  WBC 5.4 6.9  HGB 9.3* 8.7*  HCT 29.4* 27.9*  PLT 403* 410*   BMET:  Recent Labs  12/22/15 0844 12/23/15 0404  NA 137 137  K 3.8 4.0  CL 106 106  CO2 22 25  GLUCOSE 94 123*  BUN 9 7  CREATININE 0.62 0.69  CALCIUM 8.8* 8.5*    PT/INR:  Recent Labs  12/22/15 0844  LABPROT 16.0*  INR 1.27   ABG    Component Value Date/Time   PHART 7.434 12/23/2015 0355   HCO3 23.3 12/23/2015 0355   TCO2 24.3 12/23/2015 0355   ACIDBASEDEF 0.5 12/23/2015 0355   O2SAT 93.9 12/23/2015 0355   CBG (last 3)  No results for input(s): GLUCAP in the last 72 hours.  Assessment/Plan: S/P  Procedure(s) (LRB): VIDEO ASSISTED THORACOSCOPY (Left) DRAINAGE OF PLEURAL EFFUSION (Left) PLEURADESIS (Left) INSERTION PLEURAL DRAINAGE CATHETER (Left)  1. Chest tube 250 cc output since surgery, no air leak on suction for now 2. Pulm- wean oxygen as tolerated, continue IS.Marland KitchenMarland KitchenCXR continues to show loculated effusion on left 3. D/C Arterial line 4. GI - nausea, continue zofran prn 5. Dispo- patient stable, d/c arterial line today, leave foley for now, chest tube to suction   LOS: 1 day    BARRETT, ERIN 12/23/2015  Lots of tumor in LLL at surgery- CXR with poor aeration Leave CT to suction today then water seal in am sun DC a-line and mobilize  patient examined and medical record reviewed,agree with above note. Tharon Aquas Trigt III 12/23/2015

## 2015-12-23 NOTE — Op Note (Signed)
NAMEKENYONNA, JASPER NO.:  [id number]  MEDICAL RECORD NO.:  [id number]  LOCATION:  3S13C                        FACILITY:  [facility]  PHYSICIAN:  Ivin Poot, M.D.  DATE OF BIRTH:  [birth year]  DATE OF PROCEDURE:  12/22/2015 DATE OF DISCHARGE:                              OPERATIVE REPORT   OPERATION:  Left VATS (video-assisted thoracoscopic surgery) with drainage of loculated malignant effusion, chemical pleurodesis with doxycycline, placement of PleurX catheter.  PREOPERATIVE DIAGNOSES:  Loculated left malignant effusion, history of endometrial carcinoma, advanced age.  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  SURGEON:  Ivin Poot, M.D.  ASSISTANT:  Valentina Gu, SA.  ANESTHESIA:  General.  CLINICAL NOTE:  The patient is a 65 year old female with advanced age endometrial cancer treated by Dr. Marko Plume.  She recently had shortness of breath and the thoracentesis removed almost 2 L of fluid.  She subsequently had a PleurX catheter placed by Interventional Radiology, which drained few 100 mL, but then after the catheter had been in place, the home nurse could only drain 1 ounce per visit.  Chest x-ray showed reaccumulation of the effusion and she became symptomatic with shortness of breath with exertion.  Thoracic Surgical consultation was performed in the office earlier this week and I recommended a left VATS to treat her loculated effusion and then to replace the PleurX catheter.  The patient understood the indications and benefits of this operation and I discussed the risks of infection, progressive cancer, recurrent effusion, and death.  She agreed to proceed with surgery.  OPERATIVE PROCEDURE:  The patient was brought to the operating room and placed supine on the operating table where general anesthesia was induced.  A double-lumen  endotracheal tube was positioned by the anesthesiologist.  The patient was turned in the left lateral decubitus position.  The left chest was prepped and draped as a sterile field. The previously-placed PleurX catheter was removed and the exit site sewn over with two 3-0 nylon sutures.  A small incision was made in the fifth interspace from the tip of the scapula anteriorly.  The ribs were gently spread.  The incision was approximately 5 cm long.  There was a loculated effusion in the left pleural space.  There were dense adhesions, which were taken down and cleared out.  Approximately 650 mL of fluid was drained from the pleural space.  The left lower lobe was entrapped and had heavy involvement with cancer in the parenchyma of the lung.  I then placed a new PleurX catheter in the basilar portion of the left pleural space to provide better drainage.  I then performed a chemical pleurodesis in the left pleural space using solution of doxycycline.  I then placed a 28 chest tube for temporary drainage of the pleural space postoperatively.  The lung was then re-expanded by the Anesthesia team and filled the pleural space well.  The ribs were reapproximated with interrupted pericostal #2 Vicryl.  The muscle layers were closed with interrupted Vicryl and the subcutaneous and skin layers were closed using running Vicryl.  The patient was extubated and returned  to the recovery room in stable condition.     Ivin Poot, M.D.     PV/MEDQ  D:  12/22/2015  T:  12/23/2015  Job:  [id number]  cc:   Gordy Levan, M.D.

## 2015-12-24 ENCOUNTER — Inpatient Hospital Stay (HOSPITAL_COMMUNITY): Payer: Medicare Other

## 2015-12-24 LAB — COMPREHENSIVE METABOLIC PANEL
ALT: 20 U/L (ref 14–54)
AST: 17 U/L (ref 15–41)
Albumin: 1.9 g/dL — ABNORMAL LOW (ref 3.5–5.0)
Alkaline Phosphatase: 108 U/L (ref 38–126)
Anion gap: 10 (ref 5–15)
BUN: 5 mg/dL — ABNORMAL LOW (ref 6–20)
CO2: 27 mmol/L (ref 22–32)
Calcium: 9.1 mg/dL (ref 8.9–10.3)
Chloride: 101 mmol/L (ref 101–111)
Creatinine, Ser: 0.64 mg/dL (ref 0.44–1.00)
GFR calc Af Amer: >60 mL/min (ref >60)
GFR calc non Af Amer: >60 mL/min (ref >60)
Glucose, Bld: 95 mg/dL (ref 65–99)
Potassium: 3.6 mmol/L (ref 3.5–5.1)
Sodium: 138 mmol/L (ref 135–145)
Total Bilirubin: 0.5 mg/dL (ref 0.3–1.2)
Total Protein: 8 g/dL (ref 6.5–8.1)

## 2015-12-24 LAB — CBC
HCT: 31.2 % — ABNORMAL LOW (ref 36.0–46.0)
Hemoglobin: 9.8 g/dL — ABNORMAL LOW (ref 12.0–15.0)
MCH: 29.7 pg (ref 26.0–34.0)
MCHC: 31.4 g/dL (ref 30.0–36.0)
MCV: 94.5 fL (ref 78.0–100.0)
Platelets: 469 10*3/uL — ABNORMAL HIGH (ref 150–400)
RBC: 3.3 MIL/uL — ABNORMAL LOW (ref 3.87–5.11)
RDW: 17.4 % — ABNORMAL HIGH (ref 11.5–15.5)
WBC: 7.3 10*3/uL (ref 4.0–10.5)

## 2015-12-24 NOTE — Progress Notes (Addendum)
      [address]       [place] [address]             [phone]      2 Days Post-Op Procedure(s) (LRB): VIDEO ASSISTED THORACOSCOPY (Left) DRAINAGE OF PLEURAL EFFUSION (Left) PLEURADESIS (Left) INSERTION PLEURAL DRAINAGE CATHETER (Left)   Subjective:  Sheena Caldwell states she is doing much better than yesterday.   Objective: Vital signs in last 24 hours: Temp:  [97.7 F (36.5 C)-98.8 F (37.1 C)] 97.7 F (36.5 C) (07/09 0722) Pulse Rate:  [85-105] 89 (07/09 0800) Cardiac Rhythm:  [-] Normal sinus rhythm (07/09 0800) Resp:  [19-27] 25 (07/09 0800) BP: (87-106)/(62-79) 99/73 mmHg (07/09 0722) SpO2:  [91 %-98 %] 95 % (07/09 0800)  Intake/Output from previous day: 07/08 0701 - 07/09 0700 In: 1800.2 [P.O.:1320; I.V.:230.2; IV Piggyback:250] Out: 2130 [Urine:2120; Chest Tube:10] Intake/Output this shift: Total I/O In: 80 [I.V.:30; IV Piggyback:50] Out: 350 [Urine:350]  General appearance: alert, cooperative and no distress Heart: regular rate and rhythm Lungs: diminished breath sounds bibasilar Abdomen: soft, non-tender; bowel sounds normal; no masses,  no organomegaly Wound: clean and dry  Lab Results:  Recent Labs  12/23/15 0404 12/24/15 0639  WBC 6.9 7.3  HGB 8.7* 9.8*  HCT 27.9* 31.2*  PLT 410* 469*   BMET:  Recent Labs  12/23/15 0404 12/24/15 0639  NA 137 138  K 4.0 3.6  CL 106 101  CO2 25 27  GLUCOSE 123* 95  BUN 7 5*  CREATININE 0.69 0.64  CALCIUM 8.5* 9.1    PT/INR:  Recent Labs  12/22/15 0844  LABPROT 16.0*  INR 1.27   ABG    Component Value Date/Time   PHART 7.434 12/23/2015 0355   HCO3 23.3 12/23/2015 0355   TCO2 24.3 12/23/2015 0355   ACIDBASEDEF 0.5 12/23/2015 0355   O2SAT 93.9 12/23/2015 0355   CBG (last 3)  No results for input(s): GLUCAP in the last 72 hours.  Assessment/Plan: S/P Procedure(s) (LRB): VIDEO ASSISTED THORACOSCOPY (Left) DRAINAGE OF PLEURAL EFFUSION (Left) PLEURADESIS  (Left) INSERTION PLEURAL DRAINAGE CATHETER (Left)  1. Chest tube- no air leak present, CXR remains stable, minimal output- place to water seal 2. Pulm- wean oxygen as tolerated, continue IS 3. GI- nausea persists, continue zofran, able to take liquids, and ensure 4. Dispo- chest tube to water seal, continue current care.. Start drainage pleur-x once chest tube removed   LOS: 2 days    Caldwell, Sheena 12/24/2015  Check chest x-ray in a.m. Minimal pleural effusion on x-ray but large amount of tumor burden in left lower lobe parenchyma We'll plan on removing chest tube Monday a.m. and then resume Pleurx drainage schedule  Sheena Caldwell M.D.

## 2015-12-25 ENCOUNTER — Encounter (HOSPITAL_COMMUNITY): Payer: Self-pay | Admitting: Cardiothoracic Surgery

## 2015-12-25 ENCOUNTER — Inpatient Hospital Stay (HOSPITAL_COMMUNITY): Payer: Medicare Other

## 2015-12-25 MED ORDER — ALBUTEROL SULFATE (2.5 MG/3ML) 0.083% IN NEBU
2.5000 mg | INHALATION_SOLUTION | Freq: Two times a day (BID) | RESPIRATORY_TRACT | Status: DC
  Administered 2015-12-25 – 2015-12-26 (×2): 2.5 mg via RESPIRATORY_TRACT
  Filled 2015-12-25: qty 3

## 2015-12-25 NOTE — Progress Notes (Signed)
Patient continues to refuse to walk in hall.  Patient encouraged to perform IS.  Current IS max was 1000.

## 2015-12-25 NOTE — Progress Notes (Signed)
      [address]       [place] [address]             [phone]       3 Days Post-Op Procedure(s) (LRB): VIDEO ASSISTED THORACOSCOPY (Left) DRAINAGE OF PLEURAL EFFUSION (Left) PLEURADESIS (Left) INSERTION PLEURAL DRAINAGE CATHETER (Left)  Subjective: Patient had bowel movement yesterday. No complaints this am.  Objective: Vital signs in last 24 hours: Temp:  [98.1 F (36.7 C)-100.6 F (38.1 C)] 98.6 F (37 C) (07/10 0426) Pulse Rate:  [87-111] 100 (07/10 0426) Cardiac Rhythm:  [-] Normal sinus rhythm (07/09 1959) Resp:  [16-28] 20 (07/10 0426) BP: (98-114)/(68-75) 98/68 mmHg (07/10 0426) SpO2:  [90 %-100 %] 97 % (07/10 0426)     Intake/Output from previous day: 07/09 0701 - 07/10 0700 In: 600 [P.O.:360; I.V.:190; IV Piggyback:50] Out: 975 [Urine:975]   Physical Exam:  Cardiovascular: RRR Pulmonary: Diminished left base but right is clear Abdomen: Soft, non tender, bowel sounds present. Extremities: No lower extremity edema. Wounds: Dressing is lean and dry.   Chest Tube: to water seal, no air leak.  Lab Results: CBC: Recent Labs  12/23/15 0404 12/24/15 0639  WBC 6.9 7.3  HGB 8.7* 9.8*  HCT 27.9* 31.2*  PLT 410* 469*   BMET:  Recent Labs  12/23/15 0404 12/24/15 0639  NA 137 138  K 4.0 3.6  CL 106 101  CO2 25 27  GLUCOSE 123* 95  BUN 7 5*  CREATININE 0.69 0.64  CALCIUM 8.5* 9.1    PT/INR:  Recent Labs  12/22/15 0844  LABPROT 16.0*  INR 1.27   ABG:  INR: Will add last result for INR, ABG once components are confirmed Will add last 4 CBG results once components are confirmed  Assessment/Plan:  1. CV - SR in the 70's. 2.  Pulmonary - Chest tube is to water seal. There is NO air leak. Chest tube with scant drainage. On room air. CXR shows no pneumothorax, side port appears to be outside left chest wall,left pleural effusion/ atelectasis/tumor. Remove chest tube and begin Pleur X drainage in am.Encourage incentive  spirometer. Pathology results not available yet. 3.   ZIMMERMAN,DONIELLE MPA-C 12/25/2015,7:57 AM

## 2015-12-25 NOTE — Discharge Summary (Signed)
Physician Discharge Summary  Patient ID: Sheena Caldwell MRN: [id number] DOB/AGE: [birth year] 65 y.o.  Admit date: 12/22/2015 Discharge date: 12/26/2015  Admission Diagnoses:Pleural effusion  Discharge Diagnoses:  Active Problems:   Pleural effusion, malignant   Patient Active Problem List   Diagnosis Date Noted  . Pleural effusion, malignant 12/22/2015  . International Federation of Gynecology and Obstetrics (FIGO) stage IVB malignant neoplasm of endometrium ([place]) 12/17/2015  . Endometrial cancer, FIGO stage IVB ([place]) 12/17/2015  . Anemia due to other cause 12/17/2015  . Pleural effusion 11/30/2015  . 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 Hypoxia   . Cachexia ([place])   . Moderate malnutrition ([place])   . Port catheter in place 09/10/2015  . Endometrial cancer, FIGO stage IIIC ([place]) 09/10/2015  . Iron deficiency anemia due to chronic blood loss 09/10/2015  . Antineoplastic chemotherapy induced anemia 10/08/2014  . Arterial hypotension 10/08/2014  . Elevated blood protein 10/08/2014  . Endometrial carcinoma ([place]) 10/08/2014  . Pyrexia 10/01/2014  . Hematuria, undiagnosed cause 09/16/2014  . Chemotherapy-induced peripheral neuropathy ([place]) 08/23/2014  . Macrocytic anemia 08/23/2014  . Hematuria 08/15/2014  . Abdominal pain 08/15/2014  . Hypotension 08/15/2014  . Weakness 08/13/2014  . Fever 08/13/2014  . Constipation 08/13/2014  . Anorexia 08/13/2014  . Dehydration 08/13/2014  . Hypoalbuminemia 08/13/2014  . Transaminitis 08/13/2014  . Hyperbilirubinemia 08/13/2014  . Tobacco abuse, episodic 08/09/2014  . Chemotherapy induced nausea and vomiting 08/09/2014  . Chemotherapy induced neutropenia ([place]) 08/09/2014  . Portacath in place 08/09/2014  . Broken teeth 08/09/2014  . Poor dentition 08/09/2014  . Endometrial cancer ([place]) 08/05/2014  . COPD (chronic obstructive pulmonary disease) ([place]) 02/19/2012  . Macrocytosis without anemia 02/19/2012    HPI: 65 year old AA female  nonsmoker referred for evaluation and treatment of a malignant left pleural effusion. Patient has history of endometrial carcinoma last treated approximately 2 years ago. Last month she presented with several weeks of cough weakness and shortness of breath not responsive to antidepressives. She was admitted to [facility] via the emergency department after chest x-ray showed a large left pleural effusion filling up two thirds of the left pleural space. Thoracentesis removed 2 L. A subsequent Pleurx catheter was placed by interventional radiology which removed 800 cc. Chest x-ray still showed significant subpulmonic fluid which was undrained. 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  Home health nursing has not been able to remove significant fluid from the Pleurx catheter. The patient has been evaluated by oncology-Dr. Marko Plume and is being prepared for chemotherapy. Her hemoglobin was low at 8.5 and she was scheduled for IV iron therapy later this week.  She was referred for video-assisted thoracoscopy.  Discharged Condition: fair  Hospital Course: The patient was admitted electively and on 12/22/2015 was taken to the operating room where she underwent the below described procedure. She tolerated the procedure well and was taken to the post anesthesia care unit in stable condition  Postoperative hospital course: The chest tube was monitored in the usual manner. Drainage decreased over time and x-ray showed improvement in the pleural effusion. It is notable that she does have a large amount of tumor burden in the left lower lobe parenchyma. She will be restarted on a Pleurx drainage schedule. She has had some postoperative pain but this is showed steady improvement over time. Oxygen has been weaned and she has good saturations on room air. She is hemodynamically stable. Incisions are healing without evidence of  infection. She is tolerating gradually increasing activities using standard protocols. Currently her status is felt to be tentatively stable for discharge in the next 24 hours pending ongoing reevaluation of her overall recovery.  Consults: None  Significant DiagnostiDATE OF PROCEDURE: 12/22/2015 DATE OF DISCHARGE:   OPERATIVE REPORT   OPERATION: Left VATS (video-assisted thoracoscopic surgery) with drainage of loculated malignant effusion, chemical pleurodesis with doxycycline, placement of PleurX catheter.  PREOPERATIVE DIAGNOSES: Loculated left malignant effusion, history of endometrial carcinoma, advanced age.  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  SURGEON: Ivin Poot, M.D.  ASSISTANT: Valentina Gu, SA.  ANESTHESIA: General.c Studies: routine post-op lab/CXR  Treatments: surgery:   Disposition: 06-Home-Health Care Svc      Discharge Instructions    Care order/instruction    Complete by:  As directed   H/H Nurse, Please remove sutures in 1 week            Medication List    TAKE these medications        acetaminophen 500 MG tablet  Commonly known as:  TYLENOL  Take 2 tablets (1,000 mg total) by mouth every 6 (six) hours as needed for mild pain or fever.     albuterol 108 (90 Base) MCG/ACT inhaler  Commonly known as:  PROVENTIL HFA;VENTOLIN HFA  Inhale 1 puff into the lungs every 6 (six) hours as needed for wheezing or shortness of breath.     CALCIUM + D PO  Take 1 tablet by mouth daily.     cholecalciferol 1000 units tablet  Commonly known as:  VITAMIN D  Take 1,000 Units by mouth daily.     ferrous sulfate 325 (65 FE) MG tablet  Take 325 mg by mouth daily.     fludrocortisone 0.1 MG tablet  Commonly known as:  FLORINEF  Take 0.05 mg by mouth daily.     MULTI-VITAMINS Tabs  Take 1 tablet  by mouth daily.     oxyCODONE 5 MG immediate release tablet  Commonly known as:  Oxy IR/ROXICODONE  Take 1-2 tablets (5-10 mg total) by mouth every 4 (four) hours as needed for severe pain.     polyethylene glycol packet  Commonly known as:  MIRALAX / GLYCOLAX  Take 17 g by mouth daily as needed for mild constipation.     promethazine 12.5 MG tablet  Commonly known as:  PHENERGAN  Take 1 tablet (12.5 mg total) by mouth every 6 (six) hours as needed for nausea or vomiting.     UNABLE TO FIND  Med Name: smooth move drink for constipation       Follow-up Information    Follow up with [facility].   Why:  Home Health RN   Contact information:   [address] [place] [address] [phone]       Follow up with Len Childs, MD.   Specialty:  Cardiothoracic Surgery   Why:  01/10/2016 11:30 AM to see the surgeon. Please obtain a chest x-ray at [place] at [facility] is located in the same office complex.   Contact information:   [address] [place] [address] [phone]       Signed: Ellwood Handler 12/26/2015, 9:02 AM

## 2015-12-25 NOTE — Care Management Important Message (Signed)
Important Message  Patient Details  Name: Sheena Caldwell MRN: [id number] Date of Birth: 09-14-1950   Medicare Important Message Given:  Yes    [name] 12/25/2015, 8:26 AM

## 2015-12-25 NOTE — Care Management Note (Addendum)
Case Management Note  Patient Details  Name: Sheena Caldwell MRN: [id number] Date of Birth: Mar 02, 1951  Subjective/Objective:  Left pleural effusion, metastatic disease                 Action/Plan: Discharge Planning: NCM spoke to pt at bedside and she is active with [facility] for [facility]. States she has 4 bottles at home. And her Oncologist, Dr. Marko Plume was ordering her supplies from his office. Will follow up with surgeon to complete paperwork for [facility] to have supplies sent to home. Will notify [facility] on 12/26/2015 pt scheduled to dc home with pleurx cath. Will order bottles for pt to dc home. Will have PT eval and tx for possible HH PT. Pt states she has been feeling weak at home. She is requesting a tub bench and 3n1 for home. Will contacted [facility] DME rep for equipment for home. Has RW at home.   12/26/2015 Contacted [facility] for DME for home and [facility] Liaison to follow up on [facility] RN. Contacted [facility], [facility] and states they did have pt's info on file but states since pt has Medicare and not Medicaid, the [facility] agency would order her additional supplies. Pt was dc home with 9 additional bottles. Notified [facility] Liaison and they will help with getting PleurX Cath supplies.   Expected Discharge Date:  12/26/2015              Expected Discharge Plan:  [place]  In-House Referral:  NA  Discharge planning Services  CM Consult  Post Acute Care Choice:  Home Health, Resumption of Svcs/PTA Provider Choice offered to:  Patient  DME Arranged:  3-N-1, Tub bench DME Agency:  [place]:  RN [facility] Agency:  [place]  Status of Service:  Completed, signed off  If discussed at [place] of Stay Meetings, dates discussed:    Additional Comments:  Erenest Rasher, RN 12/25/2015, 5:49 PM

## 2015-12-26 ENCOUNTER — Inpatient Hospital Stay (HOSPITAL_COMMUNITY): Payer: Medicare Other

## 2015-12-26 LAB — BODY FLUID CULTURE: Culture: NO GROWTH

## 2015-12-26 MED ORDER — ACETAMINOPHEN 500 MG PO TABS: 1000.0000 mg | ORAL_TABLET | Freq: Four times a day (QID) | ORAL | Status: DC | PRN

## 2015-12-26 MED ORDER — OXYCODONE HCL 5 MG PO TABS: 5.0000 mg | ORAL_TABLET | ORAL | Status: DC | PRN

## 2015-12-26 NOTE — Progress Notes (Signed)
Using strict sterile technique and a PleurX kit, the PleurX drain was connected to drainage.  Approximately 20 ml of serosanginous effluent was returned.  PA for Trauma team present at the time of draining and noted the small return of fluid.  Site was dressed with sterile foam and gauze and taped in place per kit instructions.  Patient tolerated procedure well.

## 2015-12-26 NOTE — Progress Notes (Signed)
Discharge instructions reviewed with patient.  Oxycodone script provided.  All home medical equipment has been delivered and made available to patient (shower chair, bedside commode, PleurX drain kits).  Patient dressing self.  She will leave with her Aunt.

## 2015-12-26 NOTE — Progress Notes (Signed)
      [address]       [place] [address]             [phone]      4 Days Post-Op Procedure(s) (LRB): VIDEO ASSISTED THORACOSCOPY (Left) DRAINAGE OF PLEURAL EFFUSION (Left) PLEURADESIS (Left) INSERTION PLEURAL DRAINAGE CATHETER (Left)   Subjective:  No new complaints.  Nurse at bedside draining Pleur-x  Objective: Vital signs in last 24 hours: Temp:  [97.9 F (36.6 C)-99.3 F (37.4 C)] 97.9 F (36.6 C) (07/11 0735) Pulse Rate:  [84-99] 87 (07/11 0735) Cardiac Rhythm:  [-] Normal sinus rhythm (07/11 0737) Resp:  [16-21] 20 (07/11 0735) BP: (97-113)/(66-77) 100/66 mmHg (07/11 0735) SpO2:  [92 %-95 %] 92 % (07/11 0751)  Intake/Output from previous day: 07/10 0701 - 07/11 0700 In: 480 [P.O.:480] Out: 150 [Urine:150]  General appearance: alert, cooperative and no distress Heart: regular rate and rhythm Lungs: diminished breath sounds on left Abdomen: soft, non-tender; bowel sounds normal; no masses,  no organomegaly Wound: clean and dry  Lab Results:  Recent Labs  12/24/15 0639  WBC 7.3  HGB 9.8*  HCT 31.2*  PLT 469*   BMET:  Recent Labs  12/24/15 0639  NA 138  K 3.6  CL 101  CO2 27  GLUCOSE 95  BUN 5*  CREATININE 0.64  CALCIUM 9.1    PT/INR: No results for input(s): LABPROT, INR in the last 72 hours. ABG    Component Value Date/Time   PHART 7.434 12/23/2015 0355   HCO3 23.3 12/23/2015 0355   TCO2 24.3 12/23/2015 0355   ACIDBASEDEF 0.5 12/23/2015 0355   O2SAT 93.9 12/23/2015 0355   CBG (last 3)  No results for input(s): GLUCAP in the last 72 hours.  Assessment/Plan: S/P Procedure(s) (LRB): VIDEO ASSISTED THORACOSCOPY (Left) DRAINAGE OF PLEURAL EFFUSION (Left) PLEURADESIS (Left) INSERTION PLEURAL DRAINAGE CATHETER (Left)  1. Chest tube- removed yesterday, CXR is stable with large left pleural effusion, and tumor involvement of left lower lobe 2. Pleur-x- drained by nursing today, minimal drainage... Will resume  previous T/TH/Sat drainage 3. Dispo- patient stable, will d/c home today   LOS: 4 days    Ahmed Prima, Graciemae Delisle 12/26/2015

## 2015-12-26 NOTE — Discharge Instructions (Addendum)
Thoracoscopy, Care After Refer to this sheet in the next few weeks. These instructions provide you with information about caring for yourself after your procedure. Your health care provider may also give you more specific instructions. Your treatment has been planned according to current medical practices, but problems sometimes occur. Call your health care provider if you have any problems or questions after your procedure. WHAT TO EXPECT AFTER THE PROCEDURE: After your procedure, it is common to feel sore for up to two weeks. HOME CARE INSTRUCTIONS  There are many different ways to close and cover an incision, including stitches (sutures), skin glue, and adhesive strips. Follow your health care provider's instructions about:  Incision care.  Bandage (dressing) changes and removal.  Incision closure removal.  Check your incision area every day for signs of infection. Watch for:  Redness, swelling, or pain.  Fluid, blood, or pus.  Take medicines only as directed by your health care provider.  Try to cough often. Coughing helps to protect against lung infection (pneumonia). It may hurt to cough. If this happens, hold a pillow against your chest when you cough.  Take deep breaths. This also helps to protect against pneumonia.  If you were given an incentive spirometer, use it as directed by your health care provider.  Do not take baths, swim, or use a hot tub until your health care provider approves. You may take showers.  Avoid lifting until your health care provider approves.  Avoid driving until your health care provider approves.  Do not travel by airplane after the chest tube is removed until your health care provider approves. SEEK MEDICAL CARE IF:  You have a fever.  Pain medicines do not ease your pain.  You have redness, swelling, or increasing pain in your incision area.  You develop a cough that does not go away, or you are coughing up mucus that is yellow or  green. SEEK IMMEDIATE MEDICAL CARE IF:  You have fluid, blood, or pus coming from your incision.  There is a bad smell coming from your incision or dressing.  You develop a rash.  You have difficulty breathing.  You cough up blood.  You develop light-headedness or you feel faint.  You develop chest pain.  Your heartbeat feels irregular or very fast.   This information is not intended to replace advice given to you by your health care provider. Make sure you discuss any questions you have with your health care provider.   Document Released: 12/21/2004 Document Revised: 06/24/2014 Document Reviewed: 02/16/2014 Elsevier Interactive Patient Education 2016 [place] Nurse to remove sutures in one week. Drain PleurX every Tues, Thursday, Saturday and record drainage.  Use PleurX kits provided to patient.  PleurX has been drained Tuesday 12/26/15 prior to leaving hospital.

## 2015-12-27 LAB — ANAEROBIC CULTURE

## 2015-12-28 ENCOUNTER — Ambulatory Visit: Payer: Medicaid Other | Admitting: Oncology

## 2015-12-28 ENCOUNTER — Other Ambulatory Visit: Payer: Medicaid Other

## 2015-12-28 ENCOUNTER — Telehealth: Payer: Self-pay

## 2015-12-28 DIAGNOSIS — J910 Malignant pleural effusion: Secondary | ICD-10-CM | POA: Diagnosis not present

## 2015-12-28 NOTE — Telephone Encounter (Signed)
Nurse with [facility] called stating pt is established with them now. She did pleurex drain today and got 60 cc. The next appt is Saturday. She was also verifying Dr Marko Plume as doctor of record.

## 2015-12-29 ENCOUNTER — Ambulatory Visit (HOSPITAL_BASED_OUTPATIENT_CLINIC_OR_DEPARTMENT_OTHER): Payer: Medicare Other

## 2015-12-29 ENCOUNTER — Ambulatory Visit: Payer: Medicare Other

## 2015-12-29 ENCOUNTER — Other Ambulatory Visit: Payer: Self-pay | Admitting: Oncology

## 2015-12-29 VITALS — BP 103/67 | HR 88 | Temp 97.6°F | Resp 18

## 2015-12-29 DIAGNOSIS — D500 Iron deficiency anemia secondary to blood loss (chronic): Secondary | ICD-10-CM

## 2015-12-29 DIAGNOSIS — C541 Malignant neoplasm of endometrium: Secondary | ICD-10-CM

## 2015-12-29 DIAGNOSIS — D509 Iron deficiency anemia, unspecified: Secondary | ICD-10-CM | POA: Diagnosis not present

## 2015-12-29 DIAGNOSIS — J90 Pleural effusion, not elsewhere classified: Secondary | ICD-10-CM

## 2015-12-29 MED ORDER — SODIUM CHLORIDE 0.9% FLUSH
10.0000 mL | INTRAVENOUS | Status: AC | PRN
  Administered 2015-12-29: 10 mL
  Filled 2015-12-29: qty 10

## 2015-12-29 MED ORDER — HEPARIN SOD (PORK) LOCK FLUSH 100 UNIT/ML IV SOLN
500.0000 [IU] | INTRAVENOUS | Status: AC | PRN
  Administered 2015-12-29: 500 [IU]
  Filled 2015-12-29: qty 5

## 2015-12-29 MED ORDER — SODIUM CHLORIDE 0.9 % IV SOLN
510.0000 mg | Freq: Once | INTRAVENOUS | Status: AC
  Administered 2015-12-29: 510 mg via INTRAVENOUS
  Filled 2015-12-29: qty 17

## 2015-12-29 NOTE — Patient Instructions (Signed)

## 2015-12-29 NOTE — Progress Notes (Signed)
Injection of Granix to be given in injection room

## 2015-12-30 ENCOUNTER — Ambulatory Visit: Payer: Medicaid Other

## 2016-01-01 ENCOUNTER — Telehealth: Payer: Self-pay

## 2016-01-01 DIAGNOSIS — J910 Malignant pleural effusion: Secondary | ICD-10-CM

## 2016-01-01 DIAGNOSIS — C541 Malignant neoplasm of endometrium: Secondary | ICD-10-CM

## 2016-01-01 NOTE — Telephone Encounter (Signed)
Sheena Caldwell called earlier stating that her left pleurx catheter is only draining ~ 2 tablespoons.  She thought the new one would take care of fluid and she would feel better. Sheena Caldwell is not in any acute distress.  She is tired.  Occasionally having some SOB.  She is using the albuterol  inhaler with good effect. Dr. Johny Blamer to get a CXR pa and ;at to assess lungs prior to visit.  Sheena Caldwell cannot come prior to 01-04-16 due to transportation.  She will have CXR prior to visit on 01-04-16~1000 at [facility] radiology.

## 2016-01-03 ENCOUNTER — Telehealth: Payer: Self-pay

## 2016-01-03 ENCOUNTER — Other Ambulatory Visit: Payer: Self-pay | Admitting: Oncology

## 2016-01-03 ENCOUNTER — Telehealth: Payer: Self-pay | Admitting: Oncology

## 2016-01-03 NOTE — Telephone Encounter (Signed)
pt will get updated sched tomorrow

## 2016-01-03 NOTE — Telephone Encounter (Signed)
Spoke with Sheena Caldwell and told her to contact Dr.Van Trigt as noted below by Dr Marko Plume.

## 2016-01-03 NOTE — Telephone Encounter (Signed)
Patient Demographics     Patient Name Sex DOB SSN Address Phone    Sheena Caldwell, Sheena Caldwell Female 1951/03/31 [id number] [place] [address] [phone] (Home) *Preferred* [phone] (Mobile)      Message  Received: Today    Gordy Levan, MD  Baruch Merl, RN           [facility] should contact Dr Prescott Gum for pleural catheter questions  thanks

## 2016-01-03 NOTE — Telephone Encounter (Signed)
LM for Tiffany that Ms. Coverstone is to have a CXR and visit Thursday 01-04-16 with Dr Marko Plume. Will let her know after this visit if Dr. Marko Plume will ok decreasing visits from 3 x per week to 2 times a week to drain left pleur-X catheter since drainage is very minimal ~2 tlbs. Tiffany's # [id number].

## 2016-01-04 ENCOUNTER — Other Ambulatory Visit: Payer: Self-pay

## 2016-01-04 ENCOUNTER — Other Ambulatory Visit (HOSPITAL_BASED_OUTPATIENT_CLINIC_OR_DEPARTMENT_OTHER): Payer: Medicare Other

## 2016-01-04 ENCOUNTER — Encounter: Payer: Self-pay | Admitting: *Deleted

## 2016-01-04 ENCOUNTER — Ambulatory Visit (HOSPITAL_COMMUNITY)
Admission: RE | Admit: 2016-01-04 | Discharge: 2016-01-04 | Disposition: A | Discharge status: Home / Self Care | Payer: Medicare Other | Source: Ambulatory Visit | Attending: Oncology | Admitting: Oncology

## 2016-01-04 ENCOUNTER — Ambulatory Visit (HOSPITAL_BASED_OUTPATIENT_CLINIC_OR_DEPARTMENT_OTHER): Payer: Medicare Other | Admitting: Oncology

## 2016-01-04 ENCOUNTER — Encounter: Payer: Self-pay | Admitting: Oncology

## 2016-01-04 ENCOUNTER — Ambulatory Visit (HOSPITAL_BASED_OUTPATIENT_CLINIC_OR_DEPARTMENT_OTHER): Payer: Medicare Other

## 2016-01-04 VITALS — BP 96/69 | HR 85 | Temp 98.2°F | Resp 18 | Ht 66.0 in | Wt 133.9 lb

## 2016-01-04 DIAGNOSIS — G893 Neoplasm related pain (acute) (chronic): Secondary | ICD-10-CM

## 2016-01-04 DIAGNOSIS — Z5111 Encounter for antineoplastic chemotherapy: Secondary | ICD-10-CM

## 2016-01-04 DIAGNOSIS — D500 Iron deficiency anemia secondary to blood loss (chronic): Secondary | ICD-10-CM

## 2016-01-04 DIAGNOSIS — Z95828 Presence of other vascular implants and grafts: Secondary | ICD-10-CM | POA: Diagnosis not present

## 2016-01-04 DIAGNOSIS — C541 Malignant neoplasm of endometrium: Secondary | ICD-10-CM | POA: Insufficient documentation

## 2016-01-04 DIAGNOSIS — D6489 Other specified anemias: Secondary | ICD-10-CM

## 2016-01-04 DIAGNOSIS — C7800 Secondary malignant neoplasm of unspecified lung: Secondary | ICD-10-CM

## 2016-01-04 DIAGNOSIS — J910 Malignant pleural effusion: Secondary | ICD-10-CM

## 2016-01-04 LAB — COMPREHENSIVE METABOLIC PANEL
ALBUMIN: 2.1 g/dL — AB (ref 3.5–5.0)
ALK PHOS: 205 U/L — AB (ref 40–150)
ALT: 60 U/L — AB (ref 0–55)
AST: 47 U/L — AB (ref 5–34)
Anion Gap: 10 mEq/L (ref 3–11)
BUN: 10.5 mg/dL (ref 7.0–26.0)
CALCIUM: 9.8 mg/dL (ref 8.4–10.4)
CO2: 26 mEq/L (ref 22–29)
CREATININE: 0.8 mg/dL (ref 0.6–1.1)
Chloride: 101 mEq/L (ref 98–109)
EGFR: >90 mL/min/{1.73_m2} (ref >90)
Glucose: 117 mg/dl (ref 70–140)
Potassium: 4 mEq/L (ref 3.5–5.1)
Sodium: 137 mEq/L (ref 136–145)
Total Protein: 9.4 g/dL — ABNORMAL HIGH (ref 6.4–8.3)

## 2016-01-04 LAB — CBC WITH DIFFERENTIAL/PLATELET
BASO%: 0.4 % (ref 0.0–2.0)
BASOS ABS: 0 10*3/uL (ref 0.0–0.1)
EOS%: 0.4 % (ref 0.0–7.0)
Eosinophils Absolute: 0 10*3/uL (ref 0.0–0.5)
HEMATOCRIT: 27.5 % — AB (ref 34.8–46.6)
HEMOGLOBIN: 8.6 g/dL — AB (ref 11.6–15.9)
LYMPH#: 1.2 10*3/uL (ref 0.9–3.3)
LYMPH%: 17.6 % (ref 14.0–49.7)
MCH: 29.2 pg (ref 25.1–34.0)
MCHC: 31.3 g/dL — ABNORMAL LOW (ref 31.5–36.0)
MCV: 93.2 fL (ref 79.5–101.0)
MONO#: 0.6 10*3/uL (ref 0.1–0.9)
MONO%: 9 % (ref 0.0–14.0)
NEUT#: 4.9 10*3/uL (ref 1.5–6.5)
NEUT%: 72.6 % (ref 38.4–76.8)
PLATELETS: 472 10*3/uL — AB (ref 145–400)
RBC: 2.95 10*6/uL — ABNORMAL LOW (ref 3.70–5.45)
RDW: 18.9 % — AB (ref 11.2–14.5)
WBC: 6.7 10*3/uL (ref 3.9–10.3)

## 2016-01-04 MED ORDER — SODIUM CHLORIDE 0.9 % IV SOLN
20.0000 mg | Freq: Once | INTRAVENOUS | Status: AC
  Administered 2016-01-04: 20 mg via INTRAVENOUS
  Filled 2016-01-04: qty 2

## 2016-01-04 MED ORDER — SODIUM CHLORIDE 0.9 % IV SOLN
Freq: Once | INTRAVENOUS | Status: AC
  Administered 2016-01-04: 13:00:00 via INTRAVENOUS

## 2016-01-04 MED ORDER — PALONOSETRON HCL INJECTION 0.25 MG/5ML
INTRAVENOUS | Status: AC
  Filled 2016-01-04: qty 5

## 2016-01-04 MED ORDER — TRAMADOL HCL 50 MG PO TABS: 50.0000 mg | ORAL_TABLET | Freq: Four times a day (QID) | ORAL | Status: DC | PRN

## 2016-01-04 MED ORDER — PALONOSETRON HCL INJECTION 0.25 MG/5ML
0.2500 mg | Freq: Once | INTRAVENOUS | Status: AC
  Administered 2016-01-04: 0.25 mg via INTRAVENOUS

## 2016-01-04 MED ORDER — CARBOPLATIN CHEMO INTRADERMAL TEST DOSE 100MCG/0.02ML
100.0000 ug | Freq: Once | INTRADERMAL | Status: AC
  Administered 2016-01-04: 100 ug via INTRADERMAL
  Filled 2016-01-04: qty 0.01

## 2016-01-04 MED ORDER — SODIUM CHLORIDE 0.9 % IV SOLN
80.0000 mg/m2 | Freq: Once | INTRAVENOUS | Status: AC
  Administered 2016-01-04: 138 mg via INTRAVENOUS
  Filled 2016-01-04: qty 23

## 2016-01-04 MED ORDER — CARBOPLATIN CHEMO INJECTION 600 MG/60ML
490.5000 mg | Freq: Once | INTRAVENOUS | Status: AC
  Administered 2016-01-04: 490 mg via INTRAVENOUS
  Filled 2016-01-04: qty 49

## 2016-01-04 MED ORDER — DIPHENHYDRAMINE HCL 50 MG/ML IJ SOLN
INTRAMUSCULAR | Status: AC
  Filled 2016-01-04: qty 1

## 2016-01-04 MED ORDER — FAMOTIDINE IN NACL 20-0.9 MG/50ML-% IV SOLN
20.0000 mg | Freq: Once | INTRAVENOUS | Status: AC
  Administered 2016-01-04: 20 mg via INTRAVENOUS

## 2016-01-04 MED ORDER — SODIUM CHLORIDE 0.9% FLUSH
10.0000 mL | INTRAVENOUS | Status: DC | PRN
  Administered 2016-01-04: 10 mL
  Filled 2016-01-04: qty 10

## 2016-01-04 MED ORDER — DIPHENHYDRAMINE HCL 50 MG/ML IJ SOLN
25.0000 mg | Freq: Once | INTRAMUSCULAR | Status: AC
  Administered 2016-01-04: 25 mg via INTRAVENOUS

## 2016-01-04 MED ORDER — FAMOTIDINE IN NACL 20-0.9 MG/50ML-% IV SOLN
INTRAVENOUS | Status: AC
  Filled 2016-01-04: qty 50

## 2016-01-04 MED ORDER — HEPARIN SOD (PORK) LOCK FLUSH 100 UNIT/ML IV SOLN
500.0000 [IU] | Freq: Once | INTRAVENOUS | Status: AC | PRN
  Administered 2016-01-04: 500 [IU]
  Filled 2016-01-04: qty 5

## 2016-01-04 NOTE — Progress Notes (Signed)
[place] Work  Clinical Social Work was referred by patient for assistance with SCAT application.  Clinical Social Worker met with patient at [facility] during infusion, completed SCAT application with pt. CSW also reviewed Support Programs and resources for additional support. CSW encouraged pt to consider [facility] and GYN support groups for support. Pt denied other needs currently, but may need a CNA through medicaid in near future. CSW educated pt to update her medical team on care concerns at home as they arise. CSW provided pt with Support Team contacts and she is aware to reach out to [place] as well.   Clinical Social Work interventions: Catering manager and referral SCAT application assistance  Loren Racer, [place] Worker [place]  [place] Phone: [phone] Fax: [phone]

## 2016-01-04 NOTE — Patient Instructions (Signed)
[place] Discharge Instructions for Patients Receiving Chemotherapy  Today you received the following chemotherapy agents: Taxol and Carboplatin.  To help prevent nausea and vomiting after your treatment, we encourage you to take your nausea medication Phenergan 25 mg every 6 hours as needed.   If you develop nausea and vomiting that is not controlled by your nausea medication, call the clinic.   BELOW ARE SYMPTOMS THAT SHOULD BE REPORTED IMMEDIATELY:  *FEVER GREATER THAN 100.5 F  *CHILLS WITH OR WITHOUT FEVER  NAUSEA AND VOMITING THAT IS NOT CONTROLLED WITH YOUR NAUSEA MEDICATION  *UNUSUAL SHORTNESS OF BREATH  *UNUSUAL BRUISING OR BLEEDING  TENDERNESS IN MOUTH AND THROAT WITH OR WITHOUT PRESENCE OF ULCERS  *URINARY PROBLEMS  *BOWEL PROBLEMS  UNUSUAL RASH Items with * indicate a potential emergency and should be followed up as soon as possible.  Feel free to call the clinic you have any questions or concerns. The clinic phone number is [phone].  Please show the [place] at check-in to the Emergency Department and triage nurse.

## 2016-01-04 NOTE — Progress Notes (Addendum)
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 Patient was hospitalized 6-15 thru 12-06-15 with respiratory symptoms from the recurrent, metastatic endometrial cancer. Left pleurex by IR for the malignant pleural effusion did not drain after DC home. She was admitted 7-7 thru 12-26-15 with left VATS drainage of loculated effusion by Dr Prescott Gum, pleurex replaced then. HH is following, pleurex draining <30 cc as of 01-01-16,that RN to speak with Dr Prescott Gum; patient's next apt with Dr Prescott Gum is 01-10-16.   Patient complains of SOB with exertion and feeling of tightness left lower chest, which she feels is related to pleurex dressing but seems more likely related to disease involvement lower left chest and lung. Little cough not productive. She feels too sedated with oxycodone. Appetite is not good but she is drinking Boost ~ once daily and I have encouraged her to increase this to ~ 3x daily, no nausea now and does have antiemetics available. No bleeding. Bowels moving some. No abdominal or pelvic pain. Is voiding. No problems with PAC. No fever or clear symptoms of infection.  Remainder of 10 point Review of Systems negative/ unchanged.   PAC in No genetics testing Feraheme on 12-29-15 and may have had IV iron also while in hospital for VATS  ONCOLOGIC HISTORY Patient had one episode of post menopausal vaginal bleeding early 2015. Later in 2015 she had persistent vaginal  discharge, for which she was seen by PCP Suzann Hedgecock in ~ 02-2014, had CTs in [facility] and was referred to Dr Adella Nissen, gyn in [facility]. Endometrial biopsy had concern (that path not included in information sent for consultation) such that she was referred to Dr Jacquelyne Balint. Surgery by Dr Sabra Heck at [facility] in [place] on 04-06-14 was TAH, BSO, omentectomy, pelvic and right common iliac node evaluation; patient was hospitalized x 3 days and tells me that she recovered well from the surgery. Pathology [phone] [id number]) from 04-06-14 found high grade serous carcinoma involving entire thickness of myometrium (depth of invasion 1.1 cm out of 1.1 cm), with invasion of cervical stroma, no involvement of uterine serosa/bilateral tubes and ovaries/omentum, positive LVSI, 2/5 right pelvic nodes, 2 of 4 left pelvic nodes and 0/1 right common iliac node. Bulk of the carcinoma was in lower uterine segment where it was within 0.1 cm of serosal surface. Cytology positive for adenocarcinoma on washings ([id number]). Surgical findings were significant for no paraaortic adenopathy apparent. Patient has received 3 cycles of adjuvant chemotherapy in [facility] by Dr Sabra Heck, on 05-05-14, 05-26-14 and 07-29-14. Per records and patient, delay of cycle 3 was related to low counts and insurance changes. She received neulasta after chemotherapy on 07-29-14. Taxol was given at 175 mg/m2 for total dose was 280 mg cycle 1 and 291 mg for cycles 2 and 3; carboplatin was AUC = 6 with total dose 610 mg cycle 1, 620 mg cycle 2 and 630 mg cycle 3.. She does not recall using oral decadron premed for taxol, with premeds listed on chemo flowsheets standard decadron 20 mg,  zofran 16 mg, benadryl 50 mg (which caused severe restless legs) and pepcid 20 mg. Outside information does not include serial blood counts. Last note from Dr Sabra Heck dated 07-29-14 describes unremarkable exam, "NED during chemotherapy and adequate PS". Patient was see by Dr Denman George  08-09-14, who recommends restaging scans after 6 cycles of chemotherapy and consideration of vaginal brachytherapy; she has not been seen by radiation oncology in [place]. CT AP done in [facility] system 08-15-14 (due to elevated LFTs and blood in urine just after transfer of care) with soft tissue fullness at superior abdominal aorta and question of retroperitoneal necrotic adenopathy; plan repeat scans after complete present chemotherapy. Cycles 4 -6 carbo taxol given 08-24-14 thru 10-21-14, with gCSF support (last cycle carboplatin only due to peripheral neuropathy in feet). CT AP 12-25-14 without adenopathy or other apparent residual or recurrent malignancy.  Radiation treatment dates: July 19, July 27, July 29, August 9, August 11 Site/dose: Proximal vagina, 30 gray in 5 fractions Patient was hospitalized at [facility] 6-15 thru 12-06-15 after presenting with progressive SOB, with finding of large left pleural effusion. CT chest 11-30-15 showed new enlarged and necrotic right hilar and mediastinal nodes, multiple right pulmonary masses, large left pleural effusion with pleural mass, tiny lucent foci too small to characterize in thoracic vertebrae. She had thoracentesis by pulmonary for 1.5 liters on 11-30-15, then left pleurex catheter placed by IR on 12-04-15 with additional 800 cc removed then. Cytology [id number] adenocarcinoma (appeared serous per pathologist verbal report). CT AP 12-01-15 showed no new or suspicious liver findings, 1.2 cm left periaortic node, large nodal mass left pelvic sidewall 6.2 x 2.9 cm, no ascites or peritoneal changes, no apparent bony findings.    Anemia: hemoglobin ranged from 8.1 - 12.6 thru chemo, with MCV 103 when I met her after first chemo and up to 109 during treatment. Iron studies 08-2014 had serum iron 43, %sat 22, ferritin 429, B12 >2000 SIEP 10-2014 had no monoclonal protein (elevated IgG and IgA polyclonal). Urinalyses had microscopic hematuria, with cystoscopy by Dr Burman Nieves at [facility] 10-20-2014 unremarkable. Haptoglobin 08-2014 not low (449), LDH normal, total bili occasionally elevated (2-29-26) but often normal. She was transfused 2 units PRBCs 09-2014 for hemoglobin 8.1.  Colonoscopy by Dr Oretha Caprice [facility] 05-23-15 unable to advance pediatric scope beyond sigmoid colon, which seemed to be fixed.    Objective:  Vital signs in last 24 hours:  BP 96/69 mmHg  Pulse 85  Temp(Src) 98.2 F (36.8 C) (Oral)  Resp 18  Ht 5' 6"  (1.676 m)  Wt 133 lb 14.4 oz (60.737 kg)  BMI 21.62 kg/m2  SpO2 98% Weight down 6 lbs from early July Alert, oriented and appropriate.In wheelchair, respirations not labored RA. Looks fatigued and rather cachectic, but not in acute discomfort. Aunt very supportive.  HEENT:PERRL, sclerae not icteric. Oral mucosa moist without lesions, posterior pharynx clear.  Neck supple. No JVD.  Lymphatics:no cervical,supraclavicular adenopathy Resp: Absent BS lower 1/3 left chest,  No wheezes or rales. Dressing clean and dry at left lower chest laterally, with occlusive dressing on top, none of this tight.  Cardio: regular rate and rhythm. No gallop. GI: soft, nontender, not distended, no mass or organomegaly. Normally active bowel sounds.  Musculoskeletal/ Extremities: without pitting edema, cords, tenderness. Little muscle mass. No swelling or cords LE  Neuro: no peripheral neuropathy. Otherwise nonfocal on exam done seated. PSYCH somewhat depressed affect, appropriate for situation Skin without rash, ecchymosis, petechiae Portacath-without erythema or tenderness  Lab Results:  Results for orders placed or performed in visit on 01/04/16  CBC with Differential  Result Value Ref Range   WBC 6.7 3.9 - 10.3 10e3/uL   NEUT# 4.9 1.5 - 6.5 10e3/uL   HGB 8.6 (L) 11.6 - 15.9 g/dL   HCT 27.5 (L) 34.8 - 46.6 %   Platelets 472 (H) 145 - 400 10e3/uL   MCV 93.2 79.5 - 101.0 fL   MCH 29.2 25.1 - 34.0 pg   MCHC 31.3 (L) 31.5 - 36.0 g/dL    RBC 2.95 (L) 3.70 - 5.45 10e6/uL   RDW 18.9 (H) 11.2 - 14.5 %   lymph# 1.2 0.9 - 3.3 10e3/uL   MONO# 0.6 0.1 - 0.9 10e3/uL   Eosinophils Absolute 0.0 0.0 - 0.5 10e3/uL   Basophils Absolute 0.0 0.0 - 0.1 10e3/uL   NEUT% 72.6 38.4 - 76.8 %   LYMPH% 17.6 14.0 - 49.7 %   MONO% 9.0 0.0 - 14.0 %   EOS% 0.4 0.0 - 7.0 %   BASO% 0.4 0.0 - 2.0 %   CMET 01-04-16 has AP 205, AST 47, ALT 60, Total protein 9.4, alb 2.1, calcium 9.8  24 hour urine ordered   Studies/Results:  Dg Chest 2 View  01/04/2016  CLINICAL DATA:  Shortness breath, 2-3 weeks duration. Endometrial cancer with malignant pleural effusion. Left PleurX catheter. EXAM: CHEST  2 VIEW COMPARISON:  12/26/2015.  12/23/2015. FINDINGS: Port-A-Cath is unchanged with the tip in the SVC above the right atrium. Left pleural catheter remains in place. There is probably a small amount of loculated pleural air anteriorly but no significant pneumothorax. Left pleural effusion is approximately the same size or perhaps minimally larger. Associated atelectasis of the left lower lung as expected. 1.4 cm pulmonary nodule in the right midlung appears the same. 4 cm mass at the right lung base appears the same. No developing effusion on the right. IMPRESSION: Left pleural catheter remains in place. Similar amount of pleural fluid. Less loculated pleural air. Persistent volume loss in the left lower lung. Pulmonary metastases appear unchanged since recent exams. Electronically Signed   By: Nelson Chimes M.D.   On: 01/04/2016 10:29  PACs images reviewed by MD.  Report routed to Dr Prescott Gum  Medications: I have reviewed the patient's current medications. She feels too sedated with oxycodone, some improvement with aleve .  Will try tramadol She does not use premedication decadron for taxol. She will have skin test prior to carboplatin given past treatment. She will have granix days 2,3,9,16 with this dose dense weekly regimen, as she required neulasta support  with previous q 3 week carbo taxol. Carbo for cycle 1 at AUC=5.   DISCUSSION Operative findings at VATS sclerosis reviewed, discussed present pleurex, CXR from this AM does not show increased effusion that I can tell. WIll update Dr Prescott Gum.  Pain medication and po intake as noted.  Chemo reviewed, hopefully the dose dense regimen will be tolerable and give some improvement in disease and related symptoms. Note cytopenias and taxol neuropathy previously.   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 Will confirm with path additional studies expected now early next week, but expect to begin dose dense carbo taxol also ~ late next week. Carbo skin testing, no premed decadron po for now.  2.malignant left pleural effusion as well as involvement left pleura and lungs:  post left VATS with sclerosis by Dr Prescott Gum 12-22-15, pleurex still in but draining very little, HH involved. CXR as above. Dr Prescott Gum following.  3.PAC in 4.iron deficiency anemia: she had feraheme on 7-14 and possibly another dose in hospital early July, which I will try to confirm as I cannot find that in EMR now. 5.elevated total protein and low albumin: SPEP previously no M spike. 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  8.no advance directives per EMR: need to address again 9. Long past tobacco: DCd 09-2015. Emphysematous changes on CT. Prn inhaler. [name] still glad to follow for other pulmonary issues - thank you 10. Microscopic hematuria: known to Dr Louis Meckel. Urine cytology negative  11.unable to visualize beyond  sigmoid on colonoscopy 12-16 due to stricture  12.history substance abuse including ETOH and marijuana 13. long history anxiety and depression 14.cytopenias with previous chemo: granix as above.   All questions answered. Chemo and granix orders confirmed. Patient knows to call if needed prior to next scheduled appointment. Time spent 30 min including >50% counseling and coordination of care. Route PCP, cc Drs Jacquelyne Balint and Prescott Gum   Evlyn Clines, MD   01/04/2016, 11:33 AM   ADDENDUM Verbal consent given for chemotherapy.  Surgical pathology from 12-22-15 KRYSTYL, CANNELL Collected: 12/22/2015 Client: [place] Accession: [id number] Received: 12/22/2015 Tharon Aquas [facility]igt[facility] OF SURGICAL PATHOLOGY FINAL DIAGNOSIS Diagnosis Pleura, peel, Left pleural peel - RARE MALIGNANT CELLS. - SEE MICROSCOPIC DESCRIPTION Microscopic Comment The specimen consists of abundant fibrin with rare minute aggregates of epithelial cells with malignant features consistent with poorly differentiated carcinoma. (JDP:gt, 12/26/15)

## 2016-01-05 ENCOUNTER — Other Ambulatory Visit: Payer: Self-pay | Admitting: Medical Oncology

## 2016-01-05 ENCOUNTER — Telehealth: Payer: Self-pay

## 2016-01-05 ENCOUNTER — Ambulatory Visit (HOSPITAL_BASED_OUTPATIENT_CLINIC_OR_DEPARTMENT_OTHER): Payer: Medicare Other

## 2016-01-05 VITALS — BP 93/65 | HR 90 | Temp 98.5°F | Resp 18

## 2016-01-05 DIAGNOSIS — C541 Malignant neoplasm of endometrium: Secondary | ICD-10-CM

## 2016-01-05 MED ORDER — TBO-FILGRASTIM 300 MCG/0.5ML ~~LOC~~ SOSY
300.0000 ug | PREFILLED_SYRINGE | Freq: Once | SUBCUTANEOUS | Status: AC
Start: 2016-01-05 — End: 2016-01-05
  Administered 2016-01-05: 300 ug via SUBCUTANEOUS
  Filled 2016-01-05: qty 0.5

## 2016-01-05 NOTE — Telephone Encounter (Signed)
Sheena Caldwell calling to see if she needs to come for the granix injection tomorrow as well as today. Told her that she does need to come.  Theses injections will help keep her WBC up so she can receive her chemotherapy weekly as scheduled.  Sheena Sinyard verbalized understanding.

## 2016-01-06 ENCOUNTER — Ambulatory Visit (HOSPITAL_BASED_OUTPATIENT_CLINIC_OR_DEPARTMENT_OTHER): Payer: Medicare Other

## 2016-01-06 ENCOUNTER — Other Ambulatory Visit: Payer: Self-pay | Admitting: Oncology

## 2016-01-06 VITALS — BP 92/63 | HR 97 | Temp 97.8°F | Resp 18

## 2016-01-06 DIAGNOSIS — C541 Malignant neoplasm of endometrium: Secondary | ICD-10-CM

## 2016-01-06 DIAGNOSIS — G893 Neoplasm related pain (acute) (chronic): Secondary | ICD-10-CM | POA: Insufficient documentation

## 2016-01-06 MED ORDER — TBO-FILGRASTIM 300 MCG/0.5ML ~~LOC~~ SOSY
300.0000 ug | PREFILLED_SYRINGE | Freq: Once | SUBCUTANEOUS | Status: AC
  Administered 2016-01-06: 300 ug via SUBCUTANEOUS

## 2016-01-09 ENCOUNTER — Other Ambulatory Visit: Payer: Self-pay | Admitting: Cardiothoracic Surgery

## 2016-01-09 ENCOUNTER — Encounter: Payer: Self-pay | Admitting: Oncology

## 2016-01-09 DIAGNOSIS — J910 Malignant pleural effusion: Secondary | ICD-10-CM

## 2016-01-09 NOTE — Progress Notes (Signed)
[employer] form left in my box for tramadol prior auth. I left for dr Marko Plume to sign

## 2016-01-10 ENCOUNTER — Ambulatory Visit
Admission: RE | Admit: 2016-01-10 | Discharge: 2016-01-10 | Disposition: A | Discharge status: Home / Self Care | Payer: Medicare Other | Source: Ambulatory Visit | Attending: Cardiothoracic Surgery | Admitting: Cardiothoracic Surgery

## 2016-01-10 ENCOUNTER — Telehealth: Payer: Self-pay | Admitting: *Deleted

## 2016-01-10 ENCOUNTER — Other Ambulatory Visit: Payer: Self-pay | Admitting: *Deleted

## 2016-01-10 ENCOUNTER — Ambulatory Visit (INDEPENDENT_AMBULATORY_CARE_PROVIDER_SITE_OTHER): Payer: Self-pay | Admitting: Cardiothoracic Surgery

## 2016-01-10 ENCOUNTER — Encounter: Payer: Self-pay | Admitting: Oncology

## 2016-01-10 ENCOUNTER — Encounter: Payer: Self-pay | Admitting: Cardiothoracic Surgery

## 2016-01-10 VITALS — BP 91/68 | HR 100 | Resp 20 | Ht 66.0 in | Wt 133.0 lb

## 2016-01-10 DIAGNOSIS — J90 Pleural effusion, not elsewhere classified: Secondary | ICD-10-CM

## 2016-01-10 DIAGNOSIS — J910 Malignant pleural effusion: Secondary | ICD-10-CM

## 2016-01-10 DIAGNOSIS — R918 Other nonspecific abnormal finding of lung field: Secondary | ICD-10-CM

## 2016-01-10 DIAGNOSIS — C541 Malignant neoplasm of endometrium: Secondary | ICD-10-CM

## 2016-01-10 DIAGNOSIS — J948 Other specified pleural conditions: Secondary | ICD-10-CM

## 2016-01-10 NOTE — Telephone Encounter (Signed)
Pt called, tearful. States she does not feel like she can take chemo tomorrow. "I feel too weak, just want to rest". Has been drinking Boost 3 per day, and ~  4 glasses of water. Denies N/V.  RN spoke with Dr Marko Plume- will cancel all appts tomorrow. Will keep appts on 8/3

## 2016-01-10 NOTE — H&P (Signed)
PCP is HEDGECOCK,SUZANNE, PA-C Referring Provider is Ardith Dark, [place]  Chief Complaint  Patient presents with  . Routine Post Op    f/u from surgery with CXR s/p Lt VATS,placement of PleurX catheter 12/22/15    HPI: First postop visit after left VATS for drainage of malignant effusion with pleurodesis and Pleurx catheter placement. Patient has metastatic endometrial cancer. The Pleurx catheter has not drained any significant amount since placement. Chest x-ray today shows some pleural thickening, parenchymal lung tumor, and minimal pleural effusion. She is receiving chemotherapy from oncology.  The patient has expected post op thoracotomy pain. She is weak. Her appetite is poor. The patient is taking tramadol as needed for pain. She does not wish to use narcotics.  We will plan on Pleurx catheter removal at the outpatient area at [facility] on Monday.  Past Medical History:  Diagnosis Date  . Anemia   . Arthritis   . Blood transfusion without reported diagnosis    2015, 2016  . Constipation   . COPD (chronic obstructive pulmonary disease) ([place])   . Radiation 01/03/15, 01/11/15, 01/13/15, 01/24/15, 01/26/15   proximal vagina 30 gray  . Uterine cancer [facility])    finished chemo May 2016, to start radiation July 2016    Past Surgical History:  Procedure Laterality Date  . CESAREAN SECTION  1991  . CHEST TUBE INSERTION Left 12/22/2015   Procedure: INSERTION PLEURAL DRAINAGE CATHETER;  Surgeon: Ivin Poot, MD;  Location: [facility];  Service: Thoracic;  Laterality: Left;  . COLONOSCOPY    . INCISE AND DRAIN ABCESS  1970   scalp  . PLEURADESIS Left 12/22/2015   Procedure: PLEURADESIS;  Surgeon: Ivin Poot, MD;  Location: [place];  Service: Thoracic;  Laterality: Left;  . PLEURAL EFFUSION DRAINAGE Left 12/22/2015   Procedure: DRAINAGE OF PLEURAL EFFUSION;  Surgeon: Ivin Poot, MD;  Location: [facility];  Service: Thoracic;  Laterality: Left;  . PORTACATH PLACEMENT  04/26/2014   rt. power port with tip in SVC  . TOTAL ABDOMINAL HYSTERECTOMY W/ BILATERAL SALPINGOOPHORECTOMY  04/06/14   TAH, BSO, omentectomy, pelvic and right common iliac node evaluation  . VIDEO ASSISTED THORACOSCOPY Left 12/22/2015   Procedure: VIDEO ASSISTED THORACOSCOPY;  Surgeon: Ivin Poot, MD;  Location: [facility] OR;  Service: Thoracic;  Laterality: Left;    Family History  Problem Relation Age of Onset  . Hypertension Mother   . Cancer Father   . Colon cancer Neg Hx   . Esophageal cancer Neg Hx   . Rectal cancer Neg Hx   . Stomach cancer Neg Hx     Social History Social History  Substance Use Topics  . Smoking status: Former Smoker    Packs/day: 1.00    Years: 30.00    Types: Cigarettes    Quit date: 07/16/2013  . Smokeless tobacco: Never Used  . Alcohol use 1.2 - 1.8 oz/week    2 - 3 Glasses of wine per week     Comment: none now    Current Outpatient Prescriptions  Medication Sig Dispense Refill  . albuterol (PROVENTIL HFA;VENTOLIN HFA) 108 (90 Base) MCG/ACT inhaler Inhale 1 puff into the lungs every 6 (six) hours as needed for wheezing or shortness of breath.    . Calcium Citrate-Vitamin D (CALCIUM + D PO) Take 1 tablet by mouth daily.    . cholecalciferol (VITAMIN D) 1000 units tablet Take 1,000 Units by mouth daily.    . ferrous sulfate 325 (65 FE) MG tablet  Take 325 mg by mouth daily.    . fludrocortisone (FLORINEF) 0.1 MG tablet Take 0.05 mg by mouth daily.    . Multiple Vitamin (MULTI-VITAMINS) TABS Take 1 tablet by mouth daily.    . naproxen sodium (ANAPROX) 220 MG tablet Take 220 mg by mouth 2 (two) times daily as needed.    . polyethylene glycol (MIRALAX / GLYCOLAX) packet Take 17 g by mouth daily as needed for mild constipation.     . promethazine (PHENERGAN) 12.5 MG tablet Take 1 tablet (12.5 mg total) by mouth every 6 (six) hours as needed for nausea or vomiting. 30 tablet 0  . traMADol (ULTRAM) 50 MG tablet Take 1 tablet (50 mg total) by mouth every 6 (six) hours as  needed. 30 tablet 0  . UNABLE TO FIND Med Name: smooth move drink for constipation     No current facility-administered medications for this visit.     Allergies  Allergen Reactions  . Levaquin [Levofloxacin In D5w] Other (See Comments)    "I just felt horrible"    Review of Systems  Doing poorly at home-does not feel she is strong enough for more chemotherapy Home nurse has been changing Pleurx catheter site  BP 91/68 (BP Location: Right Arm, Patient Position: Sitting, Cuff Size: Normal)   Pulse 100   Resp 20   Ht 5\' 6"  (1.676 m)   Wt 133 lb (60.3 kg)   SpO2 92% Comment: RA  BMI 21.47 kg/m  Physical Exam Chronically ill, cachectic, no distress Slightly diminished breath sounds at left base Heart rhythm regular Neuro intact  Diagnostic Tests: Chest x-ray with improved aeration after left VATS and drainage loculated malignant effusion  Impression: No drainage from Pleurx catheter. We'll have catheter removed at short stay at [facility] hospital Monday, July 31.  Plan: Patient return with chest x-ray in 4-6 weeks for follow-up and monitoring of her pleural disease.   Len Childs, MD [facility] [phone]

## 2016-01-10 NOTE — Progress Notes (Signed)
[employer] form left in my box for tramadol prior auth. I left for dr Marko Plume to sign- per [place] no auth needed I will fax copy to [facility] and send to medical recrds

## 2016-01-11 ENCOUNTER — Telehealth: Payer: Self-pay

## 2016-01-11 ENCOUNTER — Ambulatory Visit: Payer: Medicaid Other

## 2016-01-11 ENCOUNTER — Other Ambulatory Visit: Payer: Medicaid Other

## 2016-01-11 ENCOUNTER — Encounter: Payer: Medicare Other | Admitting: Nutrition

## 2016-01-11 NOTE — Telephone Encounter (Signed)
She feels about the same as yesterday. Tired and weak and SOB.  Denies fever. She states that she feels it is from where Dr Prescott Gum cut her back last month and not so much from the pleurex tube. Dr Nils Pyle plans to remove pleurex tube Monday. Pt continues to drink 4 or more glasses of water per day. Instructed her to call if needed.

## 2016-01-11 NOTE — Telephone Encounter (Signed)
-----   Message from Gordy Levan, MD sent at 01/11/2016  8:11 AM EDT ----- Patient spoke with RN on 01-10-16, feeling badly and cancelled chemo 7-27. Please check on her by phone today thanks

## 2016-01-12 ENCOUNTER — Ambulatory Visit: Payer: Medicaid Other

## 2016-01-15 ENCOUNTER — Encounter (HOSPITAL_COMMUNITY)
Admission: RE | Disposition: A | Discharge status: Home / Self Care | Payer: Self-pay | Source: Ambulatory Visit | Attending: Cardiothoracic Surgery

## 2016-01-15 ENCOUNTER — Ambulatory Visit (HOSPITAL_COMMUNITY)
Admission: RE | Admit: 2016-01-15 | Discharge: 2016-01-15 | Disposition: A | Discharge status: Home / Self Care | Payer: Medicare Other | Source: Ambulatory Visit | Attending: Cardiothoracic Surgery | Admitting: Cardiothoracic Surgery

## 2016-01-15 DIAGNOSIS — J948 Other specified pleural conditions: Secondary | ICD-10-CM | POA: Insufficient documentation

## 2016-01-15 DIAGNOSIS — J90 Pleural effusion, not elsewhere classified: Secondary | ICD-10-CM

## 2016-01-15 SURGERY — REMOVAL, CLOSED DRAINAGE CATHETER SYSTEM, PLEURAL
Anesthesia: Monitor Anesthesia Care | Laterality: Left

## 2016-01-15 MED ORDER — LIDOCAINE HCL (PF) 1 % IJ SOLN
INTRAMUSCULAR | Status: AC
  Filled 2016-01-15: qty 30

## 2016-01-15 NOTE — Procedures (Signed)
      [address]       [place] [address]             [phone]     BRIEF PROCEDURE NOTE:   Left Pleurx catheter removed per usual technique  Betadine prep  10 cc 1% lidocaine local anest  Catheter removed intact  Patient tolerated the procedure well     GOLD,WAYNE E, PA-C

## 2016-01-17 ENCOUNTER — Other Ambulatory Visit: Payer: Self-pay | Admitting: Oncology

## 2016-01-18 ENCOUNTER — Other Ambulatory Visit (HOSPITAL_BASED_OUTPATIENT_CLINIC_OR_DEPARTMENT_OTHER): Payer: Medicare Other

## 2016-01-18 ENCOUNTER — Ambulatory Visit: Payer: Medicare Other | Admitting: Nutrition

## 2016-01-18 ENCOUNTER — Ambulatory Visit (HOSPITAL_COMMUNITY)
Admission: RE | Admit: 2016-01-18 | Discharge: 2016-01-18 | Disposition: A | Discharge status: Home / Self Care | Payer: Medicare Other | Source: Ambulatory Visit | Attending: Oncology | Admitting: Oncology

## 2016-01-18 ENCOUNTER — Ambulatory Visit (HOSPITAL_BASED_OUTPATIENT_CLINIC_OR_DEPARTMENT_OTHER): Payer: Medicare Other | Admitting: Oncology

## 2016-01-18 ENCOUNTER — Ambulatory Visit: Payer: Medicare Other

## 2016-01-18 ENCOUNTER — Ambulatory Visit (HOSPITAL_BASED_OUTPATIENT_CLINIC_OR_DEPARTMENT_OTHER): Payer: Medicare Other

## 2016-01-18 ENCOUNTER — Telehealth: Payer: Self-pay | Admitting: Oncology

## 2016-01-18 ENCOUNTER — Encounter: Payer: Self-pay | Admitting: Oncology

## 2016-01-18 VITALS — BP 86/64 | HR 83 | Temp 98.0°F | Resp 18 | Ht 66.0 in | Wt 133.9 lb

## 2016-01-18 DIAGNOSIS — D500 Iron deficiency anemia secondary to blood loss (chronic): Secondary | ICD-10-CM

## 2016-01-18 DIAGNOSIS — J910 Malignant pleural effusion: Secondary | ICD-10-CM

## 2016-01-18 DIAGNOSIS — D509 Iron deficiency anemia, unspecified: Secondary | ICD-10-CM | POA: Diagnosis not present

## 2016-01-18 DIAGNOSIS — G620 Drug-induced polyneuropathy: Secondary | ICD-10-CM

## 2016-01-18 DIAGNOSIS — D649 Anemia, unspecified: Secondary | ICD-10-CM | POA: Diagnosis not present

## 2016-01-18 DIAGNOSIS — C7800 Secondary malignant neoplasm of unspecified lung: Secondary | ICD-10-CM | POA: Diagnosis not present

## 2016-01-18 DIAGNOSIS — C541 Malignant neoplasm of endometrium: Secondary | ICD-10-CM | POA: Diagnosis present

## 2016-01-18 DIAGNOSIS — Z66 Do not resuscitate: Secondary | ICD-10-CM

## 2016-01-18 DIAGNOSIS — T451X5A Adverse effect of antineoplastic and immunosuppressive drugs, initial encounter: Secondary | ICD-10-CM

## 2016-01-18 DIAGNOSIS — Z95828 Presence of other vascular implants and grafts: Secondary | ICD-10-CM

## 2016-01-18 LAB — CBC WITH DIFFERENTIAL/PLATELET
BASO%: 0.9 % (ref 0.0–2.0)
BASOS ABS: 0 10*3/uL (ref 0.0–0.1)
EOS ABS: 0 10*3/uL (ref 0.0–0.5)
EOS%: 0.3 % (ref 0.0–7.0)
HEMATOCRIT: 24.8 % — AB (ref 34.8–46.6)
HEMOGLOBIN: 7.7 g/dL — AB (ref 11.6–15.9)
LYMPH%: 32.4 % (ref 14.0–49.7)
MCH: 28.9 pg (ref 25.1–34.0)
MCHC: 31 g/dL — ABNORMAL LOW (ref 31.5–36.0)
MCV: 93.2 fL (ref 79.5–101.0)
MONO#: 0.5 10*3/uL (ref 0.1–0.9)
MONO%: 15.1 % — ABNORMAL HIGH (ref 0.0–14.0)
NEUT%: 51.3 % (ref 38.4–76.8)
NEUTROS ABS: 1.7 10*3/uL (ref 1.5–6.5)
PLATELETS: 384 10*3/uL (ref 145–400)
RBC: 2.66 10*6/uL — ABNORMAL LOW (ref 3.70–5.45)
RDW: 19.4 % — AB (ref 11.2–14.5)
WBC: 3.2 10*3/uL — AB (ref 3.9–10.3)
lymph#: 1.1 10*3/uL (ref 0.9–3.3)

## 2016-01-18 LAB — COMPREHENSIVE METABOLIC PANEL
ALBUMIN: 2.1 g/dL — AB (ref 3.5–5.0)
ALK PHOS: 227 U/L — AB (ref 40–150)
ALT: 39 U/L (ref 0–55)
ANION GAP: 10 meq/L (ref 3–11)
AST: 26 U/L (ref 5–34)
BUN: 9.4 mg/dL (ref 7.0–26.0)
CALCIUM: 9.6 mg/dL (ref 8.4–10.4)
CO2: 26 mEq/L (ref 22–29)
Chloride: 102 mEq/L (ref 98–109)
Creatinine: 0.7 mg/dL (ref 0.6–1.1)
GLUCOSE: 118 mg/dL (ref 70–140)
Potassium: 3.9 mEq/L (ref 3.5–5.1)
SODIUM: 138 meq/L (ref 136–145)
TOTAL PROTEIN: 9.1 g/dL — AB (ref 6.4–8.3)

## 2016-01-18 LAB — PREPARE RBC (CROSSMATCH)

## 2016-01-18 MED ORDER — SODIUM CHLORIDE 0.9% FLUSH
10.0000 mL | INTRAVENOUS | Status: AC | PRN
  Administered 2016-01-18: 10 mL
  Filled 2016-01-18: qty 10

## 2016-01-18 MED ORDER — HEPARIN SOD (PORK) LOCK FLUSH 100 UNIT/ML IV SOLN
500.0000 [IU] | Freq: Every day | INTRAVENOUS | Status: AC | PRN
  Administered 2016-01-18: 500 [IU]
  Filled 2016-01-18: qty 5

## 2016-01-18 MED ORDER — ACETAMINOPHEN 325 MG PO TABS
ORAL_TABLET | ORAL | Status: AC
  Filled 2016-01-18: qty 1

## 2016-01-18 MED ORDER — ACETAMINOPHEN 325 MG PO TABS
325.0000 mg | ORAL_TABLET | Freq: Once | ORAL | Status: AC
  Administered 2016-01-18: 325 mg via ORAL

## 2016-01-18 MED ORDER — SODIUM CHLORIDE 0.9% FLUSH
10.0000 mL | INTRAVENOUS | Status: DC | PRN
  Administered 2016-01-18: 10 mL via INTRAVENOUS
  Filled 2016-01-18: qty 10

## 2016-01-18 MED ORDER — SODIUM CHLORIDE 0.9 % IV SOLN
250.0000 mL | Freq: Once | INTRAVENOUS | Status: AC
  Administered 2016-01-18: 250 mL via INTRAVENOUS

## 2016-01-18 NOTE — Progress Notes (Signed)
Patient was identified to be at risk for malnutrition on the MST secondary to poor appetite and weight loss.  65 year old female diagnosed with metastatic endometrial cancer.  She is a patient of Dr. Marko Plume.  Past medical history includes substance abuse, anxiety, depression, COPD, and constipation.  Medications include calcium with vitamin D, ferrous sulfate, multivitamin, MiraLAX, and Phenergan.  Labs include albumin 2.1 on July 20  Height: 66 inches. Weight: 133.9 pounds. Usual body weight: 146 pounds June 16. BMI: 21.62.  Patient reports poor appetite and limited oral intake. Reports history of nausea and vomiting but that is improved. She also reports history of constipation.  She now takes MiraLAX daily.  This is resolved. Patient reports she drinks boost some days when she doesn't eat well.  Nutrition diagnosis:  Inadequate oral intake related to metastatic cancer and associated treatments as evidenced by 12 pound weight loss over 2 months.  Intervention: Educated patient to consume high-calorie, high-protein foods in 3 meals and 3 snacks daily. Encouraged patient to continue to drink boost plus once or twice daily.  Provided coupons. Encouraged patient to continue to take medication for constipation. Provided fact sheets on increasing calories and protein. Questions were answered.  Teach back method used.  Contact information was given.  Monitoring, evaluation, goals:  Patient will tolerate adequate calories and protein to minimize further weight loss.  Next visit: Thursday, August 17, during infusion.  **Disclaimer: This note was dictated with voice recognition software. Similar sounding words can inadvertently be transcribed and this note may contain transcription errors which may not have been corrected upon publication of note.**

## 2016-01-18 NOTE — Progress Notes (Signed)
Granix not given per MD's order

## 2016-01-18 NOTE — Patient Instructions (Signed)

## 2016-01-18 NOTE — Progress Notes (Signed)
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 She had left VATS by Dr Prescott Gum 12-23-2015. She resumed chemotherapy with day 1 cycle 1 carbo taxol on 01-04-16.   Patient cancelled day 8 cycle 1 taxol on 01-11-16 as she was tired, weak, SOB. As pleurex was not draining and no longer needed, this was removed 01-15-16. Home Health is no longer following since pleurex out. Patient reports SOB no worse, but left lower chest tightness ongoing, this likely related to underlying malignant involvement in left lower chest and post thoracotomy pain. She is using 1/2 tramadol at hs only, declines other pain medication as she feels this makes her too sedated. She is generally weak. She denies nausea, is drinking ~2 Boost daily if not eating. Bowels are moving with miralax in OJ daily. She has had no bleeding, no fever, using cane when walks. No problems with PAC. No LE swelling. No increased peripheral neuropathy. Remainder of 10 point Review of Systems negative.     PAC in F[place]me on 12-29-15, ? had IV iron also while in hospital for VATS   ONCOLOGIC HISTORY Patient had one episode of post menopausal vaginal bleeding early 2015. Later in 2015 she had persistent vaginal discharge, for which she was seen by PCP Suzann Hedgecock in ~ 02-2014, had CTs in [facility] and was referred to Dr Adella Nissen, gyn in [facility]. Endometrial biopsy had concern (that  path not included in information sent for consultation) such that she was referred to Dr Jacquelyne Balint. Surgery by Dr Sabra Heck at [facility] in [place] on 04-06-14 was TAH, BSO, omentectomy, pelvic and right common iliac node evaluation; patient was hospitalized x 3 days and tells me that she recovered well from the surgery. Pathology [id number]) from 04-06-14 found high grade serous carcinoma involving entire thickness of myometrium (depth of invasion 1.1 cm out of 1.1 cm), with invasion of cervical stroma, no involvement of uterine serosa/bilateral tubes and ovaries/omentum, positive LVSI, 2/5 right pelvic nodes, 2 of 4 left pelvic nodes and 0/1 right common iliac node. Bulk of the carcinoma was in lower uterine segment where it was within 0.1 cm of serosal surface. Cytology positive for adenocarcinoma on washings ([id number]). Surgical findings were significant for no paraaortic adenopathy apparent. Patient has received 3 cycles of adjuvant chemotherapy in [facility] by Dr Sabra Heck, on 05-05-14, 05-26-14 and 07-29-14. Per records and patient, delay of cycle 3 was related to low counts and insurance changes. She received neulasta after chemotherapy on 07-29-14. Taxol was given at 175 mg/m2 for total dose was 280 mg cycle 1 and 291 mg for cycles 2 and 3; carboplatin was AUC = 6 with total dose 610 mg cycle 1, 620 mg cycle 2 and 630 mg cycle 3.. She does not recall using oral decadron premed for taxol, with premeds listed on chemo flowsheets standard decadron 20 mg, zofran 16 mg, benadryl 50 mg (which caused severe restless legs) and pepcid 20 mg. Outside information does not include serial blood counts. Last note  from Dr Sabra Heck dated 07-29-14 describes unremarkable exam, "NED during chemotherapy and adequate PS". Patient was see by Dr Denman George 08-09-14, who recommends restaging scans after 6 cycles of chemotherapy and consideration of vaginal brachytherapy; she has not been seen by radiation oncology in [place]. CT AP done  in [facility] 08-15-14 (due to elevated LFTs and blood in urine just after transfer of care) with soft tissue fullness at superior abdominal aorta and question of retroperitoneal necrotic adenopathy; plan repeat scans after complete present chemotherapy. Cycles 4 -6 carbo taxol given 08-24-14 thru 10-21-14, with gCSF support (last cycle carboplatin only due to peripheral neuropathy in feet). CT AP 12-25-14 without adenopathy or other apparent residual or recurrent malignancy.  Radiation treatment dates: July 19, July 27, July 29, August 9, August 11 Site/dose: Proximal vagina, 30 gray in 5 fractions Patient was hospitalized at [facility] 6-15 thru 12-06-15 after presenting with progressive SOB, with finding of large left pleural effusion. CT chest 11-30-15 showed new enlarged and necrotic right hilar and mediastinal nodes, multiple right pulmonary masses, large left pleural effusion with pleural mass, tiny lucent foci too small to characterize in thoracic vertebrae. She had thoracentesis by pulmonary for 1.5 liters on 11-30-15, then left pleurex catheter placed by IR on 12-04-15 with additional 800 cc removed then. Cytology [id number] adenocarcinoma (appeared serous per pathologist verbal report). CT AP 12-01-15 showed no new or suspicious liver findings, 1.2 cm left periaortic node, large nodal mass left pelvic sidewall 6.2 x 2.9 cm, no ascites or peritoneal changes, no apparent bony findings. She had day 1 cycle 1 dose dense carbo taxol on 01-04-16.    Anemia: hemoglobin ranged from 8.1 - 12.6 thru chemo, with MCV 103 when I met her after first chemo and up to 109 during treatment. Iron studies 08-2014 had serum iron 43, %sat 22, ferritin 429, B12 >2000 SIEP 10-2014 had no monoclonal protein (elevated IgG and IgA polyclonal). Urinalyses had microscopic hematuria, with cystoscopy by Dr Burman Nieves at [facility] 10-20-2014 unremarkable. Haptoglobin 08-2014 not low (449), LDH normal, total bili occasionally  elevated (2-29-26) but often normal. She was transfused 2 units PRBCs 09-2014 for hemoglobin 8.1.  Colonoscopy by Dr Oretha [facility] 05-23-15 unable to advance pediatric scope beyond sigmoid colon, which seemed to be fixed..  Objective:  Vital signs in last 24 hours:  BP (!) 86/64 (BP Location: Left Arm, Patient Position: Sitting)   Pulse 83   Temp 98 F (36.7 C) (Oral)   Resp 18   Ht 5' 6"  (1.676 m)   Wt 133 lb 14.4 oz (60.7 kg)   SpO2 99%   BMI 21.61 kg/m  Weight stable. Looks cachectic and weak, respirations not labored RA, not in acute discomfort Alert, oriented and appropriate. Using WC for office.  No alopecia  HEENT:PERRL, sclerae not icteric. Oral mucosa moist without lesions, posterior pharynx clear.  Neck supple. No JVD.  Lymphatics:no cervical,supraclavicular adenopathy Resp: diminished BS lower 1/3 left and right base, no wheezes or rales. No use of accessory muscles. Site of previous pleurex closed, no erythema Cardio: regular rate and rhythm. No gallop. GI: soft, nontender, not distended, no mass or organomegaly. Few bowel sounds. Musculoskeletal/ Extremities: LE without pitting edema, cords, tenderness. Muscle wasting. Neuro: no peripheral neuropathy. Otherwise nonfocal Skin without rash, ecchymosis, petechiae Portacath-without erythema or tenderness  Lab Results:  Results for orders placed or performed in visit on 01/18/16  CBC with Differential  Result Value Ref Range   WBC 3.2 (L) 3.9 -  10.3 10e3/uL   NEUT# 1.7 1.5 - 6.5 10e3/uL   HGB 7.7 (L) 11.6 - 15.9 g/dL   HCT 24.8 (L) 34.8 - 46.6 %   Platelets 384 145 - 400 10e3/uL   MCV 93.2 79.5 - 101.0 fL   MCH 28.9 25.1 - 34.0 pg   MCHC 31.0 (L) 31.5 - 36.0 g/dL   RBC 2.66 (L) 3.70 - 5.45 10e6/uL   RDW 19.4 (H) 11.2 - 14.5 %   lymph# 1.1 0.9 - 3.3 10e3/uL   MONO# 0.5 0.1 - 0.9 10e3/uL   Eosinophils Absolute 0.0 0.0 - 0.5 10e3/uL   Basophils Absolute 0.0 0.0 - 0.1 10e3/uL   NEUT% 51.3 38.4 - 76.8 %    LYMPH% 32.4 14.0 - 49.7 %   MONO% 15.1 (H) 0.0 - 14.0 %   EOS% 0.3 0.0 - 7.0 %   BASO% 0.9 0.0 - 2.0 %  Comprehensive metabolic panel  Result Value Ref Range   Sodium 138 136 - 145 mEq/L   Potassium 3.9 3.5 - 5.1 mEq/L   Chloride 102 98 - 109 mEq/L   CO2 26 22 - 29 mEq/L   Glucose 118 70 - 140 mg/dl   BUN 9.4 7.0 - 26.0 mg/dL   Creatinine 0.7 0.6 - 1.1 mg/dL   Total Bilirubin <0.30 0.20 - 1.20 mg/dL   Alkaline Phosphatase 227 (H) 40 - 150 U/L   AST 26 5 - 34 U/L   ALT 39 0 - 55 U/L   Total Protein 9.1 (H) 6.4 - 8.3 g/dL   Albumin 2.1 (L) 3.5 - 5.0 g/dL   Calcium 9.6 8.4 - 10.4 mg/dL   Anion Gap 10 3 - 11 mEq/L   EGFR >90 >90 ml/min/1.73 m2     Studies/Results: DG Chest 2 View (Accession [id number]) (Order [id number])  Imaging  Date: 01/10/2016 Department: [facility] [place] Released By: Shelda Pal Authorizing: Ivin Poot, MD  PACS Images   Show images for DG Chest 2 View  Study Result   CLINICAL DATA:  Followup from VATS for drainage of malignant pleural effusion and insertion of PleurX catheter EXAM: CHEST  2 VIEW COMPARISON:  Chest x-ray of 01/04/2016 FINDINGS: A PleurX catheter overlies the left lung base. Aeration of the left lung base may for improved slightly. However there is little change in pleural and parenchymal opacities at the left lung base. There appear to be new pulmonary nodules throughout the right lung consistent with progressive metastatic disease. Mild cardiomegaly is stable. Right-sided Port-A-Cath tip overlies the lower SVC. IMPRESSION: 1. Little change in pleural and parenchymal opacity at the left lung base with PleurX catheter remaining. 2. Increase in size and number of pulmonary nodules particularly on the right consistent with progressive metastatic disease.   PACs images reviewed  Medications: I have reviewed the patient's current medications. She does not use premedication decadron for  taxol  DISCUSSION Interval history reviewed.   Discussed left lower chest discomfort, related to underlying malignancy and the VATS. She understands that she can use tramadol also during day if she needs. Discussed fact that this metastatic endometrial cancer is not potentially curable and that treatment is in attempt to improve disease and associated symptoms, and control this as best possible. She understands that this objective is different than that of adjuvant therapy.  In situation where cure is not possible, discussed that quality of life is also particularly important.   Discussed HCPOA and advance directives, which she has not completed tho  she tells me that her daughter has been encouraging her to do so. We have discussed usefulness of resuscitation and life support when the underlying problem can be resolved, but that those interventions do not improve an underlying advanced malignancy. She tells me that she does not want resuscitation or life support.  Written information given for advance directives and message to [facility] SW to follow up.  Patient is more symptomatic from anemia, hemoglobin down to 7.7 today. She agrees to PRBCs, which can be given 1 unit today (instead of planned day 15 taxol) and 1 unit on 01-19-16. We will reschedule the taxol to next week.     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 Treatment in attempt to palliate and control disease. Day 1 cycle 1 carbo taxol 01-04-16, days 8 and 15 not given so far.   2.malignant left pleural effusion as well as involvement left pleura and lungs: post left VATS with sclerosis by Dr Prescott Gum 12-22-15, pleurex removed. Appreciate Dr Lucianne Lei Trigt's help with this. 3.DNR patient's request. SW to assist in completing HCPOA and rest of formal advance directives.   4.iron deficiency anemia: she had feraheme on 7-14 and possibly another dose in hospital early July 5.elevated total protein and low albumin: SPEP previously no M spike. 24 hr urine 09-12-15 no M spike. 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  8.PAC in 9. Long past tobacco: DCd 09-2015. Emphysematous changes on CT. Known to [name]  10. Microscopic hematuria: known to Dr Louis Meckel. Urine cytology negative  11.unable to visualize beyond sigmoid on colonoscopy 12-16 due to stricture  12.history substance abuse including ETOH and marijuana 13. long history anxiety and depression 14.cytopenias with previous chemo: granix as above. Symptomatic anemia now, hgb 7.7. Transfuse 2 units PRBCs, orders placed.   All questions answered and patient is in agreement with plans as above. PRBC orders placed, chemo adjusted. Time spent 25 min including >50% counseling and coordination of care. Route PCP, cc Dr Jacinto Reap. Kathe Becton, MD   01/18/2016, 8:57 PM

## 2016-01-18 NOTE — Patient Instructions (Signed)

## 2016-01-18 NOTE — Telephone Encounter (Signed)
Per pof to sch pt appt-pt to get updated copy b4 leaving trmt room

## 2016-01-18 NOTE — Progress Notes (Signed)
Per Dr. Marko Plume, no treatment today pt to receive two units of PRBCs, no need for Granix injection. Pt to receive one unit today and one unit 01/19/16. Pt aware and verbalizes understanding.  1810: Pt and VS stable at time of discharge, Port remains accessed per Pt request, pt will be seen in clinic 01/19/16. Pt educated to keep blue band on for appt on 01/19/16, pt verbalizes understanding.

## 2016-01-19 ENCOUNTER — Ambulatory Visit (HOSPITAL_BASED_OUTPATIENT_CLINIC_OR_DEPARTMENT_OTHER): Payer: Medicare Other

## 2016-01-19 ENCOUNTER — Encounter: Payer: Self-pay | Admitting: *Deleted

## 2016-01-19 DIAGNOSIS — C541 Malignant neoplasm of endometrium: Secondary | ICD-10-CM | POA: Diagnosis not present

## 2016-01-19 DIAGNOSIS — D649 Anemia, unspecified: Secondary | ICD-10-CM

## 2016-01-19 MED ORDER — SODIUM CHLORIDE 0.9 % IV SOLN
250.0000 mL | Freq: Once | INTRAVENOUS | Status: AC
  Administered 2016-01-19: 250 mL via INTRAVENOUS

## 2016-01-19 MED ORDER — HEPARIN SOD (PORK) LOCK FLUSH 100 UNIT/ML IV SOLN
500.0000 [IU] | Freq: Every day | INTRAVENOUS | Status: AC | PRN
  Administered 2016-01-19: 500 [IU]
  Filled 2016-01-19: qty 5

## 2016-01-19 MED ORDER — SODIUM CHLORIDE 0.9% FLUSH
10.0000 mL | INTRAVENOUS | Status: AC | PRN
  Administered 2016-01-19: 10 mL
  Filled 2016-01-19: qty 10

## 2016-01-19 MED ORDER — ACETAMINOPHEN 325 MG PO TABS
ORAL_TABLET | ORAL | Status: AC
  Filled 2016-01-19: qty 1

## 2016-01-19 MED ORDER — ACETAMINOPHEN 325 MG PO TABS
325.0000 mg | ORAL_TABLET | Freq: Once | ORAL | Status: AC
  Administered 2016-01-19: 325 mg via ORAL

## 2016-01-19 NOTE — Patient Instructions (Signed)
Blood Transfusion   A blood transfusion is a procedure that gives you donated blood through an IV tube. You may need blood because of illness, surgery, or injury. The blood may come from a donor. The blood may also be your own blood that you donated earlier.  The blood you get is made up of different types of cells. You may get:    Red blood cells. These carry oxygen and replace lost blood.    Platelets. These control bleeding.    Plasma. This helps blood to clot.  If you have a clotting disorder, you may also get other types of blood products.   BEFORE THE PROCEDURE   You may have a blood test. This finds out what type of blood you have. It also finds out what kind of blood your body will accept.    If you are going to have a planned surgery, you may donate your own blood. This is done in case you need to have a transfusion.    If you have had an allergic transfusion reaction before, you may be given medicine to help prevent a reaction. Take this medicine only as told by your doctor.   You will have your temperature, blood pressure, and pulse checked.  PROCEDURE    An IV will be started in your hand or arm.    The bag of donated blood will be attached to your IV and run into your vein.    A doctor will regularly check your temperature, blood pressure, and pulse during the procedure. This is done to find any early signs of a transfusion reaction.   If you have any signs or symptoms of a reaction, the procedure may be stopped and you may be given medicine.    When the transfusion is over, your IV will be removed.    Pressure may be applied to the IV site for a few minutes.    A bandage (dressing) will be applied.   The procedure may vary among doctors and hospitals.   AFTER THE PROCEDURE   Your blood pressure, temperature, and pulse will be checked regularly.     This information is not intended to replace advice given to you by your health care provider. Make sure you discuss any questions  you have with your health care provider.     Document Released: 08/30/2008 Document Revised: 06/24/2014 Document Reviewed: 04/13/2014  Elsevier Interactive Patient Education 2016 [employer] Inc[employer]

## 2016-01-20 DIAGNOSIS — Z66 Do not resuscitate: Secondary | ICD-10-CM | POA: Insufficient documentation

## 2016-01-22 LAB — TYPE AND SCREEN
ABO/RH(D): O POS
Antibody Screen: NEGATIVE
Unit division: 0
Unit division: 0

## 2016-01-24 NOTE — Progress Notes (Signed)
[place] Social Work  Clinical Social Work was referred by medical oncologist to review and complete healthcare advance directives.  Clinical Social Worker met with patient in infusion room. The patient designated daughter Elmo Putt as their primary healthcare agent and aunt Elsworth Soho "Barbaraann Share" as their secondary agent.  Patient also completed healthcare living will.  Patient does not desire life prolonging measures in the end of life scenarios indicated- CSW and patient discussed her wishes and CSW encouraged patient to share this information with her daughter/HCPOA.  Clinical Social Worker notarized documents and made copies for patient/family. Clinical Social Worker will send documents to medical records to be scanned into patient's chart. Clinical Social Worker encouraged patient/family to contact with any additional questions or concerns.  Polo Riley, MSW, Donna Worker [facility] [phone]

## 2016-01-25 ENCOUNTER — Other Ambulatory Visit (HOSPITAL_BASED_OUTPATIENT_CLINIC_OR_DEPARTMENT_OTHER): Payer: Medicare Other

## 2016-01-25 ENCOUNTER — Other Ambulatory Visit: Payer: Medicare Other

## 2016-01-25 ENCOUNTER — Ambulatory Visit: Payer: Medicare Other

## 2016-01-25 ENCOUNTER — Ambulatory Visit (HOSPITAL_BASED_OUTPATIENT_CLINIC_OR_DEPARTMENT_OTHER): Payer: Medicare Other

## 2016-01-25 VITALS — BP 83/56 | HR 81 | Temp 98.3°F | Resp 18

## 2016-01-25 DIAGNOSIS — C541 Malignant neoplasm of endometrium: Secondary | ICD-10-CM

## 2016-01-25 DIAGNOSIS — Z95828 Presence of other vascular implants and grafts: Secondary | ICD-10-CM

## 2016-01-25 DIAGNOSIS — C7800 Secondary malignant neoplasm of unspecified lung: Secondary | ICD-10-CM

## 2016-01-25 DIAGNOSIS — Z5111 Encounter for antineoplastic chemotherapy: Secondary | ICD-10-CM | POA: Diagnosis not present

## 2016-01-25 LAB — CBC WITH DIFFERENTIAL/PLATELET
BASO%: 0.5 % (ref 0.0–2.0)
BASOS ABS: 0 10*3/uL (ref 0.0–0.1)
EOS%: 0.9 % (ref 0.0–7.0)
Eosinophils Absolute: 0 10*3/uL (ref 0.0–0.5)
HCT: 30.7 % — ABNORMAL LOW (ref 34.8–46.6)
HEMOGLOBIN: 9.6 g/dL — AB (ref 11.6–15.9)
LYMPH#: 1.4 10*3/uL (ref 0.9–3.3)
LYMPH%: 31.2 % (ref 14.0–49.7)
MCH: 29.2 pg (ref 25.1–34.0)
MCHC: 31.3 g/dL — AB (ref 31.5–36.0)
MCV: 93.3 fL (ref 79.5–101.0)
MONO#: 0.4 10*3/uL (ref 0.1–0.9)
MONO%: 9.9 % (ref 0.0–14.0)
NEUT#: 2.5 10*3/uL (ref 1.5–6.5)
NEUT%: 57.5 % (ref 38.4–76.8)
Platelets: 336 10*3/uL (ref 145–400)
RBC: 3.29 10*6/uL — ABNORMAL LOW (ref 3.70–5.45)
RDW: 18 % — AB (ref 11.2–14.5)
WBC: 4.3 10*3/uL (ref 3.9–10.3)

## 2016-01-25 LAB — COMPREHENSIVE METABOLIC PANEL
ALBUMIN: 2.2 g/dL — AB (ref 3.5–5.0)
ALT: 38 U/L (ref 0–55)
AST: 32 U/L (ref 5–34)
Alkaline Phosphatase: 188 U/L — ABNORMAL HIGH (ref 40–150)
Anion Gap: 9 mEq/L (ref 3–11)
BUN: 10.8 mg/dL (ref 7.0–26.0)
CHLORIDE: 101 meq/L (ref 98–109)
CO2: 26 mEq/L (ref 22–29)
CREATININE: 0.7 mg/dL (ref 0.6–1.1)
Calcium: 10 mg/dL (ref 8.4–10.4)
EGFR: >90 mL/min/{1.73_m2} (ref >90)
GLUCOSE: 102 mg/dL (ref 70–140)
POTASSIUM: 3.8 meq/L (ref 3.5–5.1)
SODIUM: 137 meq/L (ref 136–145)
Total Bilirubin: <0.3 mg/dL (ref 0.20–1.20)
Total Protein: 9.9 g/dL — ABNORMAL HIGH (ref 6.4–8.3)

## 2016-01-25 MED ORDER — DIPHENHYDRAMINE HCL 50 MG/ML IJ SOLN
INTRAMUSCULAR | Status: AC
  Filled 2016-01-25: qty 1

## 2016-01-25 MED ORDER — SODIUM CHLORIDE 0.9 % IV SOLN
20.0000 mg | Freq: Once | INTRAVENOUS | Status: AC
  Administered 2016-01-25: 20 mg via INTRAVENOUS
  Filled 2016-01-25: qty 2

## 2016-01-25 MED ORDER — SODIUM CHLORIDE 0.9 % IV SOLN
80.0000 mg/m2 | Freq: Once | INTRAVENOUS | Status: AC
  Administered 2016-01-25: 138 mg via INTRAVENOUS
  Filled 2016-01-25: qty 23

## 2016-01-25 MED ORDER — SODIUM CHLORIDE 0.9 % IJ SOLN
10.0000 mL | INTRAMUSCULAR | Status: DC | PRN
  Administered 2016-01-25: 10 mL via INTRAVENOUS
  Filled 2016-01-25: qty 10

## 2016-01-25 MED ORDER — FAMOTIDINE IN NACL 20-0.9 MG/50ML-% IV SOLN
INTRAVENOUS | Status: AC
  Filled 2016-01-25: qty 50

## 2016-01-25 MED ORDER — SODIUM CHLORIDE 0.9 % IV SOLN
Freq: Once | INTRAVENOUS | Status: AC
  Administered 2016-01-25: 14:00:00 via INTRAVENOUS

## 2016-01-25 MED ORDER — HEPARIN SOD (PORK) LOCK FLUSH 100 UNIT/ML IV SOLN
500.0000 [IU] | Freq: Once | INTRAVENOUS | Status: AC | PRN
  Administered 2016-01-25: 500 [IU] via INTRAVENOUS
  Filled 2016-01-25: qty 5

## 2016-01-25 MED ORDER — FAMOTIDINE IN NACL 20-0.9 MG/50ML-% IV SOLN
20.0000 mg | Freq: Once | INTRAVENOUS | Status: AC
Start: 2016-01-25 — End: 2016-01-25
  Administered 2016-01-25: 20 mg via INTRAVENOUS

## 2016-01-25 MED ORDER — DIPHENHYDRAMINE HCL 50 MG/ML IJ SOLN
25.0000 mg | Freq: Once | INTRAMUSCULAR | Status: AC
  Administered 2016-01-25: 25 mg via INTRAVENOUS

## 2016-01-25 NOTE — Patient Instructions (Signed)

## 2016-01-25 NOTE — Patient Instructions (Signed)
[facility] Discharge Instructions for Patients Receiving Chemotherapy  Today you received the following chemotherapy agents Taxol   To help prevent nausea and vomiting after your treatment, we encourage you to take your nausea medication as directed.   If you develop nausea and vomiting that is not controlled by your nausea medication, call the clinic.   BELOW ARE SYMPTOMS THAT SHOULD BE REPORTED IMMEDIATELY:  *FEVER GREATER THAN 100.5 F  *CHILLS WITH OR WITHOUT FEVER  NAUSEA AND VOMITING THAT IS NOT CONTROLLED WITH YOUR NAUSEA MEDICATION  *UNUSUAL SHORTNESS OF BREATH  *UNUSUAL BRUISING OR BLEEDING  TENDERNESS IN MOUTH AND THROAT WITH OR WITHOUT PRESENCE OF ULCERS  *URINARY PROBLEMS  *BOWEL PROBLEMS  UNUSUAL RASH Items with * indicate a potential emergency and should be followed up as soon as possible.  Feel free to call the clinic you have any questions or concerns. The clinic phone number is [phone].  Please show the CHEMO ALERT CARD at check-in to the Emergency Department and triage nurse.   

## 2016-01-26 ENCOUNTER — Ambulatory Visit (HOSPITAL_BASED_OUTPATIENT_CLINIC_OR_DEPARTMENT_OTHER): Payer: Medicare Other

## 2016-01-26 VITALS — BP 109/74 | HR 86 | Temp 97.6°F | Resp 20

## 2016-01-26 DIAGNOSIS — C541 Malignant neoplasm of endometrium: Secondary | ICD-10-CM | POA: Diagnosis not present

## 2016-01-26 DIAGNOSIS — C7800 Secondary malignant neoplasm of unspecified lung: Secondary | ICD-10-CM

## 2016-01-26 MED ORDER — TBO-FILGRASTIM 300 MCG/0.5ML ~~LOC~~ SOSY
300.0000 ug | PREFILLED_SYRINGE | Freq: Once | SUBCUTANEOUS | Status: AC
  Administered 2016-01-26: 300 ug via SUBCUTANEOUS
  Filled 2016-01-26: qty 0.5

## 2016-01-26 NOTE — Patient Instructions (Signed)

## 2016-01-31 ENCOUNTER — Other Ambulatory Visit: Payer: Self-pay | Admitting: Oncology

## 2016-02-01 ENCOUNTER — Encounter: Payer: Self-pay | Admitting: Oncology

## 2016-02-01 ENCOUNTER — Ambulatory Visit (HOSPITAL_BASED_OUTPATIENT_CLINIC_OR_DEPARTMENT_OTHER): Payer: Medicare Other | Admitting: Oncology

## 2016-02-01 ENCOUNTER — Other Ambulatory Visit (HOSPITAL_BASED_OUTPATIENT_CLINIC_OR_DEPARTMENT_OTHER): Payer: Medicare Other

## 2016-02-01 ENCOUNTER — Ambulatory Visit: Payer: Medicare Other | Admitting: Nutrition

## 2016-02-01 ENCOUNTER — Ambulatory Visit (HOSPITAL_BASED_OUTPATIENT_CLINIC_OR_DEPARTMENT_OTHER): Payer: Medicare Other

## 2016-02-01 ENCOUNTER — Telehealth: Payer: Self-pay | Admitting: Oncology

## 2016-02-01 ENCOUNTER — Ambulatory Visit: Payer: Medicare Other

## 2016-02-01 ENCOUNTER — Telehealth: Payer: Self-pay | Admitting: *Deleted

## 2016-02-01 ENCOUNTER — Other Ambulatory Visit: Payer: Medicare Other

## 2016-02-01 VITALS — BP 91/64 | HR 89 | Temp 97.9°F | Resp 18 | Wt 131.0 lb

## 2016-02-01 DIAGNOSIS — C541 Malignant neoplasm of endometrium: Secondary | ICD-10-CM | POA: Diagnosis present

## 2016-02-01 DIAGNOSIS — C7800 Secondary malignant neoplasm of unspecified lung: Secondary | ICD-10-CM

## 2016-02-01 DIAGNOSIS — Z5111 Encounter for antineoplastic chemotherapy: Secondary | ICD-10-CM | POA: Diagnosis not present

## 2016-02-01 DIAGNOSIS — I959 Hypotension, unspecified: Secondary | ICD-10-CM

## 2016-02-01 DIAGNOSIS — D509 Iron deficiency anemia, unspecified: Secondary | ICD-10-CM

## 2016-02-01 DIAGNOSIS — Z95828 Presence of other vascular implants and grafts: Secondary | ICD-10-CM

## 2016-02-01 DIAGNOSIS — J910 Malignant pleural effusion: Secondary | ICD-10-CM | POA: Diagnosis not present

## 2016-02-01 DIAGNOSIS — G620 Drug-induced polyneuropathy: Secondary | ICD-10-CM | POA: Diagnosis not present

## 2016-02-01 DIAGNOSIS — T451X5A Adverse effect of antineoplastic and immunosuppressive drugs, initial encounter: Secondary | ICD-10-CM

## 2016-02-01 DIAGNOSIS — Z66 Do not resuscitate: Secondary | ICD-10-CM

## 2016-02-01 LAB — CBC WITH DIFFERENTIAL/PLATELET
BASO%: 0.7 % (ref 0.0–2.0)
Basophils Absolute: 0 10*3/uL (ref 0.0–0.1)
EOS ABS: 0 10*3/uL (ref 0.0–0.5)
EOS%: 1 % (ref 0.0–7.0)
HEMATOCRIT: 29.7 % — AB (ref 34.8–46.6)
HEMOGLOBIN: 9.3 g/dL — AB (ref 11.6–15.9)
LYMPH%: 29.5 % (ref 14.0–49.7)
MCH: 29.2 pg (ref 25.1–34.0)
MCHC: 31.3 g/dL — AB (ref 31.5–36.0)
MCV: 93.1 fL (ref 79.5–101.0)
MONO#: 0.2 10*3/uL (ref 0.1–0.9)
MONO%: 5.8 % (ref 0.0–14.0)
NEUT%: 63 % (ref 38.4–76.8)
NEUTROS ABS: 2.6 10*3/uL (ref 1.5–6.5)
NRBC: 0 % (ref 0–0)
PLATELETS: 221 10*3/uL (ref 145–400)
RBC: 3.19 10*6/uL — ABNORMAL LOW (ref 3.70–5.45)
RDW: 17.9 % — ABNORMAL HIGH (ref 11.2–14.5)
WBC: 4.1 10*3/uL (ref 3.9–10.3)
lymph#: 1.2 10*3/uL (ref 0.9–3.3)

## 2016-02-01 LAB — COMPREHENSIVE METABOLIC PANEL
ALBUMIN: 2.2 g/dL — AB (ref 3.5–5.0)
ALK PHOS: 201 U/L — AB (ref 40–150)
ALT: 36 U/L (ref 0–55)
ANION GAP: 9 meq/L (ref 3–11)
AST: 28 U/L (ref 5–34)
BILIRUBIN TOTAL: 0.32 mg/dL (ref 0.20–1.20)
BUN: 13.5 mg/dL (ref 7.0–26.0)
CALCIUM: 9.8 mg/dL (ref 8.4–10.4)
CO2: 26 mEq/L (ref 22–29)
CREATININE: 0.7 mg/dL (ref 0.6–1.1)
Chloride: 102 mEq/L (ref 98–109)
EGFR: >90 mL/min/{1.73_m2} (ref >90)
Glucose: 130 mg/dl (ref 70–140)
Potassium: 4.1 mEq/L (ref 3.5–5.1)
Sodium: 138 mEq/L (ref 136–145)
TOTAL PROTEIN: 9.5 g/dL — AB (ref 6.4–8.3)

## 2016-02-01 MED ORDER — FAMOTIDINE IN NACL 20-0.9 MG/50ML-% IV SOLN
20.0000 mg | Freq: Once | INTRAVENOUS | Status: AC
  Administered 2016-02-01: 20 mg via INTRAVENOUS

## 2016-02-01 MED ORDER — SODIUM CHLORIDE 0.9 % IV SOLN
Freq: Once | INTRAVENOUS | Status: AC
  Administered 2016-02-01: 13:00:00 via INTRAVENOUS

## 2016-02-01 MED ORDER — SODIUM CHLORIDE 0.9 % IJ SOLN
10.0000 mL | INTRAMUSCULAR | Status: DC | PRN
  Administered 2016-02-01: 10 mL via INTRAVENOUS
  Filled 2016-02-01: qty 10

## 2016-02-01 MED ORDER — FAMOTIDINE IN NACL 20-0.9 MG/50ML-% IV SOLN
INTRAVENOUS | Status: AC
  Filled 2016-02-01: qty 50

## 2016-02-01 MED ORDER — HEPARIN SOD (PORK) LOCK FLUSH 100 UNIT/ML IV SOLN
500.0000 [IU] | Freq: Once | INTRAVENOUS | Status: AC | PRN
  Administered 2016-02-01: 500 [IU] via INTRAVENOUS
  Filled 2016-02-01: qty 5

## 2016-02-01 MED ORDER — DIPHENHYDRAMINE HCL 50 MG/ML IJ SOLN
25.0000 mg | Freq: Once | INTRAMUSCULAR | Status: AC
  Administered 2016-02-01: 25 mg via INTRAVENOUS

## 2016-02-01 MED ORDER — SODIUM CHLORIDE 0.9 % IV SOLN
20.0000 mg | Freq: Once | INTRAVENOUS | Status: AC
  Administered 2016-02-01: 20 mg via INTRAVENOUS
  Filled 2016-02-01: qty 2

## 2016-02-01 MED ORDER — DIPHENHYDRAMINE HCL 50 MG/ML IJ SOLN
INTRAMUSCULAR | Status: AC
  Filled 2016-02-01: qty 1

## 2016-02-01 MED ORDER — SODIUM CHLORIDE 0.9 % IV SOLN
80.0000 mg/m2 | Freq: Once | INTRAVENOUS | Status: AC
  Administered 2016-02-01: 138 mg via INTRAVENOUS
  Filled 2016-02-01: qty 23

## 2016-02-01 NOTE — Telephone Encounter (Signed)
Received call from [name] to cancel follow up per Dr. Marko Plume due to chemotherapy treatments. Appointment has been canceled.

## 2016-02-01 NOTE — Telephone Encounter (Signed)
appt made and updated avs to print in treatment room

## 2016-02-01 NOTE — Progress Notes (Signed)
Nutrition follow-up completed with patient during infusion for metastatic endometrial cancer. Patient's weight has decreased and was documented as 131 pounds, down from 133.9 pounds. Patient reports she feels better than she has for a long time. She denies nausea, vomiting, and constipation. She is drinking one to 2 oral nutrition supplements daily.  Nutrition diagnosis: Inadequate oral intake has continued.  Intervention:  Educated patient to increase oral nutrition supplements 3+ supplements daily. Provided coupons. Reviewed high-calorie high-protein foods. Questions were answered.  Teach back method used.  Monitoring, evaluation, goals:  Patient will tolerate increased calories and protein to promote weight stabilization.  Next visit: Thursday, August 31 during infusion.  **Disclaimer: This note was dictated with voice recognition software. Similar sounding words can inadvertently be transcribed and this note may contain transcription errors which may not have been corrected upon publication of note.**

## 2016-02-01 NOTE — Progress Notes (Signed)
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 She began dose dense carbo taxol  On 01-04-16 with granix support, day 8 cycle 1 delayed until 01-25-16 and due "day 15" today.  She had PRBCs x 2 units 8-3 and 01-19-16.  Patient has felt stronger since PRBCs, tho still not able to manage regular home activities; elderly aunts and daughter from [place] are helping, and she would like to speak with [facility] social worker about other assistance. She has had no nausea and no increase in peripheral neuropathy. She has tightness and discomfort left lower chest and around under left breast as she has had since VATS and likely also related to disease involvement there. She denies increased SOB or coughing. She is eating and drinking fluids including "alkaline water", discussed. Bowels are moving. No new or different pain. No fever, no bleeding other than slight from nose x1 this week, no LE swelling, no problems with PAC. Anxiety, wants to talk with support staff. Remainder of 10 point Review of Systems negative   PAC in [place] on 12-29-15, ? had IV iron also while in hospital for VATS   ONCOLOGIC HISTORY Patient had one episode of post menopausal vaginal bleeding early 2015. Later in 2015 she had persistent vaginal discharge, for which she was seen by PCP Suzann Hedgecock in ~ 02-2014, had [facility] in [facility] and was referred to Dr Adella Nissen, gyn in [facility]. Endometrial biopsy had concern (that path not included in information sent for consultation) such that she was referred to Dr Jacquelyne Balint. Surgery by  Dr Sabra Heck at [facility] in [place] on 04-06-14 was TAH, BSO, omentectomy, pelvic and right common iliac node evaluation; patient was hospitalized x 3 days and tells me that she recovered well from the surgery. 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 Bulk of the carcinoma was in lower uterine segment where it was within 0.1 cm of serosal surface. Cytology positive for adenocarcinoma on washings ([id number]). Surgical findings were significant for no paraaortic adenopathy apparent. Patient has received 3 cycles of adjuvant chemotherapy in [facility] by Dr Sabra Heck, on 05-05-14, 05-26-14 and 07-29-14. Per records and patient, delay of cycle 3 was related to low counts and insurance changes. She received neulasta after chemotherapy on 07-29-14. Taxol was given at 175 mg/m2 for total dose was 280 mg cycle 1 and 291 mg for cycles 2 and 3; carboplatin was AUC = 6 with total dose 610 mg cycle 1, 620 mg cycle 2 and 630 mg cycle 3.. She does not recall using oral decadron premed for taxol, with premeds listed on chemo flowsheets standard decadron 20 mg, zofran 16 mg, benadryl 50 mg (which caused severe restless legs) and pepcid 20 mg. Outside information does not include serial blood counts. Last note from Dr Sabra Heck dated 07-29-14 describes unremarkable exam, "NED during chemotherapy and adequate PS". Patient was see by Dr Denman George 08-09-14, who  recommends restaging scans after 6 cycles of chemotherapy and consideration of vaginal brachytherapy; she has not been seen by radiation oncology in [place]. CT AP done in [facility] system 08-15-14 (due to elevated LFTs and blood in urine just after transfer of care) with soft tissue fullness  at superior abdominal aorta and question of retroperitoneal necrotic adenopathy; plan repeat scans after complete present chemotherapy. Cycles 4 -6 carbo taxol given 08-24-14 thru 10-21-14, with gCSF support (last cycle carboplatin only due to peripheral neuropathy in feet). CT AP 12-25-14 without adenopathy or other apparent residual or recurrent malignancy.  Radiation treatment dates: July 19, July 27, July 29, August 9, August 11 Site/dose: Proximal vagina, 30 gray in 5 fractions Patient was hospitalized at [facility] 6-15 thru 12-06-15 after presenting with progressive SOB, with finding of large left pleural effusion. CT chest 11-30-15 showed new enlarged and necrotic right hilar and mediastinal nodes, multiple right pulmonary masses, large left pleural effusion with pleural mass, tiny lucent foci too small to characterize in thoracic vertebrae. She had thoracentesis by pulmonary for 1.5 liters on 11-30-15, then left pleurex catheter placed by IR on 12-04-15 with additional 800 cc removed then. Cytology [id number] adenocarcinoma (appeared serous per pathologist verbal report). CT AP 12-01-15 showed no new or suspicious liver findings, 1.2 cm left periaortic node, large nodal mass left pelvic sidewall 6.2 x 2.9 cm, no ascites or peritoneal changes, no apparent bony findings. She had day 1 cycle 1 dose dense carbo taxol on 01-04-16, with delay for day 8 and day 15 cycle 1.    Anemia: hemoglobin ranged from 8.1 - 12.6 thru chemo, with MCV 103 when I met her after first chemo and up to 109 during treatment. Iron studies 08-2014 had serum iron 43, %sat 22, ferritin 429, B12 >2000 SIEP 10-2014 had no monoclonal protein (elevated IgG and IgA polyclonal). Urinalyses had microscopic hematuria, with cystoscopy by Dr Burman Nieves at [facility] 10-20-2014 unremarkable. Haptoglobin 08-2014 not low (449), LDH normal, total bili occasionally elevated (2-29-26) but often normal. She was transfused 2 units PRBCs 09-2014 for  hemoglobin 8.1.  Colonoscopy by Dr Oretha Caprice [facility] 05-23-15 unable to advance pediatric scope beyond sigmoid colon, which seemed to be fixed..   Objective:  Vital signs in last 24 hours:  BP 91/64 (BP Location: Left Arm, Patient Position: Sitting)   Pulse 89   Temp 97.9 F (36.6 C) (Oral)   Resp 18   Wt 131 lb (59.4 kg)   SpO2 98%   BMI 21.14 kg/m  Weight down 2.5 lbs. Respirations not labored RA. Looks brighter today. Alert, oriented and appropriate. Ambulatory without assistance.   HEENT:PERRL, sclerae not icteric. Oral mucosa moist without lesions, posterior pharynx clear.  Neck supple. No JVD.  Lymphatics:no cervical,supraclavicular adenopathy Resp: decreased BS left lower 1/2 lung without wheezes or rales bilaterally.  Cardio: regular rate and rhythm. No gallop. GI: soft, nontender including epigastrium, not distended, no mass or organomegaly. Normally active bowel sounds. Surgical incision not remarkable. Musculoskeletal/ Extremities: LE without pitting edema, cords, tenderness Neuro: no peripheral neuropathy. Extremely sensitive to light touch left lateral chest and beneath left breast, no rash. Otherwise nonfocal. PSYCH mood and affect better Skin without rash, ecchymosis, petechiae Portacath-without erythema or tenderness  Lab Results:  Results for orders placed or performed in visit on 02/01/16  CBC with Differential  Result Value Ref Range   WBC 4.1 3.9 - 10.3 10e3/uL   NEUT# 2.6 1.5 - 6.5 10e3/uL   HGB 9.3 (L)  11.6 - 15.9 g/dL   HCT 29.7 (L) 34.8 - 46.6 %   Platelets 221 145 - 400 10e3/uL   MCV 93.1 79.5 - 101.0 fL   MCH 29.2 25.1 - 34.0 pg   MCHC 31.3 (L) 31.5 - 36.0 g/dL   RBC 3.19 (L) 3.70 - 5.45 10e6/uL   RDW 17.9 (H) 11.2 - 14.5 %   lymph# 1.2 0.9 - 3.3 10e3/uL   MONO# 0.2 0.1 - 0.9 10e3/uL   Eosinophils Absolute 0.0 0.0 - 0.5 10e3/uL   Basophils Absolute 0.0 0.0 - 0.1 10e3/uL   NEUT% 63.0 38.4 - 76.8 %   LYMPH% 29.5 14.0 - 49.7 %   MONO% 5.8  0.0 - 14.0 %   EOS% 1.0 0.0 - 7.0 %   BASO% 0.7 0.0 - 2.0 %   nRBC 0 0 - 0 %  Comprehensive metabolic panel  Result Value Ref Range   Sodium 138 136 - 145 mEq/L   Potassium 4.1 3.5 - 5.1 mEq/L   Chloride 102 98 - 109 mEq/L   CO2 26 22 - 29 mEq/L   Glucose 130 70 - 140 mg/dl   BUN 13.5 7.0 - 26.0 mg/dL   Creatinine 0.7 0.6 - 1.1 mg/dL   Total Bilirubin 0.32 0.20 - 1.20 mg/dL   Alkaline Phosphatase 201 (H) 40 - 150 U/L   AST 28 5 - 34 U/L   ALT 36 0 - 55 U/L   Total Protein 9.5 (H) 6.4 - 8.3 g/dL   Albumin 2.2 (L) 3.5 - 5.0 g/dL   Calcium 9.8 8.4 - 10.4 mg/dL   Anion Gap 9 3 - 11 mEq/L   EGFR >90 >90 ml/min/1.73 m2     Studies/Results:  No results found.  Medications: I have reviewed the patient's current medications.  DISCUSSION Interval history discussed. She wants to continue chemo as tolerated in hopes of improving the metastatic cancer.  Appointment with Dr Sondra Come for 8-24 was set up prior to recurrent cancer. I will let him know situation and will cancel that appointment due to rest of situation now.   Advance Directives completed with [facility] SW since she was here last  Message to [facility] SW and chaplain re assistance at home, wig, massage, meditation.   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 Treatment in attempt to palliate and control disease. Day 1 cycle 1 carbo taxol 01-04-16, days 8 and 15 delayed, "day 15" today and we will continue as possible until we can tell if any improvement.   2.malignant left pleural effusion as well as involvement left pleura and lungs: post left VATS with sclerosis by Dr Prescott Gum 12-22-15, pleurex removed, breathing stable 3.DNR patient's request. Advance Directives completed.  4.iron deficiency anemia: she had feraheme on 7-14 and possibly another dose in  hospital early July. Transfused 2 units PRBCs early Aug. 5.elevated total protein and low albumin: SPEP previously no M spike. 24 hr urine 09-12-15 no M spike. 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  8.PAC in 9. Long past tobacco: DCd 09-2015. Emphysematous changes on CT. Known to [name]  10. Microscopic hematuria: known to Dr Louis Meckel. Urine cytology negative  11.unable to visualize beyond sigmoid on colonoscopy 12-16 due to stricture  12.history substance abuse including  ETOH and marijuana 13. long history anxiety and depression 14.cytopenias with previous chemo: granix  All questions answered. Chemo and granix orders confirmed. Time spent 25 min including >50% counseling and coordination of care. Route PCP, CC Dr Sabra Heck and Dr Sondra Come  Evlyn Clines, MD   02/01/2016, 7:18 PM

## 2016-02-01 NOTE — Patient Instructions (Signed)
[facility] Discharge Instructions for Patients Receiving Chemotherapy  Today you received the following chemotherapy agents:  Taxol  To help prevent nausea and vomiting after your treatment, we encourage you to take your nausea medication as prescribed.   If you develop nausea and vomiting that is not controlled by your nausea medication, call the clinic.   BELOW ARE SYMPTOMS THAT SHOULD BE REPORTED IMMEDIATELY:  *FEVER GREATER THAN 100.5 F  *CHILLS WITH OR WITHOUT FEVER  NAUSEA AND VOMITING THAT IS NOT CONTROLLED WITH YOUR NAUSEA MEDICATION  *UNUSUAL SHORTNESS OF BREATH  *UNUSUAL BRUISING OR BLEEDING  TENDERNESS IN MOUTH AND THROAT WITH OR WITHOUT PRESENCE OF ULCERS  *URINARY PROBLEMS  *BOWEL PROBLEMS  UNUSUAL RASH Items with * indicate a potential emergency and should be followed up as soon as possible.  Feel free to call the clinic you have any questions or concerns. The clinic phone number is [phone].  Please show the CHEMO ALERT CARD at check-in to the Emergency Department and triage nurse.   

## 2016-02-02 ENCOUNTER — Ambulatory Visit (HOSPITAL_BASED_OUTPATIENT_CLINIC_OR_DEPARTMENT_OTHER): Payer: Medicare Other

## 2016-02-02 VITALS — BP 99/60 | HR 70 | Temp 98.3°F | Resp 20

## 2016-02-02 DIAGNOSIS — Z95828 Presence of other vascular implants and grafts: Secondary | ICD-10-CM

## 2016-02-02 DIAGNOSIS — C541 Malignant neoplasm of endometrium: Secondary | ICD-10-CM

## 2016-02-02 DIAGNOSIS — C7800 Secondary malignant neoplasm of unspecified lung: Secondary | ICD-10-CM | POA: Diagnosis not present

## 2016-02-02 MED ORDER — TBO-FILGRASTIM 300 MCG/0.5ML ~~LOC~~ SOSY
300.0000 ug | PREFILLED_SYRINGE | Freq: Once | SUBCUTANEOUS | Status: AC
  Administered 2016-02-02: 300 ug via SUBCUTANEOUS
  Filled 2016-02-02: qty 0.5

## 2016-02-02 MED ORDER — ALTEPLASE 2 MG IJ SOLR
2.0000 mg | Freq: Once | INTRAMUSCULAR | Status: DC | PRN
  Filled 2016-02-02: qty 2

## 2016-02-02 NOTE — Patient Instructions (Signed)

## 2016-02-07 ENCOUNTER — Other Ambulatory Visit: Payer: Self-pay | Admitting: Oncology

## 2016-02-08 ENCOUNTER — Other Ambulatory Visit: Payer: Self-pay | Admitting: Oncology

## 2016-02-08 ENCOUNTER — Ambulatory Visit: Payer: Self-pay | Admitting: Radiation Oncology

## 2016-02-08 ENCOUNTER — Ambulatory Visit: Payer: Medicare Other

## 2016-02-08 ENCOUNTER — Ambulatory Visit (HOSPITAL_BASED_OUTPATIENT_CLINIC_OR_DEPARTMENT_OTHER): Payer: Medicare Other

## 2016-02-08 ENCOUNTER — Other Ambulatory Visit (HOSPITAL_BASED_OUTPATIENT_CLINIC_OR_DEPARTMENT_OTHER): Payer: Medicare Other

## 2016-02-08 ENCOUNTER — Other Ambulatory Visit: Payer: Medicaid Other

## 2016-02-08 VITALS — BP 86/59 | HR 66 | Temp 98.6°F | Resp 18

## 2016-02-08 DIAGNOSIS — C541 Malignant neoplasm of endometrium: Secondary | ICD-10-CM

## 2016-02-08 DIAGNOSIS — C7800 Secondary malignant neoplasm of unspecified lung: Secondary | ICD-10-CM

## 2016-02-08 DIAGNOSIS — Z95828 Presence of other vascular implants and grafts: Secondary | ICD-10-CM

## 2016-02-08 DIAGNOSIS — Z5111 Encounter for antineoplastic chemotherapy: Secondary | ICD-10-CM | POA: Diagnosis not present

## 2016-02-08 LAB — CBC WITH DIFFERENTIAL/PLATELET
BASO%: 0.9 % (ref 0.0–2.0)
BASOS ABS: 0 10*3/uL (ref 0.0–0.1)
EOS%: 1.5 % (ref 0.0–7.0)
Eosinophils Absolute: 0 10*3/uL (ref 0.0–0.5)
HCT: 28.1 % — ABNORMAL LOW (ref 34.8–46.6)
HGB: 8.9 g/dL — ABNORMAL LOW (ref 11.6–15.9)
LYMPH%: 37 % (ref 14.0–49.7)
MCH: 29.8 pg (ref 25.1–34.0)
MCHC: 31.7 g/dL (ref 31.5–36.0)
MCV: 93.8 fL (ref 79.5–101.0)
MONO#: 0.3 10*3/uL (ref 0.1–0.9)
MONO%: 9.2 % (ref 0.0–14.0)
NEUT%: 51.4 % (ref 38.4–76.8)
NEUTROS ABS: 1.5 10*3/uL (ref 1.5–6.5)
PLATELETS: 246 10*3/uL (ref 145–400)
RBC: 2.99 10*6/uL — AB (ref 3.70–5.45)
RDW: 20 % — ABNORMAL HIGH (ref 11.2–14.5)
WBC: 3 10*3/uL — ABNORMAL LOW (ref 3.9–10.3)
lymph#: 1.1 10*3/uL (ref 0.9–3.3)

## 2016-02-08 LAB — COMPREHENSIVE METABOLIC PANEL
ALT: 37 U/L (ref 0–55)
AST: 24 U/L (ref 5–34)
Albumin: 2.2 g/dL — ABNORMAL LOW (ref 3.5–5.0)
Alkaline Phosphatase: 190 U/L — ABNORMAL HIGH (ref 40–150)
Anion Gap: 8 mEq/L (ref 3–11)
BUN: 14.3 mg/dL (ref 7.0–26.0)
CO2: 27 mEq/L (ref 22–29)
Calcium: 9.8 mg/dL (ref 8.4–10.4)
Chloride: 102 mEq/L (ref 98–109)
Creatinine: 0.7 mg/dL (ref 0.6–1.1)
EGFR: >90 mL/min/{1.73_m2} (ref >90)
Glucose: 131 mg/dl (ref 70–140)
Potassium: 4 mEq/L (ref 3.5–5.1)
Sodium: 137 mEq/L (ref 136–145)
Total Bilirubin: <0.3 mg/dL (ref 0.20–1.20)
Total Protein: 9.3 g/dL — ABNORMAL HIGH (ref 6.4–8.3)

## 2016-02-08 MED ORDER — SODIUM CHLORIDE 0.9 % IV SOLN
80.0000 mg/m2 | Freq: Once | INTRAVENOUS | Status: AC
  Administered 2016-02-08: 138 mg via INTRAVENOUS
  Filled 2016-02-08: qty 23

## 2016-02-08 MED ORDER — FAMOTIDINE IN NACL 20-0.9 MG/50ML-% IV SOLN
INTRAVENOUS | Status: AC
  Filled 2016-02-08: qty 50

## 2016-02-08 MED ORDER — PALONOSETRON HCL INJECTION 0.25 MG/5ML
INTRAVENOUS | Status: AC
  Filled 2016-02-08: qty 5

## 2016-02-08 MED ORDER — SODIUM CHLORIDE 0.9 % IJ SOLN
10.0000 mL | INTRAMUSCULAR | Status: DC | PRN
  Administered 2016-02-08: 10 mL via INTRAVENOUS
  Filled 2016-02-08: qty 10

## 2016-02-08 MED ORDER — SODIUM CHLORIDE 0.9 % IV SOLN
20.0000 mg | Freq: Once | INTRAVENOUS | Status: AC
  Administered 2016-02-08: 20 mg via INTRAVENOUS
  Filled 2016-02-08: qty 2

## 2016-02-08 MED ORDER — SODIUM CHLORIDE 0.9 % IV SOLN
Freq: Once | INTRAVENOUS | Status: AC
  Administered 2016-02-08: 13:00:00 via INTRAVENOUS

## 2016-02-08 MED ORDER — HEPARIN SOD (PORK) LOCK FLUSH 100 UNIT/ML IV SOLN
500.0000 [IU] | Freq: Once | INTRAVENOUS | Status: AC | PRN
  Administered 2016-02-08: 500 [IU]
  Filled 2016-02-08: qty 5

## 2016-02-08 MED ORDER — PALONOSETRON HCL INJECTION 0.25 MG/5ML
0.2500 mg | Freq: Once | INTRAVENOUS | Status: AC
  Administered 2016-02-08: 0.25 mg via INTRAVENOUS

## 2016-02-08 MED ORDER — FAMOTIDINE IN NACL 20-0.9 MG/50ML-% IV SOLN
20.0000 mg | Freq: Once | INTRAVENOUS | Status: AC
  Administered 2016-02-08: 20 mg via INTRAVENOUS

## 2016-02-08 MED ORDER — DIPHENHYDRAMINE HCL 50 MG/ML IJ SOLN
25.0000 mg | Freq: Once | INTRAMUSCULAR | Status: AC
  Administered 2016-02-08: 25 mg via INTRAVENOUS

## 2016-02-08 MED ORDER — SODIUM CHLORIDE 0.9% FLUSH
10.0000 mL | INTRAVENOUS | Status: DC | PRN
  Administered 2016-02-08: 10 mL
  Filled 2016-02-08: qty 10

## 2016-02-08 MED ORDER — CARBOPLATIN CHEMO INTRADERMAL TEST DOSE 100MCG/0.02ML
100.0000 ug | Freq: Once | INTRADERMAL | Status: AC
  Administered 2016-02-08: 100 ug via INTRADERMAL
  Filled 2016-02-08: qty 0.01

## 2016-02-08 MED ORDER — DIPHENHYDRAMINE HCL 50 MG/ML IJ SOLN
INTRAMUSCULAR | Status: AC
  Filled 2016-02-08: qty 1

## 2016-02-08 MED ORDER — SODIUM CHLORIDE 0.9 % IV SOLN
490.5000 mg | Freq: Once | INTRAVENOUS | Status: AC
  Administered 2016-02-08: 490 mg via INTRAVENOUS
  Filled 2016-02-08: qty 49

## 2016-02-08 NOTE — Patient Instructions (Signed)

## 2016-02-08 NOTE — Patient Instructions (Signed)
[facility] Discharge Instructions for Patients Receiving Chemotherapy  Today you received the following chemotherapy agents Taxol and Carboplatin. To help prevent nausea and vomiting after your treatment, we encourage you to take your nausea medication as directed.  If you develop nausea and vomiting that is not controlled by your nausea medication, call the clinic.   BELOW ARE SYMPTOMS THAT SHOULD BE REPORTED IMMEDIATELY:  *FEVER GREATER THAN 100.5 F  *CHILLS WITH OR WITHOUT FEVER  NAUSEA AND VOMITING THAT IS NOT CONTROLLED WITH YOUR NAUSEA MEDICATION  *UNUSUAL SHORTNESS OF BREATH  *UNUSUAL BRUISING OR BLEEDING  TENDERNESS IN MOUTH AND THROAT WITH OR WITHOUT PRESENCE OF ULCERS  *URINARY PROBLEMS  *BOWEL PROBLEMS  UNUSUAL RASH Items with * indicate a potential emergency and should be followed up as soon as possible.  Feel free to call the clinic you have any questions or concerns. The clinic phone number is [phone].  Please show the CHEMO ALERT CARD at check-in to the Emergency Department and triage nurse.    

## 2016-02-09 ENCOUNTER — Ambulatory Visit (HOSPITAL_BASED_OUTPATIENT_CLINIC_OR_DEPARTMENT_OTHER): Payer: Medicare Other

## 2016-02-09 ENCOUNTER — Encounter: Payer: Self-pay | Admitting: *Deleted

## 2016-02-09 VITALS — BP 97/69 | HR 76 | Temp 98.0°F | Resp 17

## 2016-02-09 DIAGNOSIS — Z5189 Encounter for other specified aftercare: Secondary | ICD-10-CM

## 2016-02-09 DIAGNOSIS — C541 Malignant neoplasm of endometrium: Secondary | ICD-10-CM

## 2016-02-09 DIAGNOSIS — C7800 Secondary malignant neoplasm of unspecified lung: Secondary | ICD-10-CM | POA: Diagnosis not present

## 2016-02-09 MED ORDER — TBO-FILGRASTIM 300 MCG/0.5ML ~~LOC~~ SOSY
300.0000 ug | PREFILLED_SYRINGE | Freq: Once | SUBCUTANEOUS | Status: AC
  Administered 2016-02-09: 300 ug via SUBCUTANEOUS
  Filled 2016-02-09: qty 0.5

## 2016-02-09 NOTE — Progress Notes (Signed)
[place] Work  Clinical Social Work was referred by Futures trader for assessment of psychosocial needs and CSW received a voicemail from patient.  Clinical Social Worker contacted patient at home to offer support and assess for needs.  Ms. Lillo indicated to CSW she would like to change her healthcare advance directives, that she does not want to be a "do not resuscitate".  Clinical Social Worker explored patients desire to change directives, requested to meet with patient at next infusion to review advance directives and further explore patient's desire to change advance directives.  The patient shared feelings of anxiety, difficulty balancing schedule and managing side effects, and loss of control. CSW validated patient's feelings and provided brief emotional support. Ms. Sollitto shared she has "trouble turning my brain off", referring to anxious thoughts.  CSW discussed possible tools to help cope with anxiety including free counseling, meditation, guided imagery, massage, and support programs.  CSW will make referral to counseling intern and provide patient with additional information at their visit.  Ms. Timbrook plans to call to set up a free massage at cancer center (she has already utilized one free massage).  Patient was very appreciative of call and eager to utilize services.   Polo Riley, MSW, LCSW, OSW-C Clinical Social Worker [facility] [phone]

## 2016-02-09 NOTE — Patient Instructions (Signed)

## 2016-02-09 NOTE — Progress Notes (Signed)
Went over new schedule with patient for granix injection. Pt will be in Monday at 1pm.

## 2016-02-10 ENCOUNTER — Ambulatory Visit: Payer: Medicare Other

## 2016-02-11 ENCOUNTER — Other Ambulatory Visit: Payer: Self-pay | Admitting: Oncology

## 2016-02-12 ENCOUNTER — Ambulatory Visit (HOSPITAL_BASED_OUTPATIENT_CLINIC_OR_DEPARTMENT_OTHER): Payer: Medicare Other

## 2016-02-12 VITALS — BP 96/70 | HR 74 | Temp 97.6°F | Resp 20

## 2016-02-12 DIAGNOSIS — C541 Malignant neoplasm of endometrium: Secondary | ICD-10-CM | POA: Diagnosis present

## 2016-02-12 DIAGNOSIS — C7800 Secondary malignant neoplasm of unspecified lung: Secondary | ICD-10-CM | POA: Diagnosis not present

## 2016-02-12 DIAGNOSIS — Z5189 Encounter for other specified aftercare: Secondary | ICD-10-CM | POA: Diagnosis not present

## 2016-02-12 MED ORDER — TBO-FILGRASTIM 300 MCG/0.5ML ~~LOC~~ SOSY
300.0000 ug | PREFILLED_SYRINGE | Freq: Once | SUBCUTANEOUS | Status: AC
  Administered 2016-02-12: 300 ug via SUBCUTANEOUS
  Filled 2016-02-12: qty 0.5

## 2016-02-12 NOTE — Patient Instructions (Signed)

## 2016-02-15 ENCOUNTER — Encounter: Payer: Self-pay | Admitting: *Deleted

## 2016-02-15 ENCOUNTER — Encounter: Payer: Self-pay | Admitting: Oncology

## 2016-02-15 ENCOUNTER — Other Ambulatory Visit: Payer: Self-pay

## 2016-02-15 ENCOUNTER — Other Ambulatory Visit (HOSPITAL_BASED_OUTPATIENT_CLINIC_OR_DEPARTMENT_OTHER): Payer: Medicare Other

## 2016-02-15 ENCOUNTER — Ambulatory Visit (HOSPITAL_BASED_OUTPATIENT_CLINIC_OR_DEPARTMENT_OTHER): Payer: Medicare Other

## 2016-02-15 ENCOUNTER — Ambulatory Visit (HOSPITAL_BASED_OUTPATIENT_CLINIC_OR_DEPARTMENT_OTHER): Payer: Medicare Other | Admitting: Oncology

## 2016-02-15 ENCOUNTER — Ambulatory Visit: Payer: Medicare Other | Admitting: Nutrition

## 2016-02-15 ENCOUNTER — Other Ambulatory Visit: Payer: Self-pay | Admitting: Oncology

## 2016-02-15 VITALS — BP 90/65 | HR 81 | Temp 97.4°F | Resp 17 | Ht 66.0 in | Wt 130.1 lb

## 2016-02-15 VITALS — BP 105/88

## 2016-02-15 DIAGNOSIS — I951 Orthostatic hypotension: Secondary | ICD-10-CM

## 2016-02-15 DIAGNOSIS — C541 Malignant neoplasm of endometrium: Secondary | ICD-10-CM

## 2016-02-15 DIAGNOSIS — J910 Malignant pleural effusion: Secondary | ICD-10-CM | POA: Diagnosis not present

## 2016-02-15 DIAGNOSIS — G620 Drug-induced polyneuropathy: Secondary | ICD-10-CM | POA: Diagnosis not present

## 2016-02-15 DIAGNOSIS — C7801 Secondary malignant neoplasm of right lung: Secondary | ICD-10-CM

## 2016-02-15 DIAGNOSIS — Z95828 Presence of other vascular implants and grafts: Secondary | ICD-10-CM

## 2016-02-15 DIAGNOSIS — D509 Iron deficiency anemia, unspecified: Secondary | ICD-10-CM | POA: Diagnosis not present

## 2016-02-15 DIAGNOSIS — C782 Secondary malignant neoplasm of pleura: Secondary | ICD-10-CM | POA: Diagnosis not present

## 2016-02-15 DIAGNOSIS — I959 Hypotension, unspecified: Secondary | ICD-10-CM

## 2016-02-15 DIAGNOSIS — Z5111 Encounter for antineoplastic chemotherapy: Secondary | ICD-10-CM

## 2016-02-15 DIAGNOSIS — C7802 Secondary malignant neoplasm of left lung: Secondary | ICD-10-CM

## 2016-02-15 DIAGNOSIS — D500 Iron deficiency anemia secondary to blood loss (chronic): Secondary | ICD-10-CM

## 2016-02-15 LAB — CBC WITH DIFFERENTIAL/PLATELET
BASO%: 0.2 % (ref 0.0–2.0)
Basophils Absolute: 0 10*3/uL (ref 0.0–0.1)
EOS%: 0.6 % (ref 0.0–7.0)
Eosinophils Absolute: 0 10*3/uL (ref 0.0–0.5)
HCT: 29.1 % — ABNORMAL LOW (ref 34.8–46.6)
HGB: 9.1 g/dL — ABNORMAL LOW (ref 11.6–15.9)
LYMPH%: 32.2 % (ref 14.0–49.7)
MCH: 29.7 pg (ref 25.1–34.0)
MCHC: 31.3 g/dL — AB (ref 31.5–36.0)
MCV: 95.1 fL (ref 79.5–101.0)
MONO#: 0.3 10*3/uL (ref 0.1–0.9)
MONO%: 6.9 % (ref 0.0–14.0)
NEUT%: 60.1 % (ref 38.4–76.8)
NEUTROS ABS: 2.9 10*3/uL (ref 1.5–6.5)
Platelets: 258 10*3/uL (ref 145–400)
RBC: 3.06 10*6/uL — AB (ref 3.70–5.45)
RDW: 19.1 % — ABNORMAL HIGH (ref 11.2–14.5)
WBC: 4.8 10*3/uL (ref 3.9–10.3)
lymph#: 1.5 10*3/uL (ref 0.9–3.3)

## 2016-02-15 LAB — COMPREHENSIVE METABOLIC PANEL
ALT: 21 U/L (ref 0–55)
ANION GAP: 9 meq/L (ref 3–11)
AST: 18 U/L (ref 5–34)
Albumin: 2.4 g/dL — ABNORMAL LOW (ref 3.5–5.0)
Alkaline Phosphatase: 147 U/L (ref 40–150)
BUN: 10.5 mg/dL (ref 7.0–26.0)
CHLORIDE: 102 meq/L (ref 98–109)
CO2: 28 meq/L (ref 22–29)
Calcium: 9.7 mg/dL (ref 8.4–10.4)
Creatinine: 0.8 mg/dL (ref 0.6–1.1)
GLUCOSE: 115 mg/dL (ref 70–140)
Potassium: 4.1 mEq/L (ref 3.5–5.1)
SODIUM: 139 meq/L (ref 136–145)
TOTAL PROTEIN: 9.3 g/dL — AB (ref 6.4–8.3)
Total Bilirubin: <0.3 mg/dL (ref 0.20–1.20)

## 2016-02-15 MED ORDER — SODIUM CHLORIDE 0.9 % IV SOLN
20.0000 mg | Freq: Once | INTRAVENOUS | Status: AC
  Administered 2016-02-15: 20 mg via INTRAVENOUS
  Filled 2016-02-15: qty 2

## 2016-02-15 MED ORDER — FAMOTIDINE IN NACL 20-0.9 MG/50ML-% IV SOLN
20.0000 mg | Freq: Once | INTRAVENOUS | Status: AC
Start: 2016-02-15 — End: 2016-02-15
  Administered 2016-02-15: 20 mg via INTRAVENOUS

## 2016-02-15 MED ORDER — SODIUM CHLORIDE 0.9 % IV SOLN
80.0000 mg/m2 | Freq: Once | INTRAVENOUS | Status: AC
  Administered 2016-02-15: 138 mg via INTRAVENOUS
  Filled 2016-02-15: qty 23

## 2016-02-15 MED ORDER — DIPHENHYDRAMINE HCL 50 MG/ML IJ SOLN
25.0000 mg | Freq: Once | INTRAMUSCULAR | Status: AC
  Administered 2016-02-15: 25 mg via INTRAVENOUS

## 2016-02-15 MED ORDER — HEPARIN SOD (PORK) LOCK FLUSH 100 UNIT/ML IV SOLN
500.0000 [IU] | Freq: Once | INTRAVENOUS | Status: AC | PRN
  Administered 2016-02-15: 500 [IU] via INTRAVENOUS
  Filled 2016-02-15: qty 5

## 2016-02-15 MED ORDER — SODIUM CHLORIDE 0.9 % IJ SOLN
10.0000 mL | INTRAMUSCULAR | Status: DC | PRN
  Administered 2016-02-15: 10 mL via INTRAVENOUS
  Filled 2016-02-15: qty 10

## 2016-02-15 MED ORDER — DIPHENHYDRAMINE HCL 50 MG/ML IJ SOLN
INTRAMUSCULAR | Status: AC
  Filled 2016-02-15: qty 1

## 2016-02-15 MED ORDER — FLUDROCORTISONE ACETATE 0.1 MG PO TABS: 0.1000 mg | ORAL_TABLET | Freq: Every day | ORAL | 2 refills | Status: DC

## 2016-02-15 MED ORDER — SODIUM CHLORIDE 0.9 % IV SOLN
Freq: Once | INTRAVENOUS | Status: AC
  Administered 2016-02-15: 15:00:00 via INTRAVENOUS

## 2016-02-15 MED ORDER — FAMOTIDINE IN NACL 20-0.9 MG/50ML-% IV SOLN
INTRAVENOUS | Status: AC
  Filled 2016-02-15: qty 50

## 2016-02-15 NOTE — Progress Notes (Signed)
Counseling Intern - Progress Note   Patient was referred by Pennie Rushing. Met with patient during her infusion, she seemed happy to meet me and willing to talk. Patient shared that she was experiencing anxiety and racing thoughts; "can't slow down my mind." Patient shared that she feels she needs to be "strong" to survive this amount of pain. Patient cried when sharing her experience and said she had not cried since her initial diagnosis. When asked what it was like for her to cry, patient said she felt better and lighter. Patient asked if we could meet again; we decided I would come back during her next infusion on 02/29/16.   Rosana Fret, Counseling Intern Department of Spiritual Care  Direct Supervisor - Lorrin Jackson, Chaplain Voicemail [phone] .

## 2016-02-15 NOTE — Progress Notes (Signed)
[place] Work  Holiday representative met with patient and her daughter at [facility] during infusion to offer support and assess for needs.  Pt reports she is doing "ok", but still interested in additional support to decrease anxiety. CSW reviewed Luiz Ochoa wellness options, provided mediation CD and also discussed option of counseling intern. Pt very interested in meeting with counseling intern at some point and agrees to referral for counseling. CSW also encouraged pt to attend GYN Support Group. CSW also discussed ADRs as pt had several questions about what a DNR was. CSW explained the differences between ADRs and DNR. Pt had already completed her ADRs and is content with how they are done currently. She would like to remain a full code at this time and is aware to discuss this further with her MD if and when she would like to change this.   Pt reports she spoke with MD about CNA support through medicaid. CSW explained process for this and pt thought MD was working on this referral. Pt also interested in Meals On Wheels and CSW will complete this referral through [employer]. Pt denied other needs currently and appreciated CSW follow up. Pt aware to reach back out to [employer] as needed.     Clinical Social Work interventions: Supportive Psychiatric nurse education and referral  Loren Racer, [place]  [place] Phone: [phone] Fax: [phone]  Covering for:  Polo Riley, MSW, LCSW, OSW-C Clinical Social Worker [place] [phone]

## 2016-02-15 NOTE — Progress Notes (Signed)
OFFICE PROGRESS NOTE   February 15, 2016   Physicians:Emma Rossi,Brigitte Clelia Croft Hedgecock(PCP, [facility] [employer]), _ [name] [address]); Louis Meckel ([facility]), [name] , Owens Loffler ([facility]); Praveen Mannam (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  Patient requests change in treatment schedule to 2 weeks followed by 1 week break. Will give as Day 1 carbo taxol with granix on days 2 and 3, then taxol day 8 with granix day 9 every 21 days.  She has follow up with Dr Prescott Gum on 02-28-16, will have CXR with that visit.  Patient is generally weak, but not worse; she is up no more than 50% of day. She is dizzy at times when up, drank five 8oz cups of fluids yesterday; she continues florinef at 0.05 mg daily which will be increased (see below). She has less heaviness/ discomfort in left lower chest, no increased SOB and no increased cough. She had slight epistaxis yesterday, no other bleeding. She is eating, no nausea. No fever or symptoms of infection. No peripheral neuropathy. No problems with PAC. Taxol aches not severe. Is able to sleep. Remainder of 10 point Review of Systems negative.     PAC in [place] on 12-29-15, ?had IV iron also while in hospital for VATS     ONCOLOGIC HISTORY   Patient had episode of post menopausal vaginal bleeding early 2015. Later in 2015 she had persistent vaginal discharge, for which she was seen by PCP Suzann Hedgecock in ~ 02-2014, had CTs in [facility] and was referred to Dr Adella Nissen, gyn in [facility]. Endometrial biopsy had concern (that path not included in information sent for consultation) such that she was referred to Dr Jacquelyne Balint. Surgery by Dr Sabra Heck at [facility] in [place] on  04-06-14 was TAH, BSO, omentectomy, pelvic and right common iliac node evaluation; patient was hospitalized x 3 days and tells me that she recovered well from the surgery. 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 Bulk of the carcinoma was in lower uterine segment where it was within 0.1 cm of serosal surface. Cytology positive for adenocarcinoma on washings ([id number]). Surgical findings were significant for no paraaortic adenopathy apparent. Patient has received 3 cycles of adjuvant chemotherapy in [facility] by Dr Sabra Heck, on 05-05-14, 05-26-14 and 07-29-14. Per records and patient, delay of cycle 3 was related to low counts and insurance changes. She received neulasta after chemotherapy on 07-29-14. Taxol was given at 175 mg/m2 for total dose was 280 mg cycle 1 and 291 mg for cycles 2 and 3; carboplatin was AUC = 6 with total dose 610 mg cycle 1, 620 mg cycle 2 and 630 mg cycle 3.. She does not recall using oral decadron premed for taxol, with premeds listed on chemo flowsheets standard decadron 20 mg, zofran 16 mg, benadryl 50 mg (which caused severe restless legs) and pepcid 20 mg. Outside information does not include serial blood counts. Last note from Dr Sabra Heck dated 07-29-14 describes unremarkable exam, "NED during chemotherapy and adequate PS". Patient was  see by Dr Denman George 08-09-14, who recommends restaging scans after 6 cycles of chemotherapy and consideration of vaginal brachytherapy; she has not been seen by radiation oncology in [place]. CT AP done in [facility] system 08-15-14 (due to elevated LFTs and blood in urine just after transfer of care) with soft tissue fullness at superior abdominal aorta and  question of retroperitoneal necrotic adenopathy; plan repeat scans after complete present chemotherapy. Cycles 4 -6 carbo taxol given 08-24-14 thru 10-21-14, with gCSF support (last cycle carboplatin only due to peripheral neuropathy in feet). CT AP 12-25-14 without adenopathy or other apparent residual or recurrent malignancy.  Radiation treatment dates: July 19, July 27, July 29, August 9, August 11 Site/dose: Proximal vagina, 30 gray in 5 fractions Patient was hospitalized at [facility] 6-15 thru 12-06-15 after presenting with progressive SOB, with finding of large left pleural effusion. CT chest 11-30-15 showed new enlarged and necrotic right hilar and mediastinal nodes, multiple right pulmonary masses, large left pleural effusion with pleural mass, tiny lucent foci too small to characterize in thoracic vertebrae. She had thoracentesis by pulmonary for 1.5 liters on 11-30-15, then left pleurex catheter placed by IR on 12-04-15 with additional 800 cc removed then. Cytology [id number] adenocarcinoma (appeared serous per pathologist verbal report). CT AP 12-01-15 showed no new or suspicious liver findings, 1.2 cm left periaortic node, large nodal mass left pelvic sidewall 6.2 x 2.9 cm, no ascites or peritoneal changes, no apparent bony findings. She had day 1 cycle 1 dose dense carbo taxol on 01-04-16, with delay for day 8 and day 15 cycle 1.    Anemia: hemoglobin ranged from 8.1 - 12.6 thru chemo, with MCV 103 when I met her after first chemo and up to 109 during treatment. Iron studies 08-2014 had serum iron 43, %sat 22, ferritin 429, B12 >2000 SIEP 10-2014 had no monoclonal protein (elevated IgG and IgA polyclonal). Urinalyses had microscopic hematuria, with cystoscopy by Dr Burman Nieves at [facility] 10-20-2014 unremarkable. Haptoglobin 08-2014 not low (449), LDH normal, total bili occasionally elevated (2-29-26) but often normal. She was transfused 2 units PRBCs 09-2014 for hemoglobin 8.1.  Colonoscopy  by Dr Oretha Caprice [facility] 05-23-15 unable to advance pediatric scope beyond sigmoid colon, which seemed to be fixed    Objective:  Vital signs in last 24 hours:  BP 90/65 (BP Location: Left Arm, Patient Position: Sitting)   Pulse 81   Temp 97.4 F (36.3 C) (Oral)   Resp 17   Ht _0  (1.676 m)   Wt 130 lb 1.6 oz (59 kg)   SpO2 98%   BMI 21.00 kg/m  Weight down 1 lb Alert, oriented and appropriate. In [facility] for office.  Alopecia  HEENT:PERRL, sclerae not icteric. Oral mucosa moist without lesions, posterior pharynx clear.  Neck supple. No JVD.  Lymphatics:no cervical,suraclavicular, axillary or inguinal adenopathy Resp: clear to auscultation bilaterally and normal percussion bilaterally Cardio: regular rate and rhythm. No gallop. GI: soft, nontender, not distended, no mass or organomegaly. Normally active bowel sounds. Surgical incision not remarkable. Musculoskeletal/ Extremities: without pitting edema, cords, tenderness Neuro: no peripheral neuropathy. Otherwise nonfocal Skin without rash, ecchymosis, petechiae Breasts: without dominant mass, skin or nipple findings. Axillae benign. Portacath-without erythema or tenderness  Lab Results:  Results for orders placed or performed in visit on 02/15/16  CBC with Differential  Result Value Ref Range   WBC 4.8 3.9 - 10.3 10e3/uL   NEUT# 2.9 1.5 - 6.5 10e3/uL   HGB 9.1 (L) 11.6 -  15.9 g/dL   HCT 29.1 (L) 34.8 - 46.6 %   Platelets 258 145 - 400 10e3/uL   MCV 95.1 79.5 - 101.0 fL   MCH 29.7 25.1 - 34.0 pg   MCHC 31.3 (L) 31.5 - 36.0 g/dL   RBC 3.06 (L) 3.70 - 5.45 10e6/uL   RDW 19.1 (H) 11.2 - 14.5 %   lymph# 1.5 0.9 - 3.3 10e3/uL   MONO# 0.3 0.1 - 0.9 10e3/uL   Eosinophils Absolute 0.0 0.0 - 0.5 10e3/uL   Basophils Absolute 0.0 0.0 - 0.1 10e3/uL   NEUT% 60.1 38.4 - 76.8 %   LYMPH% 32.2 14.0 - 49.7 %   MONO% 6.9 0.0 - 14.0 %   EOS% 0.6 0.0 - 7.0 %   BASO% 0.2 0.0 - 2.0 %  Comprehensive metabolic panel  Result Value Ref  Range   Sodium 139 136 - 145 mEq/L   Potassium 4.1 3.5 - 5.1 mEq/L   Chloride 102 98 - 109 mEq/L   CO2 28 22 - 29 mEq/L   Glucose 115 70 - 140 mg/dl   BUN 10.5 7.0 - 26.0 mg/dL   Creatinine 0.8 0.6 - 1.1 mg/dL   Total Bilirubin <0.30 0.20 - 1.20 mg/dL   Alkaline Phosphatase 147 40 - 150 U/L   AST 18 5 - 34 U/L   ALT 21 0 - 55 U/L   Total Protein 9.3 (H) 6.4 - 8.3 g/dL   Albumin 2.4 (L) 3.5 - 5.0 g/dL   Calcium 9.7 8.4 - 10.4 mg/dL   Anion Gap 9 3 - 11 mEq/L   EGFR >90 >90 ml/min/1.73 m2     Studies/Results:  No results found.  Medications: I have reviewed the patient's current medications. Increase florinef from 0.05 mg daily to 0.1 mg daily, this begun in hospital for hypotension.  DISCUSSION Medications as above.  Discussed "alkaline water" with daughter, as I had explained to patient previously  We are encouraged that the left chest discomfort is improving, which may be recovery from the VATS/ pleurex related discomfort, or possibly some improvement from chemo.  Change chemo to day 1 carbo taxol/  day 8 taxol every 21 days, granix days 2,3,9.  [place] SW aware of patient's request for possible assistance at home. 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  Note written for daughter,  as she may need to miss some classes in [place] to assist [place]. Daughter may need FMLA papers completed for her work also.  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 Treatment in attempt to palliate and control disease. Will change to day 1 day 8 every 21 days if she is able to tolerate. Granix support. 2.malignant left pleural effusion as well as involvement left pleura and lungs: post left VATS with sclerosis by Dr Prescott Gum 12-22-15, pleurex removed, breathing stable 3.DNR  patient's request. Advance Directives completed.  4.iron deficiency anemia: she had feraheme on 7-14 and possibly another dose in hospital early July. Transfused 2 units PRBCs early Aug. 5.elevated total protein and low albumin: SPEP previously no M spike. 24 hr urine 09-12-15 no M spike. 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 Increase florinef now to 0.1 mg daily  8.PAC in 9. Long past tobacco: DCd 09-2015. Emphysematous changes on CT. Known to [name]  10. Microscopic hematuria: known to Dr Louis Meckel. Urine cytology negative  11.unable to visualize beyond sigmoid on colonoscopy 12-16 due to stricture  12.history substance abuse including ETOH and marijuana 13. long history anxiety and depression 14.cytopenias with previous chemo: granix  All questions answered. Chemo orders and scheduling adjusted. Time spent 30 min including >50% counseling and coordination of care. Cc Dr Hazle Nordmann to update, in EMR for Dr Lucianne Lei Kerby Less   Evlyn Clines, MD   02/15/2016, 7:24 PM

## 2016-02-15 NOTE — Progress Notes (Signed)
Nutrition follow-up completed with patient and daughter, during infusion for metastatic endometrial cancer. Weight decreased and documented as 130 pounds down from 131 pounds. Patient denies nutrition impact symptoms. She is trying to drink oral nutrition supplements and verbalized that she has been told to drink 3 but this is difficult.  Nutrition diagnosis: Inadequate oral intake continues.  Intervention: Provided support and encouragement for patient to increase oral nutrition supplements to 3 daily. Provided additional coupons. Reviewed high-calorie high-protein foods. Questions were answered.  Teach back method used.  Monitoring, evaluation, goals:  Patient will tolerate increased calories and protein to promote weight maintenance.  Next visit: Thursday, September 14 during infusion.  **Disclaimer: This note was dictated with voice recognition software. Similar sounding words can inadvertently be transcribed and this note may contain transcription errors which may not have been corrected upon publication of note.**

## 2016-02-16 ENCOUNTER — Ambulatory Visit (HOSPITAL_BASED_OUTPATIENT_CLINIC_OR_DEPARTMENT_OTHER): Payer: Medicare Other

## 2016-02-16 VITALS — HR 78 | Temp 98.3°F | Resp 20

## 2016-02-16 DIAGNOSIS — C7800 Secondary malignant neoplasm of unspecified lung: Secondary | ICD-10-CM

## 2016-02-16 DIAGNOSIS — Z5189 Encounter for other specified aftercare: Secondary | ICD-10-CM | POA: Diagnosis not present

## 2016-02-16 DIAGNOSIS — C541 Malignant neoplasm of endometrium: Secondary | ICD-10-CM | POA: Diagnosis present

## 2016-02-16 MED ORDER — TBO-FILGRASTIM 300 MCG/0.5ML ~~LOC~~ SOSY
300.0000 ug | PREFILLED_SYRINGE | Freq: Once | SUBCUTANEOUS | Status: AC
  Administered 2016-02-16: 300 ug via SUBCUTANEOUS
  Filled 2016-02-16: qty 0.5

## 2016-02-16 NOTE — Patient Instructions (Signed)

## 2016-02-21 ENCOUNTER — Encounter: Payer: Medicare Other | Admitting: Cardiothoracic Surgery

## 2016-02-22 ENCOUNTER — Other Ambulatory Visit: Payer: Medicare Other

## 2016-02-22 ENCOUNTER — Ambulatory Visit: Payer: Medicare Other

## 2016-02-23 ENCOUNTER — Other Ambulatory Visit: Payer: Self-pay | Admitting: Cardiothoracic Surgery

## 2016-02-23 ENCOUNTER — Encounter: Payer: Medicare Other | Admitting: Cardiothoracic Surgery

## 2016-02-27 ENCOUNTER — Other Ambulatory Visit: Payer: Self-pay | Admitting: Cardiothoracic Surgery

## 2016-02-27 DIAGNOSIS — J910 Malignant pleural effusion: Secondary | ICD-10-CM

## 2016-02-28 ENCOUNTER — Ambulatory Visit
Admission: RE | Admit: 2016-02-28 | Discharge: 2016-02-28 | Disposition: A | Discharge status: Home / Self Care | Payer: Medicare Other | Source: Ambulatory Visit | Attending: Cardiothoracic Surgery | Admitting: Cardiothoracic Surgery

## 2016-02-28 ENCOUNTER — Ambulatory Visit (INDEPENDENT_AMBULATORY_CARE_PROVIDER_SITE_OTHER): Payer: Self-pay | Admitting: Cardiothoracic Surgery

## 2016-02-28 ENCOUNTER — Other Ambulatory Visit: Payer: Self-pay | Admitting: Oncology

## 2016-02-28 ENCOUNTER — Encounter: Payer: Self-pay | Admitting: Cardiothoracic Surgery

## 2016-02-28 VITALS — BP 105/76 | HR 79 | Resp 16 | Ht 66.0 in | Wt 130.0 lb

## 2016-02-28 DIAGNOSIS — J90 Pleural effusion, not elsewhere classified: Secondary | ICD-10-CM

## 2016-02-28 DIAGNOSIS — J910 Malignant pleural effusion: Secondary | ICD-10-CM

## 2016-02-28 DIAGNOSIS — J948 Other specified pleural conditions: Secondary | ICD-10-CM

## 2016-02-28 NOTE — Progress Notes (Signed)
PCP is HEDGECOCK,SUZANNE, PA-C Referring Provider is Ardith Dark, [place]  Chief Complaint  Patient presents with  . Routine Post Op    6 week f/u with CXR    HPI:The patient returns to the office for final follow-up after left Pleurx catheter removal for malignant pleural effusion. She denies ovary symptoms of shortness of breath or cough Chest x-ray today shows small loculated left basilar effusion after removal of Pleurx catheter There is a 2 cm round density in the left mid lung zone consistent with malignancy.   Past Medical History:  Diagnosis Date  . Anemia   . Arthritis   . Blood transfusion without reported diagnosis    2015, 2016  . Constipation   . COPD (chronic obstructive pulmonary disease) ([place])   . Radiation 01/03/15, 01/11/15, 01/13/15, 01/24/15, 01/26/15   proximal vagina 30 gray  . Uterine cancer [facility])    finished chemo May 2016, to start radiation July 2016    Past Surgical History:  Procedure Laterality Date  . CESAREAN SECTION  1991  . CHEST TUBE INSERTION Left 12/22/2015   Procedure: INSERTION PLEURAL DRAINAGE CATHETER;  Surgeon: Ivin Poot, MD;  Location: [place];  Service: Thoracic;  Laterality: Left;  . COLONOSCOPY    . INCISE AND DRAIN ABCESS  1970   scalp  . PLEURADESIS Left 12/22/2015   Procedure: PLEURADESIS;  Surgeon: Ivin Poot, MD;  Location: [place];  Service: Thoracic;  Laterality: Left;  . PLEURAL EFFUSION DRAINAGE Left 12/22/2015   Procedure: DRAINAGE OF PLEURAL EFFUSION;  Surgeon: Ivin Poot, MD;  Location: [place];  Service: Thoracic;  Laterality: Left;  . PORTACATH PLACEMENT  04/26/2014   rt. power port with tip in SVC  . TOTAL ABDOMINAL HYSTERECTOMY W/ BILATERAL SALPINGOOPHORECTOMY  04/06/14   TAH, BSO, omentectomy, pelvic and right common iliac node evaluation  . VIDEO ASSISTED THORACOSCOPY Left 12/22/2015   Procedure: VIDEO ASSISTED THORACOSCOPY;  Surgeon: Ivin Poot, MD;  Location: [facility] OR;  Service: Thoracic;  Laterality:  Left;    Family History  Problem Relation Age of Onset  . Hypertension Mother   . Cancer Father   . Colon cancer Neg Hx   . Esophageal cancer Neg Hx   . Rectal cancer Neg Hx   . Stomach cancer Neg Hx     Social History Social History  Substance Use Topics  . Smoking status: Former Smoker    Packs/day: 1.00    Years: 30.00    Types: Cigarettes    Quit date: 07/16/2013  . Smokeless tobacco: Never Used  . Alcohol use 1.2 - 1.8 oz/week    2 - 3 Glasses of wine per week     Comment: none now    Current Outpatient Prescriptions  Medication Sig Dispense Refill  . Calcium Citrate-Vitamin D (CALCIUM + D PO) Take 1 tablet by mouth daily.    . cholecalciferol (VITAMIN D) 1000 units tablet Take 1,000 Units by mouth daily.    . ferrous sulfate 325 (65 FE) MG tablet Take 325 mg by mouth daily.    . fludrocortisone (FLORINEF) 0.1 MG tablet Take 1 tablet (0.1 mg total) by mouth daily. 30 tablet 2  . Multiple Vitamin (MULTI-VITAMINS) TABS Take 1 tablet by mouth daily.    . naproxen sodium (ANAPROX) 220 MG tablet Take 220 mg by mouth 2 (two) times daily as needed.    . polyethylene glycol (MIRALAX / GLYCOLAX) packet Take 17 g by mouth daily as needed for mild constipation.     Marland Kitchen  PROAIR HFA 108 (90 Base) MCG/ACT inhaler INHALE 1 PUFF BY MOUTH FOUR TIMES DAILY 8.5 g 0  . promethazine (PHENERGAN) 12.5 MG tablet Take 1 tablet (12.5 mg total) by mouth every 6 (six) hours as needed for nausea or vomiting. 30 tablet 0  . traMADol (ULTRAM) 50 MG tablet Take 1 tablet (50 mg total) by mouth every 6 (six) hours as needed. 30 tablet 0   No current facility-administered medications for this visit.    Facility-Administered Medications Ordered in Other Visits  Medication Dose Route Frequency Provider Last Rate Last Dose  . sodium chloride 0.9 % injection 10 mL  10 mL Intravenous PRN Lennis P Livesay, MD   10 mL at 02/15/16 1601    Allergies  Allergen Reactions  . Levaquin [Levofloxacin In D5w] Other  (See Comments)    "I just felt horrible"    Review of Systems  Patient scheduled to receive chemotherapy tomorrow  BP 105/76   Pulse 79   Resp 16   Ht 5\' 6"  (1.676 m)   Wt 130 lb (59 kg)   SpO2 94% Comment: RA  BMI 20.98 kg/m  Physical Exam Pleurx incisions all well-healed Slightly diminished breath sounds at left base Heart rhythm regular, no pericardial rub  Diagnostic Tests: Chest x-ray personally reviewed and counseled with patient  Impression: Pleurx catheter out, incisions healed  Plan: Return as needed for any thoracic surgical issues   Len Childs, MD [facility] [phone]

## 2016-02-29 ENCOUNTER — Encounter: Payer: Medicare Other | Admitting: Nutrition

## 2016-02-29 ENCOUNTER — Other Ambulatory Visit (HOSPITAL_BASED_OUTPATIENT_CLINIC_OR_DEPARTMENT_OTHER): Payer: Medicare Other

## 2016-02-29 ENCOUNTER — Ambulatory Visit: Payer: Medicare Other

## 2016-02-29 ENCOUNTER — Ambulatory Visit (HOSPITAL_BASED_OUTPATIENT_CLINIC_OR_DEPARTMENT_OTHER): Payer: Medicare Other

## 2016-02-29 ENCOUNTER — Encounter: Payer: Self-pay | Admitting: Oncology

## 2016-02-29 ENCOUNTER — Ambulatory Visit (HOSPITAL_BASED_OUTPATIENT_CLINIC_OR_DEPARTMENT_OTHER): Payer: Medicare Other | Admitting: Oncology

## 2016-02-29 VITALS — BP 108/80 | HR 78 | Temp 98.6°F | Resp 18 | Ht 66.0 in | Wt 133.9 lb

## 2016-02-29 DIAGNOSIS — C541 Malignant neoplasm of endometrium: Secondary | ICD-10-CM

## 2016-02-29 DIAGNOSIS — Z95828 Presence of other vascular implants and grafts: Secondary | ICD-10-CM

## 2016-02-29 DIAGNOSIS — G620 Drug-induced polyneuropathy: Secondary | ICD-10-CM | POA: Diagnosis not present

## 2016-02-29 DIAGNOSIS — C7989 Secondary malignant neoplasm of other specified sites: Secondary | ICD-10-CM | POA: Diagnosis not present

## 2016-02-29 DIAGNOSIS — C7802 Secondary malignant neoplasm of left lung: Secondary | ICD-10-CM

## 2016-02-29 DIAGNOSIS — Z5111 Encounter for antineoplastic chemotherapy: Secondary | ICD-10-CM | POA: Diagnosis present

## 2016-02-29 DIAGNOSIS — C782 Secondary malignant neoplasm of pleura: Secondary | ICD-10-CM | POA: Diagnosis not present

## 2016-02-29 DIAGNOSIS — D509 Iron deficiency anemia, unspecified: Secondary | ICD-10-CM

## 2016-02-29 DIAGNOSIS — C7801 Secondary malignant neoplasm of right lung: Secondary | ICD-10-CM

## 2016-02-29 DIAGNOSIS — J910 Malignant pleural effusion: Secondary | ICD-10-CM

## 2016-02-29 DIAGNOSIS — T451X5A Adverse effect of antineoplastic and immunosuppressive drugs, initial encounter: Secondary | ICD-10-CM

## 2016-02-29 DIAGNOSIS — I951 Orthostatic hypotension: Secondary | ICD-10-CM

## 2016-02-29 DIAGNOSIS — D701 Agranulocytosis secondary to cancer chemotherapy: Secondary | ICD-10-CM

## 2016-02-29 LAB — CBC WITH DIFFERENTIAL/PLATELET
BASO%: 0.9 % (ref 0.0–2.0)
Basophils Absolute: 0 10*3/uL (ref 0.0–0.1)
EOS%: 1.5 % (ref 0.0–7.0)
Eosinophils Absolute: 0.1 10*3/uL (ref 0.0–0.5)
HEMATOCRIT: 28.7 % — AB (ref 34.8–46.6)
HEMOGLOBIN: 9 g/dL — AB (ref 11.6–15.9)
LYMPH#: 1.6 10*3/uL (ref 0.9–3.3)
LYMPH%: 46.2 % (ref 14.0–49.7)
MCH: 30.2 pg (ref 25.1–34.0)
MCHC: 31.4 g/dL — AB (ref 31.5–36.0)
MCV: 96.3 fL (ref 79.5–101.0)
MONO#: 0.3 10*3/uL (ref 0.1–0.9)
MONO%: 8.6 % (ref 0.0–14.0)
NEUT#: 1.5 10*3/uL (ref 1.5–6.5)
NEUT%: 42.8 % (ref 38.4–76.8)
Platelets: 244 10*3/uL (ref 145–400)
RBC: 2.98 10*6/uL — ABNORMAL LOW (ref 3.70–5.45)
RDW: 21.3 % — AB (ref 11.2–14.5)
WBC: 3.4 10*3/uL — ABNORMAL LOW (ref 3.9–10.3)

## 2016-02-29 LAB — COMPREHENSIVE METABOLIC PANEL
ALT: 11 U/L (ref 0–55)
AST: 12 U/L (ref 5–34)
Albumin: 2.5 g/dL — ABNORMAL LOW (ref 3.5–5.0)
Alkaline Phosphatase: 120 U/L (ref 40–150)
Anion Gap: 9 mEq/L (ref 3–11)
BUN: 9.2 mg/dL (ref 7.0–26.0)
CALCIUM: 9.4 mg/dL (ref 8.4–10.4)
CHLORIDE: 106 meq/L (ref 98–109)
CO2: 27 mEq/L (ref 22–29)
CREATININE: 0.7 mg/dL (ref 0.6–1.1)
EGFR: >90 mL/min/{1.73_m2} (ref >90)
GLUCOSE: 109 mg/dL (ref 70–140)
POTASSIUM: 3.8 meq/L (ref 3.5–5.1)
SODIUM: 141 meq/L (ref 136–145)
Total Bilirubin: <0.3 mg/dL (ref 0.20–1.20)
Total Protein: 8.9 g/dL — ABNORMAL HIGH (ref 6.4–8.3)

## 2016-02-29 MED ORDER — SODIUM CHLORIDE 0.9 % IJ SOLN
10.0000 mL | INTRAMUSCULAR | Status: DC | PRN
  Administered 2016-02-29: 10 mL via INTRAVENOUS
  Filled 2016-02-29: qty 10

## 2016-02-29 MED ORDER — CARBOPLATIN CHEMO INTRADERMAL TEST DOSE 100MCG/0.02ML
100.0000 ug | Freq: Once | INTRADERMAL | Status: AC
  Administered 2016-02-29: 100 ug via INTRADERMAL
  Filled 2016-02-29: qty 0.01

## 2016-02-29 MED ORDER — SODIUM CHLORIDE 0.9 % IV SOLN
490.5000 mg | Freq: Once | INTRAVENOUS | Status: AC
  Administered 2016-02-29: 490 mg via INTRAVENOUS
  Filled 2016-02-29: qty 49

## 2016-02-29 MED ORDER — DIPHENHYDRAMINE HCL 50 MG/ML IJ SOLN
25.0000 mg | Freq: Once | INTRAMUSCULAR | Status: AC
  Administered 2016-02-29: 25 mg via INTRAVENOUS

## 2016-02-29 MED ORDER — PALONOSETRON HCL INJECTION 0.25 MG/5ML
0.2500 mg | Freq: Once | INTRAVENOUS | Status: AC
  Administered 2016-02-29: 0.25 mg via INTRAVENOUS

## 2016-02-29 MED ORDER — PALONOSETRON HCL INJECTION 0.25 MG/5ML
INTRAVENOUS | Status: AC
  Filled 2016-02-29: qty 5

## 2016-02-29 MED ORDER — DIPHENHYDRAMINE HCL 50 MG/ML IJ SOLN
INTRAMUSCULAR | Status: AC
  Filled 2016-02-29: qty 1

## 2016-02-29 MED ORDER — SODIUM CHLORIDE 0.9 % IV SOLN
80.0000 mg/m2 | Freq: Once | INTRAVENOUS | Status: AC
  Administered 2016-02-29: 138 mg via INTRAVENOUS
  Filled 2016-02-29: qty 23

## 2016-02-29 MED ORDER — SODIUM CHLORIDE 0.9% FLUSH
10.0000 mL | INTRAVENOUS | Status: DC | PRN
  Administered 2016-02-29: 10 mL
  Filled 2016-02-29: qty 10

## 2016-02-29 MED ORDER — SODIUM CHLORIDE 0.9 % IV SOLN
20.0000 mg | Freq: Once | INTRAVENOUS | Status: AC
  Administered 2016-02-29: 20 mg via INTRAVENOUS
  Filled 2016-02-29: qty 2

## 2016-02-29 MED ORDER — FAMOTIDINE IN NACL 20-0.9 MG/50ML-% IV SOLN
20.0000 mg | Freq: Once | INTRAVENOUS | Status: AC
  Administered 2016-02-29: 20 mg via INTRAVENOUS

## 2016-02-29 MED ORDER — TRAMADOL HCL 50 MG PO TABS: 50.0000 mg | ORAL_TABLET | Freq: Four times a day (QID) | ORAL | 0 refills | Status: DC | PRN

## 2016-02-29 MED ORDER — FAMOTIDINE IN NACL 20-0.9 MG/50ML-% IV SOLN
INTRAVENOUS | Status: AC
  Filled 2016-02-29: qty 50

## 2016-02-29 MED ORDER — HEPARIN SOD (PORK) LOCK FLUSH 100 UNIT/ML IV SOLN
500.0000 [IU] | Freq: Once | INTRAVENOUS | Status: AC | PRN
  Administered 2016-02-29: 500 [IU]
  Filled 2016-02-29: qty 5

## 2016-02-29 MED ORDER — SODIUM CHLORIDE 0.9 % IV SOLN
Freq: Once | INTRAVENOUS | Status: AC
  Administered 2016-02-29: 16:00:00 via INTRAVENOUS

## 2016-02-29 NOTE — Patient Instructions (Signed)
[facility] Discharge Instructions for Patients Receiving Chemotherapy  Today you received the following chemotherapy agents Taxol and Carboplatin. To help prevent nausea and vomiting after your treatment, we encourage you to take your nausea medication as directed.  If you develop nausea and vomiting that is not controlled by your nausea medication, call the clinic.   BELOW ARE SYMPTOMS THAT SHOULD BE REPORTED IMMEDIATELY:  *FEVER GREATER THAN 100.5 F  *CHILLS WITH OR WITHOUT FEVER  NAUSEA AND VOMITING THAT IS NOT CONTROLLED WITH YOUR NAUSEA MEDICATION  *UNUSUAL SHORTNESS OF BREATH  *UNUSUAL BRUISING OR BLEEDING  TENDERNESS IN MOUTH AND THROAT WITH OR WITHOUT PRESENCE OF ULCERS  *URINARY PROBLEMS  *BOWEL PROBLEMS  UNUSUAL RASH Items with * indicate a potential emergency and should be followed up as soon as possible.  Feel free to call the clinic you have any questions or concerns. The clinic phone number is [phone].  Please show the CHEMO ALERT CARD at check-in to the Emergency Department and triage nurse.    

## 2016-02-29 NOTE — Progress Notes (Signed)
Chemo Skin test appears red and slightly raised. Selena Lesser NP to infusion to assess and Per Selena Lesser NP okay to proceed with treatment, and that skin test does not appear to be a reaction.

## 2016-02-29 NOTE — Patient Instructions (Signed)

## 2016-02-29 NOTE — Progress Notes (Signed)
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 She is due day 1 carbo taxol today. Patient had follow up with Dr Prescott Gum on 02-28-16, CXR with residual small left basilar effusion since removal of pleurex, right lung field with improvement in mets, still 2.7 cm left mid lung density.  Dr Prescott Gum will see her again as needed.    Patient has felt particularly well in past week, with appetite much better and weight gain even without supplements. She denies SOB, chest pain, cough. Energy and activity tolerance has been better since increase in Florinef at last visit. Chest discomfort now is improved, now just "a tight band" below left breast. She denies fever, abdominal or pelvic pain, LE swelling, bleeding, problems with PAC. Bowels are moving well.  No worsening peripheral neuropathy back on taxol Remainder of 10 point Review of Systems negative  PAC in Feraheme on 12-29-15, ?had IV iron also while in hospital for VATS   ONCOLOGIC HISTORY  Patient had episode of post menopausal vaginal bleeding early 2015. Later in 2015 she had persistent vaginal discharge, for which she was seen by PCP Suzann Hedgecock in ~ 02-2014, had CTs in [facility] and was referred to Dr Adella Nissen, gyn in [facility]. Endometrial biopsy had concern (that path not included in information sent for consultation) such that she was referred to Dr Jacquelyne Balint. Surgery by Dr Sabra Heck at [facility] in [place] on 04-06-14 was TAH,  BSO, omentectomy, pelvic and right common iliac node evaluation; patient was hospitalized x 3 days and tells me that she recovered well from the surgery. 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 Bulk of the carcinoma was in lower uterine segment where it was within 0.1 cm of serosal surface. Cytology positive for adenocarcinoma on washings ([id number]). Surgical findings were significant for no paraaortic adenopathy apparent. Patient has received 3 cycles of adjuvant chemotherapy in [facility] by Dr Sabra Heck, on 05-05-14, 05-26-14 and 07-29-14. Per records and patient, delay of cycle 3 was related to low counts and insurance changes. She received neulasta after chemotherapy on 07-29-14. Taxol was given at 175 mg/m2 for total dose was 280 mg cycle 1 and 291 mg for cycles 2 and 3; carboplatin was AUC = 6 with total dose 610 mg cycle 1, 620 mg cycle 2 and 630 mg cycle 3.. She does not recall using oral decadron premed for taxol, with premeds listed on chemo flowsheets standard decadron 20 mg, zofran 16 mg, benadryl 50 mg (which caused severe restless legs) and pepcid 20 mg. Outside information does not include serial blood counts. Last note from Dr Sabra Heck dated 07-29-14 describes unremarkable exam, "NED during chemotherapy and adequate PS". Patient was see by Dr Denman George 08-09-14, who recommends restaging scans after 6 cycles of chemotherapy  and consideration of vaginal brachytherapy; she has not been seen by radiation oncology in [place]. CT AP done in [facility] system 08-15-14 (due to elevated LFTs and blood in urine just after transfer of care) with soft tissue fullness at superior abdominal aorta and question of  retroperitoneal necrotic adenopathy; plan repeat scans after complete present chemotherapy. Cycles 4 -6 carbo taxol given 08-24-14 thru 10-21-14, with gCSF support (last cycle carboplatin only due to peripheral neuropathy in feet). CT AP 12-25-14 without adenopathy or other apparent residual or recurrent malignancy.  Radiation treatment dates: July 19, July 27, July 29, August 9, August 11 Site/dose: Proximal vagina, 30 gray in 5 fractions Patient was hospitalized at [facility] 6-15 thru 12-06-15 after presenting with progressive SOB, with finding of large left pleural effusion. CT chest 11-30-15 showed new enlarged and necrotic right hilar and mediastinal nodes, multiple right pulmonary masses, large left pleural effusion with pleural mass, tiny lucent foci too small to characterize in thoracic vertebrae. She had thoracentesis by pulmonary for 1.5 liters on 11-30-15, then left pleurex catheter placed by IR on 12-04-15 with additional 800 cc removed then. Cytology [id number] adenocarcinoma (appeared serous per pathologist verbal report). CT AP 12-01-15 showed no new or suspicious liver findings, 1.2 cm left periaortic node, large nodal mass left pelvic sidewall 6.2 x 2.9 cm, no ascites or peritoneal changes, no apparent bony findings. She had day 1 cycle 1 dose dense carbo taxol on 01-04-16, with delay for day 8 and day 15 cycle 1.    Anemia: hemoglobin ranged from 8.1 - 12.6 thru chemo, with MCV 103 when I met her after first chemo and up to 109 during treatment. Iron studies 08-2014 had serum iron 43, %sat 22, ferritin 429, B12 >2000 SIEP 10-2014 had no monoclonal protein (elevated IgG and IgA polyclonal). Urinalyses had microscopic hematuria, with cystoscopy by Dr Burman Nieves at [facility] 10-20-2014 unremarkable. Haptoglobin 08-2014 not low (449), LDH normal, total bili occasionally elevated (2-29-26) but often normal. She was transfused 2 units PRBCs 09-2014 for hemoglobin 8.1.  Colonoscopy by Dr Oretha Caprice [facility] 05-23-15 unable to advance pediatric scope beyond sigmoid colon, which seemed to be fixed    Objective:  Vital signs in last 24 hours:  BP 108/80 (BP Location: Left Arm, Patient Position: Sitting)   Pulse 78   Temp 98.6 F (37 C) (Oral)   Resp 18   Ht 5' 6"  (1.676 m)   Wt 133 lb 14.4 oz (60.7 kg)   SpO2 100%   BMI 21.61 kg/m   Improved BP noted Weight up 4 lbs Alert, oriented and appropriate. Ambulatory without assistance.  Alopecia  HEENT:PERRL, sclerae not icteric. Oral mucosa moist without lesions, posterior pharynx clear.  Neck supple. No JVD.  Lymphatics:no cervical,supraclavicular or inguinal adenopathy Resp: clear to auscultation bilaterally and normal percussion bilaterally Cardio: regular rate and rhythm. No gallop. GI: soft, scaphoid, nontender, not distended, no mass or organomegaly. Normally active bowel sounds. Surgical incision not remarkable. Musculoskeletal/ Extremities: without pitting edema, cords, tenderness Neuro: no increase peripheral neuropathy. Otherwise nonfocal. PSYCH appropriate mood and affect Skin without rash, ecchymosis, petechiae Portacath-without erythema or tenderness  Lab Results:  Results for orders placed or performed in visit on 02/29/16  CBC with Differential  Result Value Ref Range   WBC 3.4 (L) 3.9 - 10.3 10e3/uL   NEUT# 1.5 1.5 - 6.5 10e3/uL   HGB 9.0 (L) 11.6 - 15.9 g/dL   HCT 28.7 (L) 34.8 - 46.6 %   Platelets  244 145 - 400 10e3/uL   MCV 96.3 79.5 - 101.0 fL   MCH 30.2 25.1 - 34.0 pg   MCHC 31.4 (L) 31.5 - 36.0 g/dL   RBC 2.98 (L) 3.70 - 5.45 10e6/uL   RDW 21.3 (H) 11.2 - 14.5 %   lymph# 1.6 0.9 - 3.3 10e3/uL   MONO# 0.3 0.1 - 0.9 10e3/uL   Eosinophils Absolute 0.1 0.0 - 0.5 10e3/uL   Basophils Absolute 0.0 0.0 - 0.1 10e3/uL   NEUT% 42.8 38.4 - 76.8 %   LYMPH% 46.2 14.0 - 49.7 %   MONO% 8.6 0.0 - 14.0 %   EOS% 1.5 0.0 - 7.0 %   BASO% 0.9 0.0 - 2.0 %  Comprehensive metabolic panel  Result Value Ref  Range   Sodium 141 136 - 145 mEq/L   Potassium 3.8 3.5 - 5.1 mEq/L   Chloride 106 98 - 109 mEq/L   CO2 27 22 - 29 mEq/L   Glucose 109 70 - 140 mg/dl   BUN 9.2 7.0 - 26.0 mg/dL   Creatinine 0.7 0.6 - 1.1 mg/dL   Total Bilirubin <0.30 0.20 - 1.20 mg/dL   Alkaline Phosphatase 120 40 - 150 U/L   AST 12 5 - 34 U/L   ALT 11 0 - 55 U/L   Total Protein 8.9 (H) 6.4 - 8.3 g/dL   Albumin 2.5 (L) 3.5 - 5.0 g/dL   Calcium 9.4 8.4 - 10.4 mg/dL   Anion Gap 9 3 - 11 mEq/L   EGFR >90 >90 ml/min/1.73 m2     Studies/Results:  Dg Chest 2 View  Result Date: 02/28/2016 CLINICAL DATA:  Malignant pleural effusion.  No chest complaints. EXAM: CHEST  2 VIEW COMPARISON:  01/10/2016 FINDINGS: There is a 2.7 cm left mid lung pulmonary nodule. There is a small left pleural effusion with basilar airspace disease. The right lung is clear. There is no pneumothorax. The heart and mediastinal contours are unremarkable. There is a right-sided Port-A-Cath in satisfactory position. The osseous structures are unremarkable. IMPRESSION: 2.7 cm left mid lung pulmonary nodule concerning for metastatic disease. Overall decrease in the number of metastatic nodules seen in the right lung. Small left pleural effusion with basilar airspace disease. Electronically Signed   By: Kathreen Devoid   On: 02/28/2016 14:42  PACs images reviewed by MD.  Medications: I have reviewed the patient's current medications. Has felt better with increased dose of florinef, now 0.1 mg,  which she will continue.   DISCUSSION CXR information reviewed with patient. She understands that CT is more sensitive than CXR, but improvement noted. .  Patient is delighted that she is feeling so much better and in agreement with continuing same chemo schedule.   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 Treatment in attempt to palliate and control disease, does dense carbo taxol day 1 day 8 every 21 days already better tolerated. Granix support. 2.malignant left pleural effusion as well as involvement left pleura and lungs: post left VATS with sclerosis by Dr Prescott Gum 12-22-15. Chest pain and CXR improved, prn follow up with Dr Prescott Gum.  3.DNR patient's request. Advance Directives completed.  4.iron deficiency anemia: she had feraheme  on 7-14 and possibly another dose in hospital early July. Transfused 2 units PRBCs early Aug. 5.elevated total protein and low albumin: SPEP previously no M spike. 24 hr urine 09-12-15 no M spike. 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 Continue to push fluids  8.PAC in 9. Long past tobacco: DCd 09-2015. Emphysematous changes on CT. Known to [name]  10. Microscopic hematuria: known to Dr Louis Meckel. Urine cytology negative  11.unable to visualize beyond sigmoid on colonoscopy 12-16 due to stricture  12.history substance abuse including ETOH and marijuana 13. long history anxiety and depression 14.cytopenias with previous chemo: granix as above. NOTE may want to try change to 480 mcg days 2 and 9  All questions answered and patient knows to call if needed between scheduled appointments. Chemo and granix orders confirmed. Time spent 25 min including >50% counseling and coordination of care.     Evlyn Clines, MD   02/29/2016, 3:23 PM

## 2016-03-01 ENCOUNTER — Ambulatory Visit (HOSPITAL_BASED_OUTPATIENT_CLINIC_OR_DEPARTMENT_OTHER): Payer: Medicare Other

## 2016-03-01 VITALS — BP 98/72 | HR 75 | Temp 98.0°F | Resp 17

## 2016-03-01 DIAGNOSIS — Z5189 Encounter for other specified aftercare: Secondary | ICD-10-CM

## 2016-03-01 DIAGNOSIS — C782 Secondary malignant neoplasm of pleura: Secondary | ICD-10-CM

## 2016-03-01 DIAGNOSIS — C7989 Secondary malignant neoplasm of other specified sites: Secondary | ICD-10-CM | POA: Diagnosis not present

## 2016-03-01 DIAGNOSIS — C541 Malignant neoplasm of endometrium: Secondary | ICD-10-CM | POA: Diagnosis present

## 2016-03-01 DIAGNOSIS — C7802 Secondary malignant neoplasm of left lung: Secondary | ICD-10-CM

## 2016-03-01 DIAGNOSIS — C7801 Secondary malignant neoplasm of right lung: Secondary | ICD-10-CM

## 2016-03-01 MED ORDER — TBO-FILGRASTIM 300 MCG/0.5ML ~~LOC~~ SOSY
300.0000 ug | PREFILLED_SYRINGE | Freq: Once | SUBCUTANEOUS | Status: AC
  Administered 2016-03-01: 300 ug via SUBCUTANEOUS
  Filled 2016-03-01: qty 0.5

## 2016-03-01 NOTE — Patient Instructions (Signed)

## 2016-03-02 ENCOUNTER — Ambulatory Visit (HOSPITAL_BASED_OUTPATIENT_CLINIC_OR_DEPARTMENT_OTHER): Payer: Medicare Other

## 2016-03-02 VITALS — BP 103/70 | HR 73 | Temp 97.8°F | Resp 18

## 2016-03-02 DIAGNOSIS — C541 Malignant neoplasm of endometrium: Secondary | ICD-10-CM | POA: Diagnosis not present

## 2016-03-02 MED ORDER — TBO-FILGRASTIM 300 MCG/0.5ML ~~LOC~~ SOSY
300.0000 ug | PREFILLED_SYRINGE | Freq: Once | SUBCUTANEOUS | Status: AC
  Administered 2016-03-02: 300 ug via SUBCUTANEOUS

## 2016-03-02 NOTE — Patient Instructions (Signed)

## 2016-03-05 ENCOUNTER — Encounter: Payer: Self-pay | Admitting: General Practice

## 2016-03-05 NOTE — Progress Notes (Signed)
[facility] Spiritual Care Note  Spoke with pt by phone to provide a spiritual check-in per referral from Dr Marko Plume for building tools to help cope with anxiety.  Plan to consult with counseling intern Rosana Fret regarding strategies for complementary care and then to f/u with Lavone in infusion Thursday 9/21 to practice progressive relaxation and/or another mindfulness exercise to help with self-soothing in infusion, as pt identifies that infusion room is a difficult place for her to relax.   [place], [place], [facility] Pager [phone] Voicemail [phone]

## 2016-03-07 ENCOUNTER — Encounter: Payer: Self-pay | Admitting: General Practice

## 2016-03-07 ENCOUNTER — Ambulatory Visit: Payer: Medicare Other

## 2016-03-07 ENCOUNTER — Other Ambulatory Visit: Payer: Self-pay | Admitting: Oncology

## 2016-03-07 ENCOUNTER — Other Ambulatory Visit (HOSPITAL_BASED_OUTPATIENT_CLINIC_OR_DEPARTMENT_OTHER): Payer: Medicare Other

## 2016-03-07 ENCOUNTER — Ambulatory Visit (HOSPITAL_BASED_OUTPATIENT_CLINIC_OR_DEPARTMENT_OTHER): Payer: Medicare Other

## 2016-03-07 VITALS — BP 101/71 | HR 72 | Temp 98.4°F | Resp 18

## 2016-03-07 DIAGNOSIS — C7802 Secondary malignant neoplasm of left lung: Secondary | ICD-10-CM

## 2016-03-07 DIAGNOSIS — C541 Malignant neoplasm of endometrium: Secondary | ICD-10-CM

## 2016-03-07 DIAGNOSIS — Z95828 Presence of other vascular implants and grafts: Secondary | ICD-10-CM

## 2016-03-07 DIAGNOSIS — C782 Secondary malignant neoplasm of pleura: Secondary | ICD-10-CM | POA: Diagnosis not present

## 2016-03-07 DIAGNOSIS — C7801 Secondary malignant neoplasm of right lung: Secondary | ICD-10-CM

## 2016-03-07 DIAGNOSIS — Z5189 Encounter for other specified aftercare: Secondary | ICD-10-CM | POA: Diagnosis not present

## 2016-03-07 DIAGNOSIS — C7989 Secondary malignant neoplasm of other specified sites: Secondary | ICD-10-CM | POA: Diagnosis not present

## 2016-03-07 LAB — COMPREHENSIVE METABOLIC PANEL
ALBUMIN: 2.7 g/dL — AB (ref 3.5–5.0)
ALK PHOS: 117 U/L (ref 40–150)
ALT: 11 U/L (ref 0–55)
ANION GAP: 6 meq/L (ref 3–11)
AST: 14 U/L (ref 5–34)
BUN: 14 mg/dL (ref 7.0–26.0)
CALCIUM: 9.3 mg/dL (ref 8.4–10.4)
CO2: 28 mEq/L (ref 22–29)
Chloride: 106 mEq/L (ref 98–109)
Creatinine: 0.7 mg/dL (ref 0.6–1.1)
GLUCOSE: 89 mg/dL (ref 70–140)
POTASSIUM: 4.2 meq/L (ref 3.5–5.1)
SODIUM: 140 meq/L (ref 136–145)
TOTAL PROTEIN: 8.6 g/dL — AB (ref 6.4–8.3)

## 2016-03-07 LAB — CBC WITH DIFFERENTIAL/PLATELET
BASO%: 1 % (ref 0.0–2.0)
BASOS ABS: 0 10*3/uL (ref 0.0–0.1)
EOS ABS: 0.1 10*3/uL (ref 0.0–0.5)
EOS%: 2.9 % (ref 0.0–7.0)
HCT: 27.6 % — ABNORMAL LOW (ref 34.8–46.6)
HEMOGLOBIN: 9 g/dL — AB (ref 11.6–15.9)
LYMPH%: 47.1 % (ref 14.0–49.7)
MCH: 31.4 pg (ref 25.1–34.0)
MCHC: 32.6 g/dL (ref 31.5–36.0)
MCV: 96.4 fL (ref 79.5–101.0)
MONO#: 0.3 10*3/uL (ref 0.1–0.9)
MONO%: 8.2 % (ref 0.0–14.0)
NEUT%: 40.8 % (ref 38.4–76.8)
NEUTROS ABS: 1.3 10*3/uL — AB (ref 1.5–6.5)
PLATELETS: 198 10*3/uL (ref 145–400)
RBC: 2.86 10*6/uL — ABNORMAL LOW (ref 3.70–5.45)
RDW: 24.1 % — AB (ref 11.2–14.5)
WBC: 3.3 10*3/uL — ABNORMAL LOW (ref 3.9–10.3)
lymph#: 1.5 10*3/uL (ref 0.9–3.3)

## 2016-03-07 MED ORDER — SODIUM CHLORIDE 0.9 % IJ SOLN
10.0000 mL | INTRAMUSCULAR | Status: DC | PRN
Start: 2016-03-07 — End: 2016-03-07
  Administered 2016-03-07: 10 mL via INTRAVENOUS
  Filled 2016-03-07: qty 10

## 2016-03-07 MED ORDER — SODIUM CHLORIDE 0.9 % IJ SOLN
10.0000 mL | INTRAMUSCULAR | Status: DC | PRN
  Administered 2016-03-07: 10 mL via INTRAVENOUS
  Filled 2016-03-07: qty 10

## 2016-03-07 MED ORDER — HEPARIN SOD (PORK) LOCK FLUSH 100 UNIT/ML IV SOLN
500.0000 [IU] | Freq: Once | INTRAVENOUS | Status: AC | PRN
  Administered 2016-03-07: 500 [IU] via INTRAVENOUS
  Filled 2016-03-07: qty 5

## 2016-03-07 MED ORDER — TBO-FILGRASTIM 300 MCG/0.5ML ~~LOC~~ SOSY
300.0000 ug | PREFILLED_SYRINGE | Freq: Once | SUBCUTANEOUS | Status: AC
  Administered 2016-03-07: 300 ug via SUBCUTANEOUS
  Filled 2016-03-07: qty 0.5

## 2016-03-07 NOTE — Progress Notes (Signed)
Per Dr. Marko Plume, pt to hold treatment d/t ANC.  Pt to receive granix injection today in place of tomorrow.  Appt cancelled and pt informed of change.  Reviewed neutropenic precautions with pt who verbalized understanding.  No further questions or concerns at discharge.

## 2016-03-07 NOTE — Progress Notes (Signed)
ANC 1.3 today. Dr. Marko Plume to postpone tx and add granix. Reinforced neutropenic precautions. Pt verbalized understanding. AVS printed. Pt supplied with masks.

## 2016-03-07 NOTE — Patient Instructions (Addendum)
Neutropenia Neutropenia is a condition that occurs when the level of a certain type of white blood cell (neutrophil) in your body becomes lower than normal. Neutrophils are made in the bone marrow and fight infections. These cells protect against bacteria and viruses. The fewer neutrophils you have, and the longer your body remains without them, the greater your risk of getting a severe infection becomes. CAUSES  The cause of neutropenia may be hard to determine. However, it is usually due to 3 main problems:   Decreased production of neutrophils. This may be due to:  Certain medicines such as chemotherapy.  Genetic problems.  Cancer.  Radiation treatments.  Vitamin deficiency.  Some pesticides.  Increased destruction of neutrophils. This may be due to:  Overwhelming infections.  Hemolytic anemia. This is when the body destroys its own blood cells.  Chemotherapy.  Neutrophils moving to areas of the body where they cannot fight infections. This may be due to:  Dialysis procedures.  Conditions where the spleen becomes enlarged. Neutrophils are held in the spleen and are not available to the rest of the body.  Overwhelming infections. The neutrophils are held in the area of the infection and are not available to the rest of the body. SYMPTOMS  There are no specific symptoms of neutropenia. The lack of neutrophils can result in an infection, and an infection can cause various problems. DIAGNOSIS  Diagnosis is made by a blood test. A complete blood count is performed. The normal level of neutrophils in human blood differs with age and race. Infants have lower counts than older children and adults. African Americans have lower counts than Caucasians or Asians. The average adult level is 1500 cells/mm3 of blood. Neutrophil counts are interpreted as follows:  Greater than 1000 cells/mm3 gives normal protection against infection.  500 to 1000 cells/mm3 gives an increased risk for  infection.  200 to 500 cells/mm3 is a greater risk for severe infection.  Lower than 200 cells/mm3 is a marked risk of infection. This may require hospitalization and treatment with antibiotic medicines. TREATMENT  Treatment depends on the underlying cause, severity, and presence of infections or symptoms. It also depends on your health. Your caregiver will discuss the treatment plan with you. Mild cases are often easily treated and have a good outcome. Preventative measures may also be started to limit your risk of infections. Treatment can include:  Taking antibiotics.  Stopping medicines that are known to cause neutropenia.  Correcting nutritional deficiencies by eating green vegetables to supply folic acid and taking vitamin B supplements.  Stopping exposure to pesticides if your neutropenia is related to pesticide exposure.  Taking a blood growth factor called sargramostim, pegfilgrastim, or filgrastim if you are undergoing chemotherapy for cancer. This stimulates white blood cell production.  Removal of the spleen if you have Felty's syndrome and have repeated infections. HOME CARE INSTRUCTIONS   Follow your caregiver's instructions about when you need to have blood work done.  Wash your hands often. Make sure others who come in contact with you also wash their hands.  Wash raw fruits and vegetables before eating them. They can carry bacteria and fungi.  Avoid people with colds or spreadable (contagious) diseases (chickenpox, herpes zoster, influenza).  Avoid large crowds.  Avoid construction areas. The dust can release fungus into the air.  Be cautious around children in daycare or school environments.  Take care of your respiratory system by coughing and deep breathing.  Bathe daily.  Protect your skin from cuts and   burns.  Do not work in the garden or with flowers and plants.  Care for the mouth before and after meals by brushing with a soft toothbrush. If you have  mucositis, do not use mouthwash. Mouthwash contains alcohol and can dry out the mouth even more.  Clean the area between the genitals and the anus (perineal area) after urination and bowel movements. Women need to wipe from front to back.  Use a water soluble lubricant during sexual intercourse and practice good hygiene after. Do not have intercourse if you are severely neutropenic. Check with your caregiver for guidelines.  Exercise daily as tolerated.  Avoid people who were vaccinated with a live vaccine in the past 30 days. You should not receive live vaccines (polio, typhoid).  Do not provide direct care for pets. Avoid animal droppings. Do not clean litter boxes and bird cages.  Do not share food utensils.  Do not use tampons, enemas, or rectal suppositories unless directed by your caregiver.  Use an electric razor to remove hair.  Wash your hands after handling magazines, letters, and newspapers. SEEK IMMEDIATE MEDICAL CARE IF:   You have a fever.  You have chills or start to shake.  You feel nauseous or vomit.  You develop mouth sores.  You develop aches and pains.  You have redness and swelling around open wounds.  Your skin is warm to the touch.  You have pus coming from your wounds.  You develop swollen lymph nodes.  You feel weak or fatigued.  You develop red streaks on the skin. MAKE SURE YOU:  Understand these instructions.  Will watch your condition.  Will get help right away if you are not doing well or get worse.   This information is not intended to replace advice given to you by your health care provider. Make sure you discuss any questions you have with your health care provider.   Document Released: 11/23/2001 Document Revised: 08/26/2011 Document Reviewed: 12/14/2014 Elsevier Interactive Patient Education 2016 Elsevier Inc. Tbo-Filgrastim injection What is this medicine? TBO-FILGRASTIM (T B O fil GRA stim) is a granulocyte  colony-stimulating factor that stimulates the growth of neutrophils, a type of white blood cell important in the body's fight against infection. It is used to reduce the incidence of fever and infection in patients with certain types of cancer who are receiving chemotherapy that affects the bone marrow. This medicine may be used for other purposes; ask your health care provider or pharmacist if you have questions. What should I tell my health care provider before I take this medicine? They need to know if you have any of these conditions: -ongoing radiation therapy -sickle cell anemia -an unusual or allergic reaction to tbo-filgrastim, filgrastim, pegfilgrastim, other medicines, foods, dyes, or preservatives -pregnant or trying to get pregnant -breast-feeding How should I use this medicine? This medicine is for injection under the skin. If you get this medicine at home, you will be taught how to prepare and give this medicine. Refer to the Instructions for Use that come with your medication packaging. Use exactly as directed. Take your medicine at regular intervals. Do not take your medicine more often than directed. It is important that you put your used needles and syringes in a special sharps container. Do not put them in a trash can. If you do not have a sharps container, call your pharmacist or healthcare provider to get one. Talk to your pediatrician regarding the use of this medicine in children. Special care may be   needed. Overdosage: If you think you have taken too much of this medicine contact a poison control center or emergency room at once. NOTE: This medicine is only for you. Do not share this medicine with others. What if I miss a dose? It is important not to miss your dose. Call your doctor or health care professional if you miss a dose. What may interact with this medicine? This medicine may interact with the following medications: -medicines that may cause a release of  neutrophils, such as lithium This list may not describe all possible interactions. Give your health care provider a list of all the medicines, herbs, non-prescription drugs, or dietary supplements you use. Also tell them if you smoke, drink alcohol, or use illegal drugs. Some items may interact with your medicine. What should I watch for while using this medicine? You may need blood work done while you are taking this medicine. What side effects may I notice from receiving this medicine? Side effects that you should report to your doctor or health care professional as soon as possible: -allergic reactions like skin rash, itching or hives, swelling of the face, lips, or tongue -shortness of breath or breathing problems -fever -pain, redness, or irritation at site where injected -pinpoint red spots on the skin -stomach or side pain, or pain at the shoulder -swelling -tiredness -trouble passing urine Side effects that usually do not require medical attention (Report these to your doctor or health care professional if they continue or are bothersome.): -bone pain -muscle pain This list may not describe all possible side effects. Call your doctor for medical advice about side effects. You may report side effects to FDA at 1-800-FDA-1088. Where should I keep my medicine? Keep out of the reach of children. Store in a refrigerator between 2 and 8 degrees C (36 and 46 degrees F). Keep in carton to protect from light. Throw away this medicine if it is left out of the refrigerator for more than 5 consecutive days. Throw away any unused medicine after the expiration date. NOTE: This sheet is a summary. It may not cover all possible information. If you have questions about this medicine, talk to your doctor, pharmacist, or health care provider.    2016, Elsevier/Gold Standard. (2013-09-23 11:52:29)  

## 2016-03-07 NOTE — Progress Notes (Signed)
[place] Spiritual Care Note  Met with Sheena Caldwell in infusion as planned, bringing several mindfulness/reflection activities to share with her.  Also provided pastoral presence, reflective listening, and affirmation of her other tools (such as meditative coloring, which has been meaningful for her in the past).    Consulted with counseling intern Sheena Caldwell, who has been following pt.  Will defer to [facility] for emotional support, which has been very valuable to pt.  Sheena Caldwell both aware of Spiritual Care availability for consultation or f/u support as needs arise.   [place], [place], [facility] Pager [phone] Voicemail [phone]

## 2016-03-08 ENCOUNTER — Ambulatory Visit: Payer: Medicare Other

## 2016-03-09 ENCOUNTER — Ambulatory Visit: Payer: Medicare Other

## 2016-03-17 ENCOUNTER — Other Ambulatory Visit: Payer: Self-pay | Admitting: Oncology

## 2016-03-19 ENCOUNTER — Ambulatory Visit (HOSPITAL_COMMUNITY)
Admission: RE | Admit: 2016-03-19 | Discharge: 2016-03-19 | Disposition: A | Discharge status: Home / Self Care | Payer: Medicare Other | Source: Ambulatory Visit | Attending: Oncology | Admitting: Oncology

## 2016-03-19 DIAGNOSIS — R590 Localized enlarged lymph nodes: Secondary | ICD-10-CM | POA: Diagnosis not present

## 2016-03-19 DIAGNOSIS — I700 Atherosclerosis of aorta: Secondary | ICD-10-CM | POA: Diagnosis not present

## 2016-03-19 DIAGNOSIS — J90 Pleural effusion, not elsewhere classified: Secondary | ICD-10-CM | POA: Insufficient documentation

## 2016-03-19 DIAGNOSIS — C541 Malignant neoplasm of endometrium: Secondary | ICD-10-CM | POA: Diagnosis not present

## 2016-03-19 MED ORDER — IOPAMIDOL (ISOVUE-300) INJECTION 61%
100.0000 mL | Freq: Once | INTRAVENOUS | Status: AC | PRN
  Administered 2016-03-19: 100 mL via INTRAVENOUS

## 2016-03-21 ENCOUNTER — Ambulatory Visit (HOSPITAL_BASED_OUTPATIENT_CLINIC_OR_DEPARTMENT_OTHER): Payer: Medicare Other | Admitting: Oncology

## 2016-03-21 ENCOUNTER — Ambulatory Visit: Payer: Medicare Other

## 2016-03-21 ENCOUNTER — Ambulatory Visit (HOSPITAL_BASED_OUTPATIENT_CLINIC_OR_DEPARTMENT_OTHER): Payer: Medicare Other

## 2016-03-21 ENCOUNTER — Other Ambulatory Visit: Payer: Medicaid Other

## 2016-03-21 ENCOUNTER — Encounter: Payer: Self-pay | Admitting: Oncology

## 2016-03-21 ENCOUNTER — Ambulatory Visit: Payer: Medicaid Other | Admitting: Oncology

## 2016-03-21 ENCOUNTER — Other Ambulatory Visit (HOSPITAL_BASED_OUTPATIENT_CLINIC_OR_DEPARTMENT_OTHER): Payer: Medicare Other

## 2016-03-21 ENCOUNTER — Ambulatory Visit: Payer: Medicare Other | Admitting: Nutrition

## 2016-03-21 VITALS — BP 117/75 | HR 85 | Temp 98.7°F | Resp 18 | Ht 66.0 in | Wt 138.4 lb

## 2016-03-21 DIAGNOSIS — C541 Malignant neoplasm of endometrium: Secondary | ICD-10-CM

## 2016-03-21 DIAGNOSIS — Z5111 Encounter for antineoplastic chemotherapy: Secondary | ICD-10-CM

## 2016-03-21 DIAGNOSIS — C782 Secondary malignant neoplasm of pleura: Secondary | ICD-10-CM | POA: Diagnosis not present

## 2016-03-21 DIAGNOSIS — G620 Drug-induced polyneuropathy: Secondary | ICD-10-CM

## 2016-03-21 DIAGNOSIS — R3129 Other microscopic hematuria: Secondary | ICD-10-CM

## 2016-03-21 DIAGNOSIS — I959 Hypotension, unspecified: Secondary | ICD-10-CM

## 2016-03-21 DIAGNOSIS — D701 Agranulocytosis secondary to cancer chemotherapy: Secondary | ICD-10-CM | POA: Diagnosis not present

## 2016-03-21 DIAGNOSIS — C7801 Secondary malignant neoplasm of right lung: Secondary | ICD-10-CM

## 2016-03-21 DIAGNOSIS — Z95828 Presence of other vascular implants and grafts: Secondary | ICD-10-CM

## 2016-03-21 DIAGNOSIS — D509 Iron deficiency anemia, unspecified: Secondary | ICD-10-CM

## 2016-03-21 DIAGNOSIS — C7802 Secondary malignant neoplasm of left lung: Secondary | ICD-10-CM | POA: Diagnosis not present

## 2016-03-21 DIAGNOSIS — T451X5A Adverse effect of antineoplastic and immunosuppressive drugs, initial encounter: Secondary | ICD-10-CM

## 2016-03-21 DIAGNOSIS — C7989 Secondary malignant neoplasm of other specified sites: Secondary | ICD-10-CM | POA: Diagnosis not present

## 2016-03-21 DIAGNOSIS — J910 Malignant pleural effusion: Secondary | ICD-10-CM

## 2016-03-21 DIAGNOSIS — I951 Orthostatic hypotension: Secondary | ICD-10-CM

## 2016-03-21 DIAGNOSIS — F418 Other specified anxiety disorders: Secondary | ICD-10-CM | POA: Diagnosis not present

## 2016-03-21 LAB — CBC WITH DIFFERENTIAL/PLATELET
BASO%: 0.3 % (ref 0.0–2.0)
Basophils Absolute: 0 10*3/uL (ref 0.0–0.1)
EOS ABS: 0.1 10*3/uL (ref 0.0–0.5)
EOS%: 2.2 % (ref 0.0–7.0)
HEMATOCRIT: 30.1 % — AB (ref 34.8–46.6)
HEMOGLOBIN: 9.7 g/dL — AB (ref 11.6–15.9)
LYMPH#: 1.4 10*3/uL (ref 0.9–3.3)
LYMPH%: 43.4 % (ref 14.0–49.7)
MCH: 32.3 pg (ref 25.1–34.0)
MCHC: 32.2 g/dL (ref 31.5–36.0)
MCV: 100.3 fL (ref 79.5–101.0)
MONO#: 0.3 10*3/uL (ref 0.1–0.9)
MONO%: 8.3 % (ref 0.0–14.0)
NEUT%: 45.8 % (ref 38.4–76.8)
NEUTROS ABS: 1.5 10*3/uL (ref 1.5–6.5)
Platelets: 156 10*3/uL (ref 145–400)
RBC: 3 10*6/uL — ABNORMAL LOW (ref 3.70–5.45)
RDW: 24.6 % — AB (ref 11.2–14.5)
WBC: 3.3 10*3/uL — AB (ref 3.9–10.3)

## 2016-03-21 LAB — COMPREHENSIVE METABOLIC PANEL
ALBUMIN: 2.9 g/dL — AB (ref 3.5–5.0)
ALK PHOS: 118 U/L (ref 40–150)
ALT: <9 U/L (ref 0–55)
AST: 14 U/L (ref 5–34)
Anion Gap: 7 mEq/L (ref 3–11)
BUN: 9.5 mg/dL (ref 7.0–26.0)
CALCIUM: 9.3 mg/dL (ref 8.4–10.4)
CO2: 26 mEq/L (ref 22–29)
Chloride: 108 mEq/L (ref 98–109)
Creatinine: 0.7 mg/dL (ref 0.6–1.1)
GLUCOSE: 83 mg/dL (ref 70–140)
Potassium: 3.9 mEq/L (ref 3.5–5.1)
SODIUM: 142 meq/L (ref 136–145)
TOTAL PROTEIN: 8.5 g/dL — AB (ref 6.4–8.3)

## 2016-03-21 MED ORDER — DIPHENHYDRAMINE HCL 50 MG/ML IJ SOLN
INTRAMUSCULAR | Status: AC
  Filled 2016-03-21: qty 1

## 2016-03-21 MED ORDER — FAMOTIDINE IN NACL 20-0.9 MG/50ML-% IV SOLN
INTRAVENOUS | Status: AC
  Filled 2016-03-21: qty 50

## 2016-03-21 MED ORDER — PALONOSETRON HCL INJECTION 0.25 MG/5ML
INTRAVENOUS | Status: AC
  Filled 2016-03-21: qty 5

## 2016-03-21 MED ORDER — PALONOSETRON HCL INJECTION 0.25 MG/5ML
0.2500 mg | Freq: Once | INTRAVENOUS | Status: AC
  Administered 2016-03-21: 0.25 mg via INTRAVENOUS

## 2016-03-21 MED ORDER — FAMOTIDINE IN NACL 20-0.9 MG/50ML-% IV SOLN
20.0000 mg | Freq: Once | INTRAVENOUS | Status: AC
  Administered 2016-03-21: 20 mg via INTRAVENOUS

## 2016-03-21 MED ORDER — SODIUM CHLORIDE 0.9 % IV SOLN
Freq: Once | INTRAVENOUS | Status: AC
  Administered 2016-03-21: 13:00:00 via INTRAVENOUS

## 2016-03-21 MED ORDER — CARBOPLATIN CHEMO INJECTION 450 MG/45ML
392.4000 mg | Freq: Once | INTRAVENOUS | Status: AC
  Administered 2016-03-21: 390 mg via INTRAVENOUS
  Filled 2016-03-21: qty 39

## 2016-03-21 MED ORDER — SODIUM CHLORIDE 0.9 % IV SOLN
20.0000 mg | Freq: Once | INTRAVENOUS | Status: AC
  Administered 2016-03-21: 20 mg via INTRAVENOUS
  Filled 2016-03-21: qty 2

## 2016-03-21 MED ORDER — HEPARIN SOD (PORK) LOCK FLUSH 100 UNIT/ML IV SOLN
500.0000 [IU] | Freq: Once | INTRAVENOUS | Status: AC | PRN
  Administered 2016-03-21: 500 [IU]
  Filled 2016-03-21: qty 5

## 2016-03-21 MED ORDER — CARBOPLATIN CHEMO INTRADERMAL TEST DOSE 100MCG/0.02ML
100.0000 ug | Freq: Once | INTRADERMAL | Status: AC
  Administered 2016-03-21: 100 ug via INTRADERMAL
  Filled 2016-03-21: qty 0.01

## 2016-03-21 MED ORDER — SODIUM CHLORIDE 0.9 % IJ SOLN
10.0000 mL | INTRAMUSCULAR | Status: DC | PRN
  Administered 2016-03-21: 10 mL via INTRAVENOUS
  Filled 2016-03-21: qty 10

## 2016-03-21 MED ORDER — DIPHENHYDRAMINE HCL 50 MG/ML IJ SOLN
25.0000 mg | Freq: Once | INTRAMUSCULAR | Status: AC
  Administered 2016-03-21: 25 mg via INTRAVENOUS

## 2016-03-21 MED ORDER — SODIUM CHLORIDE 0.9% FLUSH
10.0000 mL | INTRAVENOUS | Status: DC | PRN
  Administered 2016-03-21: 10 mL
  Filled 2016-03-21: qty 10

## 2016-03-21 MED ORDER — SODIUM CHLORIDE 0.9 % IV SOLN
80.0000 mg/m2 | Freq: Once | INTRAVENOUS | Status: AC
  Administered 2016-03-21: 138 mg via INTRAVENOUS
  Filled 2016-03-21: qty 23

## 2016-03-21 NOTE — Progress Notes (Signed)
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 CT CAP 03-19-16 shows good partial response thruout and  no new disease. She is to see Dr Jacquelyne Balint on 04-03-16.  Patient feels very well overall. Appetite is excellent, weight up 5 lbs. No SOB or significant cough. Very little left lateral chest discomfort now from previous pleurex and VATS. No orthostatic symptoms on present Florinef 0.1 mg daily.No abdominal or pelvic pain. Bowels fine. No fever or symptoms of infection. No problems with PAC. No worsening of peripheral neuropathy. She does have restless legs with chemo related to benadryl, and also restless legs now at hs since she has been using OTC sleep aid, which likely includes benadryl. Note she is no longer using tramadol at hs "I did not want to get hooked"; she may try melatonin. Remainder of 10 point Review of Systems negative.   PAC in [place] on 12-29-15, ?had IV iron also while in hospital for VATS   ONCOLOGIC HISTORY Patient had episode of post menopausal vaginal bleeding early 2015. Later in 2015 she had persistent vaginal discharge, for which she was seen by PCP Suzann Hedgecock in ~ 02-2014, had CTs in [facility] and was referred to Dr Adella Nissen, gyn in [facility]. Endometrial biopsy had concern (that path not included in information sent for consultation) such that she was referred to Dr Jacquelyne Balint. Surgery by Dr Sabra Heck at [facility]  in [place] on 04-06-14 was TAH, BSO, omentectomy, pelvic and right common iliac node evaluation; patient was hospitalized x 3 days and tells me that she recovered well from the surgery. 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 Bulk of the carcinoma was in lower uterine segment where it was within 0.1 cm of serosal surface. Cytology positive for adenocarcinoma on washings ([id number]). Surgical findings were significant for no paraaortic adenopathy apparent. Patient has received 3 cycles of adjuvant chemotherapy in [facility] by Dr Sabra Heck, on 05-05-14, 05-26-14 and 07-29-14. Per records and patient, delay of cycle 3 was related to low counts and insurance changes. She received neulasta after chemotherapy on 07-29-14. Taxol was given at 175 mg/m2 for total dose was 280 mg cycle 1 and 291 mg for cycles 2 and 3; carboplatin was AUC = 6 with total dose 610 mg cycle 1, 620 mg cycle 2 and 630 mg cycle 3.. She does not recall using oral decadron premed for taxol, with premeds listed on chemo flowsheets standard decadron 20 mg, zofran 16 mg, benadryl 50 mg (which caused severe restless legs) and pepcid 20 mg. Outside information does not include serial blood counts. Last note from Dr Sabra Heck dated 07-29-14 describes unremarkable exam, "NED during chemotherapy and adequate PS". Patient was see by Dr Denman George  08-09-14, who recommends restaging scans after 6 cycles of chemotherapy and consideration of vaginal brachytherapy; she has not been seen by radiation oncology in [place]. CT AP done in [facility] system 08-15-14 (due to elevated LFTs and blood in urine just after transfer of care) with soft tissue fullness at superior  abdominal aorta and question of retroperitoneal necrotic adenopathy; plan repeat scans after complete present chemotherapy. Cycles 4 -6 carbo taxol given 08-24-14 thru 10-21-14, with gCSF support (last cycle carboplatin only due to peripheral neuropathy in feet). CT AP 12-25-14 without adenopathy or other apparent residual or recurrent malignancy.  Radiation treatment dates: July 19, July 27, July 29, August 9, August 11 Site/dose: Proximal vagina, 30 gray in 5 fractions Patient was hospitalized at [facility] 6-15 thru 12-06-15 after presenting with progressive SOB, with finding of large left pleural effusion. CT chest 11-30-15 showed new enlarged and necrotic right hilar and mediastinal nodes, multiple right pulmonary masses, large left pleural effusion with pleural mass, tiny lucent foci too small to characterize in thoracic vertebrae. She had thoracentesis by pulmonary for 1.5 liters on 11-30-15, then left pleurex catheter placed by IR on 12-04-15 with additional 800 cc removed then. Cytology [id number] adenocarcinoma (appeared serous per pathologist verbal report). CT AP 12-01-15 showed no new or suspicious liver findings, 1.2 cm left periaortic node, large nodal mass left pelvic sidewall 6.2 x 2.9 cm, no ascites or peritoneal changes, no apparent bony findings. She had day 1 cycle 1 dose dense carbo taxol on 01-04-16, with delay for counts and symptoms prior to changing regimen to day 1 day 8 every 21 days. CT CAP 03-19-16 showed improvement after 3 cycles.    Anemia: hemoglobin ranged from 8.1 - 12.6 thru chemo, with MCV 103 when I met her after first chemo and up to 109 during treatment. Iron studies 08-2014 had serum iron 43, %sat 22, ferritin 429, B12 >2000 SIEP 10-2014 had no monoclonal protein (elevated IgG and IgA polyclonal). Urinalyses had microscopic hematuria, with cystoscopy by Dr Burman Nieves at [facility] 10-20-2014 unremarkable. Haptoglobin 08-2014 not low (449), LDH normal, total bili  occasionally elevated (2-29-26) but often normal. She was transfused 2 units PRBCs 09-2014 for hemoglobin 8.1.  Colonoscopy by Dr Oretha Caprice [facility] 05-23-15 unable to advance pediatric scope beyond sigmoid colon, which seemed to be fixed  Objective:  Vital signs in last 24 hours:  BP 117/75 (BP Location: Left Arm, Patient Position: Sitting)   Pulse 85   Temp 98.7 F (37.1 C) (Oral)   Resp 18   Ht _0  (1.676 m)   Wt 138 lb 6.4 oz (62.8 kg)   SpO2 97%   BMI 22.34 kg/m  Weight up 5 lbs. This is best BP that she has had. Alert, oriented and appropriate. Ambulatory without difficulty. Looks comfortable, respirations not labored RA  HEENT:PERRL, sclerae not icteric. Oral mucosa moist without lesions, posterior pharynx clear.  Neck supple. No JVD.  Lymphatics:no cervical,supraclavicular or inguinal adenopathy Resp: BS heard to bases bilaterally and no dullness to percussion bilaterally Cardio: regular rate and rhythm. No gallop. GI: soft, nontender, not distended, no mass or organomegaly. Normally active bowel sounds. Surgical incision not remarkable. Musculoskeletal/ Extremities: without pitting edema, cords, tenderness Neuro: no peripheral neuropathy. Otherwise nonfocal Skin without rash, ecchymosis, petechiae Portacath-without erythema or tenderness  Lab Results:  Results for orders placed or performed in visit on 03/21/16  CBC with Differential  Result Value Ref Range   WBC 3.3 (L) 3.9 - 10.3 10e3/uL  NEUT# 1.5 1.5 - 6.5 10e3/uL   HGB 9.7 (L) 11.6 - 15.9 g/dL   HCT 30.1 (L) 34.8 - 46.6 %   Platelets 156 145 - 400 10e3/uL   MCV 100.3 79.5 - 101.0 fL   MCH 32.3 25.1 - 34.0 pg   MCHC 32.2 31.5 - 36.0 g/dL   RBC 3.00 (L) 3.70 - 5.45 10e6/uL   RDW 24.6 (H) 11.2 - 14.5 %   lymph# 1.4 0.9 - 3.3 10e3/uL   MONO# 0.3 0.1 - 0.9 10e3/uL   Eosinophils Absolute 0.1 0.0 - 0.5 10e3/uL   Basophils Absolute 0.0 0.0 - 0.1 10e3/uL   NEUT% 45.8 38.4 - 76.8 %   LYMPH% 43.4 14.0 - 49.7  %   MONO% 8.3 0.0 - 14.0 %   EOS% 2.2 0.0 - 7.0 %   BASO% 0.3 0.0 - 2.0 %  Comprehensive metabolic panel  Result Value Ref Range   Sodium 142 136 - 145 mEq/L   Potassium 3.9 3.5 - 5.1 mEq/L   Chloride 108 98 - 109 mEq/L   CO2 26 22 - 29 mEq/L   Glucose 83 70 - 140 mg/dl   BUN 9.5 7.0 - 26.0 mg/dL   Creatinine 0.7 0.6 - 1.1 mg/dL   Total Bilirubin <0.30 0.20 - 1.20 mg/dL   Alkaline Phosphatase 118 40 - 150 U/L   AST 14 5 - 34 U/L   ALT <9 0 - 55 U/L   Total Protein 8.5 (H) 6.4 - 8.3 g/dL   Albumin 2.9 (L) 3.5 - 5.0 g/dL   Calcium 9.3 8.4 - 10.4 mg/dL   Anion Gap 7 3 - 11 mEq/L   EGFR >90 >90 ml/min/1.73 m2     Studies/Results: : CT CHEST FINDINGS  Cardiovascular: The heart size is normal. There is no pericardial effusion. Calcification involving the LAD coronary artery noted.  Mediastinum/Nodes: The trachea appears patent and is midline. Normal appearance of the esophagus. The index right infrahilar lymph node measures 1.4 cm, previously 2 cm.  Lungs/Pleura: Decrease in volume of left pleural effusion. Multifocal parenchymal and and subpleural nodules are identified involving both lungs. Index lesion within the right lower lobe measures 1.9 cm, image 120 of series 4. Previously 3.1 cm. The index nodule within the right lower lobe measures 4 mm, image 80 of series 4. Previously 1.2 cm. Right upper lobe perifissural nodule measures 9 mm, image 72 series 4. Previously 1.3 cm. Pre-vascular subpleural mass within the anterior medial left upper lobe measures 1.6 cm in thickness, image 24 of series 2. Previously 2.3 cm. Index left lower lobe subpleural nodule measures 1.6 cm, image 105 of series 4. Previously 2.0 cm.  Musculoskeletal: No chest wall mass or suspicious bone lesions identified.  CT ABDOMEN PELVIS FINDINGS  Hepatobiliary: No focal liver abnormality. The gallbladder appears normal. No biliary dilatation.  Pancreas: Unremarkable. No pancreatic ductal  dilatation or surrounding inflammatory changes.  Spleen: Normal in size without focal abnormality.  Adrenals/Urinary Tract: Normal appearance of the adrenal glands. Bilateral renal cysts noted. No mass or hydronephrosis. The urinary bladder is unremarkable.  Stomach/Bowel: The stomach is normal. The small bowel loops are unremarkable. The appendix is visualized and appears normal. No dilated loops of small or large bowel.  Vascular/Lymphatic: Calcified atherosclerotic disease involves the abdominal aorta. No aneurysm. The index left periaortic node measures 7 mm, image 79 of series 2. Previously 1.2 cm. The index left pelvic sidewall lymph node measures 2.1 cm, image 103 of series 2. Previously 2.9 cm.  Reproductive: Status post hysterectomy. No adnexal masses.  Other: No abdominal wall hernia or abnormality. No abdominopelvic ascites.  Musculoskeletal: No acute or significant osseous findings.  IMPRESSION: 1. Interval response to therapy. 2. Decrease in volume of left pleural effusion. The index lesions within the chest have decreased in size in the interval. 3. Interval decrease in size of retroperitoneal and left pelvic side wall adenopathy. 4. Aortic atherosclerosis.   Medications: I have reviewed the patient's current medications. Recommended stopping OTC sleep med with diphenhydramine.  Carbo dose decreased for day 1 cycle 4 today due to cytopenias. Continue granix and day 1 day 8 q 21 day schedule. Carbo skin tests prior to chemo  IV benadryl premed decreased to 12.5 mg starting day 8 cycle 4 due to restless legs and no taxol reactions to date.  Needs flu vaccine - if flu vaccine available at Dr Ammie Ferrier visit, that time would be good relative to chemo; if not able to get at [facility] visit,  can be done at this office ~ 04-05-16.  DISCUSSION CT information reviewed, PACs images reviewed, copy of written report given to patient to take to Dr Sabra Heck (will  also send this note to Dr Sabra Heck with report above).  Patient is very willing to continue present chemo  Flu vaccine as above.   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 Treatment in attempt to palliate and control disease, does dense carbo taxol day 1 day 8 every 21 days, Granix support.Tolerating well and responding by CT. She will keep scheduled visit with Dr Sabra Heck 04-03-16.  2.malignant left pleural effusion as well as involvement left pleura and lungs: post left VATS with sclerosis by Dr Prescott Gum 12-22-15. Improved, prn follow up with Dr Prescott Gum.  3.DNR patient's request. Advance Directives completed.  4.iron deficiency anemia: she had feraheme on 7-14 and possibly another dose in hospital early July. Transfused 2 units PRBCs early Aug. 5.elevated total protein and low albumin: SPEP previously no M spike. 24 hr urine 09-12-15 no M spike. 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 Continue to push fluids  8.PAC in 9. Long past tobacco: DCd 09-2015. Emphysematous changes on CT. Known to [name]  10. Microscopic hematuria: known to Dr Louis Meckel. Urine cytology negative  11.unable to visualize beyond sigmoid on colonoscopy 12-16 due to stricture  12.history substance abuse including ETOH and marijuana 13. long history anxiety and depression. [place] chaplain and counseling intern involved - thank you 14.chemo cytopenias: carbo dose adjusted, using granix 480 days 2,3, 9 15. Restless legs with benadryl: as above  I will see her coordinating with treatment in ~ 3 weeks; she knows to call if needed prior. Chemo, premed and granix orders placed. Time spent 25 min including >50% counseling and  coordination of care Route PCP, cc Dr Hazle Nordmann  Evlyn Clines, MD   03/23/2016, 10:55 AM

## 2016-03-21 NOTE — Progress Notes (Signed)
Nutrition follow-up completed with patient during infusion for metastatic endometrial cancer. Patient reports she feels much better and is proud of her weight gain. Weight improved documented as 138.4 pounds October 5 increased from 130 pounds. Patient denies nutrition impact symptoms. She continues to drink oral nutrition supplements 1 time a day and is requesting coupons.  Nutrition diagnosis: Inadequate oral intake has improved.  Intervention: Educated patient to continue strategies for increased oral intake including 1 nutrition supplement daily. Provided additional coupons. Questions were answered.  Teach back method used.  Monitoring, evaluation, goals: Patient will continue to tolerate increased calories and protein to promote weight maintenance/gain.  Next visit: Thursday, October 26 during infusion.  **Disclaimer: This note was dictated with voice recognition software. Similar sounding words can inadvertently be transcribed and this note may contain transcription errors which may not have been corrected upon publication of note.**

## 2016-03-22 ENCOUNTER — Ambulatory Visit (HOSPITAL_BASED_OUTPATIENT_CLINIC_OR_DEPARTMENT_OTHER): Payer: Medicare Other

## 2016-03-22 VITALS — BP 134/80 | HR 74 | Temp 98.0°F | Resp 20

## 2016-03-22 DIAGNOSIS — C541 Malignant neoplasm of endometrium: Secondary | ICD-10-CM

## 2016-03-22 DIAGNOSIS — C782 Secondary malignant neoplasm of pleura: Secondary | ICD-10-CM | POA: Diagnosis not present

## 2016-03-22 DIAGNOSIS — C7801 Secondary malignant neoplasm of right lung: Secondary | ICD-10-CM | POA: Diagnosis not present

## 2016-03-22 DIAGNOSIS — C7802 Secondary malignant neoplasm of left lung: Secondary | ICD-10-CM | POA: Diagnosis not present

## 2016-03-22 DIAGNOSIS — D701 Agranulocytosis secondary to cancer chemotherapy: Secondary | ICD-10-CM | POA: Diagnosis not present

## 2016-03-22 MED ORDER — TBO-FILGRASTIM 300 MCG/0.5ML ~~LOC~~ SOSY
300.0000 ug | PREFILLED_SYRINGE | Freq: Once | SUBCUTANEOUS | Status: AC
  Administered 2016-03-22: 300 ug via SUBCUTANEOUS
  Filled 2016-03-22: qty 0.5

## 2016-03-22 NOTE — Patient Instructions (Signed)

## 2016-03-23 ENCOUNTER — Ambulatory Visit (HOSPITAL_BASED_OUTPATIENT_CLINIC_OR_DEPARTMENT_OTHER): Payer: Medicare Other

## 2016-03-23 DIAGNOSIS — C541 Malignant neoplasm of endometrium: Secondary | ICD-10-CM | POA: Diagnosis not present

## 2016-03-23 DIAGNOSIS — D701 Agranulocytosis secondary to cancer chemotherapy: Secondary | ICD-10-CM

## 2016-03-23 MED ORDER — TBO-FILGRASTIM 300 MCG/0.5ML ~~LOC~~ SOSY
300.0000 ug | PREFILLED_SYRINGE | Freq: Once | SUBCUTANEOUS | Status: AC
  Administered 2016-03-23: 300 ug via SUBCUTANEOUS

## 2016-03-23 NOTE — Patient Instructions (Signed)

## 2016-03-28 ENCOUNTER — Other Ambulatory Visit (HOSPITAL_BASED_OUTPATIENT_CLINIC_OR_DEPARTMENT_OTHER): Payer: Medicare Other

## 2016-03-28 ENCOUNTER — Ambulatory Visit (HOSPITAL_BASED_OUTPATIENT_CLINIC_OR_DEPARTMENT_OTHER): Payer: Medicare Other

## 2016-03-28 VITALS — BP 109/74 | HR 62 | Temp 98.6°F | Resp 18

## 2016-03-28 DIAGNOSIS — Z5111 Encounter for antineoplastic chemotherapy: Secondary | ICD-10-CM

## 2016-03-28 DIAGNOSIS — C7989 Secondary malignant neoplasm of other specified sites: Secondary | ICD-10-CM

## 2016-03-28 DIAGNOSIS — C7801 Secondary malignant neoplasm of right lung: Secondary | ICD-10-CM

## 2016-03-28 DIAGNOSIS — C541 Malignant neoplasm of endometrium: Secondary | ICD-10-CM | POA: Diagnosis not present

## 2016-03-28 DIAGNOSIS — C7802 Secondary malignant neoplasm of left lung: Secondary | ICD-10-CM | POA: Diagnosis not present

## 2016-03-28 DIAGNOSIS — C782 Secondary malignant neoplasm of pleura: Secondary | ICD-10-CM | POA: Diagnosis not present

## 2016-03-28 LAB — CBC WITH DIFFERENTIAL/PLATELET
BASO%: 0.5 % (ref 0.0–2.0)
BASOS ABS: 0 10*3/uL (ref 0.0–0.1)
EOS%: 1.9 % (ref 0.0–7.0)
Eosinophils Absolute: 0.1 10*3/uL (ref 0.0–0.5)
HEMATOCRIT: 31 % — AB (ref 34.8–46.6)
HGB: 10.2 g/dL — ABNORMAL LOW (ref 11.6–15.9)
LYMPH#: 1.6 10*3/uL (ref 0.9–3.3)
LYMPH%: 42.9 % (ref 14.0–49.7)
MCH: 33 pg (ref 25.1–34.0)
MCHC: 32.9 g/dL (ref 31.5–36.0)
MCV: 100.3 fL (ref 79.5–101.0)
MONO#: 0.3 10*3/uL (ref 0.1–0.9)
MONO%: 7.9 % (ref 0.0–14.0)
NEUT#: 1.7 10*3/uL (ref 1.5–6.5)
NEUT%: 46.8 % (ref 38.4–76.8)
PLATELETS: 146 10*3/uL (ref 145–400)
RBC: 3.09 10*6/uL — AB (ref 3.70–5.45)
RDW: 24.2 % — ABNORMAL HIGH (ref 11.2–14.5)
WBC: 3.7 10*3/uL — ABNORMAL LOW (ref 3.9–10.3)

## 2016-03-28 LAB — COMPREHENSIVE METABOLIC PANEL
ALT: 24 U/L (ref 0–55)
ANION GAP: 11 meq/L (ref 3–11)
AST: 25 U/L (ref 5–34)
Albumin: 3.1 g/dL — ABNORMAL LOW (ref 3.5–5.0)
Alkaline Phosphatase: 120 U/L (ref 40–150)
BUN: 14.6 mg/dL (ref 7.0–26.0)
CALCIUM: 9.7 mg/dL (ref 8.4–10.4)
CHLORIDE: 104 meq/L (ref 98–109)
CO2: 23 meq/L (ref 22–29)
CREATININE: 0.7 mg/dL (ref 0.6–1.1)
Glucose: 140 mg/dl (ref 70–140)
POTASSIUM: 3.9 meq/L (ref 3.5–5.1)
Sodium: 139 mEq/L (ref 136–145)
Total Bilirubin: 0.5 mg/dL (ref 0.20–1.20)
Total Protein: 9.3 g/dL — ABNORMAL HIGH (ref 6.4–8.3)

## 2016-03-28 MED ORDER — PALONOSETRON HCL INJECTION 0.25 MG/5ML
INTRAVENOUS | Status: AC
  Filled 2016-03-28: qty 5

## 2016-03-28 MED ORDER — SODIUM CHLORIDE 0.9 % IV SOLN
Freq: Once | INTRAVENOUS | Status: AC
  Administered 2016-03-28: 10:00:00 via INTRAVENOUS

## 2016-03-28 MED ORDER — SODIUM CHLORIDE 0.9 % IV SOLN
INTRAVENOUS | Status: DC
  Administered 2016-03-28: 11:00:00 via INTRAVENOUS

## 2016-03-28 MED ORDER — FAMOTIDINE IN NACL 20-0.9 MG/50ML-% IV SOLN
INTRAVENOUS | Status: AC
  Filled 2016-03-28: qty 50

## 2016-03-28 MED ORDER — PACLITAXEL CHEMO INJECTION 300 MG/50ML
80.0000 mg/m2 | Freq: Once | INTRAVENOUS | Status: AC
  Administered 2016-03-28: 138 mg via INTRAVENOUS
  Filled 2016-03-28: qty 23

## 2016-03-28 MED ORDER — FAMOTIDINE IN NACL 20-0.9 MG/50ML-% IV SOLN
20.0000 mg | Freq: Once | INTRAVENOUS | Status: AC
  Administered 2016-03-28: 20 mg via INTRAVENOUS

## 2016-03-28 MED ORDER — SODIUM CHLORIDE 0.9% FLUSH
10.0000 mL | INTRAVENOUS | Status: DC | PRN
  Administered 2016-03-28: 10 mL via INTRAVENOUS
  Filled 2016-03-28: qty 10

## 2016-03-28 MED ORDER — SODIUM CHLORIDE 0.9 % IV SOLN
20.0000 mg | Freq: Once | INTRAVENOUS | Status: AC
  Administered 2016-03-28: 20 mg via INTRAVENOUS
  Filled 2016-03-28: qty 2

## 2016-03-28 MED ORDER — HEPARIN SOD (PORK) LOCK FLUSH 100 UNIT/ML IV SOLN
500.0000 [IU] | Freq: Once | INTRAVENOUS | Status: AC
  Administered 2016-03-28: 500 [IU] via INTRAVENOUS
  Filled 2016-03-28: qty 5

## 2016-03-28 MED ORDER — DIPHENHYDRAMINE HCL 50 MG/ML IJ SOLN
12.5000 mg | Freq: Once | INTRAMUSCULAR | Status: AC
  Administered 2016-03-28: 12.5 mg via INTRAVENOUS

## 2016-03-28 MED ORDER — DIPHENHYDRAMINE HCL 50 MG/ML IJ SOLN
INTRAMUSCULAR | Status: AC
  Filled 2016-03-28: qty 1

## 2016-03-28 NOTE — Progress Notes (Signed)
Dr. Marko Plume notified of BP_87/64 and 80/55 this AM.  Patient states that she did take her Florinef this AM.  Patient denies any nausea, vomiting or diarrhea.  Order received to give patient additional 500 ml NS over one hour.

## 2016-03-28 NOTE — Patient Instructions (Addendum)
[facility] Discharge Instructions for Patients Receiving Chemotherapy  Today you received the following chemotherapy agents: Taxol.  To help prevent nausea and vomiting after your treatment, we encourage you to take your nausea medication as directed.    If you develop nausea and vomiting that is not controlled by your nausea medication, call the clinic.   BELOW ARE SYMPTOMS THAT SHOULD BE REPORTED IMMEDIATELY:  *FEVER GREATER THAN 100.5 F  *CHILLS WITH OR WITHOUT FEVER  NAUSEA AND VOMITING THAT IS NOT CONTROLLED WITH YOUR NAUSEA MEDICATION  *UNUSUAL SHORTNESS OF BREATH  *UNUSUAL BRUISING OR BLEEDING  TENDERNESS IN MOUTH AND THROAT WITH OR WITHOUT PRESENCE OF ULCERS  *URINARY PROBLEMS  *BOWEL PROBLEMS  UNUSUAL RASH Items with * indicate a potential emergency and should be followed up as soon as possible.  Feel free to call the clinic you have any questions or concerns. The clinic phone number is [phone].  Please show the [facility] at check-in to the Emergency Department and triage nurse.   Dehydration, Adult Dehydration is a condition in which you do not have enough fluid or water in your body. It happens when you take in less fluid than you lose. Vital organs such as the kidneys, brain, and heart cannot function without a proper amount of fluids. Any loss of fluids from the body can cause dehydration.  Dehydration can range from mild to severe. This condition should be treated right away to help prevent it from becoming severe. CAUSES  This condition may be caused by:  Vomiting.  Diarrhea.  Excessive sweating, such as when exercising in hot or humid weather.  Not drinking enough fluid during strenuous exercise or during an illness.  Excessive urine output.  Fever.  Certain medicines. RISK FACTORS This condition is more likely to develop in:  People who are taking certain medicines that cause the body to lose excess fluid  (diuretics).   People who have a chronic illness, such as diabetes, that may increase urination.  Older adults.   People who live at high altitudes.   People who participate in endurance sports.  SYMPTOMS  Mild Dehydration  Thirst.  Dry lips.  Slightly dry mouth.  Dry, warm skin. Moderate Dehydration  Very dry mouth.   Muscle cramps.   Dark urine and decreased urine production.   Decreased tear production.   Headache.   Light-headedness, especially when you stand up from a sitting position.  Severe Dehydration  Changes in skin.   Cold and clammy skin.   Skin does not spring back quickly when lightly pinched and released.   Changes in body fluids.   Extreme thirst.   No tears.   Not able to sweat when body temperature is high, such as in hot weather.   Minimal urine production.   Changes in vital signs.   Rapid, weak pulse (more than 100 beats per minute when you are sitting still).   Rapid breathing.   Low blood pressure.   Other changes.   Sunken eyes.   Cold hands and feet.   Confusion.  Lethargy and difficulty being awakened.  Fainting (syncope).   Short-term weight loss.   Unconsciousness. DIAGNOSIS  This condition may be diagnosed based on your symptoms. You may also have tests to determine how severe your dehydration is. These tests may include:   Urine tests.   Blood tests.  TREATMENT  Treatment for this condition depends on the severity. Mild or moderate dehydration can often be treated at  home. Treatment should be started right away. Do not wait until dehydration becomes severe. Severe dehydration needs to be treated at the hospital. Treatment for Mild Dehydration  Drinking plenty of water to replace the fluid you have lost.   Replacing minerals in your blood (electrolytes) that you may have lost.  Treatment for Moderate Dehydration  Consuming oral rehydration solution (ORS). Treatment  for Severe Dehydration  Receiving fluid through an IV tube.   Receiving electrolyte solution through a feeding tube that is passed through your nose and into your stomach (nasogastric tube or NG tube).  Correcting any abnormalities in electrolytes. HOME CARE INSTRUCTIONS   Drink enough fluid to keep your urine clear or pale yellow.   Drink water or fluid slowly by taking small sips. You can also try sucking on ice cubes.  Have food or beverages that contain electrolytes. Examples include bananas and sports drinks.  Take over-the-counter and prescription medicines only as told by your health care provider.   Prepare ORS according to the manufacturer's instructions. Take sips of ORS every 5 minutes until your urine returns to normal.  If you have vomiting or diarrhea, continue to try to drink water, ORS, or both.   If you have diarrhea, avoid:   Beverages that contain caffeine.   Fruit juice.   Milk.   Carbonated soft drinks.  Do not take salt tablets. This can lead to the condition of having too much sodium in your body (hypernatremia).  SEEK MEDICAL CARE IF:  You cannot eat or drink without vomiting.  You have had moderate diarrhea during a period of more than 24 hours.  You have a fever. SEEK IMMEDIATE MEDICAL CARE IF:   You have extreme thirst.  You have severe diarrhea.  You have not urinated in 6-8 hours, or you have urinated only a small amount of very dark urine.  You have shriveled skin.  You are dizzy, confused, or both.   This information is not intended to replace advice given to you by your health care provider. Make sure you discuss any questions you have with your health care provider.   Document Released: 06/03/2005 Document Revised: 02/22/2015 Document Reviewed: 10/19/2014 Elsevier Interactive Patient Education [employer].

## 2016-03-29 ENCOUNTER — Ambulatory Visit (HOSPITAL_BASED_OUTPATIENT_CLINIC_OR_DEPARTMENT_OTHER): Payer: Medicare Other

## 2016-03-29 VITALS — BP 106/74 | HR 80 | Temp 97.2°F | Resp 20

## 2016-03-29 DIAGNOSIS — C541 Malignant neoplasm of endometrium: Secondary | ICD-10-CM | POA: Diagnosis not present

## 2016-03-29 DIAGNOSIS — D701 Agranulocytosis secondary to cancer chemotherapy: Secondary | ICD-10-CM | POA: Diagnosis present

## 2016-03-29 LAB — UPEP/UIFE/LIGHT CHAINS/TP, 24-HR UR
% BETA, URINE: 24.5 %
ALBUMIN, U: 27.4 %
ALPHA 1 URINE: 3.2 %
ALPHA-2-GLOBULIN, U: 16.8 %
FREE KAPPA LT CHAINS, UR: 128 mg/L — AB (ref 1.35–24.19)
FREE LAMBDA LT CHAINS, UR: 10.4 mg/L — AB (ref 0.24–6.66)
GAMMA GLOBULIN URINE: 28 %
Kappa/Lambda Ratio,U: 12.31 — ABNORMAL HIGH (ref 2.04–10.37)
PDF: 0
PROTEIN UR: 8.3 mg/dL
Prot,24hr calculated: 129 mg/24 hr (ref 30–150)

## 2016-03-29 MED ORDER — TBO-FILGRASTIM 300 MCG/0.5ML ~~LOC~~ SOSY
300.0000 ug | PREFILLED_SYRINGE | Freq: Once | SUBCUTANEOUS | Status: AC
  Administered 2016-03-29: 300 ug via SUBCUTANEOUS
  Filled 2016-03-29: qty 0.5

## 2016-03-29 NOTE — Patient Instructions (Signed)

## 2016-04-05 ENCOUNTER — Ambulatory Visit (HOSPITAL_BASED_OUTPATIENT_CLINIC_OR_DEPARTMENT_OTHER): Payer: Medicare Other

## 2016-04-05 VITALS — BP 107/71 | HR 85 | Temp 98.4°F | Resp 18

## 2016-04-05 DIAGNOSIS — Z23 Encounter for immunization: Secondary | ICD-10-CM

## 2016-04-05 DIAGNOSIS — C541 Malignant neoplasm of endometrium: Secondary | ICD-10-CM

## 2016-04-05 MED ORDER — INFLUENZA VAC SPLIT QUAD 0.5 ML IM SUSY
0.5000 mL | PREFILLED_SYRINGE | Freq: Once | INTRAMUSCULAR | Status: AC
  Administered 2016-04-05: 0.5 mL via INTRAMUSCULAR
  Filled 2016-04-05: qty 0.5

## 2016-04-05 NOTE — Patient Instructions (Signed)

## 2016-04-09 ENCOUNTER — Other Ambulatory Visit: Payer: Self-pay | Admitting: Oncology

## 2016-04-10 ENCOUNTER — Other Ambulatory Visit: Payer: Self-pay | Admitting: Oncology

## 2016-04-11 ENCOUNTER — Encounter: Payer: Medicare Other | Admitting: Nutrition

## 2016-04-11 ENCOUNTER — Ambulatory Visit: Payer: Medicare Other

## 2016-04-11 ENCOUNTER — Ambulatory Visit: Payer: Medicare Other | Admitting: Oncology

## 2016-04-11 ENCOUNTER — Other Ambulatory Visit (HOSPITAL_BASED_OUTPATIENT_CLINIC_OR_DEPARTMENT_OTHER): Payer: Medicare Other

## 2016-04-11 ENCOUNTER — Encounter: Payer: Self-pay | Admitting: Oncology

## 2016-04-11 VITALS — BP 114/77 | HR 76 | Temp 98.2°F | Resp 17 | Ht 68.0 in | Wt 140.0 lb

## 2016-04-11 DIAGNOSIS — C541 Malignant neoplasm of endometrium: Secondary | ICD-10-CM

## 2016-04-11 DIAGNOSIS — C7801 Secondary malignant neoplasm of right lung: Secondary | ICD-10-CM | POA: Diagnosis not present

## 2016-04-11 DIAGNOSIS — Z95828 Presence of other vascular implants and grafts: Secondary | ICD-10-CM

## 2016-04-11 DIAGNOSIS — Z5189 Encounter for other specified aftercare: Secondary | ICD-10-CM | POA: Diagnosis not present

## 2016-04-11 DIAGNOSIS — D701 Agranulocytosis secondary to cancer chemotherapy: Secondary | ICD-10-CM

## 2016-04-11 DIAGNOSIS — T451X5A Adverse effect of antineoplastic and immunosuppressive drugs, initial encounter: Secondary | ICD-10-CM

## 2016-04-11 DIAGNOSIS — C7802 Secondary malignant neoplasm of left lung: Secondary | ICD-10-CM

## 2016-04-11 DIAGNOSIS — J910 Malignant pleural effusion: Secondary | ICD-10-CM | POA: Diagnosis not present

## 2016-04-11 DIAGNOSIS — Z5111 Encounter for antineoplastic chemotherapy: Secondary | ICD-10-CM

## 2016-04-11 DIAGNOSIS — C782 Secondary malignant neoplasm of pleura: Secondary | ICD-10-CM

## 2016-04-11 LAB — CBC WITH DIFFERENTIAL/PLATELET
BASO%: 0.7 % (ref 0.0–2.0)
BASOS ABS: 0 10*3/uL (ref 0.0–0.1)
EOS ABS: 0 10*3/uL (ref 0.0–0.5)
EOS%: 0.7 % (ref 0.0–7.0)
HCT: 30.5 % — ABNORMAL LOW (ref 34.8–46.6)
HEMOGLOBIN: 9.9 g/dL — AB (ref 11.6–15.9)
LYMPH%: 46.2 % (ref 14.0–49.7)
MCH: 33.9 pg (ref 25.1–34.0)
MCHC: 32.5 g/dL (ref 31.5–36.0)
MCV: 104.5 fL — AB (ref 79.5–101.0)
MONO#: 0.4 10*3/uL (ref 0.1–0.9)
MONO%: 13.4 % (ref 0.0–14.0)
NEUT%: 39 % (ref 38.4–76.8)
NEUTROS ABS: 1.2 10*3/uL — AB (ref 1.5–6.5)
PLATELETS: 222 10*3/uL (ref 145–400)
RBC: 2.92 10*6/uL — ABNORMAL LOW (ref 3.70–5.45)
RDW: 23.6 % — AB (ref 11.2–14.5)
WBC: 3.1 10*3/uL — ABNORMAL LOW (ref 3.9–10.3)
lymph#: 1.4 10*3/uL (ref 0.9–3.3)

## 2016-04-11 LAB — COMPREHENSIVE METABOLIC PANEL
ALBUMIN: 3.1 g/dL — AB (ref 3.5–5.0)
ALK PHOS: 125 U/L (ref 40–150)
ALT: 10 U/L (ref 0–55)
ANION GAP: 7 meq/L (ref 3–11)
AST: 15 U/L (ref 5–34)
BUN: 9 mg/dL (ref 7.0–26.0)
CALCIUM: 9.3 mg/dL (ref 8.4–10.4)
CO2: 27 mEq/L (ref 22–29)
Chloride: 108 mEq/L (ref 98–109)
Creatinine: 0.7 mg/dL (ref 0.6–1.1)
GLUCOSE: 80 mg/dL (ref 70–140)
POTASSIUM: 4 meq/L (ref 3.5–5.1)
SODIUM: 142 meq/L (ref 136–145)
TOTAL PROTEIN: 8.7 g/dL — AB (ref 6.4–8.3)

## 2016-04-11 MED ORDER — ALTEPLASE 2 MG IJ SOLR
2.0000 mg | Freq: Once | INTRAMUSCULAR | Status: DC | PRN
  Filled 2016-04-11: qty 2

## 2016-04-11 MED ORDER — TBO-FILGRASTIM 300 MCG/0.5ML ~~LOC~~ SOSY
300.0000 ug | PREFILLED_SYRINGE | Freq: Once | SUBCUTANEOUS | Status: AC
  Administered 2016-04-11: 300 ug via SUBCUTANEOUS
  Filled 2016-04-11: qty 0.5

## 2016-04-11 MED ORDER — HEPARIN SOD (PORK) LOCK FLUSH 100 UNIT/ML IV SOLN
500.0000 [IU] | Freq: Once | INTRAVENOUS | Status: AC
  Administered 2016-04-11: 500 [IU] via INTRAVENOUS
  Filled 2016-04-11: qty 5

## 2016-04-11 MED ORDER — SODIUM CHLORIDE 0.9 % IJ SOLN
10.0000 mL | INTRAMUSCULAR | Status: DC | PRN
  Administered 2016-04-11: 10 mL via INTRAVENOUS
  Filled 2016-04-11: qty 10

## 2016-04-11 NOTE — Progress Notes (Signed)
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 Regimen now is dose dense carbo taxol days 1 and 8 every 21 days. Despite granix 300 mcg days 2,3,9, counts are not maintaining as well as needed. Will increase granix to 480 mcg still on days 2,3,9. Last imaging CT CAP 03-19-16 with good partial response and no new disease.   Patient saw Dr Jacquelyne Balint on 04-03-16, that note reviewed now in [place], pelvic exam with no findings of concern. Per patient, next appointment with Dr Sabra Heck is 06-2016.  Patient had cycle 4 day 1 on 03-21-16 with carbo at AUC =4 and granix 300 on days 2 and 3, then day 8 on 03-28-16 with granix 300 on day 9. She has some fatigue and taste disturbance after each treatment, but feels well on her week off of treatment, appetite very good now. She has slight clear vaginal discharge as she had a year ago, no itching, no bleeding; patient recalls that Dr Sabra Heck tried various interventions, none of which helped particularly. She had area of itching right lateral abdomen, improved. Restless legs at night stopped after she discontinued melatonin. She does not tolerate even 12.5 mg benadryl with chemo, also with restless legs, but has had no taxol reactions so will discontinue benadryl from premeds. She denies abdominal or pelvic discomfort, SOB or cough, new or different pain, swelling LE, fever or symptoms of infection, problems with PAC. No increased peripheral neuropathy. Still some soreness at times at left chest site of previous pleurex.   Remainder of 10 point Review of Systems negative.  Flu vaccine 04-05-16 PAC  Feraheme on 12-29-15, (?had IV iron also while in hospital for VATS)  ONCOLOGIC HISTORY Patient had episode of post menopausal vaginal bleeding early 2015. Later in 2015 she had persistent vaginal discharge, for which she was seen by PCP Suzann Hedgecock in ~ 02-2014, had CTs in [facility] and was referred to Dr Adella Nissen, gyn in [facility]. Endometrial biopsy had concern (that path not included in information sent for consultation) such that she was referred to Dr Jacquelyne Balint. Surgery by Dr Sabra Heck at [facility] in [place] on 04-06-14 was TAH, BSO, omentectomy, pelvic and right common iliac node evaluation; patient was hospitalized x 3 days and tells me that she recovered well from the surgery. 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 Bulk of the carcinoma was in lower uterine segment where it was within 0.1 cm of serosal surface. Cytology positive for adenocarcinoma on washings ([id number]). Surgical findings were significant for no paraaortic adenopathy apparent. Patient has received 3 cycles of adjuvant chemotherapy in [facility] by Dr Sabra Heck, on 05-05-14, 05-26-14 and 07-29-14. Per records and patient, delay of cycle 3 was related to low counts and insurance changes. She received neulasta after chemotherapy on 07-29-14. Taxol was given at 175 mg/m2 for total dose was 280 mg cycle 1  and 291 mg for cycles 2 and 3; carboplatin was AUC = 6 with total dose 610 mg cycle 1, 620 mg cycle 2 and 630 mg cycle 3.. She does not recall using oral decadron premed for taxol, with premeds listed on chemo flowsheets standard decadron 20 mg,  zofran 16 mg, benadryl 50 mg (which caused severe restless legs) and pepcid 20 mg. Outside information does not include serial blood counts. Last note from Dr Sabra Heck dated 07-29-14 describes unremarkable exam, "NED during chemotherapy and adequate PS". Patient was see by Dr Denman George 08-09-14, who recommends restaging scans after 6 cycles of chemotherapy and consideration of vaginal brachytherapy; she has not been seen by radiation oncology in [place]. CT AP done in [facility] system 08-15-14 (due to elevated LFTs and blood in urine just after transfer of care) with soft tissue fullness at superior abdominal aorta and question of retroperitoneal necrotic adenopathy; plan repeat scans after complete present chemotherapy. Cycles 4 -6 carbo taxol given 08-24-14 thru 10-21-14, with gCSF support (last cycle carboplatin only due to peripheral neuropathy in feet). CT AP 12-25-14 without adenopathy or other apparent residual or recurrent malignancy.  Radiation treatment dates: July 19, July 27, July 29, August 9, August 11 Site/dose: Proximal vagina, 30 gray in 5 fractions Patient was hospitalized at [facility] 6-15 thru 12-06-15 after presenting with progressive SOB, with finding of large left pleural effusion. CT chest 11-30-15 showed new enlarged and necrotic right hilar and mediastinal nodes, multiple right pulmonary masses, large left pleural effusion with pleural mass, tiny lucent foci too small to characterize in thoracic vertebrae. She had thoracentesis by pulmonary for 1.5 liters on 11-30-15, then left pleurex catheter placed by IR on 12-04-15 with additional 800 cc removed then. Cytology [id number] adenocarcinoma (appeared serous per pathologist verbal report). CT AP 12-01-15 showed no new or suspicious liver findings, 1.2 cm left periaortic node, large nodal mass left pelvic sidewall 6.2 x 2.9 cm, no ascites or peritoneal changes, no apparent bony findings. She had day 1 cycle 1 dose dense carbo taxol on 01-04-16, with delay  for counts and symptoms prior to changing regimen to day 1 day 8 every 21 days. CT CAP 03-19-16 showed improvement after 3 cycles.    Anemia: hemoglobin ranged from 8.1 - 12.6 thru chemo, with MCV 103 when I met her after first chemo and up to 109 during treatment. Iron studies 08-2014 had serum iron 43, %sat 22, ferritin 429, B12 >2000 SIEP 10-2014 had no monoclonal protein (elevated IgG and IgA polyclonal). Urinalyses had microscopic hematuria, with cystoscopy by Dr Burman Nieves at [facility] 10-20-2014 unremarkable. Haptoglobin 08-2014 not low (449), LDH normal, total bili occasionally elevated (2-29-26) but often normal. She was transfused 2 units PRBCs 09-2014 for hemoglobin 8.1.  Colonoscopy by Dr Oretha Caprice [facility] 05-23-15 unable to advance pediatric scope beyond sigmoid colon, which seemed to be fixed   Objective:  Vital signs in last 24 hours:  BP 114/77 (BP Location: Left Arm, Patient Position: Sitting)   Pulse 76   Temp 98.2 F (36.8 C) (Oral)   Resp 17   Ht _0  (1.727 m)   Wt 140 lb (63.5 kg)   SpO2 98%   BMI 21.29 kg/m  Weight up 2 lbs. Looks comfortable, respirations not labored, pleasant and relaxed. Alert, oriented and appropriate. Ambulatory without difficulty.   HEENT:PERRL, sclerae not icteric. Oral mucosa moist without lesions, posterior pharynx clear.  Neck supple. No JVD.  Lymphatics:no cervical,supraclavicular or inguinal adenopathy Resp: clear to  auscultation bilaterally and normal percussion bilaterally Cardio: regular rate and rhythm. No gallop. GI: soft, nontender, not distended, no mass or organomegaly. Normally active bowel sounds. Surgical incision not remarkable. Musculoskeletal/ Extremities: without pitting edema, cords, tenderness Neuro: no increased peripheral neuropathy. Otherwise nonfocal. PSYCH appropriate mood and affect Skin dry area right lateral abdomen, no rash, and rest of skin also rather dry without rash, ecchymosis,  petechiae Portacath-without erythema or tenderness  Lab Results:  Results for orders placed or performed in visit on 04/11/16  CBC with Differential  Result Value Ref Range   WBC 3.1 (L) 3.9 - 10.3 10e3/uL   NEUT# 1.2 (L) 1.5 - 6.5 10e3/uL   HGB 9.9 (L) 11.6 - 15.9 g/dL   HCT 30.5 (L) 34.8 - 46.6 %   Platelets 222 145 - 400 10e3/uL   MCV 104.5 (H) 79.5 - 101.0 fL   MCH 33.9 25.1 - 34.0 pg   MCHC 32.5 31.5 - 36.0 g/dL   RBC 2.92 (L) 3.70 - 5.45 10e6/uL   RDW 23.6 (H) 11.2 - 14.5 %   lymph# 1.4 0.9 - 3.3 10e3/uL   MONO# 0.4 0.1 - 0.9 10e3/uL   Eosinophils Absolute 0.0 0.0 - 0.5 10e3/uL   Basophils Absolute 0.0 0.0 - 0.1 10e3/uL   NEUT% 39.0 38.4 - 76.8 %   LYMPH% 46.2 14.0 - 49.7 %   MONO% 13.4 0.0 - 14.0 %   EOS% 0.7 0.0 - 7.0 %   BASO% 0.7 0.0 - 2.0 %  Comprehensive metabolic panel  Result Value Ref Range   Sodium 142 136 - 145 mEq/L   Potassium 4.0 3.5 - 5.1 mEq/L   Chloride 108 98 - 109 mEq/L   CO2 27 22 - 29 mEq/L   Glucose 80 70 - 140 mg/dl   BUN 9.0 7.0 - 26.0 mg/dL   Creatinine 0.7 0.6 - 1.1 mg/dL   Total Bilirubin <0.22 0.20 - 1.20 mg/dL   Alkaline Phosphatase 125 40 - 150 U/L   AST 15 5 - 34 U/L   ALT 10 0 - 55 U/L   Total Protein 8.7 (H) 6.4 - 8.3 g/dL   Albumin 3.1 (L) 3.5 - 5.0 g/dL   Calcium 9.3 8.4 - 10.4 mg/dL   Anion Gap 7 3 - 11 mEq/L   EGFR >90 >90 ml/min/1.73 m2    Will repeat iron studies with next labs 04-18-16  Studies/Results:  No results found.   CT 03-19-16 as noted previously  Medications: I have reviewed the patient's current medications. Increase granix to 480 mcg.  DISCUSSION Clinically doing well on present regimen, will increase granix dose to 480 mcg still days 2,3,9 . Will give cycle 5 on 11-2 and 11-9 , then cycle 6 after Thanksgiving on 11-30 and 05-23-16.  Appreciate Dr Ammie Ferrier review.   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 Treatment in attempt to palliate and control disease, does dense carbo taxolday 1 day 8 every 21 days, Granix doses increased. Tolerating well and responding by CT. Continue. Next visit Dr Sabra Heck Jan 2018  2.malignant left pleural effusion as well as involvement left pleura and lungs: post left VATS with sclerosis by Dr Prescott Gum 12-22-15. Still doing well 3.DNR patient's request. Advance Directives completed.  4.iron deficiency anemia: she had feraheme on 7-14 and possibly another dose in hospital early July. Transfused 2 units PRBCs early Aug.  Will recheck iron studies 04-18-16 5.elevated total protein and low albumin: SPEP previously no M spike. 24 hr urine 09-12-15 no M spike. 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 Continue to push fluids 8.PAC in 9. Long past tobacco: DCd 09-2015. Emphysematous changes on CT. Known to [name]  10. Microscopic hematuria: known to Dr Louis Meckel. Urine cytology negative  11.unable to visualize beyond sigmoid on colonoscopy 12-16 due to stricture  12.history substance abuse including ETOH and marijuana 13. long history anxiety and depression. [place] chaplain and counseling intern involved - thank you 14.chemo cytopenias: carbo dose adjusted, using granix 480 days 2,3, 9 15. Restless legs with even 12.5 mg benadryl: DC benadryl from taxol premeds. Restless legs also with melatonin, DCd.  16.flu vaccine 04-05-16  All questions answered and patient knows to call if needed prior to next scheduled visit.  Chemo orders confirmed, premeds and granix changed.  Time spent 25 min including >50% counseling and coordination of care.   Evlyn Clines, MD   04/11/2016, 5:20 PM

## 2016-04-11 NOTE — Patient Instructions (Signed)

## 2016-04-13 ENCOUNTER — Other Ambulatory Visit: Payer: Self-pay | Admitting: Oncology

## 2016-04-13 ENCOUNTER — Encounter: Payer: Self-pay | Admitting: Oncology

## 2016-04-13 NOTE — Progress Notes (Signed)
Medical Oncology  Message sent to scheduling to correct chemo dates as follows:    Cancel 11-9 treatment lab flush cancel injection 11-10 Add 11-16 lab from [place] skin test [place] short taxol (day 1 cycle 6) Granix 11-17 and 11-18 Will not give chemo week of Thanksgiving. (skip day 8 cycle 6)  L.Marko Plume, MD

## 2016-04-18 ENCOUNTER — Other Ambulatory Visit (HOSPITAL_BASED_OUTPATIENT_CLINIC_OR_DEPARTMENT_OTHER): Payer: Medicare Other

## 2016-04-18 ENCOUNTER — Telehealth: Payer: Self-pay

## 2016-04-18 ENCOUNTER — Ambulatory Visit (HOSPITAL_BASED_OUTPATIENT_CLINIC_OR_DEPARTMENT_OTHER): Payer: Medicare Other

## 2016-04-18 VITALS — BP 96/73 | HR 78 | Temp 98.0°F | Resp 16

## 2016-04-18 DIAGNOSIS — C541 Malignant neoplasm of endometrium: Secondary | ICD-10-CM

## 2016-04-18 DIAGNOSIS — Z5111 Encounter for antineoplastic chemotherapy: Secondary | ICD-10-CM

## 2016-04-18 LAB — COMPREHENSIVE METABOLIC PANEL
ALBUMIN: 3.1 g/dL — AB (ref 3.5–5.0)
ALK PHOS: 122 U/L (ref 40–150)
ALT: 12 U/L (ref 0–55)
AST: 16 U/L (ref 5–34)
Anion Gap: 7 mEq/L (ref 3–11)
BUN: 12.4 mg/dL (ref 7.0–26.0)
CALCIUM: 9.6 mg/dL (ref 8.4–10.4)
CO2: 25 mEq/L (ref 22–29)
CREATININE: 0.7 mg/dL (ref 0.6–1.1)
Chloride: 108 mEq/L (ref 98–109)
EGFR: >90 mL/min/{1.73_m2} (ref >90)
GLUCOSE: 81 mg/dL (ref 70–140)
Potassium: 3.9 mEq/L (ref 3.5–5.1)
SODIUM: 140 meq/L (ref 136–145)
TOTAL PROTEIN: 8.7 g/dL — AB (ref 6.4–8.3)

## 2016-04-18 LAB — CBC WITH DIFFERENTIAL/PLATELET
BASO%: 0.4 % (ref 0.0–2.0)
BASOS ABS: 0 10*3/uL (ref 0.0–0.1)
EOS ABS: 0.1 10*3/uL (ref 0.0–0.5)
EOS%: 1.3 % (ref 0.0–7.0)
HEMATOCRIT: 32 % — AB (ref 34.8–46.6)
HEMOGLOBIN: 10.5 g/dL — AB (ref 11.6–15.9)
LYMPH#: 1.5 10*3/uL (ref 0.9–3.3)
LYMPH%: 32.3 % (ref 14.0–49.7)
MCH: 35 pg — AB (ref 25.1–34.0)
MCHC: 32.8 g/dL (ref 31.5–36.0)
MCV: 106.7 fL — AB (ref 79.5–101.0)
MONO#: 0.6 10*3/uL (ref 0.1–0.9)
MONO%: 13 % (ref 0.0–14.0)
NEUT%: 53 % (ref 38.4–76.8)
NEUTROS ABS: 2.5 10*3/uL (ref 1.5–6.5)
Platelets: 270 10*3/uL (ref 145–400)
RBC: 3 10*6/uL — ABNORMAL LOW (ref 3.70–5.45)
RDW: 22.7 % — AB (ref 11.2–14.5)
WBC: 4.7 10*3/uL (ref 3.9–10.3)

## 2016-04-18 LAB — IRON AND TIBC
%SAT: 19 % — ABNORMAL LOW (ref 21–57)
IRON: 41 ug/dL (ref 41–142)
TIBC: 218 ug/dL — ABNORMAL LOW (ref 236–444)
UIBC: 177 ug/dL (ref 120–384)

## 2016-04-18 MED ORDER — SODIUM CHLORIDE 0.9 % IV SOLN
Freq: Once | INTRAVENOUS | Status: AC
  Administered 2016-04-18: 10:00:00 via INTRAVENOUS

## 2016-04-18 MED ORDER — SODIUM CHLORIDE 0.9 % IV SOLN
20.0000 mg | Freq: Once | INTRAVENOUS | Status: AC
  Administered 2016-04-18: 20 mg via INTRAVENOUS
  Filled 2016-04-18: qty 2

## 2016-04-18 MED ORDER — CARBOPLATIN CHEMO INTRADERMAL TEST DOSE 100MCG/0.02ML
100.0000 ug | Freq: Once | INTRADERMAL | Status: AC
  Administered 2016-04-18: 100 ug via INTRADERMAL
  Filled 2016-04-18: qty 0.02

## 2016-04-18 MED ORDER — FAMOTIDINE IN NACL 20-0.9 MG/50ML-% IV SOLN
20.0000 mg | Freq: Once | INTRAVENOUS | Status: AC
  Administered 2016-04-18: 20 mg via INTRAVENOUS

## 2016-04-18 MED ORDER — SODIUM CHLORIDE 0.9% FLUSH
10.0000 mL | INTRAVENOUS | Status: DC | PRN
  Administered 2016-04-18: 10 mL
  Filled 2016-04-18: qty 10

## 2016-04-18 MED ORDER — FAMOTIDINE IN NACL 20-0.9 MG/50ML-% IV SOLN
INTRAVENOUS | Status: AC
  Filled 2016-04-18: qty 50

## 2016-04-18 MED ORDER — DEXTROSE 5 % IV SOLN
80.0000 mg/m2 | Freq: Once | INTRAVENOUS | Status: AC
  Administered 2016-04-18: 138 mg via INTRAVENOUS
  Filled 2016-04-18: qty 23

## 2016-04-18 MED ORDER — DEXAMETHASONE SODIUM PHOSPHATE 10 MG/ML IJ SOLN
INTRAMUSCULAR | Status: AC
  Filled 2016-04-18: qty 1

## 2016-04-18 MED ORDER — PALONOSETRON HCL INJECTION 0.25 MG/5ML
0.2500 mg | Freq: Once | INTRAVENOUS | Status: AC
  Administered 2016-04-18: 0.25 mg via INTRAVENOUS

## 2016-04-18 MED ORDER — PALONOSETRON HCL INJECTION 0.25 MG/5ML
INTRAVENOUS | Status: AC
  Filled 2016-04-18: qty 5

## 2016-04-18 MED ORDER — HEPARIN SOD (PORK) LOCK FLUSH 100 UNIT/ML IV SOLN
500.0000 [IU] | Freq: Once | INTRAVENOUS | Status: AC | PRN
  Administered 2016-04-18: 500 [IU]
  Filled 2016-04-18: qty 5

## 2016-04-18 MED ORDER — SODIUM CHLORIDE 0.9 % IV SOLN
392.4000 mg | Freq: Once | INTRAVENOUS | Status: AC
  Administered 2016-04-18: 390 mg via INTRAVENOUS
  Filled 2016-04-18: qty 39

## 2016-04-18 NOTE — Telephone Encounter (Signed)
-----   Message from Gordy Levan, MD sent at 04/18/2016  4:22 PM EDT ----- Labs seen and need follow up: please let her know iron is better, does not need more IV iron now    thanks

## 2016-04-18 NOTE — Patient Instructions (Signed)
[facility] Discharge Instructions for Patients Receiving Chemotherapy  Today you received the following chemotherapy agents: Carbo and Taxol  To help prevent nausea and vomiting after your treatment, we encourage you to take your nausea medication If you develop nausea and vomiting that is not controlled by your nausea medication, call the clinic.   BELOW ARE SYMPTOMS THAT SHOULD BE REPORTED IMMEDIATELY:  *FEVER GREATER THAN 100.5 F  *CHILLS WITH OR WITHOUT FEVER  NAUSEA AND VOMITING THAT IS NOT CONTROLLED WITH YOUR NAUSEA MEDICATION  *UNUSUAL SHORTNESS OF BREATH  *UNUSUAL BRUISING OR BLEEDING  TENDERNESS IN MOUTH AND THROAT WITH OR WITHOUT PRESENCE OF ULCERS  *URINARY PROBLEMS  *BOWEL PROBLEMS  UNUSUAL RASH Items with * indicate a potential emergency and should be followed up as soon as possible.  Feel free to call the clinic you have any questions or concerns. The clinic phone number is [phone].  Please show the [place] at check-in to the Emergency Department and triage nurse.

## 2016-04-18 NOTE — Telephone Encounter (Signed)
Spoke with Sheena Caldwell and told her the results of the iron studies as noted below by Dr. Marko Plume.

## 2016-04-19 ENCOUNTER — Ambulatory Visit (HOSPITAL_BASED_OUTPATIENT_CLINIC_OR_DEPARTMENT_OTHER): Payer: Medicare Other

## 2016-04-19 ENCOUNTER — Other Ambulatory Visit: Payer: Self-pay | Admitting: Physician Assistant

## 2016-04-19 VITALS — BP 123/73 | HR 84 | Temp 98.1°F | Resp 18

## 2016-04-19 DIAGNOSIS — Z1231 Encounter for screening mammogram for malignant neoplasm of breast: Secondary | ICD-10-CM

## 2016-04-19 DIAGNOSIS — C541 Malignant neoplasm of endometrium: Secondary | ICD-10-CM

## 2016-04-19 DIAGNOSIS — Z5189 Encounter for other specified aftercare: Secondary | ICD-10-CM | POA: Diagnosis not present

## 2016-04-19 MED ORDER — TBO-FILGRASTIM 480 MCG/0.8ML ~~LOC~~ SOSY
480.0000 ug | PREFILLED_SYRINGE | Freq: Once | SUBCUTANEOUS | Status: AC
  Administered 2016-04-19: 480 ug via SUBCUTANEOUS
  Filled 2016-04-19: qty 0.8

## 2016-04-19 NOTE — Patient Instructions (Signed)

## 2016-04-20 ENCOUNTER — Ambulatory Visit (HOSPITAL_BASED_OUTPATIENT_CLINIC_OR_DEPARTMENT_OTHER): Payer: Medicare Other

## 2016-04-20 VITALS — BP 105/73 | HR 102 | Temp 97.8°F | Resp 18

## 2016-04-20 DIAGNOSIS — C541 Malignant neoplasm of endometrium: Secondary | ICD-10-CM | POA: Diagnosis present

## 2016-04-20 MED ORDER — TBO-FILGRASTIM 480 MCG/0.8ML ~~LOC~~ SOSY
480.0000 ug | PREFILLED_SYRINGE | Freq: Once | SUBCUTANEOUS | Status: AC
  Administered 2016-04-20: 480 ug via SUBCUTANEOUS

## 2016-04-23 ENCOUNTER — Ambulatory Visit
Admission: RE | Admit: 2016-04-23 | Discharge: 2016-04-23 | Disposition: A | Discharge status: Home / Self Care | Payer: Medicare Other | Source: Ambulatory Visit | Attending: Physician Assistant | Admitting: Physician Assistant

## 2016-04-23 ENCOUNTER — Encounter: Payer: Self-pay | Admitting: *Deleted

## 2016-04-23 DIAGNOSIS — Z1231 Encounter for screening mammogram for malignant neoplasm of breast: Secondary | ICD-10-CM

## 2016-04-23 NOTE — Progress Notes (Signed)
[place] Work  Clinical Social Work received phone call from patient requesting guidance on supplement coverage.  Patient reported she lost her Medicaid coverage.  CSW recommended patient call [place] [employer] ([place] can advise on supplemental plans and Medicare Part D plans.  Patient expressed understanding and plans to call CSW if questions arise.   Polo Riley, MSW, LCSW, OSW-C Clinical Social Worker [facility] [phone]

## 2016-04-25 ENCOUNTER — Ambulatory Visit: Payer: Medicare Other

## 2016-04-25 ENCOUNTER — Other Ambulatory Visit: Payer: Medicare Other

## 2016-04-26 ENCOUNTER — Ambulatory Visit: Payer: Medicare Other

## 2016-05-02 ENCOUNTER — Other Ambulatory Visit: Payer: Self-pay | Admitting: Oncology

## 2016-05-02 ENCOUNTER — Ambulatory Visit (HOSPITAL_BASED_OUTPATIENT_CLINIC_OR_DEPARTMENT_OTHER): Payer: Medicare Other

## 2016-05-02 VITALS — BP 109/75 | HR 71 | Temp 98.4°F | Resp 16

## 2016-05-02 DIAGNOSIS — Z5189 Encounter for other specified aftercare: Secondary | ICD-10-CM | POA: Diagnosis not present

## 2016-05-02 DIAGNOSIS — Z95828 Presence of other vascular implants and grafts: Secondary | ICD-10-CM

## 2016-05-02 DIAGNOSIS — C541 Malignant neoplasm of endometrium: Secondary | ICD-10-CM

## 2016-05-02 LAB — COMPREHENSIVE METABOLIC PANEL
ALBUMIN: 3.1 g/dL — AB (ref 3.5–5.0)
ALT: 10 U/L (ref 0–55)
ANION GAP: 9 meq/L (ref 3–11)
AST: 16 U/L (ref 5–34)
Alkaline Phosphatase: 114 U/L (ref 40–150)
BILIRUBIN TOTAL: 0.22 mg/dL (ref 0.20–1.20)
BUN: 11.5 mg/dL (ref 7.0–26.0)
CALCIUM: 9.8 mg/dL (ref 8.4–10.4)
CO2: 24 meq/L (ref 22–29)
CREATININE: 0.7 mg/dL (ref 0.6–1.1)
Chloride: 108 mEq/L (ref 98–109)
EGFR: >90 mL/min/{1.73_m2} (ref >90)
Glucose: 101 mg/dl (ref 70–140)
Potassium: 3.8 mEq/L (ref 3.5–5.1)
Sodium: 141 mEq/L (ref 136–145)
TOTAL PROTEIN: 8.7 g/dL — AB (ref 6.4–8.3)

## 2016-05-02 LAB — CBC WITH DIFFERENTIAL/PLATELET
BASO%: 1.3 % (ref 0.0–2.0)
Basophils Absolute: 0 10*3/uL (ref 0.0–0.1)
EOS%: 3 % (ref 0.0–7.0)
Eosinophils Absolute: 0.1 10*3/uL (ref 0.0–0.5)
HEMATOCRIT: 31.1 % — AB (ref 34.8–46.6)
HEMOGLOBIN: 10.2 g/dL — AB (ref 11.6–15.9)
LYMPH#: 1.4 10*3/uL (ref 0.9–3.3)
LYMPH%: 53.5 % — ABNORMAL HIGH (ref 14.0–49.7)
MCH: 36.2 pg — ABNORMAL HIGH (ref 25.1–34.0)
MCHC: 32.7 g/dL (ref 31.5–36.0)
MCV: 110.6 fL — ABNORMAL HIGH (ref 79.5–101.0)
MONO#: 0.2 10*3/uL (ref 0.1–0.9)
MONO%: 8.4 % (ref 0.0–14.0)
NEUT%: 33.8 % — ABNORMAL LOW (ref 38.4–76.8)
NEUTROS ABS: 0.9 10*3/uL — AB (ref 1.5–6.5)
PLATELETS: 251 10*3/uL (ref 145–400)
RBC: 2.82 10*6/uL — ABNORMAL LOW (ref 3.70–5.45)
RDW: 20.2 % — ABNORMAL HIGH (ref 11.2–14.5)
WBC: 2.6 10*3/uL — AB (ref 3.9–10.3)

## 2016-05-02 MED ORDER — FAMOTIDINE IN NACL 20-0.9 MG/50ML-% IV SOLN: 20.0000 mg | Freq: Once | INTRAVENOUS | Status: DC

## 2016-05-02 MED ORDER — SODIUM CHLORIDE 0.9 % IV SOLN: Freq: Once | INTRAVENOUS | Status: DC

## 2016-05-02 MED ORDER — PACLITAXEL CHEMO INJECTION 300 MG/50ML: 80.0000 mg/m2 | Freq: Once | INTRAVENOUS | Status: DC

## 2016-05-02 MED ORDER — TBO-FILGRASTIM 480 MCG/0.8ML ~~LOC~~ SOSY
480.0000 ug | PREFILLED_SYRINGE | Freq: Once | SUBCUTANEOUS | Status: AC
  Administered 2016-05-02: 480 ug via SUBCUTANEOUS
  Filled 2016-05-02: qty 0.8

## 2016-05-02 MED ORDER — SODIUM CHLORIDE 0.9 % IJ SOLN
10.0000 mL | INTRAMUSCULAR | Status: DC | PRN
  Administered 2016-05-02: 10 mL via INTRAVENOUS
  Filled 2016-05-02: qty 10

## 2016-05-02 MED ORDER — DIPHENHYDRAMINE HCL 50 MG/ML IJ SOLN: 12.5000 mg | Freq: Once | INTRAMUSCULAR | Status: DC

## 2016-05-02 MED ORDER — HEPARIN SOD (PORK) LOCK FLUSH 100 UNIT/ML IV SOLN
500.0000 [IU] | Freq: Once | INTRAVENOUS | Status: AC | PRN
  Administered 2016-05-02: 500 [IU] via INTRAVENOUS
  Filled 2016-05-02: qty 5

## 2016-05-02 MED ORDER — SODIUM CHLORIDE 0.9 % IV SOLN: 20.0000 mg | Freq: Once | INTRAVENOUS | Status: DC

## 2016-05-02 NOTE — Progress Notes (Signed)
Per Dr Marko Plume, for [place] 0.9, no taxol today, pt to get granix today and tomorrow, none on Saturday. Pt to keep schedule on 11/27.

## 2016-05-02 NOTE — Patient Instructions (Addendum)
Neutropenia Introduction Neutropenia is a condition that occurs when you have a lower-than-normal level of a type of white blood cell (neutrophil) in your body. Neutrophils are made in the spongy center of large bones (bone marrow) and they fight infections. Neutrophils are your body's main defense against bacterial and fungal infections. The fewer neutrophils you have and the longer your body remains without them, the greater your risk of getting a severe infection. What are the causes? This condition can occur if your body uses up or destroys neutrophils faster than your bone marrow can make them. This problem may happen because of:  Bacterial or fungal infection.  Allergic disorders.  Reactions to some medicines.  Autoimmune disease.  An enlarged spleen. This condition can also occur if your bone marrow does not produce enough neutrophils. This problem may be caused by:  Cancer.  Cancer treatments, such as radiation or chemotherapy.  Viral infections.  Medicines, such as phenytoin.  Vitamin B12 deficiency.  Diseases of the bone marrow.  Environmental toxins, such as insecticides. What are the signs or symptoms? This condition does not usually cause symptoms. If symptoms are present, they are usually caused by an underlying infection. Symptoms of an infection may include:  Fever.  Chills.  Swollen glands.  Oral or anal ulcers.  Cough and shortness of breath.  Rash.  Skin infection.  Fatigue. How is this diagnosed? Your health care provider may suspect neutropenia if you have:  A condition that may cause neutropenia.  Symptoms of infection, especially fever.  Frequent and unusual infections. You will have a medical history and physical exam. Tests will also be done, such as:  A complete blood count (CBC).  A procedure to collect a sample of bone marrow for examination (bone marrow biopsy).  A chest X-ray.  A urine culture.  A blood culture. How is  this treated? Treatment depends on the underlying cause and severity of your condition. Mild neutropenia may not require treatment. Treatment may include medicines, such as:  Antibiotic medicine given through an IV tube.  Antiviral medicines.  Antifungal medicines.  A medicine to increase neutrophil production (colony-stimulating factor). You may get this drug through an IV tube or by injection.  Steroids given through an IV tube. If an underlying condition is causing neutropenia, you may need treatment for that condition. If medicines you are taking are causing neutropenia, your health care provider may have you stop taking those medicines. Follow these instructions at home: Medicines  Take over-the-counter and prescription medicines only as told by your health care provider.  Get a seasonal flu shot (influenza vaccine). Lifestyle  Do not eat unpasteurized foods.Do not eat unwashed raw fruits or vegetables.  Avoid exposure to groups of people or children.  Avoid being around people who are sick.  Avoid being around dirt or dust, such as in construction areas or gardens.  Do not provide direct care for pets. Avoid animal droppings. Do not clean litter boxes and bird cages. Hygiene   Bathe daily.  Clean the area between the genitals and the anus (perineal area) after you urinate or have a bowel movement. If you are female, wipe from front to back.  Brush your teeth with a soft toothbrush before and after meals.  Do not use a razor that has a blade. Use an electric razor to remove hair.  Wash your hands often. Make sure others who come in contact with you also wash their hands. If soap and water are not available, use hand   sanitizer. General instructions  Do not have sex unless your health care provider has approved.  Take actions to avoid cuts and burns. For example:  Be cautious when you use knives. Always cut away from yourself.  Keep knives in protective sheaths or  guards when not in use.  Use oven mitts when you cook with a hot stove, oven, or grill.  Stand a safe distance away from open fires.  Avoid people who received a vaccine in the past 30 days if that vaccine contained a live version of the germ (live vaccine). You should not get a live vaccine. Common live vaccines are varicella, measles, mumps, and rubella.  Do not share food utensils.  Do not use tampons, enemas, or rectal suppositories unless your health care provider has approved.  Keep all appointments as told by your health care provider. This is important. Contact a health care provider if:  You have a fever.  You have chills or you start to shake.  You have:  A sore throat.  A warm, red, or tender area on your skin.  A cough.  Frequent or painful urination.  Vaginal discharge or itching.  You develop:  Sores in your mouth or anus.  Swollen lymph nodes.  Red streaks on the skin.  A rash.  You feel:  Nauseous or you vomit.  Very fatigued.  Short of breath. This information is not intended to replace advice given to you by your health care provider. Make sure you discuss any questions you have with your health care provider. Document Released: 11/23/2001 Document Revised: 11/09/2015 Document Reviewed: 12/14/2014  2017 Elsevier

## 2016-05-03 ENCOUNTER — Ambulatory Visit (HOSPITAL_BASED_OUTPATIENT_CLINIC_OR_DEPARTMENT_OTHER): Payer: Medicare Other

## 2016-05-03 VITALS — BP 122/81 | HR 80 | Temp 98.1°F | Resp 18

## 2016-05-03 DIAGNOSIS — C541 Malignant neoplasm of endometrium: Secondary | ICD-10-CM | POA: Diagnosis present

## 2016-05-03 DIAGNOSIS — Z5189 Encounter for other specified aftercare: Secondary | ICD-10-CM

## 2016-05-03 MED ORDER — TBO-FILGRASTIM 480 MCG/0.8ML ~~LOC~~ SOSY
480.0000 ug | PREFILLED_SYRINGE | Freq: Once | SUBCUTANEOUS | Status: AC
  Administered 2016-05-03: 480 ug via SUBCUTANEOUS
  Filled 2016-05-03: qty 0.8

## 2016-05-03 NOTE — Patient Instructions (Signed)

## 2016-05-04 ENCOUNTER — Ambulatory Visit: Payer: Medicare Other

## 2016-05-10 ENCOUNTER — Other Ambulatory Visit: Payer: Self-pay | Admitting: Oncology

## 2016-05-11 ENCOUNTER — Other Ambulatory Visit: Payer: Self-pay | Admitting: Oncology

## 2016-05-11 DIAGNOSIS — C541 Malignant neoplasm of endometrium: Secondary | ICD-10-CM

## 2016-05-13 ENCOUNTER — Encounter: Payer: Self-pay | Admitting: Oncology

## 2016-05-13 ENCOUNTER — Telehealth: Payer: Self-pay

## 2016-05-13 ENCOUNTER — Ambulatory Visit (HOSPITAL_BASED_OUTPATIENT_CLINIC_OR_DEPARTMENT_OTHER): Payer: Medicare Other | Admitting: Oncology

## 2016-05-13 ENCOUNTER — Ambulatory Visit (HOSPITAL_BASED_OUTPATIENT_CLINIC_OR_DEPARTMENT_OTHER): Payer: Medicare Other

## 2016-05-13 VITALS — BP 123/79 | HR 80 | Temp 97.5°F | Resp 18 | Ht 68.0 in | Wt 143.2 lb

## 2016-05-13 DIAGNOSIS — G620 Drug-induced polyneuropathy: Secondary | ICD-10-CM

## 2016-05-13 DIAGNOSIS — C7802 Secondary malignant neoplasm of left lung: Secondary | ICD-10-CM

## 2016-05-13 DIAGNOSIS — C541 Malignant neoplasm of endometrium: Secondary | ICD-10-CM

## 2016-05-13 DIAGNOSIS — D509 Iron deficiency anemia, unspecified: Secondary | ICD-10-CM | POA: Diagnosis not present

## 2016-05-13 DIAGNOSIS — J910 Malignant pleural effusion: Secondary | ICD-10-CM

## 2016-05-13 DIAGNOSIS — C782 Secondary malignant neoplasm of pleura: Secondary | ICD-10-CM

## 2016-05-13 DIAGNOSIS — Z95828 Presence of other vascular implants and grafts: Secondary | ICD-10-CM

## 2016-05-13 DIAGNOSIS — D701 Agranulocytosis secondary to cancer chemotherapy: Secondary | ICD-10-CM

## 2016-05-13 DIAGNOSIS — I951 Orthostatic hypotension: Secondary | ICD-10-CM

## 2016-05-13 DIAGNOSIS — C7801 Secondary malignant neoplasm of right lung: Secondary | ICD-10-CM

## 2016-05-13 DIAGNOSIS — T451X5A Adverse effect of antineoplastic and immunosuppressive drugs, initial encounter: Secondary | ICD-10-CM

## 2016-05-13 LAB — COMPREHENSIVE METABOLIC PANEL
ALBUMIN: 2.8 g/dL — AB (ref 3.5–5.0)
ALK PHOS: 129 U/L (ref 40–150)
ALT: 19 U/L (ref 0–55)
ANION GAP: 9 meq/L (ref 3–11)
AST: 14 U/L (ref 5–34)
BUN: 10 mg/dL (ref 7.0–26.0)
CALCIUM: 9.9 mg/dL (ref 8.4–10.4)
CHLORIDE: 107 meq/L (ref 98–109)
CO2: 27 mEq/L (ref 22–29)
Creatinine: 0.7 mg/dL (ref 0.6–1.1)
Glucose: 73 mg/dl (ref 70–140)
POTASSIUM: 3.9 meq/L (ref 3.5–5.1)
Sodium: 143 mEq/L (ref 136–145)
Total Bilirubin: <0.22 mg/dL (ref 0.20–1.20)
Total Protein: 8.5 g/dL — ABNORMAL HIGH (ref 6.4–8.3)

## 2016-05-13 LAB — CBC WITH DIFFERENTIAL/PLATELET
BASO%: 1.2 % (ref 0.0–2.0)
Basophils Absolute: 0 10*3/uL (ref 0.0–0.1)
EOS%: 2.9 % (ref 0.0–7.0)
Eosinophils Absolute: 0.1 10*3/uL (ref 0.0–0.5)
HCT: 30.3 % — ABNORMAL LOW (ref 34.8–46.6)
HEMOGLOBIN: 9.9 g/dL — AB (ref 11.6–15.9)
LYMPH%: 38.3 % (ref 14.0–49.7)
MCH: 36.6 pg — ABNORMAL HIGH (ref 25.1–34.0)
MCHC: 32.7 g/dL (ref 31.5–36.0)
MCV: 112 fL — ABNORMAL HIGH (ref 79.5–101.0)
MONO#: 0.2 10*3/uL (ref 0.1–0.9)
MONO%: 5.7 % (ref 0.0–14.0)
NEUT%: 51.9 % (ref 38.4–76.8)
NEUTROS ABS: 2 10*3/uL (ref 1.5–6.5)
Platelets: 207 10*3/uL (ref 145–400)
RBC: 2.71 10*6/uL — AB (ref 3.70–5.45)
RDW: 16.4 % — AB (ref 11.2–14.5)
WBC: 3.8 10*3/uL — AB (ref 3.9–10.3)
lymph#: 1.4 10*3/uL (ref 0.9–3.3)

## 2016-05-13 MED ORDER — FLUDROCORTISONE ACETATE 0.1 MG PO TABS: 0.1000 mg | ORAL_TABLET | Freq: Every day | ORAL | 2 refills | Status: DC

## 2016-05-13 MED ORDER — SODIUM CHLORIDE 0.9 % IJ SOLN
10.0000 mL | INTRAMUSCULAR | Status: DC | PRN
  Administered 2016-05-13: 10 mL via INTRAVENOUS
  Filled 2016-05-13: qty 10

## 2016-05-13 MED ORDER — HEPARIN SOD (PORK) LOCK FLUSH 100 UNIT/ML IV SOLN
500.0000 [IU] | Freq: Once | INTRAVENOUS | Status: AC | PRN
  Administered 2016-05-13: 500 [IU] via INTRAVENOUS
  Filled 2016-05-13: qty 5

## 2016-05-13 NOTE — Progress Notes (Signed)
Met with patient whom had financial concerns regarding treatment and Medicaid ending. Asked patient if she has spoken with her caseworker regarding Medicaid. Patient states that she was told beginning 05/17/16 Medicaid will only cover her premium for Medicare. Asked patient if her caseworker advise her if she would qualify for Medicaid with a deductible. She said no. Patient called caseworker while in my office and left a voicemail and explained to her how that will work if approved. Patient verbalized understanding.  Applied for copay assistance through [employer] for [employer]. Patient approved 04/13/16-04/12/17.       Fatima Sanger has been created successfully with following details:  HealthWell ID  [id number]  Reference Number [id number]  Fund Chemotherapy Induced Neutropenia - Medicare Access  Assistance Type Co-pay  Start Date  04-13-2016   End Date  04-12-2017   Please email [place] at grants@healthwellfoundation .org or call us at [phone] for further assistance   Grant amount [id number]. Patient given a copy of approval letter.

## 2016-05-13 NOTE — Telephone Encounter (Signed)
Told Ms Gogan that Dr. Marko Plume said that she did not need to repeat labs on Thursday 05-16-16 prior to treatment at 0930.  Told her to check in ~0910. Told Ms Sabra Heck that Dr. Marko Plume was aware of her follow up  appointment with Dr. Sabra Heck on 07-04-16.

## 2016-05-13 NOTE — Progress Notes (Signed)
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 Chemo is dose dense carbo taxol days 1 and 8 every 21 days, with granix on days 2, 3, 9. Day 8 cycle 5 was held on 11-16 with ANC 0.9. She is due day 1 cycle 6 on 05-16-16. Most recent imaging was CT CAP on 03-19-16.  She is to see Dr Sabra Heck next 07-04-16.   Patient was very fatigued last week and spent a good deal of time in bed, but has felt better for last several days including doing some yard work. She denies increased SOB, or any cough, minimal discomfort now in area of previous left pleurex catheter. Appetite is good now, only occasional nausea. Bowels are moving regularly. No abdominal or pelvic pain. No orthostatic symptoms on present Florinef. No LE swelling. No bleeding. No fever or symptoms of infection. No problems with PAC. No increased peripheral neuropathy.  Remainder of 10 point Review of Systems negative.   Flu vaccine 04-05-16 PAC  Feraheme on 12-29-15, (?had IV iron also while in hospital for VATS)  She is concerned about status of medicaid/ medicare, and will discuss with [facility] financial staff today.  ONCOLOGIC HISTORY Patient had episode of post menopausal vaginal bleeding early 2015. Later in 2015 she had persistent vaginal discharge, for which she was seen by PCP Suzann Hedgecock in ~ 02-2014, had CTs in [facility] and was referred to Dr Adella Nissen, gyn in [facility]. Endometrial biopsy had concern (that path not included in information sent for consultation) such that she was referred to Dr  Jacquelyne Balint. Surgery by Dr Sabra Heck at [facility] in [place] on 04-06-14 was TAH, BSO, omentectomy, pelvic and right common iliac node evaluation; patient was hospitalized x 3 days and tells me that she recovered well from the surgery. 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 Bulk of the carcinoma was in lower uterine segment where it was within 0.1 cm of serosal surface. Cytology positive for adenocarcinoma on washings ([id number]). Surgical findings were significant for no paraaortic adenopathy apparent. Patient has received 3 cycles of adjuvant chemotherapy in [facility] by Dr Sabra Heck, on 05-05-14, 05-26-14 and 07-29-14. Per records and patient, delay of cycle 3 was related to low counts and insurance changes. She received neulasta after chemotherapy on 07-29-14. Taxol was given at 175 mg/m2 for total dose was 280 mg cycle 1 and 291 mg for cycles 2 and 3; carboplatin was AUC = 6 with total dose 610 mg cycle 1, 620 mg cycle 2 and 630 mg cycle 3.. She does not recall using oral decadron premed for taxol, with premeds listed on chemo flowsheets standard decadron 20 mg, zofran 16 mg, benadryl 50 mg (which caused severe restless legs) and pepcid 20 mg. Outside information does not include serial blood counts. Last note from Dr Sabra Heck dated 07-29-14 describes unremarkable exam, "NED during chemotherapy and  adequate PS". Patient was see by Dr Denman George 08-09-14, who recommends restaging scans after 6 cycles of chemotherapy and consideration of vaginal brachytherapy; she has not been seen by radiation oncology in [place]. CT AP done in [facility] system 08-15-14 (due to elevated LFTs and blood in urine just after transfer of  care) with soft tissue fullness at superior abdominal aorta and question of retroperitoneal necrotic adenopathy; plan repeat scans after complete present chemotherapy. Cycles 4 -6 carbo taxol given 08-24-14 thru 10-21-14, with gCSF support (last cycle carboplatin only due to peripheral neuropathy in feet). CT AP 12-25-14 without adenopathy or other apparent residual or recurrent malignancy.  Radiation treatment dates: July 19, July 27, July 29, August 9, August 11 Site/dose: Proximal vagina, 30 gray in 5 fractions Patient was hospitalized at [facility] 6-15 thru 12-06-15 after presenting with progressive SOB, with finding of large left pleural effusion. CT chest 11-30-15 showed new enlarged and necrotic right hilar and mediastinal nodes, multiple right pulmonary masses, large left pleural effusion with pleural mass, tiny lucent foci too small to characterize in thoracic vertebrae. She had thoracentesis by pulmonary for 1.5 liters on 11-30-15, then left pleurex catheter placed by IR on 12-04-15 with additional 800 cc removed then. Cytology [id number] adenocarcinoma (appeared serous per pathologist verbal report). CT AP 12-01-15 showed no new or suspicious liver findings, 1.2 cm left periaortic node, large nodal mass left pelvic sidewall 6.2 x 2.9 cm, no ascites or peritoneal changes, no apparent bony findings. She had day 1 cycle 1 dose dense carbo taxol on 01-04-16, with delay for counts and symptoms prior to changing regimen to day 1 day 8 every 21 days, with granix support. CT CAP 03-19-16 showed improvement after 3 cycles.   Anemia: hemoglobin ranged from 8.1 - 12.6 thru chemo, with MCV 103 when I met her after first chemo and up to 109 during treatment. Iron studies 08-2014 had serum iron 43, %sat 22, ferritin 429, B12 >2000 SIEP 10-2014 had no monoclonal protein (elevated IgG and IgA polyclonal). Urinalyses had microscopic hematuria, with cystoscopy by Dr Burman Nieves at [facility] 10-20-2014 unremarkable.  Haptoglobin 08-2014 not low (449), LDH normal, total bili occasionally elevated (2-29-26) but often normal. She was transfused 2 units PRBCs 09-2014 for hemoglobin 8.1.  Colonoscopy by Dr Oretha Caprice [facility] 05-23-15 unable to advance pediatric scope beyond sigmoid colon, which seemed to be fixed    Objective:  Vital signs in last 24 hours:  BP 123/79 (BP Location: Left Arm, Patient Position: Sitting)   Pulse 80   Temp 97.5 F (36.4 C) (Oral)   Resp 18   Ht 5' 8"  (1.727 m)   Wt 143 lb 3.2 oz (65 kg)   SpO2 99%   BMI 21.77 kg/m  Weight up 3 lbs. Alert, oriented and appropriate. Ambulatory without difficulty.  Looks comfortable, changes positions easily, respirations not labored RA.  HEENT:PERRL, sclerae not icteric. Oral mucosa moist without lesions, posterior pharynx clear.  Neck supple. No JVD.  Lymphatics:no cervical,supraclavicular or inguinal adenopathy Resp: clear to auscultation bilaterally and normal percussion other than dullness just in left base. Cardio: regular rate and rhythm. No gallop. GI: soft, nontender, not distended, no mass or organomegaly. Normally active bowel sounds. Surgical incision not remarkable. Musculoskeletal/ Extremities: without pitting edema, cords, tenderness Neuro: no increased peripheral neuropathy. Otherwise nonfocal. PSYCH appropriate mood and affect Skin without rash, ecchymosis, petechiae Portacath-without erythema or tenderness  Lab Results:  Results for orders placed or performed in visit on 05/13/16  CBC  with Differential  Result Value Ref Range   WBC 3.8 (L) 3.9 - 10.3 10e3/uL   NEUT# 2.0 1.5 - 6.5 10e3/uL   HGB 9.9 (L) 11.6 - 15.9 g/dL   HCT 30.3 (L) 34.8 - 46.6 %   Platelets 207 145 - 400 10e3/uL   MCV 112.0 (H) 79.5 - 101.0 fL   MCH 36.6 (H) 25.1 - 34.0 pg   MCHC 32.7 31.5 - 36.0 g/dL   RBC 2.71 (L) 3.70 - 5.45 10e6/uL   RDW 16.4 (H) 11.2 - 14.5 %   lymph# 1.4 0.9 - 3.3 10e3/uL   MONO# 0.2 0.1 - 0.9 10e3/uL   Eosinophils  Absolute 0.1 0.0 - 0.5 10e3/uL   Basophils Absolute 0.0 0.0 - 0.1 10e3/uL   NEUT% 51.9 38.4 - 76.8 %   LYMPH% 38.3 14.0 - 49.7 %   MONO% 5.7 0.0 - 14.0 %   EOS% 2.9 0.0 - 7.0 %   BASO% 1.2 0.0 - 2.0 %  Comprehensive metabolic panel  Result Value Ref Range   Sodium 143 136 - 145 mEq/L   Potassium 3.9 3.5 - 5.1 mEq/L   Chloride 107 98 - 109 mEq/L   CO2 27 22 - 29 mEq/L   Glucose 73 70 - 140 mg/dl   BUN 10.0 7.0 - 26.0 mg/dL   Creatinine 0.7 0.6 - 1.1 mg/dL   Total Bilirubin <0.22 0.20 - 1.20 mg/dL   Alkaline Phosphatase 129 40 - 150 U/L   AST 14 5 - 34 U/L   ALT 19 0 - 55 U/L   Total Protein 8.5 (H) 6.4 - 8.3 g/dL   Albumin 2.8 (L) 3.5 - 5.0 g/dL   Calcium 9.9 8.4 - 10.4 mg/dL   Anion Gap 9 3 - 11 mEq/L   EGFR >90 >90 ml/min/1.73 m2     Studies/Results:  No results found.  Medications: I have reviewed the patient's current medications. Refill florinef  DISCUSSION Clinically nothing of concern for progression of metastatic endometrial cancer now, tho counts are not tolerating the chemotherapy as well even with increased granix, and may be best to change to day 1 carbo still at AUC =4 with taxol at 80 mg/m2 and 2 days of granix at 480, every 21 days (no day 8).   As long as no changes necessary otherwise when she meets with financial staff, we will schedule further treatment and follow up on this schedule. OK to treat 11-30 by labs today.   Patient is in agreement with continuing treatment, other than concern about status of insurance.  Patient is aware that another medical oncologist will be assuming care sometime after first of year; physicians will continue to work collaboratively with Dr Sabra Heck.   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 Treatment in attempt to palliate and control disease.  Counts not tolerating dose dense carbo taxolday 1 day 8 every 21 days despite granix doses increased, so will modify regimen as above (short of decreasing carbo dose). Next visit Dr Sabra Heck Jan 2018  2.malignant left pleural effusion as well as involvement left pleura and lungs: post left VATS with sclerosis by Dr Prescott Gum 12-22-15. Still doing well 3.DNR patient's request. Advance Directives completed.  4.iron deficiency anemia: she had feraheme on 7-14 and possibly another dose in hospital early July. Transfused 2 units PRBCs early Aug. Will recheck iron studies 04-18-16 5.elevated total  protein and low albumin: SPEP previously no M spike. 24 hr urine 09-12-15 no M spike. 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 Continue to push fluids 8.PAC in 9. Long past tobacco: DCd 09-2015. Emphysematous changes on CT. Known to [name]  10. Microscopic hematuria: known to Dr Louis Meckel. Urine cytology negative  11.unable to visualize beyond sigmoid on colonoscopy 12-16 due to stricture  12.history substance abuse including ETOH and marijuana 13. long history anxiety and depression. [facility] chaplain knows her. 14.chemo cytopenias: carbo dose adjusted, using granix 480 days 2,3, 9 15. Restless legs with even 12.5 mg benadryl: DC benadryl from taxol premeds. Restless legs also with melatonin, DCd.  16.flu vaccine 04-05-16   All questions answered and she knows to call if needed prior to next scheduled visit. Chemo and granix orders adjusted. Scheduling message sent separately from visit due to financial aid counseling. Time spent 25 min including >50% counseling and coordination of care. Route PCP, cc Dr Sabra Heck   Evlyn Clines, MD   05/14/2016, 5:14 PM

## 2016-05-13 NOTE — Progress Notes (Signed)
Patient approved for one-time $400 [place]. Patient given a copy of approval letter along with our Outpatient pharmacy information and expense sheet. Patient received gas card today from grant. Patient verbalized understanding of how grant works.  Spent a lengthy time with patient discussing insurance,premiums,deductibles,OOP and copays. Patient took some notes to ask Medicaid caseworker and insurance providers. Patient has my card for any additional financial questions or concerns.

## 2016-05-15 ENCOUNTER — Telehealth: Payer: Self-pay

## 2016-05-15 NOTE — Telephone Encounter (Signed)
Reviewed the treatment plan change with Ms Kalk as noted below by Dr. Marko Plume. Ms Philipps agreeable to the new plan. Will pick up new schedule tomorrow with treatment.

## 2016-05-15 NOTE — Telephone Encounter (Signed)
-----   Message from Gordy Levan, MD sent at 05/14/2016  5:38 PM EST ----- RN please let patient know that I think we should try chemo just once every 3 weeks, same doses as we have used for [place] and taxol day 1 presently but no day 8 chemo. Will also give 3 doses of granix after the chemo this way. Hopefully her blood counts will tolerate this better.  Notes from financial counselor seen, appears ok to continue treatment as planned with chemo 11-30 and granix 12-1,2,3.  Scheduling message sent high priority: "No lab 11-30. Use lab from 11-27 for chemo on 11-30 granix injections 12-1, 12-2 AND 12-3 LL 12-14 or 12-18 with lab peripheral or from [facility] whichever she prefers"  thanks

## 2016-05-16 ENCOUNTER — Ambulatory Visit (HOSPITAL_BASED_OUTPATIENT_CLINIC_OR_DEPARTMENT_OTHER): Payer: Medicare Other

## 2016-05-16 ENCOUNTER — Other Ambulatory Visit: Payer: Medicare Other

## 2016-05-16 VITALS — BP 117/77 | HR 80 | Temp 98.0°F | Resp 18

## 2016-05-16 DIAGNOSIS — Z5111 Encounter for antineoplastic chemotherapy: Secondary | ICD-10-CM

## 2016-05-16 DIAGNOSIS — C541 Malignant neoplasm of endometrium: Secondary | ICD-10-CM | POA: Diagnosis not present

## 2016-05-16 MED ORDER — PALONOSETRON HCL INJECTION 0.25 MG/5ML
INTRAVENOUS | Status: AC
  Filled 2016-05-16: qty 5

## 2016-05-16 MED ORDER — SODIUM CHLORIDE 0.9% FLUSH
10.0000 mL | INTRAVENOUS | Status: DC | PRN
  Administered 2016-05-16: 10 mL
  Filled 2016-05-16: qty 10

## 2016-05-16 MED ORDER — FAMOTIDINE IN NACL 20-0.9 MG/50ML-% IV SOLN
20.0000 mg | Freq: Once | INTRAVENOUS | Status: AC
  Administered 2016-05-16: 20 mg via INTRAVENOUS

## 2016-05-16 MED ORDER — PALONOSETRON HCL INJECTION 0.25 MG/5ML
0.2500 mg | Freq: Once | INTRAVENOUS | Status: AC
  Administered 2016-05-16: 0.25 mg via INTRAVENOUS

## 2016-05-16 MED ORDER — FAMOTIDINE IN NACL 20-0.9 MG/50ML-% IV SOLN
INTRAVENOUS | Status: AC
  Filled 2016-05-16: qty 50

## 2016-05-16 MED ORDER — SODIUM CHLORIDE 0.9 % IV SOLN
Freq: Once | INTRAVENOUS | Status: AC
  Administered 2016-05-16: 11:00:00 via INTRAVENOUS

## 2016-05-16 MED ORDER — CARBOPLATIN CHEMO INTRADERMAL TEST DOSE 100MCG/0.02ML
100.0000 ug | Freq: Once | INTRADERMAL | Status: AC
  Administered 2016-05-16: 100 ug via INTRADERMAL
  Filled 2016-05-16: qty 0.01

## 2016-05-16 MED ORDER — SODIUM CHLORIDE 0.9 % IV SOLN
80.0000 mg/m2 | Freq: Once | INTRAVENOUS | Status: AC
  Administered 2016-05-16: 138 mg via INTRAVENOUS
  Filled 2016-05-16: qty 23

## 2016-05-16 MED ORDER — HEPARIN SOD (PORK) LOCK FLUSH 100 UNIT/ML IV SOLN
500.0000 [IU] | Freq: Once | INTRAVENOUS | Status: AC | PRN
  Administered 2016-05-16: 500 [IU]
  Filled 2016-05-16: qty 5

## 2016-05-16 MED ORDER — SODIUM CHLORIDE 0.9 % IV SOLN
20.0000 mg | Freq: Once | INTRAVENOUS | Status: AC
  Administered 2016-05-16: 20 mg via INTRAVENOUS
  Filled 2016-05-16: qty 2

## 2016-05-16 NOTE — Progress Notes (Signed)
Carbo test dose positive. Dr Marko Plume notified. Will proceed with Taxol only today

## 2016-05-16 NOTE — Patient Instructions (Signed)
Cancer Center Discharge Instructions for Patients Receiving Chemotherapy  Today you received the following chemotherapy agents Taxol  To help prevent nausea and vomiting after your treatment, we encourage you to take your nausea medication as needed   If you develop nausea and vomiting that is not controlled by your nausea medication, call the clinic.   BELOW ARE SYMPTOMS THAT SHOULD BE REPORTED IMMEDIATELY:  *FEVER GREATER THAN 100.5 F  *CHILLS WITH OR WITHOUT FEVER  NAUSEA AND VOMITING THAT IS NOT CONTROLLED WITH YOUR NAUSEA MEDICATION  *UNUSUAL SHORTNESS OF BREATH  *UNUSUAL BRUISING OR BLEEDING  TENDERNESS IN MOUTH AND THROAT WITH OR WITHOUT PRESENCE OF ULCERS  *URINARY PROBLEMS  *BOWEL PROBLEMS  UNUSUAL RASH Items with * indicate a potential emergency and should be followed up as soon as possible.  Feel free to call the clinic you have any questions or concerns. The clinic phone number is [phone].  Please show the CHEMO ALERT CARD at check-in to the Emergency Department and triage nurse.   

## 2016-05-17 ENCOUNTER — Other Ambulatory Visit: Payer: Self-pay | Admitting: *Deleted

## 2016-05-17 ENCOUNTER — Ambulatory Visit (HOSPITAL_BASED_OUTPATIENT_CLINIC_OR_DEPARTMENT_OTHER): Payer: Medicare Other

## 2016-05-17 VITALS — BP 117/67 | HR 85 | Temp 97.6°F | Resp 20

## 2016-05-17 DIAGNOSIS — C541 Malignant neoplasm of endometrium: Secondary | ICD-10-CM

## 2016-05-17 DIAGNOSIS — C7802 Secondary malignant neoplasm of left lung: Secondary | ICD-10-CM | POA: Diagnosis not present

## 2016-05-17 DIAGNOSIS — C782 Secondary malignant neoplasm of pleura: Secondary | ICD-10-CM

## 2016-05-17 DIAGNOSIS — C7801 Secondary malignant neoplasm of right lung: Secondary | ICD-10-CM

## 2016-05-17 DIAGNOSIS — Z5189 Encounter for other specified aftercare: Secondary | ICD-10-CM

## 2016-05-17 MED ORDER — TBO-FILGRASTIM 480 MCG/0.8ML ~~LOC~~ SOSY
480.0000 ug | PREFILLED_SYRINGE | Freq: Once | SUBCUTANEOUS | Status: AC
  Administered 2016-05-17: 480 ug via SUBCUTANEOUS
  Filled 2016-05-17: qty 0.8

## 2016-05-17 NOTE — Patient Instructions (Signed)

## 2016-05-18 ENCOUNTER — Ambulatory Visit (HOSPITAL_BASED_OUTPATIENT_CLINIC_OR_DEPARTMENT_OTHER): Payer: Medicare Other

## 2016-05-18 VITALS — BP 105/70 | HR 85 | Temp 97.8°F | Resp 20

## 2016-05-18 DIAGNOSIS — C7801 Secondary malignant neoplasm of right lung: Secondary | ICD-10-CM | POA: Diagnosis not present

## 2016-05-18 DIAGNOSIS — C7802 Secondary malignant neoplasm of left lung: Secondary | ICD-10-CM

## 2016-05-18 DIAGNOSIS — Z5189 Encounter for other specified aftercare: Secondary | ICD-10-CM | POA: Diagnosis not present

## 2016-05-18 DIAGNOSIS — C782 Secondary malignant neoplasm of pleura: Secondary | ICD-10-CM | POA: Diagnosis not present

## 2016-05-18 DIAGNOSIS — C541 Malignant neoplasm of endometrium: Secondary | ICD-10-CM | POA: Diagnosis present

## 2016-05-18 MED ORDER — TBO-FILGRASTIM 480 MCG/0.8ML ~~LOC~~ SOSY
480.0000 ug | PREFILLED_SYRINGE | Freq: Once | SUBCUTANEOUS | Status: AC
  Administered 2016-05-18: 480 ug via SUBCUTANEOUS

## 2016-05-20 ENCOUNTER — Ambulatory Visit (HOSPITAL_BASED_OUTPATIENT_CLINIC_OR_DEPARTMENT_OTHER): Payer: Medicare Other

## 2016-05-20 VITALS — BP 118/80 | HR 88 | Temp 97.7°F | Resp 18

## 2016-05-20 DIAGNOSIS — C541 Malignant neoplasm of endometrium: Secondary | ICD-10-CM

## 2016-05-20 DIAGNOSIS — C7802 Secondary malignant neoplasm of left lung: Secondary | ICD-10-CM | POA: Diagnosis not present

## 2016-05-20 DIAGNOSIS — C782 Secondary malignant neoplasm of pleura: Secondary | ICD-10-CM | POA: Diagnosis not present

## 2016-05-20 DIAGNOSIS — Z5189 Encounter for other specified aftercare: Secondary | ICD-10-CM

## 2016-05-20 DIAGNOSIS — C7801 Secondary malignant neoplasm of right lung: Secondary | ICD-10-CM | POA: Diagnosis not present

## 2016-05-20 MED ORDER — TBO-FILGRASTIM 480 MCG/0.8ML ~~LOC~~ SOSY
480.0000 ug | PREFILLED_SYRINGE | Freq: Once | SUBCUTANEOUS | Status: AC
  Administered 2016-05-20: 480 ug via SUBCUTANEOUS
  Filled 2016-05-20: qty 0.8

## 2016-05-20 MED ORDER — FLUDROCORTISONE ACETATE 0.1 MG PO TABS: 0.1000 mg | ORAL_TABLET | Freq: Every day | ORAL | 2 refills | Status: DC

## 2016-05-20 NOTE — Patient Instructions (Signed)

## 2016-06-02 ENCOUNTER — Other Ambulatory Visit: Payer: Self-pay | Admitting: Oncology

## 2016-06-03 ENCOUNTER — Encounter: Payer: Self-pay | Admitting: Oncology

## 2016-06-03 ENCOUNTER — Ambulatory Visit (HOSPITAL_BASED_OUTPATIENT_CLINIC_OR_DEPARTMENT_OTHER): Payer: Medicare Other | Admitting: Oncology

## 2016-06-03 ENCOUNTER — Ambulatory Visit (HOSPITAL_BASED_OUTPATIENT_CLINIC_OR_DEPARTMENT_OTHER): Payer: Medicare Other

## 2016-06-03 ENCOUNTER — Other Ambulatory Visit (HOSPITAL_BASED_OUTPATIENT_CLINIC_OR_DEPARTMENT_OTHER): Payer: Medicare Other

## 2016-06-03 VITALS — BP 124/84 | HR 89 | Temp 98.4°F | Resp 18 | Ht 68.0 in | Wt 141.9 lb

## 2016-06-03 DIAGNOSIS — C7801 Secondary malignant neoplasm of right lung: Secondary | ICD-10-CM

## 2016-06-03 DIAGNOSIS — C7802 Secondary malignant neoplasm of left lung: Secondary | ICD-10-CM

## 2016-06-03 DIAGNOSIS — J910 Malignant pleural effusion: Secondary | ICD-10-CM | POA: Diagnosis not present

## 2016-06-03 DIAGNOSIS — D500 Iron deficiency anemia secondary to blood loss (chronic): Secondary | ICD-10-CM

## 2016-06-03 DIAGNOSIS — C541 Malignant neoplasm of endometrium: Secondary | ICD-10-CM

## 2016-06-03 DIAGNOSIS — C782 Secondary malignant neoplasm of pleura: Secondary | ICD-10-CM | POA: Diagnosis not present

## 2016-06-03 DIAGNOSIS — D509 Iron deficiency anemia, unspecified: Secondary | ICD-10-CM | POA: Diagnosis not present

## 2016-06-03 DIAGNOSIS — G620 Drug-induced polyneuropathy: Secondary | ICD-10-CM

## 2016-06-03 DIAGNOSIS — Z95828 Presence of other vascular implants and grafts: Secondary | ICD-10-CM

## 2016-06-03 DIAGNOSIS — T451X5A Adverse effect of antineoplastic and immunosuppressive drugs, initial encounter: Secondary | ICD-10-CM

## 2016-06-03 DIAGNOSIS — Z5111 Encounter for antineoplastic chemotherapy: Secondary | ICD-10-CM

## 2016-06-03 LAB — CBC WITH DIFFERENTIAL/PLATELET
BASO%: 0.9 % (ref 0.0–2.0)
BASOS ABS: 0 10*3/uL (ref 0.0–0.1)
EOS ABS: 0 10*3/uL (ref 0.0–0.5)
EOS%: 1 % (ref 0.0–7.0)
HCT: 31.6 % — ABNORMAL LOW (ref 34.8–46.6)
HGB: 10.4 g/dL — ABNORMAL LOW (ref 11.6–15.9)
LYMPH%: 45.3 % (ref 14.0–49.7)
MCH: 36.6 pg — AB (ref 25.1–34.0)
MCHC: 32.9 g/dL (ref 31.5–36.0)
MCV: 111.4 fL — AB (ref 79.5–101.0)
MONO#: 0.3 10*3/uL (ref 0.1–0.9)
MONO%: 7 % (ref 0.0–14.0)
NEUT%: 45.8 % (ref 38.4–76.8)
NEUTROS ABS: 1.7 10*3/uL (ref 1.5–6.5)
Platelets: 320 10*3/uL (ref 145–400)
RBC: 2.83 10*6/uL — AB (ref 3.70–5.45)
RDW: 15 % — ABNORMAL HIGH (ref 11.2–14.5)
WBC: 3.7 10*3/uL — AB (ref 3.9–10.3)
lymph#: 1.7 10*3/uL (ref 0.9–3.3)

## 2016-06-03 LAB — COMPREHENSIVE METABOLIC PANEL
ALT: 13 U/L (ref 0–55)
AST: 11 U/L (ref 5–34)
Albumin: 2.9 g/dL — ABNORMAL LOW (ref 3.5–5.0)
Alkaline Phosphatase: 131 U/L (ref 40–150)
Anion Gap: 9 mEq/L (ref 3–11)
BUN: 10.5 mg/dL (ref 7.0–26.0)
CHLORIDE: 105 meq/L (ref 98–109)
CO2: 26 meq/L (ref 22–29)
Calcium: 9.7 mg/dL (ref 8.4–10.4)
Creatinine: 0.8 mg/dL (ref 0.6–1.1)
GLUCOSE: 142 mg/dL — AB (ref 70–140)
POTASSIUM: 3.6 meq/L (ref 3.5–5.1)
SODIUM: 139 meq/L (ref 136–145)
Total Bilirubin: <0.22 mg/dL (ref 0.20–1.20)
Total Protein: 8.9 g/dL — ABNORMAL HIGH (ref 6.4–8.3)

## 2016-06-03 MED ORDER — SODIUM CHLORIDE 0.9 % IJ SOLN
10.0000 mL | INTRAMUSCULAR | Status: DC | PRN
  Administered 2016-06-03: 10 mL via INTRAVENOUS
  Filled 2016-06-03: qty 10

## 2016-06-03 MED ORDER — HEPARIN SOD (PORK) LOCK FLUSH 100 UNIT/ML IV SOLN
500.0000 [IU] | Freq: Once | INTRAVENOUS | Status: AC | PRN
  Administered 2016-06-03: 500 [IU] via INTRAVENOUS
  Filled 2016-06-03: qty 5

## 2016-06-03 NOTE — Progress Notes (Signed)
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  Last imaging was CT CAP 03-19-16. She is to see Dr Jacquelyne Balint on 07-04-16  Treatment on 05-16-16 was taxol only after she had reaction to [place] skin test that day; she had granix following, with more aches likely from less marrow suppression with taxol only. Patient reports that she "stayed in bed for a week with the aches". Since then she has felt well, with good appetite, no SOB or cough, no abdominal or pelvic discomfort, energy good. She has had no fever or symptoms of infection, no problems with PAC, no bleeding.  Remainder of 10 point Review of Systems negative  I do not know that MSH/ MSI testing was done on surgical path (surgery by Dr Sabra Heck) Flu vaccine 04-05-16 PAC  Feraheme on 12-29-15  [facility] financial staff has assisted with some grants, patient very appreciative.  ONCOLOGIC HISTORY Patient had episode of post menopausal vaginal bleeding early 2015. Later in 2015 she had persistent vaginal discharge, for which she was seen by PCP Suzann Hedgecock in ~ 02-2014, had CTs in [facility] and was referred to Dr Adella Nissen, gyn in [facility]. Endometrial biopsy had concern (that path not included in information sent for consultation) such that she was referred to Dr Jacquelyne Balint. Surgery by Dr Sabra Heck at [facility] in [place] on 04-06-14 was TAH, BSO, omentectomy,  pelvic and right common iliac node evaluation; patient was hospitalized x 3 days and tells me that she recovered well from the surgery. 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 Bulk of the carcinoma was in lower uterine segment where it was within 0.1 cm of serosal surface. Cytology positive for adenocarcinoma on washings ([id number]). Surgical findings were significant for no paraaortic adenopathy apparent. Patient has received 3 cycles of adjuvant chemotherapy in [facility] by Dr Sabra Heck, on 05-05-14, 05-26-14 and 07-29-14. Per records and patient, delay of cycle 3 was related to low counts and insurance changes. She received neulasta after chemotherapy on 07-29-14. Taxol was given at 175 mg/m2 for total dose was 280 mg cycle 1 and 291 mg for cycles 2 and 3; carboplatin was AUC = 6 with total dose 610 mg cycle 1, 620 mg cycle 2 and 630 mg cycle 3.. She does not recall using oral decadron premed for taxol, with premeds listed on chemo flowsheets standard decadron 20 mg, zofran 16 mg, benadryl 50 mg (which caused severe restless legs) and pepcid 20 mg. Outside information does not include serial blood counts. Last note from Dr Sabra Heck dated 07-29-14 describes unremarkable exam, "NED during chemotherapy and adequate PS". Patient was see by Dr Denman George 08-09-14, who recommends restaging scans after 6 cycles of chemotherapy and consideration  of vaginal brachytherapy; she has not been seen by radiation oncology in [place]. CT AP done in [facility] system 08-15-14 (due to elevated LFTs and blood in urine just after transfer of care) with soft tissue fullness at superior abdominal aorta and question of retroperitoneal necrotic  adenopathy; plan repeat scans after complete present chemotherapy. Cycles 4 -6 carbo taxol given 08-24-14 thru 10-21-14, with gCSF support (last cycle carboplatin only due to peripheral neuropathy in feet). CT AP 12-25-14 without adenopathy or other apparent residual or recurrent malignancy.  Radiation treatment dates: July 19, July 27, July 29, August 9, August 11 Site/dose: Proximal vagina, 30 gray in 5 fractions Patient was hospitalized at [facility] 6-15 thru 12-06-15 after presenting with progressive SOB, with finding of large left pleural effusion. CT chest 11-30-15 showed new enlarged and necrotic right hilar and mediastinal nodes, multiple right pulmonary masses, large left pleural effusion with pleural mass, tiny lucent foci too small to characterize in thoracic vertebrae. She had thoracentesis by pulmonary for 1.5 liters on 11-30-15, then left pleurex catheter placed by IR on 12-04-15 with additional 800 cc removed then. Cytology [id number] adenocarcinoma (appeared serous per pathologist verbal report). CT AP 12-01-15 showed no new or suspicious liver findings, 1.2 cm left periaortic node, large nodal mass left pelvic sidewall 6.2 x 2.9 cm, no ascites or peritoneal changes, no apparent bony findings. She had day 1 cycle 1 dose dense carbo taxol on 01-04-16, with delay for counts and symptoms prior to changing regimen to day 1 day 8 every 21 days, with granix support. CT CAP 03-19-16 showed improvement after 3 cycles. She continued with reduced "dose dense" carbo taxol thru 05-16-16, with reaction to [place] skin test that day.   Anemia: hemoglobin ranged from 8.1 - 12.6 thru chemo, with MCV 103 when I met her after first chemo and up to 109 during treatment. Iron studies 08-2014 had serum iron 43, %sat 22, ferritin 429, B12 >2000 SIEP 10-2014 had no monoclonal protein (elevated IgG and IgA polyclonal). Urinalyses had microscopic hematuria, with cystoscopy by Dr Burman Nieves at [facility] 10-20-2014  unremarkable. Haptoglobin 08-2014 not low (449), LDH normal, total bili occasionally elevated (2-29-26) but often normal. She was transfused 2 units PRBCs 09-2014 for hemoglobin 8.1.  Colonoscopy by Dr Oretha Caprice [facility] 05-23-15 unable to advance pediatric scope beyond sigmoid colon, which seemed to be fixed   Objective:  Vital signs in last 24 hours:  BP 124/84 (BP Location: Left Arm, Patient Position: Sitting)   Pulse 89   Temp 98.4 F (36.9 C) (Oral)   Resp 18   Ht 5' 8"  (1.727 m)   Wt 141 lb 14.4 oz (64.4 kg)   SpO2 100%   BMI 21.58 kg/m  Weight down 1 lb Alert, oriented and appropriate. Ambulatory without difficulty.   HEENT:PERRL, sclerae not icteric. Oral mucosa moist without lesions, posterior pharynx clear.  Neck supple. No JVD.  Lymphatics:no cervical,supraclavicular or inguinal adenopathy Resp: clear to auscultation bilaterally and normal percussion bilaterally Cardio: regular rate and rhythm. No gallop. GI: soft, nontender, not distended, no mass or organomegaly. Normally active bowel sounds. Surgical incision not remarkable. Musculoskeletal/ Extremities: LE without pitting edema, cords, tenderness Neuro: no increase in peripheral neuropathy. Otherwise nonfocal. PSYCH mood and affect good Skin without rash, ecchymosis, petechiae Portacath-without erythema or tenderness  Lab Results:  Results for orders placed or performed in visit on 06/03/16  CBC with Differential  Result Value Ref Range   WBC 3.7 (L) 3.9 - 10.3 10e3/uL  NEUT# 1.7 1.5 - 6.5 10e3/uL   HGB 10.4 (L) 11.6 - 15.9 g/dL   HCT 31.6 (L) 34.8 - 46.6 %   Platelets 320 145 - 400 10e3/uL   MCV 111.4 (H) 79.5 - 101.0 fL   MCH 36.6 (H) 25.1 - 34.0 pg   MCHC 32.9 31.5 - 36.0 g/dL   RBC 2.83 (L) 3.70 - 5.45 10e6/uL   RDW 15.0 (H) 11.2 - 14.5 %   lymph# 1.7 0.9 - 3.3 10e3/uL   MONO# 0.3 0.1 - 0.9 10e3/uL   Eosinophils Absolute 0.0 0.0 - 0.5 10e3/uL   Basophils Absolute 0.0 0.0 - 0.1 10e3/uL   NEUT%  45.8 38.4 - 76.8 %   LYMPH% 45.3 14.0 - 49.7 %   MONO% 7.0 0.0 - 14.0 %   EOS% 1.0 0.0 - 7.0 %   BASO% 0.9 0.0 - 2.0 %  Comprehensive metabolic panel  Result Value Ref Range   Sodium 139 136 - 145 mEq/L   Potassium 3.6 3.5 - 5.1 mEq/L   Chloride 105 98 - 109 mEq/L   CO2 26 22 - 29 mEq/L   Glucose 142 (H) 70 - 140 mg/dl   BUN 10.5 7.0 - 26.0 mg/dL   Creatinine 0.8 0.6 - 1.1 mg/dL   Total Bilirubin <0.22 0.20 - 1.20 mg/dL   Alkaline Phosphatase 131 40 - 150 U/L   AST 11 5 - 34 U/L   ALT 13 0 - 55 U/L   Total Protein 8.9 (H) 6.4 - 8.3 g/dL   Albumin 2.9 (L) 3.5 - 5.0 g/dL   Calcium 9.7 8.4 - 10.4 mg/dL   Anion Gap 9 3 - 11 mEq/L   EGFR >90 >90 ml/min/1.73 m2    Hgb up from 9.9   Studies/Results:  Will get CT CAP prior to Jan appointment with Dr Sabra Heck.   Medications: I have reviewed the patient's current medications. Continue oral iron  DISCUSSION  Clinically doing well and seems very reasonable to take treatment break until reevaluation with CT CAP in Jan prior to seeing Dr Sabra Heck.  Unless desensitized, should not have further carboplatin,  as she had clear reaction to skin test 05-16-16. Only chemo that she has had adjuvant and since recurrence has been taxol carboplatin. Doxil may be reasonable when further treatment needed.   Lab 04-18-16 had serum iron 41 and %sat 19. As Hb is better off of carboplatin, she will continue oral iron for now in preference to another IV feraheme.  Patient is aware that another medical oncologist will be involved at [facility] after Jan.  Note she is well known to Dr Jacquelyne Balint also.   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 Clinically has responded to low doses of carbo taxol, which counts have tolerated. Carbo skin test reaction 05-16-16. Will take treatment  break until restaging scans in Jan, follow up with Dr Sabra Heck then and I will also see her in Jan.  2.malignant left pleural effusion as well as involvement left pleura and lungs: post left VATS with sclerosis by Dr Prescott Gum 12-22-15. Doing well 3.DNR patient's request. Advance Directives completed.  4.iron deficiency anemia: she had feraheme on 7-14 and possibly another dose in hospital early July. Transfused 2 units PRBCs early Aug.  Iron studies 04-18-16 still low, however hgb better off carboplatin and she continues oral iron, follow. 5.elevated total protein and  low albumin: SPEP previously no M spike. 24 hr urine 09-12-15 no M spike. 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 Continue to push fluids 8.PAC in 9. Long past tobacco: DCd 09-2015. Emphysematous changes on CT. Known to [name] [facility] pulmonary) 10. Microscopic hematuria: known to Dr Louis Meckel. Urine cytology negative  11.unable to visualize beyond sigmoid on colonoscopy 12-16 due to stricture  12.history substance abuse including ETOH and marijuana 13. long history anxiety and depression. [place] chaplain knows her. 14.chemo cytopenias 15. Restless legs with benadryl  16.flu vaccine 04-05-16    All questions answered and patient knows to call if concerns prior to next visit. Message sent to managed care for CT preauth. Route PCP, cc  Dr Hazle Nordmann. Time spent 25 min including >50% counseling and coordination of care.   Evlyn Clines, MD   06/03/2016, 5:31 PM

## 2016-06-04 DIAGNOSIS — Z5111 Encounter for antineoplastic chemotherapy: Secondary | ICD-10-CM | POA: Insufficient documentation

## 2016-06-05 ENCOUNTER — Telehealth: Payer: Self-pay | Admitting: Oncology

## 2016-06-05 NOTE — Telephone Encounter (Signed)
sw pt to confirm next follow up appts for Jul 01 2016 per [facility] LOS. Pt also aware of cxl infusion appt on Jan 11

## 2016-06-06 ENCOUNTER — Ambulatory Visit: Payer: Medicare Other

## 2016-06-06 ENCOUNTER — Encounter: Payer: Medicare Other | Admitting: Nutrition

## 2016-06-19 ENCOUNTER — Ambulatory Visit (HOSPITAL_COMMUNITY): Payer: Medicare Other

## 2016-06-21 ENCOUNTER — Ambulatory Visit (HOSPITAL_COMMUNITY)
Admission: RE | Admit: 2016-06-21 | Discharge: 2016-06-21 | Disposition: A | Discharge status: Home / Self Care | Payer: Medicare Other | Source: Ambulatory Visit | Attending: Oncology | Admitting: Oncology

## 2016-06-21 DIAGNOSIS — C541 Malignant neoplasm of endometrium: Secondary | ICD-10-CM | POA: Insufficient documentation

## 2016-06-21 DIAGNOSIS — J90 Pleural effusion, not elsewhere classified: Secondary | ICD-10-CM | POA: Insufficient documentation

## 2016-06-21 DIAGNOSIS — R590 Localized enlarged lymph nodes: Secondary | ICD-10-CM | POA: Insufficient documentation

## 2016-06-21 MED ORDER — IOPAMIDOL (ISOVUE-300) INJECTION 61%
30.0000 mL | Freq: Once | INTRAVENOUS | Status: AC | PRN
  Administered 2016-06-21: 30 mL via ORAL

## 2016-06-21 MED ORDER — IOPAMIDOL (ISOVUE-300) INJECTION 61%
INTRAVENOUS | Status: AC
  Administered 2016-06-21: 100 mL via INTRAVENOUS
  Filled 2016-06-21: qty 100

## 2016-06-21 MED ORDER — IOPAMIDOL (ISOVUE-300) INJECTION 61%
INTRAVENOUS | Status: AC
  Filled 2016-06-21: qty 30

## 2016-06-27 ENCOUNTER — Ambulatory Visit: Payer: Medicare Other

## 2016-06-30 ENCOUNTER — Other Ambulatory Visit: Payer: Self-pay | Admitting: Oncology

## 2016-07-01 ENCOUNTER — Encounter: Payer: Self-pay | Admitting: Oncology

## 2016-07-01 ENCOUNTER — Other Ambulatory Visit (HOSPITAL_BASED_OUTPATIENT_CLINIC_OR_DEPARTMENT_OTHER): Payer: Medicare Other

## 2016-07-01 ENCOUNTER — Ambulatory Visit (HOSPITAL_BASED_OUTPATIENT_CLINIC_OR_DEPARTMENT_OTHER): Payer: Medicare Other

## 2016-07-01 ENCOUNTER — Ambulatory Visit (HOSPITAL_BASED_OUTPATIENT_CLINIC_OR_DEPARTMENT_OTHER): Payer: Medicare Other | Admitting: Oncology

## 2016-07-01 VITALS — BP 126/86 | HR 95 | Temp 97.7°F | Resp 18 | Wt 141.8 lb

## 2016-07-01 DIAGNOSIS — D509 Iron deficiency anemia, unspecified: Secondary | ICD-10-CM

## 2016-07-01 DIAGNOSIS — M79622 Pain in left upper arm: Secondary | ICD-10-CM

## 2016-07-01 DIAGNOSIS — Z95828 Presence of other vascular implants and grafts: Secondary | ICD-10-CM

## 2016-07-01 DIAGNOSIS — C541 Malignant neoplasm of endometrium: Secondary | ICD-10-CM

## 2016-07-01 DIAGNOSIS — C7802 Secondary malignant neoplasm of left lung: Secondary | ICD-10-CM

## 2016-07-01 DIAGNOSIS — Z79899 Other long term (current) drug therapy: Secondary | ICD-10-CM

## 2016-07-01 DIAGNOSIS — C782 Secondary malignant neoplasm of pleura: Secondary | ICD-10-CM | POA: Diagnosis not present

## 2016-07-01 DIAGNOSIS — G620 Drug-induced polyneuropathy: Secondary | ICD-10-CM

## 2016-07-01 DIAGNOSIS — C7801 Secondary malignant neoplasm of right lung: Secondary | ICD-10-CM

## 2016-07-01 DIAGNOSIS — I959 Hypotension, unspecified: Secondary | ICD-10-CM | POA: Diagnosis not present

## 2016-07-01 DIAGNOSIS — C7800 Secondary malignant neoplasm of unspecified lung: Secondary | ICD-10-CM

## 2016-07-01 LAB — COMPREHENSIVE METABOLIC PANEL
ALT: 11 U/L (ref 0–55)
ANION GAP: 10 meq/L (ref 3–11)
AST: 13 U/L (ref 5–34)
Albumin: 3.1 g/dL — ABNORMAL LOW (ref 3.5–5.0)
Alkaline Phosphatase: 113 U/L (ref 40–150)
BUN: 10.1 mg/dL (ref 7.0–26.0)
CHLORIDE: 105 meq/L (ref 98–109)
CO2: 27 meq/L (ref 22–29)
CREATININE: 0.8 mg/dL (ref 0.6–1.1)
Calcium: 10.3 mg/dL (ref 8.4–10.4)
EGFR: >90 mL/min/{1.73_m2} (ref >90)
Glucose: 145 mg/dl — ABNORMAL HIGH (ref 70–140)
POTASSIUM: 4 meq/L (ref 3.5–5.1)
Sodium: 142 mEq/L (ref 136–145)
Total Bilirubin: 0.22 mg/dL (ref 0.20–1.20)
Total Protein: 9 g/dL — ABNORMAL HIGH (ref 6.4–8.3)

## 2016-07-01 LAB — CBC WITH DIFFERENTIAL/PLATELET
BASO%: 1.2 % (ref 0.0–2.0)
BASOS ABS: 0.1 10*3/uL (ref 0.0–0.1)
EOS%: 1.8 % (ref 0.0–7.0)
Eosinophils Absolute: 0.1 10*3/uL (ref 0.0–0.5)
HCT: 33.6 % — ABNORMAL LOW (ref 34.8–46.6)
HGB: 11.1 g/dL — ABNORMAL LOW (ref 11.6–15.9)
LYMPH%: 26.7 % (ref 14.0–49.7)
MCH: 35.6 pg — AB (ref 25.1–34.0)
MCHC: 33.1 g/dL (ref 31.5–36.0)
MCV: 107.5 fL — ABNORMAL HIGH (ref 79.5–101.0)
MONO#: 0.3 10*3/uL (ref 0.1–0.9)
MONO%: 6 % (ref 0.0–14.0)
NEUT#: 3.1 10*3/uL (ref 1.5–6.5)
NEUT%: 64.3 % (ref 38.4–76.8)
PLATELETS: 317 10*3/uL (ref 145–400)
RBC: 3.12 10*6/uL — AB (ref 3.70–5.45)
RDW: 14.7 % — ABNORMAL HIGH (ref 11.2–14.5)
WBC: 4.8 10*3/uL (ref 3.9–10.3)
lymph#: 1.3 10*3/uL (ref 0.9–3.3)

## 2016-07-01 MED ORDER — HEPARIN SOD (PORK) LOCK FLUSH 100 UNIT/ML IV SOLN
500.0000 [IU] | Freq: Once | INTRAVENOUS | Status: AC | PRN
  Administered 2016-07-01: 500 [IU] via INTRAVENOUS
  Filled 2016-07-01: qty 5

## 2016-07-01 MED ORDER — SODIUM CHLORIDE 0.9 % IJ SOLN
10.0000 mL | INTRAMUSCULAR | Status: DC | PRN
  Administered 2016-07-01: 10 mL via INTRAVENOUS
  Filled 2016-07-01: qty 10

## 2016-07-01 NOTE — Progress Notes (Signed)
OFFICE PROGRESS NOTE   July 01, 2016  Physicians:  Jacquelyne Balint, Everitt Amber, Nunzio Cobbs Hedgecock(PCP, [facility] [employer]), _ [name] [facility]); Louis Meckel ([facility]), [name] , Owens Loffler ([facility]); Praveen Mannam ([facility]), P.Van Trigt   INTERVAL HISTORY:  Patient is seen, alone for visit, as we consider next interventions for metastatic endometrial cancer involving lungs. She has been on short treatment break for holidays, since [place] taxol 01-04-16 thru 05-16-16. CT CAP 06-22-15 shows some mixed response compared with CT 03-19-16. She saw Dr Jacquelyne Balint on 03-24-16, next visit ~ Feb.  Patient enjoyed being with family for holidays. Energy is not great, may be in part deconditioning as she is also not getting any regular exercise. Appetite is OK without nausea or vomiting, bowels moving regularly, no increased SOB with present exertion, no cough or chest pain. No abdominal or pelvic pain. Bladder ok. No bleeding, no abdominal or pelvic pain, no LE swelling, no fever or symptoms of infection, no problems with PAC. Some soreness left upper arm laterally. No worsening peripheral neuropathy. No orthostatic symptoms. Remainder of 14 point Review of Systems negative.   No information on MSH/ MSI testing on surgical path in [place] in [facility] Flu vaccine 04-05-16 PAC flushed 07-01-16 Feraheme on 12-29-15  ONCOLOGIC HISTORY  Patient had post menopausal vaginal bleeding early 2015, then persistent vaginal discharge, for which she was seen by PCP  ~ 02-2014, had CTs in [facility] and was referred to Dr Adella Nissen, gyn in [facility] with endometrial biopsy. Surgery by Dr Jacquelyne Balint at [facility] in [place] on 04-06-14 TAH, BSO, omentectomy, pelvic and right common iliac node evaluation. 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 Bulk of the carcinoma was in lower uterine segment where it was within 0.1 cm of serosal surface. Cytology positive for adenocarcinoma on washings ([id number]). Surgical findings were significant for no paraaortic adenopathy apparent. Patient received 3 cycles of adjuvant chemotherapy in [place] by Dr Sabra Heck from 05-05-14 thru 07-29-14, with neulasta. Per Dr Sabra Heck 07-29-14  "NED during chemotherapy and adequate PS". Patient was see by Dr Denman George 08-09-14, who recommends restaging scans after 6 cycles of chemotherapy and consideration of vaginal brachytherapy. CT AP done in [facility] 08-15-14 (due to elevated LFTs and blood in urine just after transfer of care) with soft tissue fullness at superior abdominal aorta and question of retroperitoneal necrotic adenopathy; plan repeat scans after complete present chemotherapy. Cycles 4 -6 carbo taxol given 08-24-14 thru 10-21-14, with gCSF support (last cycle carboplatin only due to peripheral neuropathy in feet). CT AP 12-25-14 without adenopathy or other apparent residual or recurrent malignancy.  Radiation treatment dates: July 19, July 27, July 29, August 9, August 11 Site/dose: Proximal vagina, 30 gray in 5 fractions Patient was hospitalized at [facility] 11-2015 with progressive SOB, with large left pleural effusion. CT chest 11-30-15 showed new enlarged and necrotic right hilar and mediastinal nodes, multiple right pulmonary masses, large left pleural effusion with pleural mass, tiny lucent foci too small to characterize in thoracic vertebrae.  Cytology of pleural fluid [id number] adenocarcinoma (appeared serous per pathologist verbal report). CT AP 12-01-15 showed no new or suspicious liver findings,  1.2 cm left periaortic node, large nodal mass left  pelvic sidewall 6.2 x 2.9 cm, no ascites or peritoneal changes, no apparent bony findings. She had day 1 cycle 1 dose dense carbo taxol on 01-04-16, with delay for counts and symptoms prior to changing regimen to day 1 day 8 every 21 days, with granix support. CT CAP 03-19-16 showed improvement after 3 cycles. She continued with reduced "dose dense" carbo taxol thru 05-16-16, with reaction to [place] skin then. CT CAP 06-21-16 mixed response in chest, with increased central nodes and pleural disease, mixed response in pulmonary nodules, no bone lesions, no increased retroperitoneal nodes, no hydronephrosis and bladder ok.  Anemia: Iron studies 08-2014 had serum iron 43, %sat 22, ferritin 429, B12 >2000 SIEP 10-2014 had no monoclonal protein (elevated IgG and IgA polyclonal). Urinalyses had microscopic hematuria, with cystoscopy by Dr Burman Nieves at [facility] 10-20-2014 unremarkable. Haptoglobin 08-2014 not low (449),   Colonoscopy by Dr Oretha Caprice [facility] 05-23-15 unable to advance pediatric scope beyond sigmoid colon, which was fixed    Objective:  Vital signs in last 24 hours:  BP 126/86 (BP Location: Left Arm, Patient Position: Sitting)   Pulse 95   Temp 97.7 F (36.5 C) (Oral)   Resp 18   Wt 141 lb 12.8 oz (64.3 kg)   SpO2 100%   BMI 21.56 kg/m   Alert, oriented and appropriate, looks comfortable, very pleasant and soft spoken as always. Ambulatory without difficulty.   HEENT:PERRL, sclerae not icteric. Oral mucosa moist without lesions, posterior pharynx clear.  Neck supple. No JVD.  Lymphatics:no cervical,supraclavicular or inguinal adenopathy Resp: Respirations not labored RA. clear to auscultation bilaterally and only dullness at left base. Cardio: regular rate and rhythm. No gallop. GI: soft, nontender, not distended, no mass or organomegaly. Normally active bowel sounds. Surgical incision not remarkable. Musculoskeletal/ Extremities: Good ROM at left shoulder, with tenderness  at insertion of deltoid. No swelling LUE. LE without pitting edema, cords, tenderness Neuro: no change peripheral neuropathy. Otherwise nonfocal. PSYCH appropriate mood and affect Skin without rash or ecchymosis including left upper arm, no petechiae Portacath-without erythema or tenderness  Lab Results:  Results for orders placed or performed in visit on 07/01/16  CBC with Differential  Result Value Ref Range   WBC 4.8 3.9 - 10.3 10e3/uL   NEUT# 3.1 1.5 - 6.5 10e3/uL   HGB 11.1 (L) 11.6 - 15.9 g/dL   HCT 33.6 (L) 34.8 - 46.6 %   Platelets 317 145 - 400 10e3/uL   MCV 107.5 (H) 79.5 - 101.0 fL   MCH 35.6 (H) 25.1 - 34.0 pg   MCHC 33.1 31.5 - 36.0 g/dL   RBC 3.12 (L) 3.70 - 5.45 10e6/uL   RDW 14.7 (H) 11.2 - 14.5 %   lymph# 1.3 0.9 - 3.3 10e3/uL   MONO# 0.3 0.1 - 0.9 10e3/uL   Eosinophils Absolute 0.1 0.0 - 0.5 10e3/uL   Basophils Absolute 0.1 0.0 - 0.1 10e3/uL   NEUT% 64.3 38.4 - 76.8 %   LYMPH% 26.7 14.0 - 49.7 %   MONO% 6.0 0.0 - 14.0 %   EOS% 1.8 0.0 - 7.0 %   BASO% 1.2 0.0 - 2.0 %  Comprehensive metabolic panel  Result Value Ref Range   Sodium 142 136 - 145 mEq/L   Potassium 4.0 3.5 - 5.1 mEq/L   Chloride 105 98 - 109 mEq/L   CO2 27 22 - 29 mEq/L   Glucose 145 (H) 70 - 140 mg/dl  BUN 10.1 7.0 - 26.0 mg/dL   Creatinine 0.8 0.6 - 1.1 mg/dL   Total Bilirubin 0.22 0.20 - 1.20 mg/dL   Alkaline Phosphatase 113 40 - 150 U/L   AST 13 5 - 34 U/L   ALT 11 0 - 55 U/L   Total Protein 9.0 (H) 6.4 - 8.3 g/dL   Albumin 3.1 (L) 3.5 - 5.0 g/dL   Calcium 10.3 8.4 - 10.4 mg/dL   Anion Gap 10 3 - 11 mEq/L   EGFR >90 >90 ml/min/1.73 m2     Studies/Results: CT CHEST, ABDOMEN, AND PELVIS WITH CONTRAST   06-21-16  COMPARISON:  03/19/2016  FINDINGS: CT CHEST FINDINGS  Cardiovascular: The heart size is normal. No pericardial effusion. Ascending thoracic aorta measures 4.0 cm diameter. Coronary artery calcification is noted.  Mediastinum/Nodes: 18 mm short axis subcarinal  lymph node is new in the interval. 16 mm short axis left infrahilar lymph node was 10 mm short axis previously. 14 mm lymph node measured in the right infrahilar region previously now measures 17 mm. Soft tissue lesion in the prevascular space, along the medial pleura of the left upper lobe was measured previously at 1.6 x 2.6 cm and now measures 1.9 x 3.3 cm. Right Port-A-Cath tip is distal SVC level near the junction with the RA.  Lungs/Pleura: Nodular pleural disease in left apex has progressed index posterior left upper lobe pleural nodule was 11 mm today compared to 4 mm previously. Less pronounced pleural disease is seen on the right. 18 mm right posterior lower lobe parenchymal nodule was 19 mm previously. 60 mm posterior left lower lobe pulmonary nodule measured previously is now 2.5 cm. 6 mm nodule just anterior to the left major fissure (image 114) was 10 mm previously. 12 mm lingular nodule seen on image 124 was 16 mm previously. 15 mm nodule along the cranial aspect the left major fissure was 9 mm previously.  Posterior left lower lobe collapse/ consolidation and small left pleural effusion are again noted.  Musculoskeletal: Bone windows reveal no worrisome lytic or sclerotic osseous lesions.  CT ABDOMEN PELVIS FINDINGS  Hepatobiliary: No focal abnormality within the liver parenchyma. Geographic areas of decreased attenuation in the liver parenchyma are likely related to fatty deposition. There is no evidence for gallstones, gallbladder wall thickening, or pericholecystic fluid. No intrahepatic or extrahepatic biliary dilation.  Pancreas: No focal mass lesion. No dilatation of the main duct. No intraparenchymal cyst. No peripancreatic edema.  Spleen: No splenomegaly. No focal mass lesion.  Adrenals/Urinary Tract: No adrenal nodule or mass. Multiple bilateral renal cysts are noted. No enhancing lesion in either kidney. No hydroureteronephrosis. The urinary  bladder appears normal for the degree of distention.  Stomach/Bowel: Stomach is nondistended. No gastric wall thickening. No evidence of outlet obstruction. Duodenum is normally positioned as is the ligament of Treitz. No small bowel wall thickening. No small bowel dilatation. The terminal ileum is normal. The appendix is not visualized, but there is no edema or inflammation in the region of the cecum. No gross colonic mass. No colonic wall thickening. No substantial diverticular change.  Vascular/Lymphatic: There is abdominal aortic atherosclerosis without aneurysm. No gastrohepatic or hepatoduodenal ligament lymphadenopathy. Stable appearance of a near water density retroperitoneal structure coursing behind the aorta and IVC, likely lymphatic in origin or related to lymphangioma. No retroperitoneal lymphadenopathy. Index left para-aortic lymph node measured previously is 7 mm is now 4 mm in short axis on image 81 series 2. Left pelvic sidewall lymph node measured 2  mm cm short axis on the prior study is now 1.2 cm.  Reproductive: Uterus surgically absent.  There is no adnexal mass.  Other: No intraperitoneal free fluid.  Musculoskeletal: Bone windows reveal no worrisome lytic or sclerotic osseous lesions.  IMPRESSION: 1. Apparent interval mixed response to therapy. Pleural disease in the chest has progressed along with progression of mediastinal lymphadenopathy. Some pulmonary nodules progressed while others have decreased in size in the interval. The index abdominal retroperitoneal left pelvic sidewall lymph nodes have decreased in the interval. 2. Persistent small left pleural effusion with posterior left base collapse/consolidation. 3.  Abdominal Aortic Atherosclerois   PACs images reviewed with patient.   Medications: I have reviewed the patient's current medications.  DISCUSSION Findings on CT reviewed verbally and with scan images. She is not obviously  symptomatic, but has reached maximum benefit from [place] taxol based on some progression on scans, carbo skin test reaction and peripheral neuropathy. She understands that treatment is in attempt to control disease, not in curative attempt, but certainly has had improvement in disease and in quality of life with treatment thus far.  I have explained that I will ask [facility] pathology to do MSI testing on pleural fluid cytology if possible, in case pembrolizumab is a possibility. In meantime, we have discussed doxil,  with or without avastin. I have explained that doxil generally does not cause significant cytopenias, which have been a problem with her other chemo, and generally not severe nausea or hair loss. We have discussed that doxil can cause cumulative decrease in cardiac function, last echocardiogram 12-01-15 with EF 55-60% and trivial pericardial effusion; will repeat echo prior to start of doxil. Skin care emphasized, including liberal use of lotion, avoiding friction from clothing, oral ice and ice packs palms/ soles during chemo. She has PAC. She had full doxil teaching by RN after MD discussion. Could add avastin later if needed. She prefers treatments on Thursdays.  Verbal consent given for doxil.   She may have tendonitis causing soreness at area of deltoid insertion left UE. She would like to see orthopedist, referral placed.   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 Not obviously symptomatic. WIll begin doxil ~ 07-11-16 as long as echocardiogram ok prior. Hopefully she will not have significant cytopenias with the doxil. Skin care.  She will see Dr Alvy Bimler in Feb and Dr Jacquelyne Balint per that office.   Carbo skin test reaction 05-16-16. Will request MSI testing on previous cytology specimen if possible, as this may be useful  with later treatment considerations.  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 No recurrent effusion by CT 06-21-16 3.DNR patient's request. Advance Directives completed.  4.iron deficiency anemia: she had feraheme on 12-29-15 (not clear if a second dose in hospital). Transfused 2 units PRBCs early Aug.  Iron studies 04-18-16 still low, however hgb better off carboplatin and she continues oral iron, follow. 5.elevated total protein and low albumin: SPEP previously no M spike. 24 hr urine 09-12-15 no M spike. 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 Continue to push fluids 8.PAC in 9. Long past tobacco: DCd 09-2015. Emphysematous changes on CT. Known to [name] [facility] pulmonary) 10. Microscopic hematuria: known to Dr Louis Meckel. Urine cytology negative  11.unable to visualize beyond sigmoid on colonoscopy 12-16 due to stricture  12.history substance abuse including ETOH and marijuana, none now 13. long history anxiety and depression. [place] chaplain knows her. 65.. Restless legs with benadryl  16.flu vaccine 04-05-16 17.possible tendonitis at left deltoid. Referral placed for orthopedics  All questions answered and she knows to call if needed prior to next scheduled visit. Doxil orders placed using [employer], managed care notified for preauthorization. Time spent 40 min including >50% counseling and coordination of care. Cc Dr Jacquelyne Balint, S.Hedgecock   Evlyn Clines, MD   07/01/2016, 8:41 PM

## 2016-07-02 ENCOUNTER — Telehealth: Payer: Self-pay

## 2016-07-02 NOTE — Telephone Encounter (Signed)
Told Sheena Caldwell that she is set up to see Dr. Veverly Fells at [facility] on 07-11-15 at [phone].  She is to arrive at 0930 to fill out paper work .  The office is located at [place].  [address].  Office number is [phone]. Sheena Caldwell verbalized understanding.

## 2016-07-02 NOTE — Telephone Encounter (Signed)
-----   Message from Gordy Levan, MD sent at 07/01/2016  3:51 PM EST ----- Regarding: orthopedics referral, echo Please call [facility],  to be seen for possible left deltoid tendonitis   New patient consultation  Please watch out for echocardiogram at [facility], need results prior to first doxil on 1-25  thanks

## 2016-07-07 ENCOUNTER — Encounter: Payer: Self-pay | Admitting: Oncology

## 2016-07-07 DIAGNOSIS — C7800 Secondary malignant neoplasm of unspecified lung: Secondary | ICD-10-CM | POA: Insufficient documentation

## 2016-07-07 NOTE — Progress Notes (Signed)
[place] END OF TREATMENT   Name: Sheena Caldwell Date: July 07, 2016  MRN: [id number] DOB: 27-May-1951   TREATMENT DATES: 01-04-16 thru 05-16-16   REFERRING PHYSICIAN: Jacquelyne Balint  DIAGNOSIS: serous endometrial carcinoma  STAGE AT START OF TREATMENT: IVB   INTENT: control   DRUGS OR REGIMENS GIVEN:  Carbo taxol   MAJOR TOXICITIES: cytopenias, peripheral neuropathy, carbo skin test reaction    REASON TREATMENT STOPPED: progression   PERFORMANCE STATUS AT END: 1  ONGOING PROBLEMS: peripheral neuropathy, fatigue   FOLLOW UP PLANS: restaging scans, change treatment

## 2016-07-07 NOTE — Telephone Encounter (Signed)
Dr. Mariana Kaufman office note from 07-01-16 faxed to Dr. Veverly Fells for New patient  appointment on 07-10-16

## 2016-07-07 NOTE — Progress Notes (Signed)
START ON PATHWAY REGIMEN - Uterine  UTOS200: Doxorubicin 50 mg/m2 IVP q21 Days   A cycle is every 21 days:     Doxorubicin (Adriamycin(R)) 50 mg/m2 IV Push Dose Mod: None Additional Orders: Recommended cumulative dose of doxorubicin = 450 mg/m2.  **Always confirm dose/schedule in your pharmacy ordering system**    Patient Characteristics: Papillary Serous and Clear Cell Histology, Second Line, Relapse < 6 Months From Prior Therapy AJCC T Category: TX AJCC N Category: NX AJCC M Category: M1 AJCC 8 Stage Grouping: IVB Would you be surprised if this patient died  in the next year? I would be surprised if this patient died in the next year Line of therapy: Second Line Time to Recurrence: Relapse < 6 Months From Prior Therapy  Intent of Therapy: Non-Curative / Palliative Intent, Discussed with Patient

## 2016-07-08 ENCOUNTER — Encounter: Payer: Self-pay | Admitting: Oncology

## 2016-07-08 ENCOUNTER — Telehealth: Payer: Self-pay | Admitting: *Deleted

## 2016-07-08 ENCOUNTER — Other Ambulatory Visit: Payer: Self-pay | Admitting: Oncology

## 2016-07-08 ENCOUNTER — Other Ambulatory Visit: Payer: Self-pay | Admitting: Hematology and Oncology

## 2016-07-08 NOTE — Telephone Encounter (Signed)
Per 1/22 MD staff message I have moved appt to [phone]

## 2016-07-08 NOTE — Progress Notes (Signed)
Medical Oncology  Requested MSI testing on [facility] cytology [id number] from 12-22-15  Path will let us know if not sufficient material from cytology for this.  Godfrey Pick, MD

## 2016-07-09 ENCOUNTER — Other Ambulatory Visit (HOSPITAL_COMMUNITY)
Admission: RE | Admit: 2016-07-09 | Discharge: 2016-07-09 | Disposition: A | Discharge status: Home / Self Care | Payer: Medicare Other | Source: Ambulatory Visit | Attending: Oncology | Admitting: Oncology

## 2016-07-09 DIAGNOSIS — C7989 Secondary malignant neoplasm of other specified sites: Secondary | ICD-10-CM | POA: Diagnosis present

## 2016-07-10 ENCOUNTER — Other Ambulatory Visit: Payer: Self-pay | Admitting: Oncology

## 2016-07-10 ENCOUNTER — Ambulatory Visit (HOSPITAL_COMMUNITY)
Admission: RE | Admit: 2016-07-10 | Discharge: 2016-07-10 | Disposition: A | Discharge status: Home / Self Care | Payer: Medicare Other | Source: Ambulatory Visit | Attending: Oncology | Admitting: Oncology

## 2016-07-10 DIAGNOSIS — Z79899 Other long term (current) drug therapy: Secondary | ICD-10-CM

## 2016-07-10 DIAGNOSIS — I4891 Unspecified atrial fibrillation: Secondary | ICD-10-CM

## 2016-07-10 DIAGNOSIS — Z9221 Personal history of antineoplastic chemotherapy: Secondary | ICD-10-CM | POA: Insufficient documentation

## 2016-07-10 NOTE — Progress Notes (Signed)
  Echocardiogram 2D Echocardiogram has been performed.  Sheena Caldwell 07/10/2016, 11:18 AM

## 2016-07-11 ENCOUNTER — Ambulatory Visit (HOSPITAL_BASED_OUTPATIENT_CLINIC_OR_DEPARTMENT_OTHER): Payer: Medicare Other

## 2016-07-11 ENCOUNTER — Other Ambulatory Visit (HOSPITAL_BASED_OUTPATIENT_CLINIC_OR_DEPARTMENT_OTHER): Payer: Medicare Other

## 2016-07-11 ENCOUNTER — Telehealth: Payer: Self-pay | Admitting: Oncology

## 2016-07-11 ENCOUNTER — Other Ambulatory Visit: Payer: Self-pay | Admitting: Oncology

## 2016-07-11 ENCOUNTER — Ambulatory Visit: Payer: Medicare Other

## 2016-07-11 VITALS — BP 105/68 | HR 84 | Temp 98.0°F | Resp 18

## 2016-07-11 DIAGNOSIS — C541 Malignant neoplasm of endometrium: Secondary | ICD-10-CM | POA: Diagnosis present

## 2016-07-11 DIAGNOSIS — C7802 Secondary malignant neoplasm of left lung: Secondary | ICD-10-CM

## 2016-07-11 DIAGNOSIS — C7801 Secondary malignant neoplasm of right lung: Secondary | ICD-10-CM | POA: Diagnosis not present

## 2016-07-11 DIAGNOSIS — Z5111 Encounter for antineoplastic chemotherapy: Secondary | ICD-10-CM | POA: Diagnosis present

## 2016-07-11 DIAGNOSIS — C782 Secondary malignant neoplasm of pleura: Secondary | ICD-10-CM | POA: Diagnosis not present

## 2016-07-11 DIAGNOSIS — Z95828 Presence of other vascular implants and grafts: Secondary | ICD-10-CM

## 2016-07-11 LAB — CBC WITH DIFFERENTIAL/PLATELET
BASO%: 1 % (ref 0.0–2.0)
Basophils Absolute: 0 10*3/uL (ref 0.0–0.1)
EOS%: 2.3 % (ref 0.0–7.0)
Eosinophils Absolute: 0.1 10*3/uL (ref 0.0–0.5)
HEMATOCRIT: 32.4 % — AB (ref 34.8–46.6)
HEMOGLOBIN: 10.9 g/dL — AB (ref 11.6–15.9)
LYMPH%: 28.8 % (ref 14.0–49.7)
MCH: 35.4 pg — ABNORMAL HIGH (ref 25.1–34.0)
MCHC: 33.7 g/dL (ref 31.5–36.0)
MCV: 105.3 fL — AB (ref 79.5–101.0)
MONO#: 0.3 10*3/uL (ref 0.1–0.9)
MONO%: 8 % (ref 0.0–14.0)
NEUT#: 2.6 10*3/uL (ref 1.5–6.5)
NEUT%: 59.9 % (ref 38.4–76.8)
PLATELETS: 300 10*3/uL (ref 145–400)
RBC: 3.08 10*6/uL — ABNORMAL LOW (ref 3.70–5.45)
RDW: 14.9 % — AB (ref 11.2–14.5)
WBC: 4.3 10*3/uL (ref 3.9–10.3)
lymph#: 1.2 10*3/uL (ref 0.9–3.3)

## 2016-07-11 LAB — COMPREHENSIVE METABOLIC PANEL
ALBUMIN: 3.2 g/dL — AB (ref 3.5–5.0)
ALK PHOS: 108 U/L (ref 40–150)
ALT: 9 U/L (ref 0–55)
ANION GAP: 8 meq/L (ref 3–11)
AST: 13 U/L (ref 5–34)
BILIRUBIN TOTAL: 0.22 mg/dL (ref 0.20–1.20)
BUN: 14.8 mg/dL (ref 7.0–26.0)
CALCIUM: 10 mg/dL (ref 8.4–10.4)
CO2: 28 mEq/L (ref 22–29)
Chloride: 103 mEq/L (ref 98–109)
Creatinine: 0.7 mg/dL (ref 0.6–1.1)
GLUCOSE: 81 mg/dL (ref 70–140)
Potassium: 4.2 mEq/L (ref 3.5–5.1)
Sodium: 139 mEq/L (ref 136–145)
TOTAL PROTEIN: 9.1 g/dL — AB (ref 6.4–8.3)

## 2016-07-11 MED ORDER — DEXAMETHASONE SODIUM PHOSPHATE 10 MG/ML IJ SOLN
INTRAMUSCULAR | Status: AC
  Filled 2016-07-11: qty 1

## 2016-07-11 MED ORDER — HEPARIN SOD (PORK) LOCK FLUSH 100 UNIT/ML IV SOLN
500.0000 [IU] | Freq: Once | INTRAVENOUS | Status: AC | PRN
  Administered 2016-07-11: 500 [IU]
  Filled 2016-07-11: qty 5

## 2016-07-11 MED ORDER — ONDANSETRON HCL 4 MG/2ML IJ SOLN
8.0000 mg | Freq: Once | INTRAMUSCULAR | Status: AC
  Administered 2016-07-11: 8 mg via INTRAVENOUS

## 2016-07-11 MED ORDER — DEXAMETHASONE SODIUM PHOSPHATE 10 MG/ML IJ SOLN
10.0000 mg | Freq: Once | INTRAMUSCULAR | Status: AC
  Administered 2016-07-11: 10 mg via INTRAVENOUS

## 2016-07-11 MED ORDER — SODIUM CHLORIDE 0.9% FLUSH
10.0000 mL | INTRAVENOUS | Status: DC | PRN
  Administered 2016-07-11: 10 mL
  Filled 2016-07-11: qty 10

## 2016-07-11 MED ORDER — SODIUM CHLORIDE 0.9 % IJ SOLN
10.0000 mL | INTRAMUSCULAR | Status: DC | PRN
  Administered 2016-07-11: 10 mL via INTRAVENOUS
  Filled 2016-07-11: qty 10

## 2016-07-11 MED ORDER — ONDANSETRON HCL 4 MG/2ML IJ SOLN
INTRAMUSCULAR | Status: AC
  Filled 2016-07-11: qty 2

## 2016-07-11 MED ORDER — DEXTROSE 5 % IV SOLN
Freq: Once | INTRAVENOUS | Status: AC
  Administered 2016-07-11: 12:00:00 via INTRAVENOUS

## 2016-07-11 MED ORDER — DOXORUBICIN HCL LIPOSOMAL CHEMO INJECTION 2 MG/ML
40.0000 mg/m2 | Freq: Once | INTRAVENOUS | Status: AC
  Administered 2016-07-11: 70 mg via INTRAVENOUS
  Filled 2016-07-11: qty 10

## 2016-07-11 NOTE — Telephone Encounter (Signed)
Medical Oncology  [facility] pathology working on request to add MSI testing to previous tissue. Due to minimal tissue in block, they need purple top tube of blood with next draw at [facility], sent to attn Maudie Mercury / Suanne Marker at [facility] pathology for MSI testing. [facility] technologist aware and will assist when patient is at [facility] for labs 07-15-16.  Godfrey Pick, MD

## 2016-07-11 NOTE — Patient Instructions (Addendum)
[facility] Discharge Instructions for Patients Receiving Chemotherapy  Today you received the following chemotherapy agents doxil  To help prevent nausea and vomiting after your treatment, we encourage you to take your nausea medication as directed  If you develop nausea and vomiting that is not controlled by your nausea medication, call the clinic.   BELOW ARE SYMPTOMS THAT SHOULD BE REPORTED IMMEDIATELY:  *FEVER GREATER THAN 100.5 F  *CHILLS WITH OR WITHOUT FEVER  NAUSEA AND VOMITING THAT IS NOT CONTROLLED WITH YOUR NAUSEA MEDICATION  *UNUSUAL SHORTNESS OF BREATH  *UNUSUAL BRUISING OR BLEEDING  TENDERNESS IN MOUTH AND THROAT WITH OR WITHOUT PRESENCE OF ULCERS  *URINARY PROBLEMS  *BOWEL PROBLEMS  UNUSUAL RASH Items with * indicate a potential emergency and should be followed up as soon as possible.  Feel free to call the clinic you have any questions or concerns. The clinic phone number is [phone].  Doxorubicin Liposomal injection What is this medicine? LIPOSOMAL DOXORUBICIN (LIP oh som al dox oh ROO bi sin) is a chemotherapy drug. This medicine is used to treat many kinds of cancer like Kaposi's sarcoma, multiple myeloma, and ovarian cancer. This medicine may be used for other purposes; ask your health care provider or pharmacist if you have questions. COMMON BRAND NAME(S): Doxil, Lipodox What should I tell my health care provider before I take this medicine? They need to know if you have any of these conditions: -blood disorders -heart disease -infection (especially a virus infection such as chickenpox, cold sores, or herpes) -liver disease -recent or ongoing radiation therapy -an unusual or allergic reaction to doxorubicin, other chemotherapy agents, soybeans, other medicines, foods, dyes, or preservatives -pregnant or trying to get pregnant -breast-feeding How should I use this medicine? This drug is given as an infusion into a vein. It is  administered in a hospital or clinic by a specially trained health care professional. If you have pain, swelling, burning or any unusual feeling around the site of your injection, tell your health care professional right away. Talk to your pediatrician regarding the use of this medicine in children. Special care may be needed. Overdosage: If you think you have taken too much of this medicine contact a poison control center or emergency room at once. NOTE: This medicine is only for you. Do not share this medicine with others. What if I miss a dose? It is important not to miss your dose. Call your doctor or health care professional if you are unable to keep an appointment. What may interact with this medicine? Do not take this medicine with any of the following medications: -zidovudine This medicine may also interact with the following medications: -medicines to increase blood counts like filgrastim, pegfilgrastim, sargramostim -vaccines Talk to your doctor or health care professional before taking any of these medicines: -acetaminophen -aspirin -ibuprofen -ketoprofen -naproxen This list may not describe all possible interactions. Give your health care provider a list of all the medicines, herbs, non-prescription drugs, or dietary supplements you use. Also tell them if you smoke, drink alcohol, or use illegal drugs. Some items may interact with your medicine. What should I watch for while using this medicine? Your condition will be monitored carefully while you are receiving this medicine. You will need important blood work done while you are taking this medicine. This drug may make you feel generally unwell. This is not uncommon, as chemotherapy can affect healthy cells as well as cancer cells. Report any side effects. Continue your course of treatment even  though you feel ill unless your doctor tells you to stop. Your urine may turn orange-red for a few days after your dose. This is not blood.  If your urine is dark or brown, call your doctor. In some cases, you may be given additional medicines to help with side effects. Follow all directions for their use. Call your doctor or health care professional for advice if you get a fever (100.5 degrees F or higher), chills or sore throat, or other symptoms of a cold or flu. Do not treat yourself. This drug decreases your body's ability to fight infections. Try to avoid being around people who are sick. This medicine may increase your risk to bruise or bleed. Call your doctor or health care professional if you notice any unusual bleeding. Be careful brushing and flossing your teeth or using a toothpick because you may get an infection or bleed more easily. If you have any dental work done, tell your dentist you are receiving this medicine. Avoid taking products that contain aspirin, acetaminophen, ibuprofen, naproxen, or ketoprofen unless instructed by your doctor. These medicines may hide a fever. Men and women of childbearing age should use effective birth control methods while using taking this medicine. Do not become pregnant while taking this medicine. There is a potential for serious side effects to an unborn child. Talk to your health care professional or pharmacist for more information. Do not breast-feed an infant while taking this medicine. Talk to your doctor about your risk of cancer. You may be more at risk for certain types of cancers if you take this medicine. What side effects may I notice from receiving this medicine? Side effects that you should report to your doctor or health care professional as soon as possible: -allergic reactions like skin rash, itching or hives, swelling of the face, lips, or tongue -low blood counts - this medicine may decrease the number of white blood cells, red blood cells and platelets. You may be at increased risk for infections and bleeding. -signs of hand-foot syndrome - tingling or burning, redness,  flaking, swelling, small blisters, or small sores on the palms of your hands or the soles of your feet -signs of infection - fever or chills, cough, sore throat, pain or difficulty passing urine -signs of decreased platelets or bleeding - bruising, pinpoint red spots on the skin, black, tarry stools, blood in the urine -signs of decreased red blood cells - unusually weak or tired, fainting spells, lightheadedness -back pain, chills, facial flushing, fever, headache, tightness in the chest or throat during the infusion -breathing problems -chest pain -fast, irregular heartbeat -mouth pain, redness, sores -pain, swelling, redness at site where injected -pain, tingling, numbness in the hands or feet -swelling of ankles, feet, or hands -vomiting Side effects that usually do not require medical attention (report to your doctor or health care professional if they continue or are bothersome): -diarrhea -hair loss -loss of appetite -nail discoloration or damage -nausea -red or watery eyes -red colored urine -stomach upset This list may not describe all possible side effects. Call your doctor for medical advice about side effects. You may report side effects to FDA at 1-800-FDA-1088. Where should I keep my medicine? This drug is given in a hospital or clinic and will not be stored at home. NOTE: This sheet is a summary. It may not cover all possible information. If you have questions about this medicine, talk to your doctor, pharmacist, or health care provider.  2017 Elsevier/Gold Standard (2012-02-21 10:12:56)

## 2016-07-12 ENCOUNTER — Telehealth: Payer: Self-pay

## 2016-07-12 NOTE — Telephone Encounter (Signed)
-----   Message from Arty Baumgartner, RN sent at 07/11/2016 12:40 PM EST ----- Regarding: First time chemo, livesay  First time doxil.  Livesay.

## 2016-07-12 NOTE — Telephone Encounter (Signed)
S/w after new doxil yesterday. She is no nauseated, did eat bbq and slaw and hush puppies. Discussed less greasy food choices, and/or small portions. Pt did say she did not eat it all. She has drank 4 8 oz glasses of water so far today, goal is 6 or more glasses. Reinforced skin care teaching.

## 2016-07-14 ENCOUNTER — Other Ambulatory Visit: Payer: Self-pay | Admitting: Oncology

## 2016-07-15 ENCOUNTER — Ambulatory Visit (HOSPITAL_BASED_OUTPATIENT_CLINIC_OR_DEPARTMENT_OTHER): Payer: Medicare Other

## 2016-07-15 ENCOUNTER — Ambulatory Visit (HOSPITAL_BASED_OUTPATIENT_CLINIC_OR_DEPARTMENT_OTHER): Payer: Medicare Other | Admitting: Oncology

## 2016-07-15 ENCOUNTER — Other Ambulatory Visit (HOSPITAL_BASED_OUTPATIENT_CLINIC_OR_DEPARTMENT_OTHER): Payer: Medicare Other

## 2016-07-15 VITALS — BP 105/75 | HR 97 | Temp 98.1°F | Resp 18 | Ht 68.0 in | Wt 140.0 lb

## 2016-07-15 DIAGNOSIS — G620 Drug-induced polyneuropathy: Secondary | ICD-10-CM | POA: Diagnosis not present

## 2016-07-15 DIAGNOSIS — D509 Iron deficiency anemia, unspecified: Secondary | ICD-10-CM

## 2016-07-15 DIAGNOSIS — Z95828 Presence of other vascular implants and grafts: Secondary | ICD-10-CM

## 2016-07-15 DIAGNOSIS — Z5111 Encounter for antineoplastic chemotherapy: Secondary | ICD-10-CM

## 2016-07-15 DIAGNOSIS — C541 Malignant neoplasm of endometrium: Secondary | ICD-10-CM

## 2016-07-15 DIAGNOSIS — C7802 Secondary malignant neoplasm of left lung: Secondary | ICD-10-CM | POA: Diagnosis not present

## 2016-07-15 DIAGNOSIS — T451X5A Adverse effect of antineoplastic and immunosuppressive drugs, initial encounter: Secondary | ICD-10-CM

## 2016-07-15 DIAGNOSIS — C7801 Secondary malignant neoplasm of right lung: Secondary | ICD-10-CM

## 2016-07-15 DIAGNOSIS — D500 Iron deficiency anemia secondary to blood loss (chronic): Secondary | ICD-10-CM

## 2016-07-15 LAB — COMPREHENSIVE METABOLIC PANEL
ALT: 13 U/L (ref 0–55)
AST: 14 U/L (ref 5–34)
Albumin: 3 g/dL — ABNORMAL LOW (ref 3.5–5.0)
Alkaline Phosphatase: 111 U/L (ref 40–150)
Anion Gap: 10 mEq/L (ref 3–11)
BUN: 11 mg/dL (ref 7.0–26.0)
CALCIUM: 10.1 mg/dL (ref 8.4–10.4)
CO2: 26 meq/L (ref 22–29)
Chloride: 102 mEq/L (ref 98–109)
Creatinine: 0.8 mg/dL (ref 0.6–1.1)
EGFR: >90 mL/min/{1.73_m2} (ref >90)
Glucose: 124 mg/dl (ref 70–140)
POTASSIUM: 4 meq/L (ref 3.5–5.1)
Sodium: 138 mEq/L (ref 136–145)
Total Bilirubin: 0.29 mg/dL (ref 0.20–1.20)
Total Protein: 9.2 g/dL — ABNORMAL HIGH (ref 6.4–8.3)

## 2016-07-15 LAB — CBC WITH DIFFERENTIAL/PLATELET
BASO%: 0.4 % (ref 0.0–2.0)
BASOS ABS: 0 10*3/uL (ref 0.0–0.1)
EOS%: 1.5 % (ref 0.0–7.0)
Eosinophils Absolute: 0.1 10*3/uL (ref 0.0–0.5)
HEMATOCRIT: 33.9 % — AB (ref 34.8–46.6)
HGB: 11 g/dL — ABNORMAL LOW (ref 11.6–15.9)
LYMPH%: 29.8 % (ref 14.0–49.7)
MCH: 33.8 pg (ref 25.1–34.0)
MCHC: 32.4 g/dL (ref 31.5–36.0)
MCV: 104.3 fL — ABNORMAL HIGH (ref 79.5–101.0)
MONO#: 0.3 10*3/uL (ref 0.1–0.9)
MONO%: 7.2 % (ref 0.0–14.0)
NEUT#: 2.8 10*3/uL (ref 1.5–6.5)
NEUT%: 61.1 % (ref 38.4–76.8)
Platelets: 297 10*3/uL (ref 145–400)
RBC: 3.25 10*6/uL — AB (ref 3.70–5.45)
RDW: 14.1 % (ref 11.2–14.5)
WBC: 4.6 10*3/uL (ref 3.9–10.3)
lymph#: 1.4 10*3/uL (ref 0.9–3.3)

## 2016-07-15 MED ORDER — SODIUM CHLORIDE 0.9 % IJ SOLN
10.0000 mL | INTRAMUSCULAR | Status: DC | PRN
  Administered 2016-07-15: 10 mL via INTRAVENOUS
  Filled 2016-07-15: qty 10

## 2016-07-15 MED ORDER — HEPARIN SOD (PORK) LOCK FLUSH 100 UNIT/ML IV SOLN
500.0000 [IU] | Freq: Once | INTRAVENOUS | Status: AC | PRN
  Administered 2016-07-15: 500 [IU] via INTRAVENOUS
  Filled 2016-07-15: qty 5

## 2016-07-15 NOTE — Progress Notes (Signed)
OFFICE PROGRESS NOTE   July 15, 2016   Physicians: Jacquelyne Balint (gyn onc), Everitt Amber, Nunzio Cobbs Hedgecock(PCP, [facility] [employer]), _ [name] [facility]); Louis Meckel ([facility]), [name] , Owens Loffler ([facility]); Praveen Mannam ([facility]), P.Van Trigt  INTERVAL HISTORY:  Patient is seen, alone for visit, in follow up of first doxil given 07-11-16 for metastatic endometrial cancer involving lungs. She continues to do very well despite extent of disease.  She has not had gCSF with doxil. Echocardiogram 07-10-16 had EF 60-65% Most recent imaging was CT CAP 06-21-16, with some mixed response in lungs to previous carbo taxol. She is to see Dr Jacquelyne Balint on 07-24-16.  Patient had no difficulty with first doxil infusion, did use oral ice and ice packs to hands and feet during treatment. Other than some more fatigue, she has felt well since that chemo, including no nausea or vomiting, no skin irritation and only minimal stomatitis which rapidly resolved. She has used lotion liberally and avoided friction from clothing. She denies increased SOB or cough, no chest pain. Appetite is fair. Bowels ok. No fever or symptoms of infection. No LE swelling. No new or different pain. No problems with PAC. Remainder of 14 point Review of Systems negative.   No information on MSH/ MSI testing on surgical path in [place] in [place] system. MSI testing in progress now by [facility] pathology on cytology specimen [id number].  Flu vaccine 04-05-16 PAC flushed 07-01-16 Feraheme on 12-29-15  ONCOLOGIC HISTORY Patient had post menopausal vaginal bleeding early 2015, then persistent vaginal discharge, for which she was seen by PCP  ~ 02-2014, had CTs in [facility] and was referred to Dr Adella Nissen, gyn in [facility] with endometrial biopsy. Surgery by Dr Jacquelyne Balint at [facility] in [place] on 04-06-14 TAH, BSO, omentectomy, pelvic and right common iliac  node evaluation. 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 Bulk of the carcinoma was in lower uterine segment where it was within 0.1 cm of serosal surface. Cytology positive for adenocarcinoma on washings ([id number]). Surgical findings were significant for no paraaortic adenopathy apparent. Patient received 3 cycles of adjuvant chemotherapy in [place] by Dr Sabra Heck from 05-05-14 thru 07-29-14, with neulasta. Per Dr Sabra Heck 07-29-14  "NED during chemotherapy and adequate PS". Patient was see by Dr Denman George 08-09-14, who recommends restaging scans after 6 cycles of chemotherapy and consideration of vaginal brachytherapy. CT AP done in [facility] system 08-15-14 (due to elevated LFTs and blood in urine just after transfer of care) with soft tissue fullness at superior abdominal aorta and question of retroperitoneal necrotic adenopathy; plan repeat scans after complete present chemotherapy. Cycles 4 -6 carbo taxol given 08-24-14 thru 10-21-14, with gCSF support (last cycle carboplatin only due to peripheral neuropathy in feet). CT AP 12-25-14 without adenopathy or other apparent residual or recurrent malignancy.  Radiation treatment dates: July 19, July 27, July 29, August 9, August 11 Site/dose: Proximal vagina, 30 gray in 5 fractions Patient was hospitalized at [facility] 11-2015 with progressive SOB, with large left pleural effusion. CT chest 11-30-15 showed new enlarged and necrotic right hilar and mediastinal nodes, multiple right pulmonary masses, large left pleural effusion with pleural mass, tiny  lucent foci too small to characterize in thoracic vertebrae.  Cytology of pleural fluid [id number] adenocarcinoma (appeared  serous per pathologist verbal report). CT AP 12-01-15 showed no new or suspicious liver findings, 1.2 cm left periaortic node, large nodal mass left pelvic sidewall 6.2 x 2.9 cm, no ascites or peritoneal changes, no apparent bony findings. She had day 1 cycle 1 dose dense carbo taxol on 01-04-16, with delay for counts and symptoms prior to changing regimen to day 1 day 8 every 21 days, with granix support. CT CAP 03-19-16 showed improvement after 3 cycles. She continued with reduced "dose dense" carbo taxol thru 05-16-16, with reaction to [place] skin then. CT CAP 06-21-16 mixed response in chest, with increased central nodes and pleural disease, mixed response in pulmonary nodules, no bone lesions, no increased retroperitoneal nodes, no hydronephrosis and bladder ok. Echocardiogram 07-10-16 had EF 60-65%. First doxil given 07-11-16.     Anemia: Iron studies 08-2014 had serum iron 43, %sat 22, ferritin 429, B12 >2000 SIEP 10-2014 had no monoclonal protein (elevated IgG and IgA polyclonal). Urinalyses had microscopic hematuria, with cystoscopy by Dr Burman Nieves at [facility] 10-20-2014 unremarkable. Haptoglobin 08-2014 not low (449) Colonoscopy by Dr Oretha Caprice [facility] 05-23-15 unable to advance pediatric scope beyond sigmoid colon, which was fixed  Objective:  Vital signs in last 24 hours:  BP 105/75 (BP Location: Left Arm, Patient Position: Sitting)   Pulse 97   Temp 98.1 F (36.7 C) (Oral)   Resp 18   Ht 5' 8"  (1.727 m)   Wt 140 lb (63.5 kg)   SpO2 100%   BMI 21.29 kg/m  Weight down 1 lb. Alert, oriented and appropriate, looks comfortable, soft spoken and very pleasant as always.. Ambulatory without assistance.    HEENT:PERRL, sclerae not icteric. Oral mucosa moist without lesions, posterior pharynx clear. No mucositis now. Neck supple. No JVD.  Lymphatics:no cervical,supraclavicular or inguinal adenopathy Resp: lungs clear to auscultation bilaterally and normal percussion  bilaterally Cardio: regular rate and rhythm. No gallop. GI: soft, nontender, not distended, no mass or organomegaly. Normally active bowel sounds. Surgical incision not remarkable. Musculoskeletal/ Extremities: LE without pitting edema, cords, tenderness Neuro: no change peripheral neuropathy related to taxane. Otherwise nonfocal. PSYCH appropriate mood and affect Skin without rash, ecchymosis, petechiae and no erythema or desquamaton palms. Portacath-without erythema or tenderness  Lab Results:  Results for orders placed or performed in visit on 07/15/16  CBC with Differential  Result Value Ref Range   WBC 4.6 3.9 - 10.3 10e3/uL   NEUT# 2.8 1.5 - 6.5 10e3/uL   HGB 11.0 (L) 11.6 - 15.9 g/dL   HCT 33.9 (L) 34.8 - 46.6 %   Platelets 297 145 - 400 10e3/uL   MCV 104.3 (H) 79.5 - 101.0 fL   MCH 33.8 25.1 - 34.0 pg   MCHC 32.4 31.5 - 36.0 g/dL   RBC 3.25 (L) 3.70 - 5.45 10e6/uL   RDW 14.1 11.2 - 14.5 %   lymph# 1.4 0.9 - 3.3 10e3/uL   MONO# 0.3 0.1 - 0.9 10e3/uL   Eosinophils Absolute 0.1 0.0 - 0.5 10e3/uL   Basophils Absolute 0.0 0.0 - 0.1 10e3/uL   NEUT% 61.1 38.4 - 76.8 %   LYMPH% 29.8 14.0 - 49.7 %   MONO% 7.2 0.0 - 14.0 %   EOS% 1.5 0.0 - 7.0 %   BASO% 0.4 0.0 - 2.0 %  Comprehensive metabolic panel  Result Value Ref Range   Sodium 138 136 - 145 mEq/L  Potassium 4.0 3.5 - 5.1 mEq/L   Chloride 102 98 - 109 mEq/L   CO2 26 22 - 29 mEq/L   Glucose 124 70 - 140 mg/dl   BUN 11.0 7.0 - 26.0 mg/dL   Creatinine 0.8 0.6 - 1.1 mg/dL   Total Bilirubin 0.29 0.20 - 1.20 mg/dL   Alkaline Phosphatase 111 40 - 150 U/L   AST 14 5 - 34 U/L   ALT 13 0 - 55 U/L   Total Protein 9.2 (H) 6.4 - 8.3 g/dL   Albumin 3.0 (L) 3.5 - 5.0 g/dL   Calcium 10.1 8.4 - 10.4 mg/dL   Anion Gap 10 3 - 11 mEq/L   EGFR >90 >90 ml/min/1.73 m2    Iron studies 04-18-16 serum iron 41 and %sat 19  Studies/Results:  No results found.  Medications: I have reviewed the patient's current medications. On oral  iron. Had feraheme x1 12-2015  DISCUSSION Interval history reviewed.  Reviewed doxil skin care, suggested prn baking soda in water if mouth irritation. She understands that skin symptoms and palmar plantar dysesthesia tend to be cumulative on doxil, but interventions can be very helpful in preventing problems. Cytopenias not as likely with doxil as with prior regimens.  She will keep follow up appointment with Dr Sabra Heck next week, then will see Dr Alvy Bimler prior to cycle 2 doxil on 08-08-16 -  thank you.  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  First doxil  07-11-16, tolerated very adequately so far. Suggest CBC ~ 07-29-16 due to previous cytopenias She will see Dr Jacquelyne Balint next week and Dr Alvy Bimler prior to cycle 2 doxil   MSI testing on previous cytology specimen in process.  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 No recurrent effusion by CT 06-21-16 3.reaction to [place] skin test 04-2016 4.iron deficiency anemia: she had feraheme on 12-29-15 (not clear if a second dose in hospital). Transfused 2 units PRBCs early Aug. Iron studies 04-18-16 still low, however hgb better off carboplatin and she continues oral iron, follow. 5.elevated total protein and low albumin: SPEP previously no M spike. 24 hr urine 09-12-15 no M spike. 6.taxol related peripheral neuropathy  7.hypotension: better with florinef increasedto 0.1 mg daily. Patient aware of need for good hydration 8.PAC in 9. Long past tobacco: DCd 09-2015. Emphysematous changes on CT. Known to [name] [facility] pulmonary) 10. Microscopic hematuria: known to Dr Louis Meckel. Urine cytology negative  11.unable to visualize beyond sigmoid on colonoscopy 12-16 due to stricture  12.history substance abuse including ETOH and  marijuana, none now 13. long history anxiety and depression. [place] chaplain knows her. 68.. Restless legs with benadryl 16.flu vaccine 04-05-16 17.possible tendonitis at left deltoid. No information re orthopedics referral 18. Advance directives in place  All questions answered and she knows to call if significant increase in fatigue, bleeding/ bruising, symptoms of infection or other concerns prior to next appointment. Time spent 25 min including >50% counseling and coordination of care. Cc Dr Hazle Nordmann, route PCP  Evlyn Clines, MD   07/15/2016, 5:44 PM

## 2016-07-16 ENCOUNTER — Encounter: Payer: Self-pay | Admitting: Oncology

## 2016-07-24 ENCOUNTER — Telehealth: Payer: Self-pay

## 2016-07-24 NOTE — Telephone Encounter (Signed)
Patient called back, Given appointment time for tomorrow for lab, then Dr. Alvy Bimler and to get IV fluids. Verbalized understanding and to be here at 1015.

## 2016-07-24 NOTE — Telephone Encounter (Signed)
S/w pt and she received call from [place] about her appt.

## 2016-07-24 NOTE — Telephone Encounter (Signed)
Pt called asking to talk to Dr Mariana Kaufman nurse. Pt had 1st time doxil on 1/25. Pt states she feels drunk all the time, no energy, a little coughing. Started a few days after doxil. Dizzy headed drunk feeling. Sits in recliner, not getting out of house much.  Drinks mountain dew to get energy to go to store. Lives by self.  Cough is non productive. No fever.  miralax is helping constipation. Decreased appetite, is drinking boost.  Drinking 2-3 bottles of water per day. Is crying on phone.  She had appt today with Dr Dorena Bodo which she cancelled d/t not feeling well enough to go out. She stated if we need to have her come to [facility] she could probably make it tomorrow, she needs to arrange transportation.

## 2016-07-24 NOTE — Telephone Encounter (Signed)
Can she come in tomorrow with labs and see me at 1115 am, 15 mins? Also 2.5 hours IVF afterwards If so, please place urgent scheduling msg

## 2016-07-25 ENCOUNTER — Ambulatory Visit (HOSPITAL_BASED_OUTPATIENT_CLINIC_OR_DEPARTMENT_OTHER): Payer: Medicare Other | Admitting: Hematology and Oncology

## 2016-07-25 ENCOUNTER — Ambulatory Visit: Payer: Medicare Other

## 2016-07-25 ENCOUNTER — Other Ambulatory Visit (HOSPITAL_BASED_OUTPATIENT_CLINIC_OR_DEPARTMENT_OTHER): Payer: Medicare Other

## 2016-07-25 ENCOUNTER — Encounter: Payer: Self-pay | Admitting: Hematology and Oncology

## 2016-07-25 ENCOUNTER — Ambulatory Visit (HOSPITAL_BASED_OUTPATIENT_CLINIC_OR_DEPARTMENT_OTHER): Payer: Medicare Other | Admitting: Nurse Practitioner

## 2016-07-25 VITALS — BP 104/69 | HR 88 | Temp 97.9°F | Resp 18 | Ht 68.0 in | Wt 139.4 lb

## 2016-07-25 DIAGNOSIS — K1231 Oral mucositis (ulcerative) due to antineoplastic therapy: Secondary | ICD-10-CM | POA: Diagnosis not present

## 2016-07-25 DIAGNOSIS — C7801 Secondary malignant neoplasm of right lung: Secondary | ICD-10-CM | POA: Diagnosis not present

## 2016-07-25 DIAGNOSIS — D6481 Anemia due to antineoplastic chemotherapy: Secondary | ICD-10-CM | POA: Diagnosis not present

## 2016-07-25 DIAGNOSIS — E860 Dehydration: Secondary | ICD-10-CM | POA: Diagnosis not present

## 2016-07-25 DIAGNOSIS — Z95828 Presence of other vascular implants and grafts: Secondary | ICD-10-CM

## 2016-07-25 DIAGNOSIS — R64 Cachexia: Secondary | ICD-10-CM

## 2016-07-25 DIAGNOSIS — C541 Malignant neoplasm of endometrium: Secondary | ICD-10-CM

## 2016-07-25 DIAGNOSIS — C7802 Secondary malignant neoplasm of left lung: Secondary | ICD-10-CM

## 2016-07-25 DIAGNOSIS — T451X5A Adverse effect of antineoplastic and immunosuppressive drugs, initial encounter: Secondary | ICD-10-CM

## 2016-07-25 DIAGNOSIS — K5900 Constipation, unspecified: Secondary | ICD-10-CM

## 2016-07-25 DIAGNOSIS — J910 Malignant pleural effusion: Secondary | ICD-10-CM

## 2016-07-25 DIAGNOSIS — R112 Nausea with vomiting, unspecified: Secondary | ICD-10-CM

## 2016-07-25 DIAGNOSIS — C7800 Secondary malignant neoplasm of unspecified lung: Secondary | ICD-10-CM

## 2016-07-25 LAB — COMPREHENSIVE METABOLIC PANEL
ALBUMIN: 2.7 g/dL — AB (ref 3.5–5.0)
ALT: 37 U/L (ref 0–55)
AST: 24 U/L (ref 5–34)
Alkaline Phosphatase: 148 U/L (ref 40–150)
Anion Gap: 9 mEq/L (ref 3–11)
BUN: 10.3 mg/dL (ref 7.0–26.0)
CHLORIDE: 102 meq/L (ref 98–109)
CO2: 26 meq/L (ref 22–29)
Calcium: 9.9 mg/dL (ref 8.4–10.4)
Creatinine: 0.7 mg/dL (ref 0.6–1.1)
EGFR: >90 mL/min/{1.73_m2} (ref >90)
GLUCOSE: 127 mg/dL (ref 70–140)
POTASSIUM: 4.1 meq/L (ref 3.5–5.1)
SODIUM: 137 meq/L (ref 136–145)
Total Bilirubin: 0.3 mg/dL (ref 0.20–1.20)
Total Protein: 8.9 g/dL — ABNORMAL HIGH (ref 6.4–8.3)

## 2016-07-25 LAB — CBC WITH DIFFERENTIAL/PLATELET
BASO%: 0.8 % (ref 0.0–2.0)
BASOS ABS: 0 10*3/uL (ref 0.0–0.1)
EOS%: 0.8 % (ref 0.0–7.0)
Eosinophils Absolute: 0 10*3/uL (ref 0.0–0.5)
HCT: 30 % — ABNORMAL LOW (ref 34.8–46.6)
HGB: 9.9 g/dL — ABNORMAL LOW (ref 11.6–15.9)
LYMPH%: 32.8 % (ref 14.0–49.7)
MCH: 34 pg (ref 25.1–34.0)
MCHC: 33 g/dL (ref 31.5–36.0)
MCV: 103.1 fL — ABNORMAL HIGH (ref 79.5–101.0)
MONO#: 0.2 10*3/uL (ref 0.1–0.9)
MONO%: 5.2 % (ref 0.0–14.0)
NEUT#: 2.2 10*3/uL (ref 1.5–6.5)
NEUT%: 60.4 % (ref 38.4–76.8)
Platelets: 231 10*3/uL (ref 145–400)
RBC: 2.91 10*6/uL — AB (ref 3.70–5.45)
RDW: 14.6 % — AB (ref 11.2–14.5)
WBC: 3.7 10*3/uL — AB (ref 3.9–10.3)
lymph#: 1.2 10*3/uL (ref 0.9–3.3)

## 2016-07-25 MED ORDER — SODIUM CHLORIDE 0.9% FLUSH
10.0000 mL | Freq: Once | INTRAVENOUS | Status: AC
  Administered 2016-07-25: 10 mL via INTRAVENOUS
  Filled 2016-07-25: qty 10

## 2016-07-25 MED ORDER — SODIUM CHLORIDE 0.9% FLUSH
10.0000 mL | INTRAVENOUS | Status: DC | PRN
  Administered 2016-07-25: 10 mL via INTRAVENOUS
  Filled 2016-07-25: qty 10

## 2016-07-25 MED ORDER — MIRTAZAPINE 15 MG PO TABS: 15.0000 mg | ORAL_TABLET | Freq: Every day | ORAL | 1 refills | Status: DC

## 2016-07-25 MED ORDER — SODIUM CHLORIDE 0.9 % IV SOLN: 10.0000 mg | Freq: Once | INTRAVENOUS | Status: DC

## 2016-07-25 MED ORDER — HEPARIN SOD (PORK) LOCK FLUSH 100 UNIT/ML IV SOLN
500.0000 [IU] | Freq: Once | INTRAVENOUS | Status: AC
  Administered 2016-07-25: 500 [IU] via INTRAVENOUS
  Filled 2016-07-25: qty 5

## 2016-07-25 MED ORDER — DEXAMETHASONE SODIUM PHOSPHATE 10 MG/ML IJ SOLN
INTRAMUSCULAR | Status: AC
  Filled 2016-07-25: qty 1

## 2016-07-25 MED ORDER — SODIUM CHLORIDE 0.9 % IV SOLN
Freq: Once | INTRAVENOUS | Status: AC
  Administered 2016-07-25: 12:00:00 via INTRAVENOUS

## 2016-07-25 MED ORDER — DEXAMETHASONE SODIUM PHOSPHATE 10 MG/ML IJ SOLN
10.0000 mg | Freq: Once | INTRAMUSCULAR | Status: AC
  Administered 2016-07-25: 10 mg via INTRAVENOUS

## 2016-07-25 NOTE — Patient Instructions (Signed)
Dehydration, Adult Dehydration is a condition in which there is not enough fluid or water in the body. This happens when you lose more fluids than you take in. Important organs, such as the kidneys, brain, and heart, cannot function without a proper amount of fluids. Any loss of fluids from the body can lead to dehydration. Dehydration can range from mild to severe. This condition should be treated right away to prevent it from becoming severe. What are the causes? This condition may be caused by:  Vomiting.  Diarrhea.  Excessive sweating, such as from heat exposure or exercise.  Not drinking enough fluid, especially:  When ill.  While doing activity that requires a lot of energy.  Excessive urination.  Fever.  Infection.  Certain medicines, such as medicines that cause the body to lose excess fluid (diuretics).  Inability to access safe drinking water.  Reduced physical ability to get adequate water and food. What increases the risk? This condition is more likely to develop in people:  Who have a poorly controlled long-term (chronic) illness, such as diabetes, heart disease, or kidney disease.  Who are age 65 or older.  Who are disabled.  Who live in a place with high altitude.  Who play endurance sports. What are the signs or symptoms? Symptoms of mild dehydration may include:   Thirst.  Dry lips.  Slightly dry mouth.  Dry, warm skin.  Dizziness. Symptoms of moderate dehydration may include:   Very dry mouth.  Muscle cramps.  Dark urine. Urine may be the color of tea.  Decreased urine production.  Decreased tear production.  Heartbeat that is irregular or faster than normal (palpitations).  Headache.  Light-headedness, especially when you stand up from a sitting position.  Fainting (syncope). Symptoms of severe dehydration may include:   Changes in skin, such as:  Cold and clammy skin.  Blotchy (mottled) or pale skin.  Skin that does  not quickly return to normal after being lightly pinched and released (poor skin turgor).  Changes in body fluids, such as:  Extreme thirst.  No tear production.  Inability to sweat when body temperature is high, such as in hot weather.  Very little urine production.  Changes in vital signs, such as:  Weak pulse.  Pulse that is more than 100 beats a minute when sitting still.  Rapid breathing.  Low blood pressure.  Other changes, such as:  Sunken eyes.  Cold hands and feet.  Confusion.  Lack of energy (lethargy).  Difficulty waking up from sleep.  Short-term weight loss.  Unconsciousness. How is this diagnosed? This condition is diagnosed based on your symptoms and a physical exam. Blood and urine tests may be done to help confirm the diagnosis. How is this treated? Treatment for this condition depends on the severity. Mild or moderate dehydration can often be treated at home. Treatment should be started right away. Do not wait until dehydration becomes severe. Severe dehydration is an emergency and it needs to be treated in a hospital. Treatment for mild dehydration may include:   Drinking more fluids.  Replacing salts and minerals in your blood (electrolytes) that you may have lost. Treatment for moderate dehydration may include:   Drinking an oral rehydration solution (ORS). This is a drink that helps you replace fluids and electrolytes (rehydrate). It can be found at pharmacies and retail stores. Treatment for severe dehydration may include:   Receiving fluids through an IV tube.  Receiving an electrolyte solution through a feeding tube that is   passed through your nose and into your stomach (nasogastric tube, or NG tube).  Correcting any abnormalities in electrolytes.  Treating the underlying cause of dehydration. Follow these instructions at home:  If directed by your health care provider, drink an ORS:  Make an ORS by following instructions on the  package.  Start by drinking small amounts, about  cup (120 mL) every 5-10 minutes.  Slowly increase how much you drink until you have taken the amount recommended by your health care provider.  Drink enough clear fluid to keep your urine clear or pale yellow. If you were told to drink an ORS, finish the ORS first, then start slowly drinking other clear fluids. Drink fluids such as:  Water. Do not drink only water. Doing that can lead to having too little salt (sodium) in the body (hyponatremia).  Ice chips.  Fruit juice that you have added water to (diluted fruit juice).  Low-calorie sports drinks.  Avoid:  Alcohol.  Drinks that contain a lot of sugar. These include high-calorie sports drinks, fruit juice that is not diluted, and soda.  Caffeine.  Foods that are greasy or contain a lot of fat or sugar.  Take over-the-counter and prescription medicines only as told by your health care provider.  Do not take sodium tablets. This can lead to having too much sodium in the body (hypernatremia).  Eat foods that contain a healthy balance of electrolytes, such as bananas, oranges, potatoes, tomatoes, and spinach.  Keep all follow-up visits as told by your health care provider. This is important. Contact a health care provider if:  You have abdominal pain that:  Gets worse.  Stays in one area (localizes).  You have a rash.  You have a stiff neck.  You are more irritable than usual.  You are sleepier or more difficult to wake up than usual.  You feel weak or dizzy.  You feel very thirsty.  You have urinated only a small amount of very dark urine over 6-8 hours. Get help right away if:  You have symptoms of severe dehydration.  You cannot drink fluids without vomiting.  Your symptoms get worse with treatment.  You have a fever.  You have a severe headache.  You have vomiting or diarrhea that:  Gets worse.  Does not go away.  You have blood or green matter  (bile) in your vomit.  You have blood in your stool. This may cause stool to look black and tarry.  You have not urinated in 6-8 hours.  You faint.  Your heart rate while sitting still is over 100 beats a minute.  You have trouble breathing. This information is not intended to replace advice given to you by your health care provider. Make sure you discuss any questions you have with your health care provider. Document Released: 06/03/2005 Document Revised: 12/29/2015 Document Reviewed: 07/28/2015 Elsevier Interactive Patient Education  2017 [employer] Inc.  

## 2016-07-25 NOTE — Progress Notes (Signed)
RN visit only. 

## 2016-07-26 ENCOUNTER — Encounter: Payer: Self-pay | Admitting: Hematology and Oncology

## 2016-07-26 ENCOUNTER — Ambulatory Visit (HOSPITAL_BASED_OUTPATIENT_CLINIC_OR_DEPARTMENT_OTHER): Payer: Medicare Other | Admitting: Nurse Practitioner

## 2016-07-26 VITALS — BP 101/70 | HR 78 | Temp 98.3°F | Resp 16 | Ht 66.5 in | Wt 142.5 lb

## 2016-07-26 DIAGNOSIS — E860 Dehydration: Secondary | ICD-10-CM | POA: Diagnosis not present

## 2016-07-26 DIAGNOSIS — C7802 Secondary malignant neoplasm of left lung: Secondary | ICD-10-CM | POA: Diagnosis not present

## 2016-07-26 DIAGNOSIS — C541 Malignant neoplasm of endometrium: Secondary | ICD-10-CM | POA: Diagnosis not present

## 2016-07-26 DIAGNOSIS — C7801 Secondary malignant neoplasm of right lung: Secondary | ICD-10-CM | POA: Diagnosis not present

## 2016-07-26 DIAGNOSIS — K1231 Oral mucositis (ulcerative) due to antineoplastic therapy: Secondary | ICD-10-CM | POA: Insufficient documentation

## 2016-07-26 MED ORDER — SODIUM CHLORIDE 0.9 % IV SOLN
Freq: Once | INTRAVENOUS | Status: AC
  Administered 2016-07-26: 14:00:00 via INTRAVENOUS

## 2016-07-26 MED ORDER — SODIUM CHLORIDE 0.9 % IV SOLN: 10.0000 mg | Freq: Once | INTRAVENOUS | Status: DC

## 2016-07-26 MED ORDER — HEPARIN SOD (PORK) LOCK FLUSH 100 UNIT/ML IV SOLN
500.0000 [IU] | Freq: Once | INTRAVENOUS | Status: AC | PRN
  Administered 2016-07-26: 500 [IU]
  Filled 2016-07-26: qty 5

## 2016-07-26 MED ORDER — DEXAMETHASONE SODIUM PHOSPHATE 10 MG/ML IJ SOLN
INTRAMUSCULAR | Status: AC
  Filled 2016-07-26: qty 1

## 2016-07-26 MED ORDER — SODIUM CHLORIDE 0.9% FLUSH
10.0000 mL | INTRAVENOUS | Status: DC | PRN
  Administered 2016-07-26: 10 mL
  Filled 2016-07-26: qty 10

## 2016-07-26 MED ORDER — DEXAMETHASONE SODIUM PHOSPHATE 10 MG/ML IJ SOLN
10.0000 mg | Freq: Once | INTRAMUSCULAR | Status: AC
  Administered 2016-07-26: 10 mg via INTRAVENOUS

## 2016-07-26 NOTE — Progress Notes (Signed)
[name] FOLLOW-UP progress notes  Patient Care Team: Ardith Dark, PA-C as PCP - General (Physician Assistant)  CHIEF COMPLAINTS/PURPOSE OF VISIT:  Recurrent endometrial cancer  HISTORY OF PRESENTING ILLNESS:  Sheena Caldwell 66 y.o. female was transferred to my care after her prior physician has left.  I reviewed the patient's records extensive and collaborated the history with the patient. Summary of her history is as follows:   Endometrial cancer ([place])   03/07/2014 Imaging    CT scan showed large uterine mass      03/07/2014 Initial Diagnosis    Surgery by Dr Jacquelyne Balint at [facility] in [place] on 04-06-14 TAH, BSO, omentectomy, pelvic and right common iliac node evaluation. 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 Bulk of the carcinoma was in lower uterine segment where it was within 0.1 cm of serosal surface. Cytology positive for adenocarcinoma on washings [id number]). Surgical findings were significant for no paraaortic adenopathy apparent. Patient received 3 cycles of adjuvant chemotherapy in [place] by Dr Sabra Heck from11-19-15 thru2-12-16, with neulasta      08/15/2014 Imaging    Interval hysterectomy. 2. Retroperitoneal low-density structures which are suspicious for necrotic adenopathy/metastasis. Left periaortic component is slightly decreased in size. However, there is new soft tissue thickening about the abdominal aorta more superiorly. Less likely differential considerations include benign lymphangioma/lymphangiomas and/or concurrent vasculitis. Consider PET for further evaluation. 3. Development of bilateral pleural effusions with left base  atelectasis. 4. Indeterminate bibasilar pulmonary nodules, felt to be similar. Consider further evaluation with chest CT, as recommended on 03/25/2014. 5.  Possible constipation.      08/24/2014 - 10/21/2014 Chemotherapy    She received 2 cycles of carbo/taxol. Cycle 3 with [place] only       11/10/2014 Imaging    Prior periaortic lucency of concern at the hiatus has resolved and was probably transient and inflammatory or due to third spacing of fluid. The pericardial effusion and pleural effusions have also resolved. 2. Upper normal sized lymph node just above the right inferior pulmonary vein is new compared to the prior exam and may merit Observation. 3. Tiny scattered pulmonary nodules remain stable. 4.  Prominent stool throughout the colon favors constipation. 5.  Aortoiliac atherosclerotic vascular disease. 6. Degenerative arthropathy of the hips and degenerative disc disease in the lumbar spine. 7. Small ventral supraumbilical hernia contains adipose tissue. 8. Emphysema. 9. Left anterior descending coronary artery atherosclerosis. 10. Ascending thoracic aortic aneurysm.       12/25/2014 Imaging    No acute findings within the abdomen or pelvis. No evidence of recurrent or metastatic carcinoma. Large stool burden again demonstrated; suggest clinical correlation for possible constipation.       01/03/2015 - 01/26/2015 Radiation Therapy    July 19, July 27, July 29, August 9, August 11 Site/dose: Proximal vagina, 30 gray in 5 fractions      11/30/2015 Imaging    Significant progression of disease, as detailed above. This includes numerous pulmonary masses/nodules within the right lung, largest of which is at the right lung base measuring 3.1 x 2.6 cm. A new pleural based mass is now seen along the medial aspects of the left upper lobe measuring 2.6 x 1.7 cm. On earlier chest  CT of 11/04/2014, the largest pulmonary nodule identified within the left lung was 6 mm and the largest pulmonary nodule  identified within the right lung was 4 mm. 2. Mediastinal and perihilar lymphadenopathy, most numerous within the left lower paratracheal and aortopulmonary window regions, with largest lymph nodes in the right perihilar region demonstrating evidence of central necrosis, almost certainly compatible with metastatic lymphadenopathy throughout and further evidence of progression of disease. 3. Large left pleural effusion, likely malignant effusion, with adjacent compressive atelectasis. Heart is displaced to the right by the large left pleural effusion. 4. Emphysematous change, upper lobe predominant. 5. No definite evidence of osseous metastasis. Scattered tiny lucent foci within the thoracic vertebral bodies could conceivably represent early developing metastases.      11/30/2015 - 12/06/2015 Hospital Admission    She was admitted for severe shortness of breath and was found to have malignant effusion      12/01/2015 Imaging    As seen on yesterday's chest CT, there is metastatic disease to both lung bases with a large malignant left pleural effusion. There are several pleural-based metastases on the left and right infrahilar lymphadenopathy, incompletely visualized. 2. Left periaortic and left pelvic side wall lymphadenopathy consistent with metastatic disease. No other evidence of abdominal  pelvic metastatic disease. 3. No hydronephrosis.      12/04/2015 Procedure    Successful placement of a tunneled left pleural catheter with ultrasound and fluoroscopic guidance.       12/22/2015 Pathology Results    Pleura, peel, Left pleural peel - RARE MALIGNANT CELLS. - SEE MICROSCOPIC DESCRIPTION Microscopic Comment The specimen consists of abundant fibrin with rare minute aggregates of epithelial cells with malignant features consistent with poorly differentiated carcinoma.       12/22/2015 - 12/26/2015 Hospital Admission    She was admitted for management of malignant pleural effusion       12/22/2015 Surgery    OPERATION:  Left VATS (video-assisted thoracoscopic surgery) with drainage of loculated malignant effusion, chemical pleurodesis with doxycycline, placement of PleurX catheter.  PREOPERATIVE DIAGNOSES:  Loculated left malignant effusion, history of endometrial carcinoma, advanced age.  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      01/04/2016 - 05/16/2016 Chemotherapy    She received carboplatin & Taxol       03/19/2016 Imaging    Interval response to therapy. 2. Decrease in volume of left pleural effusion. The index lesions within the chest have decreased in size in the interval. 3. Interval decrease in size of retroperitoneal and left pelvic side wall adenopathy. 4. Aortic atherosclerosis.       06/21/2016 Imaging    Apparent interval mixed response to therapy. Pleural disease in the chest has progressed along with progression of mediastinal lymphadenopathy. Some pulmonary nodules progressed while others have decreased in size in the interval. The index abdominal retroperitoneal left pelvic sidewall lymph nodes have decreased in the interval. 2. Persistent small left pleural effusion with posterior left base collapse/consolidation. 3.  Abdominal Aortic Atherosclerois       07/10/2016 Imaging    Left ventricle: The cavity size was normal. Wall thickness was increased in a pattern of mild LVH. Systolic function was normal. The estimated ejection fraction was in the range of 60% to 65%. Wall motion was normal; there were no regional wall motion abnormalities. Doppler parameters are consistent with abnormal left ventricular relaxation (grade 1 diastolic dysfunction).       07/11/2016 -  Chemotherapy    She received Doxil only       07/25/2016 Adverse Reaction    She felt dehydrated, dizzy, had mucositis, constipated and miserable      She called for urgent  appointment as she is not doing well She had significant side-effects from recent chemo with weakness, anorexia, dehydration, worsening constipation, dizziness and mucositis  MEDICAL HISTORY:  Past Medical History:  Diagnosis Date  . Anemia   . Arthritis   . Blood transfusion without reported diagnosis    2015, 2016  . Constipation   . COPD (chronic obstructive pulmonary disease) ([place])   . Radiation 01/03/15, 01/11/15, 01/13/15, 01/24/15, 01/26/15   proximal vagina 30 gray  . Uterine cancer [facility])    finished chemo May 2016, to start radiation July 2016    SURGICAL HISTORY: Past Surgical History:  Procedure Laterality Date  . CESAREAN SECTION  1991  . CHEST TUBE INSERTION Left 12/22/2015   Procedure: INSERTION PLEURAL DRAINAGE CATHETER;  Surgeon: Ivin Poot, MD;  Location: [place];  Service: Thoracic;  Laterality: Left;  . COLONOSCOPY    . INCISE AND DRAIN ABCESS  1970   scalp  . PLEURADESIS Left 12/22/2015   Procedure: PLEURADESIS;  Surgeon: Ivin Poot, MD;  Location: [facility];  Service: Thoracic;  Laterality: Left;  . PLEURAL EFFUSION DRAINAGE Left 12/22/2015   Procedure: DRAINAGE OF PLEURAL EFFUSION;  Surgeon: Ivin Poot, MD;  Location: [facility];  Service: Thoracic;  Laterality: Left;  . PORTACATH PLACEMENT  04/26/2014   rt. power port with tip in SVC  . TOTAL ABDOMINAL HYSTERECTOMY W/ BILATERAL SALPINGOOPHORECTOMY  04/06/14   TAH, BSO, omentectomy, pelvic and right common iliac node evaluation  . VIDEO ASSISTED THORACOSCOPY Left 12/22/2015   Procedure: VIDEO ASSISTED THORACOSCOPY;  Surgeon: Ivin Poot, MD;  Location: [facility] OR;  Service: Thoracic;  Laterality: Left;    SOCIAL HISTORY: Social History   Social History  . Marital status: Single    Spouse name: n/a  . Number of children: 2  . Years of education: 13   Occupational History  . customer service representative [employer]   Social History Main Topics  . Smoking status: Former Smoker     Packs/day: 1.00    Years: 30.00    Types: Cigarettes    Quit date: 07/16/2013  . Smokeless tobacco: Never Used  . Alcohol use 1.2 - 1.8 oz/week    2 - 3 Glasses of wine per week     Comment: none now  . Drug use: Yes    Types: Marijuana     Comment: 7 to 9 joints each week "to relax"  . Sexual activity: Not Currently    Partners: Male    Birth control/ protection: Post-menopausal   Other Topics Concern  . Not on file   Social History Narrative   Daughter graduated from college 2013, plans to go to Sports coach school, lives in [place].  Son lives in [place].    FAMILY HISTORY: Family History  Problem Relation Age of Onset  . Hypertension Mother   . Cancer Father   . Colon cancer Neg Hx   . Esophageal cancer Neg Hx   . Rectal cancer Neg Hx   . Stomach cancer Neg Hx     ALLERGIES:  is allergic to carboplatin and levaquin [levofloxacin in d5w].  MEDICATIONS:  Current Outpatient Prescriptions  Medication Sig Dispense Refill  . Calcium Citrate-Vitamin D (CALCIUM + D PO) Take  1 tablet by mouth daily.    . cholecalciferol (VITAMIN D) 1000 units tablet Take 1,000 Units by mouth daily.    . Cyanocobalamin (VITAMIN B-12 PO) Take 1 tablet by mouth daily.    . ferrous sulfate 325 (65 FE) MG tablet Take 325 mg by mouth daily.    . fludrocortisone (FLORINEF) 0.1 MG tablet Take 1 tablet (0.1 mg total) by mouth daily. 30 tablet 2  . loratadine (CLARITIN) 10 MG tablet Take 10 mg by mouth daily as needed for allergies.    . mirtazapine (REMERON) 15 MG tablet Take 1 tablet (15 mg total) by mouth at bedtime. 30 tablet 1  . Multiple Vitamin (MULTI-VITAMINS) TABS Take 1 tablet by mouth daily.    . naproxen sodium (ANAPROX) 220 MG tablet Take 220 mg by mouth 2 (two) times daily as needed.    . polyethylene glycol (MIRALAX / GLYCOLAX) packet Take 17 g by mouth daily as needed for mild constipation.     Marland Kitchen PROAIR HFA 108 (90 Base) MCG/ACT inhaler INHALE 1 PUFF BY MOUTH FOUR TIMES DAILY (Patient  not taking: Reported on 07/01/2016) 8.5 g 0  . promethazine (PHENERGAN) 12.5 MG tablet Take 1 tablet (12.5 mg total) by mouth every 6 (six) hours as needed for nausea or vomiting. (Patient not taking: Reported on 07/01/2016) 30 tablet 0  . traMADol (ULTRAM) 50 MG tablet Take 1 tablet (50 mg total) by mouth every 6 (six) hours as needed. 30 tablet 0   No current facility-administered medications for this visit.    Facility-Administered Medications Ordered in Other Visits  Medication Dose Route Frequency Provider Last Rate Last Dose  . sodium chloride 0.9 % injection 10 mL  10 mL Intravenous PRN Lennis Marion Downer, MD   10 mL at 02/15/16 1601    REVIEW OF SYSTEMS:   Constitutional: Denies fevers, chills or abnormal night sweats Eyes: Denies blurriness of vision, double vision or watery eyes Respiratory: Denies cough, dyspnea or wheezes Cardiovascular: Denies palpitation, chest discomfort or lower extremity swelling Skin: Denies abnormal skin rashes Lymphatics: Denies new lymphadenopathy or easy bruising Behavioral/Psych: Mood is stable, no new changes  All other systems were reviewed with the patient and are negative.  PHYSICAL EXAMINATION: ECOG PERFORMANCE STATUS: 2 - Symptomatic, <50% confined to bed  Vitals:   07/25/16 1055  BP: 104/69  Pulse: 88  Resp: 18  Temp: 97.9 F (36.6 C)   Filed Weights   07/25/16 1055  Weight: 139 lb 6.4 oz (63.2 kg)    GENERAL:alert, no distress and comfortable. She s ill appearing SKIN: skin color, texture, turgor are normal, no rashes or significant lesions EYES: normal, conjunctiva are pink and non-injected, sclera clear OROPHARYNX:dyr mucous membrane, no signs of blisters  NECK: supple, thyroid normal size, non-tender, without nodularity LYMPH:  no palpable lymphadenopathy in the cervical, axillary or inguinal LUNGS: clear to auscultation and percussion with normal breathing effort HEART: regular rate & rhythm and no murmurs without lower  extremity edema ABDOMEN:abdomen soft, non-tender and normal bowel sounds Musculoskeletal:no cyanosis of digits and no clubbing  PSYCH: alert & oriented x 3 with fluent speech NEURO: no focal motor/sensory deficits  LABORATORY DATA:  I have reviewed the data as listed Lab Results  Component Value Date   WBC 3.7 (L) 07/25/2016   HGB 9.9 (L) 07/25/2016   HCT 30.0 (L) 07/25/2016   MCV 103.1 (H) 07/25/2016   PLT 231 07/25/2016    Recent Labs  12/22/15 0844 12/23/15 0404 12/24/15 [id number]  07/11/16 1106 07/15/16 1107 07/25/16 1016  NA 137 137 138  < > 139 138 137  K 3.8 4.0 3.6  < > 4.2 4.0 4.1  CL 106 106 101  --   --   --   --   CO2 22 25 27   < > 28 26 26   GLUCOSE 94 123* 95  < > 81 124 127  BUN 9 7 5*  < > 14.8 11.0 10.3  CREATININE 0.62 0.69 0.64  < > 0.7 0.8 0.7  CALCIUM 8.8* 8.5* 9.1  < > 10.0 10.1 9.9  GFRNONAA >60 >60 >60  --   --   --   --   GFRAA >60 >60 >60  --   --   --   --   PROT 8.6*  --  8.0  < > 9.1* 9.2* 8.9*  ALBUMIN 2.2*  --  1.9*  < > 3.2* 3.0* 2.7*  AST 25  --  17  < > 13 14 24   ALT 29  --  20  < > 9 13 37  ALKPHOS 121  --  108  < > 108 111 148  BILITOT 0.2*  --  0.5  < > 0.22 0.29 0.30  < > = values in this interval not displayed.  ASSESSMENT & PLAN:  Endometrial cancer ([place]) This is a very complex case of a patient with recent platinum refractory disease She tolerated chemotherapy very poorly with numerous side effects including significant dehydration, malaise, anorexia, mucositis, worsening constipation and dizziness I recommend aggressive IVF hydration daily for next few days Will hold chemotherapy  Chemotherapy induced nausea and vomiting Due to recent treatment She is dehydrated Will provide supportive care  Constipation She has chronic constipation with possible stricture noted form previous colonoscopy We discussed aggressive laxatives  Malignant neoplasm metastatic to lung [facility]) She has no symptoms of dyspnea  Dehydration Will  provide IVF support  Antineoplastic chemotherapy induced anemia This is likely due to recent treatment. The patient denies recent history of bleeding such as epistaxis, hematuria or hematochezia. She is asymptomatic from the anemia. I will observe for now.    Cachexia ([place]) Due to disease and recent chemo I will discuss with her potential use of appetite stimulant in our next visit  Mucositis due to antineoplastic therapy Will provide Magic Mouth Wash solution   Orders Placed This Encounter  Procedures  . CBC with Differential/Platelet    Standing Status:   Standing    Number of Occurrences:   22    Standing Expiration Date:   07/26/2017  . Comprehensive metabolic panel    Standing Status:   Standing    Number of Occurrences:   22    Standing Expiration Date:   07/26/2017    All questions were answered. The patient knows to call the clinic with any problems, questions or concerns. I spent 40 minutes counseling the patient face to face. The total time spent in the appointment was 80 minutes and more than 50% was on counseling.     Heath Lark, MD 07/26/2016 11:40 PM

## 2016-07-26 NOTE — Assessment & Plan Note (Signed)
She has chronic constipation with possible stricture noted form previous colonoscopy We discussed aggressive laxatives

## 2016-07-26 NOTE — Assessment & Plan Note (Signed)
This is likely due to recent treatment. The patient denies recent history of bleeding such as epistaxis, hematuria or hematochezia. She is asymptomatic from the anemia. I will observe for now.   

## 2016-07-26 NOTE — Assessment & Plan Note (Signed)
She has no symptoms of dyspnea

## 2016-07-26 NOTE — Assessment & Plan Note (Signed)
Due to recent treatment She is dehydrated Will provide supportive care

## 2016-07-26 NOTE — Assessment & Plan Note (Addendum)
This is a very complex case of a patient with recent platinum refractory disease She tolerated chemotherapy very poorly with numerous side effects including significant dehydration, malaise, anorexia, mucositis, worsening constipation and dizziness I recommend aggressive IVF hydration daily for next few days Will hold chemotherapy

## 2016-07-26 NOTE — Assessment & Plan Note (Signed)
Will provide Magic Mouth Wash solution

## 2016-07-26 NOTE — Assessment & Plan Note (Signed)
Will provide IVF support

## 2016-07-26 NOTE — Patient Instructions (Signed)
Dehydration, Adult Dehydration is a condition in which there is not enough fluid or water in the body. This happens when you lose more fluids than you take in. Important organs, such as the kidneys, brain, and heart, cannot function without a proper amount of fluids. Any loss of fluids from the body can lead to dehydration. Dehydration can range from mild to severe. This condition should be treated right away to prevent it from becoming severe. What are the causes? This condition may be caused by:  Vomiting.  Diarrhea.  Excessive sweating, such as from heat exposure or exercise.  Not drinking enough fluid, especially:  When ill.  While doing activity that requires a lot of energy.  Excessive urination.  Fever.  Infection.  Certain medicines, such as medicines that cause the body to lose excess fluid (diuretics).  Inability to access safe drinking water.  Reduced physical ability to get adequate water and food. What increases the risk? This condition is more likely to develop in people:  Who have a poorly controlled long-term (chronic) illness, such as diabetes, heart disease, or kidney disease.  Who are age 65 or older.  Who are disabled.  Who live in a place with high altitude.  Who play endurance sports. What are the signs or symptoms? Symptoms of mild dehydration may include:   Thirst.  Dry lips.  Slightly dry mouth.  Dry, warm skin.  Dizziness. Symptoms of moderate dehydration may include:   Very dry mouth.  Muscle cramps.  Dark urine. Urine may be the color of tea.  Decreased urine production.  Decreased tear production.  Heartbeat that is irregular or faster than normal (palpitations).  Headache.  Light-headedness, especially when you stand up from a sitting position.  Fainting (syncope). Symptoms of severe dehydration may include:   Changes in skin, such as:  Cold and clammy skin.  Blotchy (mottled) or pale skin.  Skin that does  not quickly return to normal after being lightly pinched and released (poor skin turgor).  Changes in body fluids, such as:  Extreme thirst.  No tear production.  Inability to sweat when body temperature is high, such as in hot weather.  Very little urine production.  Changes in vital signs, such as:  Weak pulse.  Pulse that is more than 100 beats a minute when sitting still.  Rapid breathing.  Low blood pressure.  Other changes, such as:  Sunken eyes.  Cold hands and feet.  Confusion.  Lack of energy (lethargy).  Difficulty waking up from sleep.  Short-term weight loss.  Unconsciousness. How is this diagnosed? This condition is diagnosed based on your symptoms and a physical exam. Blood and urine tests may be done to help confirm the diagnosis. How is this treated? Treatment for this condition depends on the severity. Mild or moderate dehydration can often be treated at home. Treatment should be started right away. Do not wait until dehydration becomes severe. Severe dehydration is an emergency and it needs to be treated in a hospital. Treatment for mild dehydration may include:   Drinking more fluids.  Replacing salts and minerals in your blood (electrolytes) that you may have lost. Treatment for moderate dehydration may include:   Drinking an oral rehydration solution (ORS). This is a drink that helps you replace fluids and electrolytes (rehydrate). It can be found at pharmacies and retail stores. Treatment for severe dehydration may include:   Receiving fluids through an IV tube.  Receiving an electrolyte solution through a feeding tube that is   passed through your nose and into your stomach (nasogastric tube, or NG tube).  Correcting any abnormalities in electrolytes.  Treating the underlying cause of dehydration. Follow these instructions at home:  If directed by your health care provider, drink an ORS:  Make an ORS by following instructions on the  package.  Start by drinking small amounts, about  cup (120 mL) every 5-10 minutes.  Slowly increase how much you drink until you have taken the amount recommended by your health care provider.  Drink enough clear fluid to keep your urine clear or pale yellow. If you were told to drink an ORS, finish the ORS first, then start slowly drinking other clear fluids. Drink fluids such as:  Water. Do not drink only water. Doing that can lead to having too little salt (sodium) in the body (hyponatremia).  Ice chips.  Fruit juice that you have added water to (diluted fruit juice).  Low-calorie sports drinks.  Avoid:  Alcohol.  Drinks that contain a lot of sugar. These include high-calorie sports drinks, fruit juice that is not diluted, and soda.  Caffeine.  Foods that are greasy or contain a lot of fat or sugar.  Take over-the-counter and prescription medicines only as told by your health care provider.  Do not take sodium tablets. This can lead to having too much sodium in the body (hypernatremia).  Eat foods that contain a healthy balance of electrolytes, such as bananas, oranges, potatoes, tomatoes, and spinach.  Keep all follow-up visits as told by your health care provider. This is important. Contact a health care provider if:  You have abdominal pain that:  Gets worse.  Stays in one area (localizes).  You have a rash.  You have a stiff neck.  You are more irritable than usual.  You are sleepier or more difficult to wake up than usual.  You feel weak or dizzy.  You feel very thirsty.  You have urinated only a small amount of very dark urine over 6-8 hours. Get help right away if:  You have symptoms of severe dehydration.  You cannot drink fluids without vomiting.  Your symptoms get worse with treatment.  You have a fever.  You have a severe headache.  You have vomiting or diarrhea that:  Gets worse.  Does not go away.  You have blood or green matter  (bile) in your vomit.  You have blood in your stool. This may cause stool to look black and tarry.  You have not urinated in 6-8 hours.  You faint.  Your heart rate while sitting still is over 100 beats a minute.  You have trouble breathing. This information is not intended to replace advice given to you by your health care provider. Make sure you discuss any questions you have with your health care provider. Document Released: 06/03/2005 Document Revised: 12/29/2015 Document Reviewed: 07/28/2015 Elsevier Interactive Patient Education  2017 [employer] Inc.  

## 2016-07-26 NOTE — Progress Notes (Signed)
RN visit only. 

## 2016-07-26 NOTE — Assessment & Plan Note (Signed)
Due to disease and recent chemo I will discuss with her potential use of appetite stimulant in our next visit

## 2016-07-27 ENCOUNTER — Ambulatory Visit (HOSPITAL_BASED_OUTPATIENT_CLINIC_OR_DEPARTMENT_OTHER): Payer: Medicare Other

## 2016-07-27 VITALS — BP 111/83 | HR 89 | Temp 97.8°F | Resp 17

## 2016-07-27 DIAGNOSIS — C541 Malignant neoplasm of endometrium: Secondary | ICD-10-CM

## 2016-07-27 MED ORDER — SODIUM CHLORIDE 0.9% FLUSH
10.0000 mL | INTRAVENOUS | Status: DC | PRN
  Administered 2016-07-27: 10 mL
  Filled 2016-07-27: qty 10

## 2016-07-27 MED ORDER — HEPARIN SOD (PORK) LOCK FLUSH 100 UNIT/ML IV SOLN
250.0000 [IU] | Freq: Once | INTRAVENOUS | Status: AC | PRN
Start: 2016-07-27 — End: 2016-07-27
  Administered 2016-07-27: 250 [IU]
  Filled 2016-07-27: qty 5

## 2016-07-27 MED ORDER — SODIUM CHLORIDE 0.9 % IV SOLN
Freq: Once | INTRAVENOUS | Status: AC
Start: 2016-07-27 — End: 2016-07-27
  Administered 2016-07-27: 11:00:00 via INTRAVENOUS

## 2016-07-27 MED ORDER — DEXAMETHASONE SODIUM PHOSPHATE 10 MG/ML IJ SOLN
INTRAMUSCULAR | Status: AC
  Filled 2016-07-27: qty 1

## 2016-07-27 MED ORDER — SODIUM CHLORIDE 0.9 % IV SOLN
10.0000 mg | Freq: Once | INTRAVENOUS | Status: AC
  Administered 2016-07-27: 10 mg via INTRAVENOUS

## 2016-07-29 ENCOUNTER — Ambulatory Visit (HOSPITAL_BASED_OUTPATIENT_CLINIC_OR_DEPARTMENT_OTHER): Payer: Medicare Other | Admitting: Nurse Practitioner

## 2016-07-29 ENCOUNTER — Encounter: Payer: Self-pay | Admitting: Hematology and Oncology

## 2016-07-29 ENCOUNTER — Ambulatory Visit: Payer: Medicare Other

## 2016-07-29 ENCOUNTER — Other Ambulatory Visit (HOSPITAL_BASED_OUTPATIENT_CLINIC_OR_DEPARTMENT_OTHER): Payer: Medicare Other

## 2016-07-29 VITALS — BP 118/68 | HR 75 | Temp 98.7°F | Resp 18 | Ht 66.5 in | Wt 142.2 lb

## 2016-07-29 DIAGNOSIS — C541 Malignant neoplasm of endometrium: Secondary | ICD-10-CM

## 2016-07-29 DIAGNOSIS — E860 Dehydration: Secondary | ICD-10-CM

## 2016-07-29 DIAGNOSIS — C7801 Secondary malignant neoplasm of right lung: Secondary | ICD-10-CM | POA: Diagnosis not present

## 2016-07-29 DIAGNOSIS — C7802 Secondary malignant neoplasm of left lung: Secondary | ICD-10-CM

## 2016-07-29 LAB — COMPREHENSIVE METABOLIC PANEL
ALT: 29 U/L (ref 0–55)
ANION GAP: 8 meq/L (ref 3–11)
AST: 17 U/L (ref 5–34)
Albumin: 2.6 g/dL — ABNORMAL LOW (ref 3.5–5.0)
Alkaline Phosphatase: 122 U/L (ref 40–150)
BUN: 12.4 mg/dL (ref 7.0–26.0)
CALCIUM: 10.1 mg/dL (ref 8.4–10.4)
CHLORIDE: 104 meq/L (ref 98–109)
CO2: 28 meq/L (ref 22–29)
CREATININE: 0.7 mg/dL (ref 0.6–1.1)
EGFR: >90 mL/min/{1.73_m2} (ref >90)
Glucose: 93 mg/dl (ref 70–140)
Potassium: 3.9 mEq/L (ref 3.5–5.1)
Sodium: 139 mEq/L (ref 136–145)
TOTAL PROTEIN: 8.6 g/dL — AB (ref 6.4–8.3)
Total Bilirubin: 0.26 mg/dL (ref 0.20–1.20)

## 2016-07-29 LAB — CBC WITH DIFFERENTIAL/PLATELET
BASO%: 0.9 % (ref 0.0–2.0)
Basophils Absolute: 0 10*3/uL (ref 0.0–0.1)
EOS%: 0.2 % (ref 0.0–7.0)
Eosinophils Absolute: 0 10*3/uL (ref 0.0–0.5)
HEMATOCRIT: 30.7 % — AB (ref 34.8–46.6)
HGB: 10.4 g/dL — ABNORMAL LOW (ref 11.6–15.9)
LYMPH#: 1.4 10*3/uL (ref 0.9–3.3)
LYMPH%: 31.2 % (ref 14.0–49.7)
MCH: 35.2 pg — ABNORMAL HIGH (ref 25.1–34.0)
MCHC: 34 g/dL (ref 31.5–36.0)
MCV: 103.6 fL — ABNORMAL HIGH (ref 79.5–101.0)
MONO#: 0.4 10*3/uL (ref 0.1–0.9)
MONO%: 9.1 % (ref 0.0–14.0)
NEUT%: 58.6 % (ref 38.4–76.8)
NEUTROS ABS: 2.6 10*3/uL (ref 1.5–6.5)
PLATELETS: 315 10*3/uL (ref 145–400)
RBC: 2.96 10*6/uL — AB (ref 3.70–5.45)
RDW: 15.5 % — ABNORMAL HIGH (ref 11.2–14.5)
WBC: 4.4 10*3/uL (ref 3.9–10.3)

## 2016-07-29 MED ORDER — HEPARIN SOD (PORK) LOCK FLUSH 100 UNIT/ML IV SOLN
250.0000 [IU] | Freq: Once | INTRAVENOUS | Status: DC | PRN
  Filled 2016-07-29: qty 5

## 2016-07-29 MED ORDER — SODIUM CHLORIDE 0.9 % IV SOLN
Freq: Once | INTRAVENOUS | Status: AC
  Administered 2016-07-29: 13:00:00 via INTRAVENOUS

## 2016-07-29 MED ORDER — HEPARIN SOD (PORK) LOCK FLUSH 100 UNIT/ML IV SOLN
500.0000 [IU] | Freq: Once | INTRAVENOUS | Status: AC | PRN
  Administered 2016-07-29: 500 [IU]
  Filled 2016-07-29: qty 5

## 2016-07-29 MED ORDER — SODIUM CHLORIDE 0.9% FLUSH
10.0000 mL | INTRAVENOUS | Status: DC | PRN
  Administered 2016-07-29: 10 mL
  Filled 2016-07-29: qty 10

## 2016-07-29 MED ORDER — SODIUM CHLORIDE 0.9 % IV SOLN: 10.0000 mg | Freq: Once | INTRAVENOUS | Status: DC

## 2016-07-29 MED ORDER — DEXAMETHASONE SODIUM PHOSPHATE 10 MG/ML IJ SOLN: 10.0000 mg | Freq: Once | INTRAMUSCULAR | Status: DC

## 2016-07-29 NOTE — Patient Instructions (Signed)
Dehydration, Adult Dehydration is a condition in which there is not enough fluid or water in the body. This happens when you lose more fluids than you take in. Important organs, such as the kidneys, brain, and heart, cannot function without a proper amount of fluids. Any loss of fluids from the body can lead to dehydration. Dehydration can range from mild to severe. This condition should be treated right away to prevent it from becoming severe. What are the causes? This condition may be caused by:  Vomiting.  Diarrhea.  Excessive sweating, such as from heat exposure or exercise.  Not drinking enough fluid, especially:  When ill.  While doing activity that requires a lot of energy.  Excessive urination.  Fever.  Infection.  Certain medicines, such as medicines that cause the body to lose excess fluid (diuretics).  Inability to access safe drinking water.  Reduced physical ability to get adequate water and food. What increases the risk? This condition is more likely to develop in people:  Who have a poorly controlled long-term (chronic) illness, such as diabetes, heart disease, or kidney disease.  Who are age 65 or older.  Who are disabled.  Who live in a place with high altitude.  Who play endurance sports. What are the signs or symptoms? Symptoms of mild dehydration may include:   Thirst.  Dry lips.  Slightly dry mouth.  Dry, warm skin.  Dizziness. Symptoms of moderate dehydration may include:   Very dry mouth.  Muscle cramps.  Dark urine. Urine may be the color of tea.  Decreased urine production.  Decreased tear production.  Heartbeat that is irregular or faster than normal (palpitations).  Headache.  Light-headedness, especially when you stand up from a sitting position.  Fainting (syncope). Symptoms of severe dehydration may include:   Changes in skin, such as:  Cold and clammy skin.  Blotchy (mottled) or pale skin.  Skin that does  not quickly return to normal after being lightly pinched and released (poor skin turgor).  Changes in body fluids, such as:  Extreme thirst.  No tear production.  Inability to sweat when body temperature is high, such as in hot weather.  Very little urine production.  Changes in vital signs, such as:  Weak pulse.  Pulse that is more than 100 beats a minute when sitting still.  Rapid breathing.  Low blood pressure.  Other changes, such as:  Sunken eyes.  Cold hands and feet.  Confusion.  Lack of energy (lethargy).  Difficulty waking up from sleep.  Short-term weight loss.  Unconsciousness. How is this diagnosed? This condition is diagnosed based on your symptoms and a physical exam. Blood and urine tests may be done to help confirm the diagnosis. How is this treated? Treatment for this condition depends on the severity. Mild or moderate dehydration can often be treated at home. Treatment should be started right away. Do not wait until dehydration becomes severe. Severe dehydration is an emergency and it needs to be treated in a hospital. Treatment for mild dehydration may include:   Drinking more fluids.  Replacing salts and minerals in your blood (electrolytes) that you may have lost. Treatment for moderate dehydration may include:   Drinking an oral rehydration solution (ORS). This is a drink that helps you replace fluids and electrolytes (rehydrate). It can be found at pharmacies and retail stores. Treatment for severe dehydration may include:   Receiving fluids through an IV tube.  Receiving an electrolyte solution through a feeding tube that is   passed through your nose and into your stomach (nasogastric tube, or NG tube).  Correcting any abnormalities in electrolytes.  Treating the underlying cause of dehydration. Follow these instructions at home:  If directed by your health care provider, drink an ORS:  Make an ORS by following instructions on the  package.  Start by drinking small amounts, about  cup (120 mL) every 5-10 minutes.  Slowly increase how much you drink until you have taken the amount recommended by your health care provider.  Drink enough clear fluid to keep your urine clear or pale yellow. If you were told to drink an ORS, finish the ORS first, then start slowly drinking other clear fluids. Drink fluids such as:  Water. Do not drink only water. Doing that can lead to having too little salt (sodium) in the body (hyponatremia).  Ice chips.  Fruit juice that you have added water to (diluted fruit juice).  Low-calorie sports drinks.  Avoid:  Alcohol.  Drinks that contain a lot of sugar. These include high-calorie sports drinks, fruit juice that is not diluted, and soda.  Caffeine.  Foods that are greasy or contain a lot of fat or sugar.  Take over-the-counter and prescription medicines only as told by your health care provider.  Do not take sodium tablets. This can lead to having too much sodium in the body (hypernatremia).  Eat foods that contain a healthy balance of electrolytes, such as bananas, oranges, potatoes, tomatoes, and spinach.  Keep all follow-up visits as told by your health care provider. This is important. Contact a health care provider if:  You have abdominal pain that:  Gets worse.  Stays in one area (localizes).  You have a rash.  You have a stiff neck.  You are more irritable than usual.  You are sleepier or more difficult to wake up than usual.  You feel weak or dizzy.  You feel very thirsty.  You have urinated only a small amount of very dark urine over 6-8 hours. Get help right away if:  You have symptoms of severe dehydration.  You cannot drink fluids without vomiting.  Your symptoms get worse with treatment.  You have a fever.  You have a severe headache.  You have vomiting or diarrhea that:  Gets worse.  Does not go away.  You have blood or green matter  (bile) in your vomit.  You have blood in your stool. This may cause stool to look black and tarry.  You have not urinated in 6-8 hours.  You faint.  Your heart rate while sitting still is over 100 beats a minute.  You have trouble breathing. This information is not intended to replace advice given to you by your health care provider. Make sure you discuss any questions you have with your health care provider. Document Released: 06/03/2005 Document Revised: 12/29/2015 Document Reviewed: 07/28/2015 Elsevier Interactive Patient Education  2017 [employer] Inc.  

## 2016-07-29 NOTE — Progress Notes (Signed)
RN visit only. 

## 2016-08-08 ENCOUNTER — Encounter: Payer: Self-pay | Admitting: Hematology and Oncology

## 2016-08-08 ENCOUNTER — Ambulatory Visit (HOSPITAL_BASED_OUTPATIENT_CLINIC_OR_DEPARTMENT_OTHER): Payer: Medicare Other

## 2016-08-08 ENCOUNTER — Ambulatory Visit (HOSPITAL_BASED_OUTPATIENT_CLINIC_OR_DEPARTMENT_OTHER): Payer: Medicare Other | Admitting: Hematology and Oncology

## 2016-08-08 ENCOUNTER — Ambulatory Visit: Payer: Medicare Other

## 2016-08-08 ENCOUNTER — Other Ambulatory Visit (HOSPITAL_BASED_OUTPATIENT_CLINIC_OR_DEPARTMENT_OTHER): Payer: Medicare Other

## 2016-08-08 ENCOUNTER — Telehealth: Payer: Self-pay | Admitting: Hematology and Oncology

## 2016-08-08 DIAGNOSIS — C541 Malignant neoplasm of endometrium: Secondary | ICD-10-CM

## 2016-08-08 DIAGNOSIS — E440 Moderate protein-calorie malnutrition: Secondary | ICD-10-CM | POA: Diagnosis not present

## 2016-08-08 DIAGNOSIS — T451X5A Adverse effect of antineoplastic and immunosuppressive drugs, initial encounter: Secondary | ICD-10-CM

## 2016-08-08 DIAGNOSIS — G893 Neoplasm related pain (acute) (chronic): Secondary | ICD-10-CM

## 2016-08-08 DIAGNOSIS — C7801 Secondary malignant neoplasm of right lung: Secondary | ICD-10-CM | POA: Diagnosis not present

## 2016-08-08 DIAGNOSIS — D6481 Anemia due to antineoplastic chemotherapy: Secondary | ICD-10-CM | POA: Diagnosis not present

## 2016-08-08 DIAGNOSIS — K1231 Oral mucositis (ulcerative) due to antineoplastic therapy: Secondary | ICD-10-CM | POA: Diagnosis not present

## 2016-08-08 DIAGNOSIS — C782 Secondary malignant neoplasm of pleura: Secondary | ICD-10-CM | POA: Diagnosis not present

## 2016-08-08 DIAGNOSIS — C7802 Secondary malignant neoplasm of left lung: Secondary | ICD-10-CM | POA: Diagnosis not present

## 2016-08-08 DIAGNOSIS — Z5111 Encounter for antineoplastic chemotherapy: Secondary | ICD-10-CM | POA: Diagnosis not present

## 2016-08-08 DIAGNOSIS — E860 Dehydration: Secondary | ICD-10-CM

## 2016-08-08 LAB — CBC WITH DIFFERENTIAL/PLATELET
BASO%: 0.6 % (ref 0.0–2.0)
BASOS ABS: 0 10*3/uL (ref 0.0–0.1)
EOS%: 0.9 % (ref 0.0–7.0)
Eosinophils Absolute: 0 10*3/uL (ref 0.0–0.5)
HEMATOCRIT: 30.2 % — AB (ref 34.8–46.6)
HGB: 9.8 g/dL — ABNORMAL LOW (ref 11.6–15.9)
LYMPH%: 29.9 % (ref 14.0–49.7)
MCH: 33.1 pg (ref 25.1–34.0)
MCHC: 32.5 g/dL (ref 31.5–36.0)
MCV: 102 fL — ABNORMAL HIGH (ref 79.5–101.0)
MONO#: 0.5 10*3/uL (ref 0.1–0.9)
MONO%: 11.1 % (ref 0.0–14.0)
NEUT#: 2.7 10*3/uL (ref 1.5–6.5)
NEUT%: 57.5 % (ref 38.4–76.8)
Platelets: 315 10*3/uL (ref 145–400)
RBC: 2.96 10*6/uL — AB (ref 3.70–5.45)
RDW: 15.7 % — ABNORMAL HIGH (ref 11.2–14.5)
WBC: 4.7 10*3/uL (ref 3.9–10.3)
lymph#: 1.4 10*3/uL (ref 0.9–3.3)

## 2016-08-08 LAB — COMPREHENSIVE METABOLIC PANEL
ALT: 58 U/L — AB (ref 0–55)
ANION GAP: 8 meq/L (ref 3–11)
AST: 31 U/L (ref 5–34)
Albumin: 2.6 g/dL — ABNORMAL LOW (ref 3.5–5.0)
Alkaline Phosphatase: 177 U/L — ABNORMAL HIGH (ref 40–150)
BUN: 8.9 mg/dL (ref 7.0–26.0)
CALCIUM: 9.8 mg/dL (ref 8.4–10.4)
CHLORIDE: 103 meq/L (ref 98–109)
CO2: 28 meq/L (ref 22–29)
CREATININE: 0.7 mg/dL (ref 0.6–1.1)
EGFR: >90 mL/min/{1.73_m2} (ref >90)
Glucose: 79 mg/dl (ref 70–140)
POTASSIUM: 4.2 meq/L (ref 3.5–5.1)
Sodium: 139 mEq/L (ref 136–145)
Total Bilirubin: <0.22 mg/dL (ref 0.20–1.20)
Total Protein: 8.6 g/dL — ABNORMAL HIGH (ref 6.4–8.3)

## 2016-08-08 MED ORDER — ONDANSETRON HCL 4 MG/2ML IJ SOLN
8.0000 mg | Freq: Once | INTRAMUSCULAR | Status: AC
  Administered 2016-08-08: 8 mg via INTRAVENOUS

## 2016-08-08 MED ORDER — SODIUM CHLORIDE 0.9% FLUSH
10.0000 mL | Freq: Once | INTRAVENOUS | Status: AC
  Administered 2016-08-08: 10 mL
  Filled 2016-08-08: qty 10

## 2016-08-08 MED ORDER — ONDANSETRON HCL 4 MG/2ML IJ SOLN
INTRAMUSCULAR | Status: AC
  Filled 2016-08-08: qty 2

## 2016-08-08 MED ORDER — DOXORUBICIN HCL LIPOSOMAL CHEMO INJECTION 2 MG/ML
28.0000 mg/m2 | Freq: Once | INTRAVENOUS | Status: AC
  Administered 2016-08-08: 50 mg via INTRAVENOUS
  Filled 2016-08-08: qty 25

## 2016-08-08 MED ORDER — ONDANSETRON HCL 8 MG PO TABS
ORAL_TABLET | ORAL | Status: AC
  Filled 2016-08-08: qty 1

## 2016-08-08 MED ORDER — DEXTROSE 5 % IV SOLN
Freq: Once | INTRAVENOUS | Status: AC
  Administered 2016-08-08: 13:00:00 via INTRAVENOUS

## 2016-08-08 MED ORDER — DEXAMETHASONE SODIUM PHOSPHATE 10 MG/ML IJ SOLN
INTRAMUSCULAR | Status: AC
  Filled 2016-08-08: qty 1

## 2016-08-08 MED ORDER — HEPARIN SOD (PORK) LOCK FLUSH 100 UNIT/ML IV SOLN
500.0000 [IU] | Freq: Once | INTRAVENOUS | Status: AC | PRN
  Administered 2016-08-08: 500 [IU]
  Filled 2016-08-08: qty 5

## 2016-08-08 MED ORDER — SODIUM CHLORIDE 0.9% FLUSH
10.0000 mL | INTRAVENOUS | Status: DC | PRN
  Administered 2016-08-08: 10 mL
  Filled 2016-08-08: qty 10

## 2016-08-08 MED ORDER — SODIUM CHLORIDE 0.9 % IV SOLN
Freq: Once | INTRAVENOUS | Status: DC
  Administered 2016-08-08: 13:00:00 via INTRAVENOUS

## 2016-08-08 MED ORDER — DEXAMETHASONE SODIUM PHOSPHATE 10 MG/ML IJ SOLN
10.0000 mg | Freq: Once | INTRAMUSCULAR | Status: AC
Start: 2016-08-08 — End: 2016-08-08
  Administered 2016-08-08: 10 mg via INTRAVENOUS

## 2016-08-08 NOTE — Progress Notes (Signed)
[facility] OFFICE PROGRESS NOTE  Patient Care Team: Ardith Dark, PA-C as PCP - General (Physician Assistant)  SUMMARY OF ONCOLOGIC HISTORY:   Endometrial cancer ([place])   03/07/2014 Imaging    CT scan showed large uterine mass      03/07/2014 Initial Diagnosis    Surgery by Dr Jacquelyne Balint at [facility] in [place] on 04-06-14 TAH, BSO, omentectomy, pelvic and right common iliac node evaluation. 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 Bulk of the carcinoma was in lower uterine segment where it was within 0.1 cm of serosal surface. Cytology positive for adenocarcinoma on washings [id number] Surgical findings were significant for no paraaortic adenopathy apparent. Patient received 3 cycles of adjuvant chemotherapy in [place] by Dr Sabra Heck from11-19-15 thru2-12-16, with neulasta      08/15/2014 Imaging    Interval hysterectomy. 2. Retroperitoneal low-density structures which are suspicious for necrotic adenopathy/metastasis. Left periaortic component is slightly decreased in size. However, there is new soft tissue thickening about the abdominal aorta more superiorly. Less likely differential considerations include benign lymphangioma/lymphangiomas and/or concurrent vasculitis. Consider PET for further evaluation. 3. Development of bilateral pleural effusions with left base atelectasis. 4. Indeterminate bibasilar pulmonary nodules, felt to be similar. Consider further evaluation with chest CT, as recommended on 03/25/2014. 5.  Possible constipation.      08/24/2014 - 10/21/2014 Chemotherapy    She received 2 cycles of carbo/taxol. Cycle 3 with [place] only       11/10/2014  Imaging    Prior periaortic lucency of concern at the hiatus has resolved and was probably transient and inflammatory or due to third spacing of fluid. The pericardial effusion and pleural effusions have also resolved. 2. Upper normal sized lymph node just above the right inferior pulmonary vein is new compared to the prior exam and may merit Observation. 3. Tiny scattered pulmonary nodules remain stable. 4.  Prominent stool throughout the colon favors constipation. 5.  Aortoiliac atherosclerotic vascular disease. 6. Degenerative arthropathy of the hips and degenerative disc disease in the lumbar spine. 7. Small ventral supraumbilical hernia contains adipose tissue. 8. Emphysema. 9. Left anterior descending coronary artery atherosclerosis. 10. Ascending thoracic aortic aneurysm.       12/25/2014 Imaging    No acute findings within the abdomen or pelvis. No evidence of recurrent or metastatic carcinoma. Large stool burden again demonstrated; suggest clinical correlation for possible constipation.       01/03/2015 - 01/26/2015 Radiation Therapy    July 19, July 27, July 29, August 9, August 11 Site/dose: Proximal vagina, 30 gray in 5 fractions      11/30/2015 Imaging    Significant progression of disease, as detailed above. This includes numerous pulmonary masses/nodules within the right lung, largest of which is at the right lung base measuring 3.1 x 2.6 cm. A new pleural based mass is now seen along the medial aspects of the left upper lobe measuring 2.6 x 1.7 cm. On earlier chest CT of 11/04/2014, the largest pulmonary nodule identified within the left lung was 6 mm and the largest pulmonary nodule identified within the right lung was 4 mm. 2. Mediastinal and perihilar lymphadenopathy, most numerous within the left lower paratracheal and aortopulmonary window regions, with largest lymph  nodes in the right perihilar region demonstrating evidence of central necrosis, almost certainly compatible with  metastatic lymphadenopathy throughout and further evidence of progression of disease. 3. Large left pleural effusion, likely malignant effusion, with adjacent compressive atelectasis. Heart is displaced to the right by the large left pleural effusion. 4. Emphysematous change, upper lobe predominant. 5. No definite evidence of osseous metastasis. Scattered tiny lucent foci within the thoracic vertebral bodies could conceivably represent early developing metastases.      11/30/2015 - 12/06/2015 Hospital Admission    She was admitted for severe shortness of breath and was found to have malignant effusion      12/01/2015 Imaging    As seen on yesterday's chest CT, there is metastatic disease to both lung bases with a large malignant left pleural effusion. There are several pleural-based metastases on the left and right infrahilar lymphadenopathy, incompletely visualized. 2. Left periaortic and left pelvic side wall lymphadenopathy consistent with metastatic disease. No other evidence of abdominal  pelvic metastatic disease. 3. No hydronephrosis.      12/04/2015 Procedure    Successful placement of a tunneled left pleural catheter with ultrasound and fluoroscopic guidance.       12/22/2015 Pathology Results    Pleura, peel, Left pleural peel - RARE MALIGNANT CELLS. - SEE MICROSCOPIC DESCRIPTION Microscopic Comment The specimen consists of abundant fibrin with rare minute aggregates of epithelial cells with malignant features consistent with poorly differentiated carcinoma.       12/22/2015 - 12/26/2015 Hospital Admission    She was admitted for management of malignant pleural effusion      12/22/2015 Surgery    OPERATION:  Left VATS (video-assisted thoracoscopic surgery) with drainage of loculated malignant effusion, chemical pleurodesis with doxycycline, placement of PleurX catheter.  PREOPERATIVE DIAGNOSES:  Loculated left malignant effusion, history of endometrial carcinoma, advanced  age.  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      01/04/2016 - 05/16/2016 Chemotherapy    She received carboplatin & Taxol       03/19/2016 Imaging    Interval response to therapy. 2. Decrease in volume of left pleural effusion. The index lesions within the chest have decreased in size in the interval. 3. Interval decrease in size of retroperitoneal and left pelvic side wall adenopathy. 4. Aortic atherosclerosis.       06/21/2016 Imaging    Apparent interval mixed response to therapy. Pleural disease in the chest has progressed along with progression of mediastinal lymphadenopathy. Some pulmonary nodules progressed while others have decreased in size in the interval. The index abdominal retroperitoneal left pelvic sidewall lymph nodes have decreased in the interval. 2. Persistent small left pleural effusion with posterior left base collapse/consolidation. 3.  Abdominal Aortic Atherosclerois       07/10/2016 Imaging    Left ventricle: The cavity size was normal. Wall thickness was increased in a pattern of mild LVH. Systolic function was normal. The estimated ejection fraction was in the range of 60% to 65%. Wall motion was normal; there were no regional wall motion abnormalities. Doppler parameters are consistent with abnormal left ventricular relaxation (grade 1 diastolic dysfunction).       07/11/2016 -  Chemotherapy    She received Doxil only       07/25/2016 Adverse Reaction    She felt dehydrated, dizzy, had mucositis, constipated and miserable       INTERVAL HISTORY: Please see below for problem oriented charting. She  is seen prior to cycle 2 of treatment. Cycle 2 was delayed due to poor tolerance to chemotherapy. She had recent mucositis, resolving. Appetite is slowly improving with mirtazapine.  She has not lost any weight since I saw her. She complained of some mild  anxiety but denies depression. She declined anxiolytics. She complained of intermittent chest wall discomfort.  She has not taken tramadol as prescribed. Denies recent shortness of breath, dizziness or leg swelling   REVIEW OF SYSTEMS:   Constitutional: Denies fevers, chills or abnormal weight loss Eyes: Denies blurriness of vision Respiratory: Denies cough, dyspnea or wheezes Cardiovascular: Denies palpitation, chest discomfort or lower extremity swelling Gastrointestinal:  Denies nausea, heartburn or change in bowel habits Skin: Denies abnormal skin rashes Lymphatics: Denies new lymphadenopathy or easy bruising Neurological:Denies numbness, tingling or new weaknesses All other systems were reviewed with the patient and are negative.  I have reviewed the past medical history, past surgical history, social history and family history with the patient and they are unchanged from previous note.  ALLERGIES:  is allergic to carboplatin and levaquin [levofloxacin in d5w].  MEDICATIONS:  Current Outpatient Prescriptions  Medication Sig Dispense Refill  . Calcium Citrate-Vitamin D (CALCIUM + D PO) Take 1 tablet by mouth daily.    . cholecalciferol (VITAMIN D) 1000 units tablet Take 1,000 Units by mouth daily.    . Cyanocobalamin (VITAMIN B-12 PO) Take 1 tablet by mouth daily.    . ferrous sulfate 325 (65 FE) MG tablet Take 325 mg by mouth daily.    . fludrocortisone (FLORINEF) 0.1 MG tablet Take 1 tablet (0.1 mg total) by mouth daily. 30 tablet 2  . loratadine (CLARITIN) 10 MG tablet Take 10 mg by mouth daily as needed for allergies.    . mirtazapine (REMERON) 15 MG tablet Take 1 tablet (15 mg total) by mouth at bedtime. 30 tablet 1  . Multiple Vitamin (MULTI-VITAMINS) TABS Take 1 tablet by mouth daily.    . naproxen sodium (ANAPROX) 220 MG tablet Take 220 mg by mouth 2 (two) times daily as needed.    . polyethylene glycol (MIRALAX / GLYCOLAX) packet Take 17 g by mouth daily as needed for  mild constipation.     Marland Kitchen PROAIR HFA 108 (90 Base) MCG/ACT inhaler INHALE 1 PUFF BY MOUTH FOUR TIMES DAILY 8.5 g 0  . promethazine (PHENERGAN) 12.5 MG tablet Take 1 tablet (12.5 mg total) by mouth every 6 (six) hours as needed for nausea or vomiting. 30 tablet 0  . traMADol (ULTRAM) 50 MG tablet Take 1 tablet (50 mg total) by mouth every 6 (six) hours as needed. 30 tablet 0   No current facility-administered medications for this visit.    Facility-Administered Medications Ordered in Other Visits  Medication Dose Route Frequency Provider Last Rate Last Dose  . sodium chloride 0.9 % injection 10 mL  10 mL Intravenous PRN Lennis Marion Downer, MD   10 mL at 02/15/16 1601    PHYSICAL EXAMINATION: ECOG PERFORMANCE STATUS: 2 - Symptomatic, <50% confined to bed  Vitals:   08/08/16 1054  BP: 91/72  Pulse: 62  Resp: 18  Temp: 98.4 F (36.9 C)   Filed Weights   08/08/16 1054  Weight: 142 lb 4.8 oz (64.5 kg)    GENERAL:alert, no distress and comfortable.  She looks thin and frail SKIN: skin color, texture, turgor are normal, no rashes or significant lesions EYES: normal, Conjunctiva are pink and non-injected, sclera clear OROPHARYNX:no exudate, no erythema and lips, buccal mucosa,  and tongue normal  NECK: supple, thyroid normal size, non-tender, without nodularity LYMPH:  no palpable lymphadenopathy in the cervical, axillary or inguinal LUNGS: clear to auscultation and percussion with normal breathing effort HEART: regular rate & rhythm and no murmurs and no lower extremity edema ABDOMEN:abdomen soft, non-tender and normal bowel sounds Musculoskeletal:no cyanosis of digits and no clubbing  NEURO: alert & oriented x 3 with fluent speech, no focal motor/sensory deficits  LABORATORY DATA:  I have reviewed the data as listed    Component Value Date/Time   NA 139 08/08/2016 1027   K 4.2 08/08/2016 1027   CL 101 12/24/2015 0639   CO2 28 08/08/2016 1027   GLUCOSE 79 08/08/2016 1027   BUN  8.9 08/08/2016 1027   CREATININE 0.7 08/08/2016 1027   CALCIUM 9.8 08/08/2016 1027   PROT 8.6 (H) 08/08/2016 1027   ALBUMIN 2.6 (L) 08/08/2016 1027   AST 31 08/08/2016 1027   ALT 58 (H) 08/08/2016 1027   ALKPHOS 177 (H) 08/08/2016 1027   BILITOT <0.22 08/08/2016 1027   GFRNONAA >60 12/24/2015 0639   GFRAA >60 12/24/2015 0639    No results found for: SPEP, UPEP  Lab Results  Component Value Date   WBC 4.7 08/08/2016   NEUTROABS 2.7 08/08/2016   HGB 9.8 (L) 08/08/2016   HCT 30.2 (L) 08/08/2016   MCV 102.0 (H) 08/08/2016   PLT 315 08/08/2016      Chemistry      Component Value Date/Time   NA 139 08/08/2016 1027   K 4.2 08/08/2016 1027   CL 101 12/24/2015 0639   CO2 28 08/08/2016 1027   BUN 8.9 08/08/2016 1027   CREATININE 0.7 08/08/2016 1027      Component Value Date/Time   CALCIUM 9.8 08/08/2016 1027   ALKPHOS 177 (H) 08/08/2016 1027   AST 31 08/08/2016 1027   ALT 58 (H) 08/08/2016 1027   BILITOT <0.22 08/08/2016 1027      ASSESSMENT & PLAN:  Endometrial cancer ([place]) She tolerated treatment very poorly. I am concerned. I plan to reduce a dose of chemotherapy by 25%. I will also add additional IV fluid hydration today due to clinical hypotension and dehydration. I plan to repeat imaging study after 3 cycles of treatment to document response.  Antineoplastic chemotherapy induced anemia This is likely due to recent treatment. The patient denies recent history of bleeding such as epistaxis, hematuria or hematochezia. She is asymptomatic from the anemia. I will observe for now.    Moderate malnutrition ([place]) She has recent malignant cachexia with poor oral intake. Her weight is stable. Serum albumin remains low. She was started on mirtazapine recently and she felt that appetite has mildly improved. I recommend she increase oral intake as tolerated      Dehydration Her blood pressure is low and she appears clinically dehydrated. We will proceed with  treatment with additional IV fluid hydration.  Mucositis due to antineoplastic therapy She had recent mucositis from treatments. It is resolving. Recommend reduce dose treatment as above.  Cancer associated pain She complained of chest wall discomfort. I suspect this is due to cancer pain. She declined pain medicine. I recommend she takes tramadol as needed   No orders of the defined types were placed in this encounter.  All questions were answered. The patient knows to call the clinic with any problems, questions or concerns. No barriers to learning was detected. I spent 30 minutes counseling the patient face to face. The total time spent in  the appointment was 40 minutes and more than 50% was on counseling and review of test results     Heath Lark, MD 08/08/2016 11:44 AM

## 2016-08-08 NOTE — Assessment & Plan Note (Signed)
She has recent malignant cachexia with poor oral intake. Her weight is stable. Serum albumin remains low. She was started on mirtazapine recently and she felt that appetite has mildly improved. I recommend she increase oral intake as tolerated

## 2016-08-08 NOTE — Patient Instructions (Signed)
[facility] Discharge Instructions for Patients Receiving Chemotherapy  Today you received the following chemotherapy agents:  Doxil ( doxorubicin HCL liposomal)  To help prevent nausea and vomiting after your treatment, we encourage you to take your nausea medication as prescribed.   If you develop nausea and vomiting that is not controlled by your nausea medication, call the clinic.   BELOW ARE SYMPTOMS THAT SHOULD BE REPORTED IMMEDIATELY:  *FEVER GREATER THAN 100.5 F  *CHILLS WITH OR WITHOUT FEVER  NAUSEA AND VOMITING THAT IS NOT CONTROLLED WITH YOUR NAUSEA MEDICATION  *UNUSUAL SHORTNESS OF BREATH  *UNUSUAL BRUISING OR BLEEDING  TENDERNESS IN MOUTH AND THROAT WITH OR WITHOUT PRESENCE OF ULCERS  *URINARY PROBLEMS  *BOWEL PROBLEMS  UNUSUAL RASH Items with * indicate a potential emergency and should be followed up as soon as possible.  Feel free to call the clinic you have any questions or concerns. The clinic phone number is [phone].  Please show the [facility] at check-in to the Emergency Department and triage nurse.

## 2016-08-08 NOTE — Assessment & Plan Note (Signed)
She had recent mucositis from treatments. It is resolving. Recommend reduce dose treatment as above.

## 2016-08-08 NOTE — Assessment & Plan Note (Signed)
This is likely due to recent treatment. The patient denies recent history of bleeding such as epistaxis, hematuria or hematochezia. She is asymptomatic from the anemia. I will observe for now.   

## 2016-08-08 NOTE — Assessment & Plan Note (Signed)
She tolerated treatment very poorly. I am concerned. I plan to reduce a dose of chemotherapy by 25%. I will also add additional IV fluid hydration today due to clinical hypotension and dehydration. I plan to repeat imaging study after 3 cycles of treatment to document response.

## 2016-08-08 NOTE — Telephone Encounter (Signed)
Appointments scheduled per 2/22 LOS. Patient given AVS report and calendars with future scheduled appointments. °

## 2016-08-08 NOTE — Addendum Note (Signed)
Addended by: Shari Heritage on: 08/08/2016 10:56 AM   Modules accepted: Miquel Dunn

## 2016-08-08 NOTE — Assessment & Plan Note (Signed)
Her blood pressure is low and she appears clinically dehydrated. We will proceed with treatment with additional IV fluid hydration. 

## 2016-08-08 NOTE — Assessment & Plan Note (Signed)
She complained of chest wall discomfort. I suspect this is due to cancer pain. She declined pain medicine. I recommend she takes tramadol as needed 

## 2016-08-28 ENCOUNTER — Other Ambulatory Visit: Payer: Self-pay | Admitting: Hematology and Oncology

## 2016-08-28 ENCOUNTER — Telehealth: Payer: Self-pay | Admitting: *Deleted

## 2016-08-28 ENCOUNTER — Other Ambulatory Visit: Payer: Self-pay | Admitting: *Deleted

## 2016-08-28 DIAGNOSIS — C7800 Secondary malignant neoplasm of unspecified lung: Secondary | ICD-10-CM

## 2016-08-28 DIAGNOSIS — C541 Malignant neoplasm of endometrium: Secondary | ICD-10-CM

## 2016-08-28 MED ORDER — MIRTAZAPINE 15 MG PO TABS: 15.0000 mg | ORAL_TABLET | Freq: Every day | ORAL | 1 refills | Status: DC

## 2016-08-28 NOTE — Telephone Encounter (Signed)
Notified patient of CT scan to be done 3/19 @ 2:00 at [facility]. NPO 4 hours prior to scan. Will need to drink 1 bottle of contrast @ 1200, and 2nd bottle at 1:00pm. Verbalized understanding. Knows that she needs to pick up contrast before Monday or come in at 1200 on Monday to pick up bottles.

## 2016-08-28 NOTE — Telephone Encounter (Signed)
She was dehydrated recently. Can you call and see if she feels like she needs IVF? Also, I have placed order to repeat CT scan in 1 week before she sees me I will alert Kelly the CT is somewhat urgent

## 2016-08-28 NOTE — Telephone Encounter (Signed)
Pt states she is drinking at least 6 glasses of fluid per day, drinks 1-2 boost per day and is eating "ok".  No fluids needed  Notified of pending CT

## 2016-08-28 NOTE — Telephone Encounter (Signed)
Patient called wanting to inform MD Alvy Bimler about her gradually increasing leg weakness. Patient states that it has gotten to the point that she has to have a walker.

## 2016-08-29 ENCOUNTER — Telehealth: Payer: Self-pay | Admitting: *Deleted

## 2016-08-29 NOTE — Telephone Encounter (Signed)
"  I need to talk with Dr.Gorsuch's nurse about contrast for Monday's scans.  My sister is on her way.  The nurse said she will place the contrast bottles at front desk with Ms. Wilma."   We'll be glad to provide contrast.  Advised they ask whoever is at the first desk in [facility] lobby for contrast when they arrive.  No further questions.

## 2016-08-30 ENCOUNTER — Other Ambulatory Visit: Payer: Self-pay | Admitting: *Deleted

## 2016-08-30 DIAGNOSIS — C541 Malignant neoplasm of endometrium: Secondary | ICD-10-CM

## 2016-08-30 MED ORDER — FLUDROCORTISONE ACETATE 0.1 MG PO TABS: 0.1000 mg | ORAL_TABLET | Freq: Every day | ORAL | 3 refills | Status: DC

## 2016-09-02 ENCOUNTER — Ambulatory Visit (HOSPITAL_COMMUNITY)
Admission: RE | Admit: 2016-09-02 | Discharge: 2016-09-02 | Disposition: A | Discharge status: Home / Self Care | Payer: Medicare Other | Source: Ambulatory Visit | Attending: Hematology and Oncology | Admitting: Hematology and Oncology

## 2016-09-02 DIAGNOSIS — R590 Localized enlarged lymph nodes: Secondary | ICD-10-CM | POA: Insufficient documentation

## 2016-09-02 DIAGNOSIS — C7802 Secondary malignant neoplasm of left lung: Secondary | ICD-10-CM | POA: Diagnosis not present

## 2016-09-02 DIAGNOSIS — J90 Pleural effusion, not elsewhere classified: Secondary | ICD-10-CM | POA: Insufficient documentation

## 2016-09-02 DIAGNOSIS — C541 Malignant neoplasm of endometrium: Secondary | ICD-10-CM | POA: Insufficient documentation

## 2016-09-02 DIAGNOSIS — C7801 Secondary malignant neoplasm of right lung: Secondary | ICD-10-CM | POA: Insufficient documentation

## 2016-09-02 DIAGNOSIS — C7800 Secondary malignant neoplasm of unspecified lung: Secondary | ICD-10-CM

## 2016-09-02 MED ORDER — IOPAMIDOL (ISOVUE-300) INJECTION 61%
INTRAVENOUS | Status: AC
  Administered 2016-09-02: 100 mL
  Filled 2016-09-02: qty 100

## 2016-09-04 ENCOUNTER — Telehealth: Payer: Self-pay

## 2016-09-04 NOTE — Telephone Encounter (Signed)
Patient called and left message that she would be unable to make appt tomorrow 3-22. She is canceling appt because she is too tired and weak. States she has to come on [place] bus. She states that she had CT scan Monday and is requesting that Dr. Alvy Bimler call with results. Appt canceled for tomorrow.

## 2016-09-04 NOTE — Telephone Encounter (Signed)
Ct scan showed worse disease control. We need to meet face to face to discuss treatment options I can see her back on Tuesday 4/3 in the morning PLease let me know if that would work and I will send scheduling msg

## 2016-09-04 NOTE — Telephone Encounter (Signed)
Given below message. She will take the 4-3 appt.

## 2016-09-05 ENCOUNTER — Ambulatory Visit: Payer: Medicare Other | Admitting: Hematology and Oncology

## 2016-09-05 ENCOUNTER — Other Ambulatory Visit: Payer: Medicare Other

## 2016-09-10 ENCOUNTER — Other Ambulatory Visit: Payer: Self-pay | Admitting: *Deleted

## 2016-09-10 DIAGNOSIS — C541 Malignant neoplasm of endometrium: Secondary | ICD-10-CM

## 2016-09-11 ENCOUNTER — Ambulatory Visit (HOSPITAL_BASED_OUTPATIENT_CLINIC_OR_DEPARTMENT_OTHER): Payer: Medicare Other | Admitting: Nurse Practitioner

## 2016-09-11 ENCOUNTER — Ambulatory Visit: Payer: Medicare Other

## 2016-09-11 ENCOUNTER — Other Ambulatory Visit (HOSPITAL_BASED_OUTPATIENT_CLINIC_OR_DEPARTMENT_OTHER): Payer: Medicare Other

## 2016-09-11 ENCOUNTER — Telehealth: Payer: Self-pay | Admitting: Hematology and Oncology

## 2016-09-11 ENCOUNTER — Ambulatory Visit (HOSPITAL_BASED_OUTPATIENT_CLINIC_OR_DEPARTMENT_OTHER): Payer: Medicare Other | Admitting: Hematology and Oncology

## 2016-09-11 ENCOUNTER — Encounter: Payer: Self-pay | Admitting: Hematology and Oncology

## 2016-09-11 VITALS — BP 102/66 | HR 82

## 2016-09-11 DIAGNOSIS — Z7189 Other specified counseling: Secondary | ICD-10-CM | POA: Insufficient documentation

## 2016-09-11 DIAGNOSIS — C541 Malignant neoplasm of endometrium: Secondary | ICD-10-CM

## 2016-09-11 DIAGNOSIS — D6481 Anemia due to antineoplastic chemotherapy: Secondary | ICD-10-CM | POA: Diagnosis not present

## 2016-09-11 DIAGNOSIS — R64 Cachexia: Secondary | ICD-10-CM | POA: Diagnosis not present

## 2016-09-11 DIAGNOSIS — C7802 Secondary malignant neoplasm of left lung: Secondary | ICD-10-CM

## 2016-09-11 DIAGNOSIS — I959 Hypotension, unspecified: Secondary | ICD-10-CM | POA: Diagnosis not present

## 2016-09-11 DIAGNOSIS — E860 Dehydration: Secondary | ICD-10-CM

## 2016-09-11 DIAGNOSIS — C7800 Secondary malignant neoplasm of unspecified lung: Secondary | ICD-10-CM

## 2016-09-11 DIAGNOSIS — C7801 Secondary malignant neoplasm of right lung: Secondary | ICD-10-CM | POA: Diagnosis not present

## 2016-09-11 DIAGNOSIS — T451X5A Adverse effect of antineoplastic and immunosuppressive drugs, initial encounter: Secondary | ICD-10-CM

## 2016-09-11 LAB — CBC WITH DIFFERENTIAL/PLATELET
BASO%: 0.4 % (ref 0.0–2.0)
BASOS ABS: 0 10*3/uL (ref 0.0–0.1)
EOS%: 0.4 % (ref 0.0–7.0)
Eosinophils Absolute: 0 10*3/uL (ref 0.0–0.5)
HEMATOCRIT: 27.9 % — AB (ref 34.8–46.6)
HEMOGLOBIN: 8.9 g/dL — AB (ref 11.6–15.9)
LYMPH#: 1.1 10*3/uL (ref 0.9–3.3)
LYMPH%: 20 % (ref 14.0–49.7)
MCH: 31.8 pg (ref 25.1–34.0)
MCHC: 31.9 g/dL (ref 31.5–36.0)
MCV: 99.6 fL (ref 79.5–101.0)
MONO#: 0.4 10*3/uL (ref 0.1–0.9)
MONO%: 8 % (ref 0.0–14.0)
NEUT#: 3.9 10*3/uL (ref 1.5–6.5)
NEUT%: 71.2 % (ref 38.4–76.8)
Platelets: 380 10*3/uL (ref 145–400)
RBC: 2.8 10*6/uL — ABNORMAL LOW (ref 3.70–5.45)
RDW: 17.1 % — AB (ref 11.2–14.5)
WBC: 5.5 10*3/uL (ref 3.9–10.3)

## 2016-09-11 LAB — COMPREHENSIVE METABOLIC PANEL
ALBUMIN: 2.5 g/dL — AB (ref 3.5–5.0)
ALT: 40 U/L (ref 0–55)
AST: 23 U/L (ref 5–34)
Alkaline Phosphatase: 217 U/L — ABNORMAL HIGH (ref 40–150)
Anion Gap: 11 mEq/L (ref 3–11)
BUN: 11.1 mg/dL (ref 7.0–26.0)
CHLORIDE: 101 meq/L (ref 98–109)
CO2: 24 mEq/L (ref 22–29)
CREATININE: 0.8 mg/dL (ref 0.6–1.1)
Calcium: 10.1 mg/dL (ref 8.4–10.4)
EGFR: >90 mL/min/{1.73_m2} (ref >90)
GLUCOSE: 187 mg/dL — AB (ref 70–140)
POTASSIUM: 4.1 meq/L (ref 3.5–5.1)
SODIUM: 136 meq/L (ref 136–145)
Total Bilirubin: 0.34 mg/dL (ref 0.20–1.20)
Total Protein: 8.9 g/dL — ABNORMAL HIGH (ref 6.4–8.3)

## 2016-09-11 MED ORDER — LORAZEPAM 0.5 MG PO TABS: 0.5000 mg | ORAL_TABLET | Freq: Two times a day (BID) | ORAL | 0 refills | Status: AC | PRN

## 2016-09-11 MED ORDER — HEPARIN SOD (PORK) LOCK FLUSH 100 UNIT/ML IV SOLN
500.0000 [IU] | Freq: Once | INTRAVENOUS | Status: AC | PRN
Start: 2016-09-11 — End: 2016-09-11
  Administered 2016-09-11: 500 [IU]
  Filled 2016-09-11: qty 5

## 2016-09-11 MED ORDER — SODIUM CHLORIDE 0.9 % IV SOLN
Freq: Once | INTRAVENOUS | Status: AC
  Administered 2016-09-11: 11:00:00 via INTRAVENOUS

## 2016-09-11 MED ORDER — HEPARIN SOD (PORK) LOCK FLUSH 100 UNIT/ML IV SOLN
500.0000 [IU] | Freq: Once | INTRAVENOUS | Status: AC
  Administered 2016-09-11: 500 [IU]
  Filled 2016-09-11: qty 5

## 2016-09-11 MED ORDER — SODIUM CHLORIDE 0.9% FLUSH
10.0000 mL | INTRAVENOUS | Status: DC | PRN
  Administered 2016-09-11: 10 mL
  Filled 2016-09-11: qty 10

## 2016-09-11 MED ORDER — SODIUM CHLORIDE 0.9 % IJ SOLN
10.0000 mL | Freq: Once | INTRAMUSCULAR | Status: AC
  Administered 2016-09-11: 10 mL
  Filled 2016-09-11: qty 10

## 2016-09-11 NOTE — Assessment & Plan Note (Signed)
This is likely due to recent treatment. The patient denies recent history of bleeding such as epistaxis, hematuria or hematochezia. She is asymptomatic from the anemia. I will observe for now.   

## 2016-09-11 NOTE — Progress Notes (Signed)
RN visit for IV fluids only 

## 2016-09-11 NOTE — Assessment & Plan Note (Signed)
The patient is aware she has incurable disease and treatment is strictly palliative. We discussed importance of Advanced Directives and Living will. Her daughter is her medical power of attorney

## 2016-09-11 NOTE — Assessment & Plan Note (Signed)
She has pleural-based disease and that has cause significant discomfort She denies pain. I recommend conservative management

## 2016-09-11 NOTE — Assessment & Plan Note (Addendum)
The patient tolerated treatment very poorly. Unfortunately, CT scan and tumor markers showed disease progression. She is very frail She desire further palliative chemotherapy We reviewed current guidelines with her Treatment goal is strictly palliative She denies significant peripheral neuropathy from prior treatment I recommend weekly Abraxane, on days 1 and 8 and rest day 15 for cycles of every 21 days Some of the expected risks, benefits, side effects of chemotherapy including pancytopenia, peripheral neuropathy, risk of infection, etc. were discussed with the patient and she wants to proceed I plan to see her back before the first day of treatment for further supportive care With her frail status, I will give her 25% dose adjustment In the meantime, I would give her some IV fluid hydration and antiemetics to take as needed

## 2016-09-11 NOTE — Patient Instructions (Signed)
Dehydration, Adult Dehydration is a condition in which there is not enough fluid or water in the body. This happens when you lose more fluids than you take in. Important organs, such as the kidneys, brain, and heart, cannot function without a proper amount of fluids. Any loss of fluids from the body can lead to dehydration. Dehydration can range from mild to severe. This condition should be treated right away to prevent it from becoming severe. What are the causes? This condition may be caused by:  Vomiting.  Diarrhea.  Excessive sweating, such as from heat exposure or exercise.  Not drinking enough fluid, especially:  When ill.  While doing activity that requires a lot of energy.  Excessive urination.  Fever.  Infection.  Certain medicines, such as medicines that cause the body to lose excess fluid (diuretics).  Inability to access safe drinking water.  Reduced physical ability to get adequate water and food. What increases the risk? This condition is more likely to develop in people:  Who have a poorly controlled long-term (chronic) illness, such as diabetes, heart disease, or kidney disease.  Who are age 65 or older.  Who are disabled.  Who live in a place with high altitude.  Who play endurance sports. What are the signs or symptoms? Symptoms of mild dehydration may include:   Thirst.  Dry lips.  Slightly dry mouth.  Dry, warm skin.  Dizziness. Symptoms of moderate dehydration may include:   Very dry mouth.  Muscle cramps.  Dark urine. Urine may be the color of tea.  Decreased urine production.  Decreased tear production.  Heartbeat that is irregular or faster than normal (palpitations).  Headache.  Light-headedness, especially when you stand up from a sitting position.  Fainting (syncope). Symptoms of severe dehydration may include:   Changes in skin, such as:  Cold and clammy skin.  Blotchy (mottled) or pale skin.  Skin that does  not quickly return to normal after being lightly pinched and released (poor skin turgor).  Changes in body fluids, such as:  Extreme thirst.  No tear production.  Inability to sweat when body temperature is high, such as in hot weather.  Very little urine production.  Changes in vital signs, such as:  Weak pulse.  Pulse that is more than 100 beats a minute when sitting still.  Rapid breathing.  Low blood pressure.  Other changes, such as:  Sunken eyes.  Cold hands and feet.  Confusion.  Lack of energy (lethargy).  Difficulty waking up from sleep.  Short-term weight loss.  Unconsciousness. How is this diagnosed? This condition is diagnosed based on your symptoms and a physical exam. Blood and urine tests may be done to help confirm the diagnosis. How is this treated? Treatment for this condition depends on the severity. Mild or moderate dehydration can often be treated at home. Treatment should be started right away. Do not wait until dehydration becomes severe. Severe dehydration is an emergency and it needs to be treated in a hospital. Treatment for mild dehydration may include:   Drinking more fluids.  Replacing salts and minerals in your blood (electrolytes) that you may have lost. Treatment for moderate dehydration may include:   Drinking an oral rehydration solution (ORS). This is a drink that helps you replace fluids and electrolytes (rehydrate). It can be found at pharmacies and retail stores. Treatment for severe dehydration may include:   Receiving fluids through an IV tube.  Receiving an electrolyte solution through a feeding tube that is   passed through your nose and into your stomach (nasogastric tube, or NG tube).  Correcting any abnormalities in electrolytes.  Treating the underlying cause of dehydration. Follow these instructions at home:  If directed by your health care provider, drink an ORS:  Make an ORS by following instructions on the  package.  Start by drinking small amounts, about  cup (120 mL) every 5-10 minutes.  Slowly increase how much you drink until you have taken the amount recommended by your health care provider.  Drink enough clear fluid to keep your urine clear or pale yellow. If you were told to drink an ORS, finish the ORS first, then start slowly drinking other clear fluids. Drink fluids such as:  Water. Do not drink only water. Doing that can lead to having too little salt (sodium) in the body (hyponatremia).  Ice chips.  Fruit juice that you have added water to (diluted fruit juice).  Low-calorie sports drinks.  Avoid:  Alcohol.  Drinks that contain a lot of sugar. These include high-calorie sports drinks, fruit juice that is not diluted, and soda.  Caffeine.  Foods that are greasy or contain a lot of fat or sugar.  Take over-the-counter and prescription medicines only as told by your health care provider.  Do not take sodium tablets. This can lead to having too much sodium in the body (hypernatremia).  Eat foods that contain a healthy balance of electrolytes, such as bananas, oranges, potatoes, tomatoes, and spinach.  Keep all follow-up visits as told by your health care provider. This is important. Contact a health care provider if:  You have abdominal pain that:  Gets worse.  Stays in one area (localizes).  You have a rash.  You have a stiff neck.  You are more irritable than usual.  You are sleepier or more difficult to wake up than usual.  You feel weak or dizzy.  You feel very thirsty.  You have urinated only a small amount of very dark urine over 6-8 hours. Get help right away if:  You have symptoms of severe dehydration.  You cannot drink fluids without vomiting.  Your symptoms get worse with treatment.  You have a fever.  You have a severe headache.  You have vomiting or diarrhea that:  Gets worse.  Does not go away.  You have blood or green matter  (bile) in your vomit.  You have blood in your stool. This may cause stool to look black and tarry.  You have not urinated in 6-8 hours.  You faint.  Your heart rate while sitting still is over 100 beats a minute.  You have trouble breathing. This information is not intended to replace advice given to you by your health care provider. Make sure you discuss any questions you have with your health care provider. Document Released: 06/03/2005 Document Revised: 12/29/2015 Document Reviewed: 07/28/2015 Elsevier Interactive Patient Education  2017 [employer] Inc.  

## 2016-09-11 NOTE — Assessment & Plan Note (Signed)
Her blood pressure is low and she appears clinically dehydrated. We will proceed with treatment with additional IV fluid hydration.

## 2016-09-11 NOTE — Progress Notes (Signed)
[place] OFFICE PROGRESS NOTE  Patient Care Team: Jacquelyne Balint, MD as PCP - General (Gynecologic Oncology)  SUMMARY OF ONCOLOGIC HISTORY:   Endometrial cancer [facility])   03/07/2014 Imaging    CT scan showed large uterine mass      03/07/2014 Initial Diagnosis    Surgery by Dr Jacquelyne Balint at [facility] in [place] on 04-06-14 TAH, BSO, omentectomy, pelvic and right common iliac node evaluation. 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 Bulk of the carcinoma was in lower uterine segment where it was within 0.1 cm of serosal surface. Cytology positive for adenocarcinoma on washings ([id number]). Surgical findings were significant for no paraaortic adenopathy apparent. Patient received 3 cycles of adjuvant chemotherapy in [place] by Dr Sabra Heck from11-19-15 thru2-12-16, with neulasta      08/15/2014 Imaging    Interval hysterectomy. 2. Retroperitoneal low-density structures which are suspicious for necrotic adenopathy/metastasis. Left periaortic component is slightly decreased in size. However, there is new soft tissue thickening about the abdominal aorta more superiorly. Less likely differential considerations include benign lymphangioma/lymphangiomas and/or concurrent vasculitis. Consider PET for further evaluation. 3. Development of bilateral pleural effusions with left base atelectasis. 4. Indeterminate bibasilar pulmonary nodules, felt to be similar. Consider further evaluation with chest CT, as recommended on 03/25/2014. 5.  Possible constipation.      08/24/2014 - 10/21/2014 Chemotherapy    She received 2 cycles of carbo/taxol. Cycle 3 with [place] only       11/10/2014  Imaging    Prior periaortic lucency of concern at the hiatus has resolved and was probably transient and inflammatory or due to third spacing of fluid. The pericardial effusion and pleural effusions have also resolved. 2. Upper normal sized lymph node just above the right inferior pulmonary vein is new compared to the prior exam and may merit Observation. 3. Tiny scattered pulmonary nodules remain stable. 4.  Prominent stool throughout the colon favors constipation. 5.  Aortoiliac atherosclerotic vascular disease. 6. Degenerative arthropathy of the hips and degenerative disc disease in the lumbar spine. 7. Small ventral supraumbilical hernia contains adipose tissue. 8. Emphysema. 9. Left anterior descending coronary artery atherosclerosis. 10. Ascending thoracic aortic aneurysm.       12/25/2014 Imaging    No acute findings within the abdomen or pelvis. No evidence of recurrent or metastatic carcinoma. Large stool burden again demonstrated; suggest clinical correlation for possible constipation.       01/03/2015 - 01/26/2015 Radiation Therapy    July 19, July 27, July 29, August 9, August 11 Site/dose: Proximal vagina, 30 gray in 5 fractions      11/30/2015 Imaging    Significant progression of disease, as detailed above. This includes numerous pulmonary masses/nodules within the right lung, largest of which is at the right lung base measuring 3.1 x 2.6 cm. A new pleural based mass is now seen along the medial aspects of the left upper lobe measuring 2.6 x 1.7 cm. On earlier chest CT of 11/04/2014, the largest pulmonary nodule identified within the left lung was 6 mm and the largest pulmonary nodule identified within the right lung was 4 mm. 2. Mediastinal and perihilar lymphadenopathy, most numerous within the left lower paratracheal and aortopulmonary window regions, with largest lymph  nodes in the right perihilar region demonstrating evidence of central necrosis, almost certainly compatible with  metastatic lymphadenopathy throughout and further evidence of progression of disease. 3. Large left pleural effusion, likely malignant effusion, with adjacent compressive atelectasis. Heart is displaced to the right by the large left pleural effusion. 4. Emphysematous change, upper lobe predominant. 5. No definite evidence of osseous metastasis. Scattered tiny lucent foci within the thoracic vertebral bodies could conceivably represent early developing metastases.      11/30/2015 - 12/06/2015 Hospital Admission    She was admitted for severe shortness of breath and was found to have malignant effusion      12/01/2015 Imaging    As seen on yesterday's chest CT, there is metastatic disease to both lung bases with a large malignant left pleural effusion. There are several pleural-based metastases on the left and right infrahilar lymphadenopathy, incompletely visualized. 2. Left periaortic and left pelvic side wall lymphadenopathy consistent with metastatic disease. No other evidence of abdominal  pelvic metastatic disease. 3. No hydronephrosis.      12/04/2015 Procedure    Successful placement of a tunneled left pleural catheter with ultrasound and fluoroscopic guidance.       12/22/2015 Pathology Results    Pleura, peel, Left pleural peel - RARE MALIGNANT CELLS. - SEE MICROSCOPIC DESCRIPTION Microscopic Comment The specimen consists of abundant fibrin with rare minute aggregates of epithelial cells with malignant features consistent with poorly differentiated carcinoma.       12/22/2015 - 12/26/2015 Hospital Admission    She was admitted for management of malignant pleural effusion      12/22/2015 Surgery    OPERATION:  Left VATS (video-assisted thoracoscopic surgery) with drainage of loculated malignant effusion, chemical pleurodesis with doxycycline, placement of PleurX catheter.  PREOPERATIVE DIAGNOSES:  Loculated left malignant effusion, history of endometrial carcinoma, advanced  age.  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      01/04/2016 - 05/16/2016 Chemotherapy    She received carboplatin & Taxol       03/19/2016 Imaging    Interval response to therapy. 2. Decrease in volume of left pleural effusion. The index lesions within the chest have decreased in size in the interval. 3. Interval decrease in size of retroperitoneal and left pelvic side wall adenopathy. 4. Aortic atherosclerosis.       06/21/2016 Imaging    Apparent interval mixed response to therapy. Pleural disease in the chest has progressed along with progression of mediastinal lymphadenopathy. Some pulmonary nodules progressed while others have decreased in size in the interval. The index abdominal retroperitoneal left pelvic sidewall lymph nodes have decreased in the interval. 2. Persistent small left pleural effusion with posterior left base collapse/consolidation. 3.  Abdominal Aortic Atherosclerois       07/10/2016 Imaging    Left ventricle: The cavity size was normal. Wall thickness was increased in a pattern of mild LVH. Systolic function was normal. The estimated ejection fraction was in the range of 60% to 65%. Wall motion was normal; there were no regional wall motion abnormalities. Doppler parameters are consistent with abnormal left ventricular relaxation (grade 1 diastolic dysfunction).       07/11/2016 - 08/08/2016 Chemotherapy    She received Doxil only       07/25/2016 Adverse Reaction    She felt dehydrated, dizzy, had mucositis, constipated and miserable      09/02/2016 Imaging    Mild increase in mediastinal lymphadenopathy. Stable  mild bilateral hilar lymphadenopathy. Mild interval progression of bilateral pulmonary metastases. Stable small left pleural effusion. Stable mild left external iliac lymphadenopathy. No new or progressive metastatic disease within the abdomen or  pelvis       INTERVAL HISTORY: Please see below for problem oriented charting. She returns for further follow-up. She has mild anxiety She has some mild abdominal discomfort and chest wall discomfort Denies cough Her appetite is poor It is starting to improve since discontinuation of recent chemotherapy  REVIEW OF SYSTEMS:   Constitutional: Denies fevers, chills or abnormal weight loss Eyes: Denies blurriness of vision Ears, nose, mouth, throat, and face: Denies mucositis or sore throat Respiratory: Denies cough, dyspnea or wheezes Cardiovascular: Denies palpitation, chest discomfort or lower extremity swelling Gastrointestinal:  Denies nausea, heartburn or change in bowel habits Skin: Denies abnormal skin rashes Lymphatics: Denies new lymphadenopathy or easy bruising Neurological:Denies numbness, tingling or new weaknesses Behavioral/Psych: Mood is stable, no new changes  All other systems were reviewed with the patient and are negative.  I have reviewed the past medical history, past surgical history, social history and family history with the patient and they are unchanged from previous note.  ALLERGIES:  is allergic to carboplatin and levaquin [levofloxacin in d5w].  MEDICATIONS:  Current Outpatient Prescriptions  Medication Sig Dispense Refill  . Calcium Citrate-Vitamin D (CALCIUM + D PO) Take 1 tablet by mouth daily.    . cholecalciferol (VITAMIN D) 1000 units tablet Take 1,000 Units by mouth daily.    . Cyanocobalamin (VITAMIN B-12 PO) Take 1 tablet by mouth daily.    . ferrous sulfate 325 (65 FE) MG tablet Take 325 mg by mouth daily.    . fludrocortisone (FLORINEF) 0.1 MG tablet Take 1 tablet (0.1 mg total) by mouth daily. 90 tablet 3  . loratadine (CLARITIN) 10 MG tablet Take 10 mg by mouth daily as needed for allergies.    . Multiple Vitamin (MULTI-VITAMINS) TABS Take 1 tablet by mouth daily.    . naproxen sodium (ANAPROX) 220 MG tablet Take 220 mg by mouth 2 (two)  times daily as needed.    . polyethylene glycol (MIRALAX / GLYCOLAX) packet Take 17 g by mouth daily as needed for mild constipation.     Marland Kitchen LORazepam (ATIVAN) 0.5 MG tablet Take 1 tablet (0.5 mg total) by mouth 2 (two) times daily as needed for anxiety. 30 tablet 0  . mirtazapine (REMERON) 15 MG tablet Take 1 tablet (15 mg total) by mouth at bedtime. (Patient not taking: Reported on 09/11/2016) 90 tablet 1  . PROAIR HFA 108 (90 Base) MCG/ACT inhaler INHALE 1 PUFF BY MOUTH FOUR TIMES DAILY (Patient not taking: Reported on 09/11/2016) 8.5 g 0  . promethazine (PHENERGAN) 12.5 MG tablet Take 1 tablet (12.5 mg total) by mouth every 6 (six) hours as needed for nausea or vomiting. (Patient not taking: Reported on 09/11/2016) 30 tablet 0  . traMADol (ULTRAM) 50 MG tablet Take 1 tablet (50 mg total) by mouth every 6 (six) hours as needed. (Patient not taking: Reported on 09/11/2016) 30 tablet 0   No current facility-administered medications for this visit.    Facility-Administered Medications Ordered in Other Visits  Medication Dose Route Frequency Provider Last Rate Last Dose  . sodium chloride 0.9 % injection 10 mL  10 mL Intravenous PRN Lennis P Livesay, MD   10 mL at 02/15/16 1601  . sodium chloride flush (NS) 0.9 % injection 10 mL  10 mL Intracatheter PRN Heath Lark, MD  10 mL at 09/11/16 1333    PHYSICAL EXAMINATION: ECOG PERFORMANCE STATUS: 2 - Symptomatic, <50% confined to bed  Vitals:   09/11/16 1001  BP: 92/68  Pulse: 89  Resp: 17  Temp: 98.4 F (36.9 C)   Filed Weights   09/11/16 1001  Weight: 135 lb 1.6 oz (61.3 kg)    GENERAL:alert, no distress and comfortable. She looks frail, thin and cachectic SKIN: skin color, texture, turgor are normal, no rashes or significant lesions EYES: normal, Conjunctiva are pink and non-injected, sclera clear OROPHARYNX:no exudate, no erythema and lips, buccal mucosa, and tongue normal  NECK: supple, thyroid normal size, non-tender, without  nodularity LYMPH:  no palpable lymphadenopathy in the cervical, axillary or inguinal LUNGS: clear to auscultation and percussion with normal breathing effort HEART: regular rate & rhythm and no murmurs and no lower extremity edema ABDOMEN:abdomen soft, non-tender and normal bowel sounds Musculoskeletal:no cyanosis of digits and no clubbing  NEURO: alert & oriented x 3 with fluent speech, no focal motor/sensory deficits  LABORATORY DATA:  I have reviewed the data as listed    Component Value Date/Time   NA 136 09/11/2016 0926   K 4.1 09/11/2016 0926   CL 101 12/24/2015 0639   CO2 24 09/11/2016 0926   GLUCOSE 187 (H) 09/11/2016 0926   BUN 11.1 09/11/2016 0926   CREATININE 0.8 09/11/2016 0926   CALCIUM 10.1 09/11/2016 0926   PROT 8.9 (H) 09/11/2016 0926   ALBUMIN 2.5 (L) 09/11/2016 0926   AST 23 09/11/2016 0926   ALT 40 09/11/2016 0926   ALKPHOS 217 (H) 09/11/2016 0926   BILITOT 0.34 09/11/2016 0926   GFRNONAA >60 12/24/2015 0639   GFRAA >60 12/24/2015 0639    No results found for: SPEP, UPEP  Lab Results  Component Value Date   WBC 5.5 09/11/2016   NEUTROABS 3.9 09/11/2016   HGB 8.9 (L) 09/11/2016   HCT 27.9 (L) 09/11/2016   MCV 99.6 09/11/2016   PLT 380 09/11/2016      Chemistry      Component Value Date/Time   NA 136 09/11/2016 0926   K 4.1 09/11/2016 0926   CL 101 12/24/2015 0639   CO2 24 09/11/2016 0926   BUN 11.1 09/11/2016 0926   CREATININE 0.8 09/11/2016 0926      Component Value Date/Time   CALCIUM 10.1 09/11/2016 0926   ALKPHOS 217 (H) 09/11/2016 0926   AST 23 09/11/2016 0926   ALT 40 09/11/2016 0926   BILITOT 0.34 09/11/2016 0926       RADIOGRAPHIC STUDIES: I have personally reviewed the radiological images as listed and agreed with the findings in the report. Ct Chest W Contrast  Result Date: 09/02/2016 CLINICAL DATA:  Followup metastatic endometrial carcinoma. Ongoing chemotherapy. Restaging. EXAM: CT CHEST, ABDOMEN, AND PELVIS WITH CONTRAST  TECHNIQUE: Multidetector CT imaging of the chest, abdomen and pelvis was performed following the standard protocol during bolus administration of intravenous contrast. CONTRAST:  100 mL ISOVUE-300 IOPAMIDOL (ISOVUE-300) INJECTION 61% COMPARISON:  06/21/2016 FINDINGS: CT CHEST FINDINGS Cardiovascular: No acute findings. Stable 4.0 cm ascending thoracic aortic aneurysm. Mediastinum/Lymph Nodes: Mediastinal lymphadenopathy shows mild increase in size since previous study. Adenopathy in the lateral aortic region measures 2.5 x 3.7 cm on image 27/ 2 compared to 1.9 x 3.3 cm previously. Subcarinal lymphadenopathy measures 2.2 cm short axis on image 33/ 2 compared to 1.8 cm previously. Mild bilateral hilar lymphadenopathy shows no significant change. Lungs/Pleura: Small left pleural effusion shows no significant change. Moderate emphysema again  noted. Bilateral pulmonary metastases show mild increase in size. Index nodule in the posterior right lower lobe measures 2.4 cm on image 119/6 compared to 1.8 cm previously. Index nodule in the superior left lower lobe abutting the major fissure measures 2.4 cm on image 60/6 compared to 1.7 cm previously . Musculoskeletal:  No suspicious bone lesions identified. CT ABDOMEN AND PELVIS FINDINGS Hepatobiliary: No masses identified. Gallbladder is unremarkable. Pancreas:  No mass or inflammatory changes. Spleen:  Within normal limits in size and appearance. Adrenals/Urinary tract: No masses or hydronephrosis. Small benign-appearing bilateral renal cysts remain stable. Stomach/Bowel: No evidence of obstruction, inflammatory process, or abnormal fluid collections. Vascular/Lymphatic: 1.5 cm left external iliac lymph node on image 101/2 shows no significant change compared to previous study. No new or progressive lymphadenopathy identified. Aortic atherosclerosis. No abdominal aortic aneurysm. Reproductive: Prior hysterectomy noted. Adnexal regions are unremarkable in appearance. Other:   None. Musculoskeletal:  No suspicious bone lesions identified. IMPRESSION: Mild increase in mediastinal lymphadenopathy. Stable mild bilateral hilar lymphadenopathy. Mild interval progression of bilateral pulmonary metastases. Stable small left pleural effusion. Stable mild left external iliac lymphadenopathy. No new or progressive metastatic disease within the abdomen or pelvis. Electronically Signed   By: Earle Gell M.D.   On: 09/02/2016 17:01   Ct Abdomen Pelvis W Contrast  Result Date: 09/02/2016 CLINICAL DATA:  Followup metastatic endometrial carcinoma. Ongoing chemotherapy. Restaging. EXAM: CT CHEST, ABDOMEN, AND PELVIS WITH CONTRAST TECHNIQUE: Multidetector CT imaging of the chest, abdomen and pelvis was performed following the standard protocol during bolus administration of intravenous contrast. CONTRAST:  100 mL ISOVUE-300 IOPAMIDOL (ISOVUE-300) INJECTION 61% COMPARISON:  06/21/2016 FINDINGS: CT CHEST FINDINGS Cardiovascular: No acute findings. Stable 4.0 cm ascending thoracic aortic aneurysm. Mediastinum/Lymph Nodes: Mediastinal lymphadenopathy shows mild increase in size since previous study. Adenopathy in the lateral aortic region measures 2.5 x 3.7 cm on image 27/ 2 compared to 1.9 x 3.3 cm previously. Subcarinal lymphadenopathy measures 2.2 cm short axis on image 33/ 2 compared to 1.8 cm previously. Mild bilateral hilar lymphadenopathy shows no significant change. Lungs/Pleura: Small left pleural effusion shows no significant change. Moderate emphysema again noted. Bilateral pulmonary metastases show mild increase in size. Index nodule in the posterior right lower lobe measures 2.4 cm on image 119/6 compared to 1.8 cm previously. Index nodule in the superior left lower lobe abutting the major fissure measures 2.4 cm on image 60/6 compared to 1.7 cm previously . Musculoskeletal:  No suspicious bone lesions identified. CT ABDOMEN AND PELVIS FINDINGS Hepatobiliary: No masses identified. Gallbladder  is unremarkable. Pancreas:  No mass or inflammatory changes. Spleen:  Within normal limits in size and appearance. Adrenals/Urinary tract: No masses or hydronephrosis. Small benign-appearing bilateral renal cysts remain stable. Stomach/Bowel: No evidence of obstruction, inflammatory process, or abnormal fluid collections. Vascular/Lymphatic: 1.5 cm left external iliac lymph node on image 101/2 shows no significant change compared to previous study. No new or progressive lymphadenopathy identified. Aortic atherosclerosis. No abdominal aortic aneurysm. Reproductive: Prior hysterectomy noted. Adnexal regions are unremarkable in appearance. Other:  None. Musculoskeletal:  No suspicious bone lesions identified. IMPRESSION: Mild increase in mediastinal lymphadenopathy. Stable mild bilateral hilar lymphadenopathy. Mild interval progression of bilateral pulmonary metastases. Stable small left pleural effusion. Stable mild left external iliac lymphadenopathy. No new or progressive metastatic disease within the abdomen or pelvis. Electronically Signed   By: Earle Gell M.D.   On: 09/02/2016 17:01    ASSESSMENT & PLAN:  Endometrial cancer ([place]) The patient tolerated treatment very poorly. Unfortunately, CT  scan and tumor markers showed disease progression. She is very frail She desire further palliative chemotherapy We reviewed current guidelines with her Treatment goal is strictly palliative She denies significant peripheral neuropathy from prior treatment I recommend weekly Abraxane, on days 1 and 8 and rest day 15 for cycles of every 21 days Some of the expected risks, benefits, side effects of chemotherapy including pancytopenia, peripheral neuropathy, risk of infection, etc. were discussed with the patient and she wants to proceed I plan to see her back before the first day of treatment for further supportive care With her frail status, I will give her 25% dose adjustment In the meantime, I would give her  some IV fluid hydration and antiemetics to take as needed  Antineoplastic chemotherapy induced anemia This is likely due to recent treatment. The patient denies recent history of bleeding such as epistaxis, hematuria or hematochezia. She is asymptomatic from the anemia. I will observe for now.    Cachexia ([place]) Due to disease and recent chemo I encouraged her to increase oral intake as tolerated  Dehydration Her blood pressure is low and she appears clinically dehydrated. We will proceed with treatment with additional IV fluid hydration.  Malignant neoplasm metastatic to lung [facility]) She has pleural-based disease and that has cause significant discomfort She denies pain. I recommend conservative management  Goals of care, counseling/discussion The patient is aware she has incurable disease and treatment is strictly palliative. We discussed importance of Advanced Directives and Living will. Her daughter is her medical power of attorney   No orders of the defined types were placed in this encounter.  All questions were answered. The patient knows to call the clinic with any problems, questions or concerns. No barriers to learning was detected. I spent 30 minutes counseling the patient face to face. The total time spent in the appointment was 40 minutes and more than 50% was on counseling and review of test results     Heath Lark, MD 09/11/2016 3:14 PM

## 2016-09-11 NOTE — Progress Notes (Signed)
DISCONTINUE OFF PATHWAY REGIMEN - Uterine     A cycle is every 21 days:     Doxorubicin        Dose Mod: None  **Always confirm dose/schedule in your pharmacy ordering system**    REASON: Disease Progression PRIOR TREATMENT: UTOS200: Doxorubicin 50 mg/m2 IVP q21 Days TREATMENT RESPONSE: Progressive Disease (PD)  START OFF PATHWAY REGIMEN - Uterine   [id number]:Nab-Paclitaxel (Abraxane(R)) 100 mg/m2 Days 1, 8, and 15 q21 days:   A cycle is 21 days:     Nab-paclitaxel (protein bound)   **Always confirm dose/schedule in your pharmacy ordering system**    Patient Characteristics: Papillary Serous and Clear Cell Histology, Third Line and Beyond AJCC T Category: T1b AJCC N Category: N2 AJCC M Category: M1 AJCC 8 Stage Grouping: IVB Would you be surprised if this patient died  in the next year? I would be surprised if this patient died in the next year Line of therapy: Third Line and Beyond  Intent of Therapy: Non-Curative / Palliative Intent, Discussed with Patient

## 2016-09-11 NOTE — Assessment & Plan Note (Addendum)
Due to disease and recent chemo I encouraged her to increase oral intake as tolerated

## 2016-09-11 NOTE — Telephone Encounter (Signed)
Scheduled appts per 09/11/2016 los. Gave patient AVS and calender .

## 2016-09-20 ENCOUNTER — Telehealth: Payer: Self-pay | Admitting: Hematology and Oncology

## 2016-09-20 ENCOUNTER — Other Ambulatory Visit (HOSPITAL_BASED_OUTPATIENT_CLINIC_OR_DEPARTMENT_OTHER): Payer: Medicare Other

## 2016-09-20 ENCOUNTER — Ambulatory Visit (HOSPITAL_BASED_OUTPATIENT_CLINIC_OR_DEPARTMENT_OTHER): Payer: Medicare Other

## 2016-09-20 ENCOUNTER — Other Ambulatory Visit: Payer: Self-pay | Admitting: *Deleted

## 2016-09-20 ENCOUNTER — Ambulatory Visit: Payer: Medicare Other

## 2016-09-20 ENCOUNTER — Other Ambulatory Visit: Payer: Self-pay | Admitting: Hematology and Oncology

## 2016-09-20 ENCOUNTER — Ambulatory Visit (HOSPITAL_BASED_OUTPATIENT_CLINIC_OR_DEPARTMENT_OTHER): Payer: Medicare Other | Admitting: Hematology and Oncology

## 2016-09-20 ENCOUNTER — Encounter: Payer: Self-pay | Admitting: Hematology and Oncology

## 2016-09-20 VITALS — BP 98/65 | HR 91 | Temp 97.5°F | Resp 18 | Ht 66.5 in | Wt 139.1 lb

## 2016-09-20 DIAGNOSIS — C541 Malignant neoplasm of endometrium: Secondary | ICD-10-CM

## 2016-09-20 DIAGNOSIS — C7801 Secondary malignant neoplasm of right lung: Secondary | ICD-10-CM

## 2016-09-20 DIAGNOSIS — G620 Drug-induced polyneuropathy: Secondary | ICD-10-CM

## 2016-09-20 DIAGNOSIS — F418 Other specified anxiety disorders: Secondary | ICD-10-CM | POA: Diagnosis not present

## 2016-09-20 DIAGNOSIS — Z5111 Encounter for antineoplastic chemotherapy: Secondary | ICD-10-CM | POA: Diagnosis present

## 2016-09-20 DIAGNOSIS — T451X5A Adverse effect of antineoplastic and immunosuppressive drugs, initial encounter: Principal | ICD-10-CM

## 2016-09-20 DIAGNOSIS — D6481 Anemia due to antineoplastic chemotherapy: Secondary | ICD-10-CM | POA: Diagnosis not present

## 2016-09-20 DIAGNOSIS — C7802 Secondary malignant neoplasm of left lung: Secondary | ICD-10-CM

## 2016-09-20 DIAGNOSIS — E46 Unspecified protein-calorie malnutrition: Secondary | ICD-10-CM

## 2016-09-20 DIAGNOSIS — R0789 Other chest pain: Secondary | ICD-10-CM

## 2016-09-20 DIAGNOSIS — G893 Neoplasm related pain (acute) (chronic): Secondary | ICD-10-CM

## 2016-09-20 DIAGNOSIS — C7800 Secondary malignant neoplasm of unspecified lung: Secondary | ICD-10-CM

## 2016-09-20 DIAGNOSIS — E440 Moderate protein-calorie malnutrition: Secondary | ICD-10-CM

## 2016-09-20 DIAGNOSIS — R4589 Other symptoms and signs involving emotional state: Secondary | ICD-10-CM | POA: Insufficient documentation

## 2016-09-20 LAB — COMPREHENSIVE METABOLIC PANEL
ALBUMIN: 2.3 g/dL — AB (ref 3.5–5.0)
ALK PHOS: 213 U/L — AB (ref 40–150)
ALT: 25 U/L (ref 0–55)
ANION GAP: 9 meq/L (ref 3–11)
AST: 20 U/L (ref 5–34)
BILIRUBIN TOTAL: 0.28 mg/dL (ref 0.20–1.20)
BUN: 9.7 mg/dL (ref 7.0–26.0)
CALCIUM: 9.9 mg/dL (ref 8.4–10.4)
CO2: 27 meq/L (ref 22–29)
CREATININE: 0.7 mg/dL (ref 0.6–1.1)
Chloride: 104 mEq/L (ref 98–109)
Glucose: 171 mg/dl — ABNORMAL HIGH (ref 70–140)
Potassium: 3.9 mEq/L (ref 3.5–5.1)
Sodium: 140 mEq/L (ref 136–145)
TOTAL PROTEIN: 8.6 g/dL — AB (ref 6.4–8.3)

## 2016-09-20 LAB — CBC WITH DIFFERENTIAL/PLATELET
BASO%: 1 % (ref 0.0–2.0)
Basophils Absolute: 0.1 10*3/uL (ref 0.0–0.1)
EOS%: 0.7 % (ref 0.0–7.0)
Eosinophils Absolute: 0 10*3/uL (ref 0.0–0.5)
HCT: 26.3 % — ABNORMAL LOW (ref 34.8–46.6)
HGB: 8.7 g/dL — ABNORMAL LOW (ref 11.6–15.9)
LYMPH%: 15.2 % (ref 14.0–49.7)
MCH: 32.6 pg (ref 25.1–34.0)
MCHC: 33.1 g/dL (ref 31.5–36.0)
MCV: 98.3 fL (ref 79.5–101.0)
MONO#: 0.4 10*3/uL (ref 0.1–0.9)
MONO%: 7.5 % (ref 0.0–14.0)
NEUT#: 4.4 10*3/uL (ref 1.5–6.5)
NEUT%: 75.6 % (ref 38.4–76.8)
Platelets: 431 10*3/uL — ABNORMAL HIGH (ref 145–400)
RBC: 2.67 10*6/uL — ABNORMAL LOW (ref 3.70–5.45)
RDW: 19 % — ABNORMAL HIGH (ref 11.2–14.5)
WBC: 5.8 10*3/uL (ref 3.9–10.3)
lymph#: 0.9 10*3/uL (ref 0.9–3.3)

## 2016-09-20 MED ORDER — HEPARIN SOD (PORK) LOCK FLUSH 100 UNIT/ML IV SOLN
500.0000 [IU] | Freq: Once | INTRAVENOUS | Status: AC | PRN
  Administered 2016-09-20: 500 [IU]
  Filled 2016-09-20: qty 5

## 2016-09-20 MED ORDER — SODIUM CHLORIDE 0.9% FLUSH
10.0000 mL | INTRAVENOUS | Status: DC | PRN
  Administered 2016-09-20: 10 mL
  Filled 2016-09-20: qty 10

## 2016-09-20 MED ORDER — TRAMADOL HCL 50 MG PO TABS: 50.0000 mg | ORAL_TABLET | Freq: Four times a day (QID) | ORAL | 0 refills | Status: DC | PRN

## 2016-09-20 MED ORDER — PROCHLORPERAZINE MALEATE 10 MG PO TABS
ORAL_TABLET | ORAL | Status: AC
  Filled 2016-09-20: qty 1

## 2016-09-20 MED ORDER — PROCHLORPERAZINE MALEATE 10 MG PO TABS
10.0000 mg | ORAL_TABLET | Freq: Once | ORAL | Status: AC
  Administered 2016-09-20: 10 mg via ORAL

## 2016-09-20 MED ORDER — HEPARIN SOD (PORK) LOCK FLUSH 100 UNIT/ML IV SOLN
500.0000 [IU] | Freq: Once | INTRAVENOUS | Status: AC
  Administered 2016-09-20: 500 [IU]
  Filled 2016-09-20: qty 5

## 2016-09-20 MED ORDER — SODIUM CHLORIDE 0.9% FLUSH
10.0000 mL | Freq: Once | INTRAVENOUS | Status: AC
  Administered 2016-09-20: 10 mL
  Filled 2016-09-20: qty 10

## 2016-09-20 MED ORDER — SODIUM CHLORIDE 0.9 % IV SOLN
Freq: Once | INTRAVENOUS | Status: AC
  Administered 2016-09-20: 10:00:00 via INTRAVENOUS

## 2016-09-20 MED ORDER — PACLITAXEL PROTEIN-BOUND CHEMO INJECTION 100 MG
75.0000 mg/m2 | Freq: Once | INTRAVENOUS | Status: AC
  Administered 2016-09-20: 125 mg via INTRAVENOUS
  Filled 2016-09-20: qty 25

## 2016-09-20 NOTE — Assessment & Plan Note (Signed)
She has significant symptoms of anxiety I recommend she take lorazepam as needed

## 2016-09-20 NOTE — Assessment & Plan Note (Signed)
She complained of chest wall discomfort. I suspect this is due to cancer pain. She declined pain medicine. I recommend she takes tramadol as needed

## 2016-09-20 NOTE — Patient Instructions (Signed)
[facility] Discharge Instructions for Patients Receiving Chemotherapy  Today you received the following chemotherapy agents abraxane  To help prevent nausea and vomiting after your treatment, we encourage you to take your nausea medication    If you develop nausea and vomiting that is not controlled by your nausea medication, call the clinic.   BELOW ARE SYMPTOMS THAT SHOULD BE REPORTED IMMEDIATELY:  *FEVER GREATER THAN 100.5 F  *CHILLS WITH OR WITHOUT FEVER  NAUSEA AND VOMITING THAT IS NOT CONTROLLED WITH YOUR NAUSEA MEDICATION  *UNUSUAL SHORTNESS OF BREATH  *UNUSUAL BRUISING OR BLEEDING  TENDERNESS IN MOUTH AND THROAT WITH OR WITHOUT PRESENCE OF ULCERS  *URINARY PROBLEMS  *BOWEL PROBLEMS  UNUSUAL RASH Items with * indicate a potential emergency and should be followed up as soon as possible.  Feel free to call the clinic you have any questions or concerns. The clinic phone number is [phone].  Please show the CHEMO ALERT CARD at check-in to the Emergency Department and triage nurse.   

## 2016-09-20 NOTE — Assessment & Plan Note (Signed)
This is likely due to recent treatment. The patient denies recent history of bleeding such as epistaxis, hematuria or hematochezia. She is asymptomatic from the anemia. I will observe for now.   

## 2016-09-20 NOTE — Assessment & Plan Note (Signed)
She is doing better with aggressive supportive management. She has gained some weight on steroid treatment. I reinforced importance of her taking mirtazapine. We will proceed with treatment and I will see her back in 10 days for further supportive care and review of toxicity

## 2016-09-20 NOTE — Telephone Encounter (Signed)
Appointments scheduled per 4.5.18 LOS. Patient given AVS report and calendars with future scheduled appointments. °

## 2016-09-20 NOTE — Assessment & Plan Note (Signed)
She has recent malignant cachexia with poor oral intake. Her weight is stable. Serum albumin remains low. She was started on mirtazapine and prednisone recently and she felt that appetite has mildly improved. I recommend she increase oral intake as tolerated

## 2016-09-20 NOTE — Progress Notes (Signed)
[place] OFFICE PROGRESS NOTE  Patient Care Team: Jacquelyne Balint, MD as PCP - General (Gynecologic Oncology)  SUMMARY OF ONCOLOGIC HISTORY:   Endometrial cancer [facility])   03/07/2014 Imaging    CT scan showed large uterine mass      03/07/2014 Initial Diagnosis    Surgery by Dr Jacquelyne Balint at [facility] in [place] on 04-06-14 TAH, BSO, omentectomy, pelvic and right common iliac node evaluation. 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 Bulk of the carcinoma was in lower uterine segment where it was within 0.1 cm of serosal surface. Cytology positive for adenocarcinoma on washings ([id number]). Surgical findings were significant for no paraaortic adenopathy apparent. Patient received 3 cycles of adjuvant chemotherapy in [place] by Dr Sabra Heck from11-19-15 thru2-12-16, with neulasta      08/15/2014 Imaging    Interval hysterectomy. 2. Retroperitoneal low-density structures which are suspicious for necrotic adenopathy/metastasis. Left periaortic component is slightly decreased in size. However, there is new soft tissue thickening about the abdominal aorta more superiorly. Less likely differential considerations include benign lymphangioma/lymphangiomas and/or concurrent vasculitis. Consider PET for further evaluation. 3. Development of bilateral pleural effusions with left base atelectasis. 4. Indeterminate bibasilar pulmonary nodules, felt to be similar. Consider further evaluation with chest CT, as recommended on 03/25/2014. 5.  Possible constipation.      08/24/2014 - 10/21/2014 Chemotherapy    She received 2 cycles of carbo/taxol. Cycle 3 with [place] only       11/10/2014  Imaging    Prior periaortic lucency of concern at the hiatus has resolved and was probably transient and inflammatory or due to third spacing of fluid. The pericardial effusion and pleural effusions have also resolved. 2. Upper normal sized lymph node just above the right inferior pulmonary vein is new compared to the prior exam and may merit Observation. 3. Tiny scattered pulmonary nodules remain stable. 4.  Prominent stool throughout the colon favors constipation. 5.  Aortoiliac atherosclerotic vascular disease. 6. Degenerative arthropathy of the hips and degenerative disc disease in the lumbar spine. 7. Small ventral supraumbilical hernia contains adipose tissue. 8. Emphysema. 9. Left anterior descending coronary artery atherosclerosis. 10. Ascending thoracic aortic aneurysm.       12/25/2014 Imaging    No acute findings within the abdomen or pelvis. No evidence of recurrent or metastatic carcinoma. Large stool burden again demonstrated; suggest clinical correlation for possible constipation.       01/03/2015 - 01/26/2015 Radiation Therapy    July 19, July 27, July 29, August 9, August 11 Site/dose: Proximal vagina, 30 gray in 5 fractions      11/30/2015 Imaging    Significant progression of disease, as detailed above. This includes numerous pulmonary masses/nodules within the right lung, largest of which is at the right lung base measuring 3.1 x 2.6 cm. A new pleural based mass is now seen along the medial aspects of the left upper lobe measuring 2.6 x 1.7 cm. On earlier chest CT of 11/04/2014, the largest pulmonary nodule identified within the left lung was 6 mm and the largest pulmonary nodule identified within the right lung was 4 mm. 2. Mediastinal and perihilar lymphadenopathy, most numerous within the left lower paratracheal and aortopulmonary window regions, with largest lymph  nodes in the right perihilar region demonstrating evidence of central necrosis, almost certainly compatible with  metastatic lymphadenopathy throughout and further evidence of progression of disease. 3. Large left pleural effusion, likely malignant effusion, with adjacent compressive atelectasis. Heart is displaced to the right by the large left pleural effusion. 4. Emphysematous change, upper lobe predominant. 5. No definite evidence of osseous metastasis. Scattered tiny lucent foci within the thoracic vertebral bodies could conceivably represent early developing metastases.      11/30/2015 - 12/06/2015 Hospital Admission    She was admitted for severe shortness of breath and was found to have malignant effusion      12/01/2015 Imaging    As seen on yesterday's chest CT, there is metastatic disease to both lung bases with a large malignant left pleural effusion. There are several pleural-based metastases on the left and right infrahilar lymphadenopathy, incompletely visualized. 2. Left periaortic and left pelvic side wall lymphadenopathy consistent with metastatic disease. No other evidence of abdominal  pelvic metastatic disease. 3. No hydronephrosis.      12/04/2015 Procedure    Successful placement of a tunneled left pleural catheter with ultrasound and fluoroscopic guidance.       12/22/2015 Pathology Results    Pleura, peel, Left pleural peel - RARE MALIGNANT CELLS. - SEE MICROSCOPIC DESCRIPTION Microscopic Comment The specimen consists of abundant fibrin with rare minute aggregates of epithelial cells with malignant features consistent with poorly differentiated carcinoma.       12/22/2015 - 12/26/2015 Hospital Admission    She was admitted for management of malignant pleural effusion      12/22/2015 Surgery    OPERATION:  Left VATS (video-assisted thoracoscopic surgery) with drainage of loculated malignant effusion, chemical pleurodesis with doxycycline, placement of PleurX catheter.  PREOPERATIVE DIAGNOSES:  Loculated left malignant effusion, history of endometrial carcinoma, advanced  age.  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      01/04/2016 - 05/16/2016 Chemotherapy    She received carboplatin & Taxol       03/19/2016 Imaging    Interval response to therapy. 2. Decrease in volume of left pleural effusion. The index lesions within the chest have decreased in size in the interval. 3. Interval decrease in size of retroperitoneal and left pelvic side wall adenopathy. 4. Aortic atherosclerosis.       06/21/2016 Imaging    Apparent interval mixed response to therapy. Pleural disease in the chest has progressed along with progression of mediastinal lymphadenopathy. Some pulmonary nodules progressed while others have decreased in size in the interval. The index abdominal retroperitoneal left pelvic sidewall lymph nodes have decreased in the interval. 2. Persistent small left pleural effusion with posterior left base collapse/consolidation. 3.  Abdominal Aortic Atherosclerois       07/10/2016 Imaging    Left ventricle: The cavity size was normal. Wall thickness was increased in a pattern of mild LVH. Systolic function was normal. The estimated ejection fraction was in the range of 60% to 65%. Wall motion was normal; there were no regional wall motion abnormalities. Doppler parameters are consistent with abnormal left ventricular relaxation (grade 1 diastolic dysfunction).       07/11/2016 - 08/08/2016 Chemotherapy    She received Doxil only       07/25/2016 Adverse Reaction    She felt dehydrated, dizzy, had mucositis, constipated and miserable      09/02/2016 Imaging    Mild increase in mediastinal lymphadenopathy. Stable  mild bilateral hilar lymphadenopathy. Mild interval progression of bilateral pulmonary metastases. Stable small left pleural effusion. Stable mild left external iliac lymphadenopathy. No new or progressive metastatic disease within the abdomen or  pelvis      09/20/2016 -  Chemotherapy    The patient had palliative chemo with Abraxane       INTERVAL HISTORY: Please see below for problem oriented charting. She is seen today to start treatment She has gained some weight since her last time I saw her She is eating better She is more amenable to start taking pain medicine regularly She has persistent anxiety, improved The patient denies any recent signs or symptoms of bleeding such as spontaneous epistaxis, hematuria or hematochezia.  REVIEW OF SYSTEMS:   Constitutional: Denies fevers, chills or abnormal weight loss Eyes: Denies blurriness of vision Ears, nose, mouth, throat, and face: Denies mucositis or sore throat Respiratory: Denies cough, dyspnea or wheezes Cardiovascular: Denies palpitation, chest discomfort or lower extremity swelling Gastrointestinal:  Denies nausea, heartburn or change in bowel habits Skin: Denies abnormal skin rashes Lymphatics: Denies new lymphadenopathy or easy bruising Neurological:Denies numbness, tingling or new weaknesses Behavioral/Psych: Mood is stable, no new changes  All other systems were reviewed with the patient and are negative.  I have reviewed the past medical history, past surgical history, social history and family history with the patient and they are unchanged from previous note.  ALLERGIES:  is allergic to carboplatin and levaquin [levofloxacin in d5w].  MEDICATIONS:  Current Outpatient Prescriptions  Medication Sig Dispense Refill  . Calcium Citrate-Vitamin D (CALCIUM + D PO) Take 1 tablet by mouth daily.    . cholecalciferol (VITAMIN D) 1000 units tablet Take 1,000 Units by mouth daily.    . Cyanocobalamin (VITAMIN B-12 PO) Take 1 tablet by mouth daily.    . ferrous sulfate 325 (65 FE) MG tablet Take 325 mg by mouth daily.    . fludrocortisone (FLORINEF) 0.1 MG tablet Take 1 tablet (0.1 mg total) by mouth daily. 90 tablet 3  . LORazepam (ATIVAN) 0.5 MG tablet Take 1 tablet  (0.5 mg total) by mouth 2 (two) times daily as needed for anxiety. 30 tablet 0  . Multiple Vitamin (MULTI-VITAMINS) TABS Take 1 tablet by mouth daily.    . naproxen sodium (ANAPROX) 220 MG tablet Take 220 mg by mouth 2 (two) times daily as needed.    . polyethylene glycol (MIRALAX / GLYCOLAX) packet Take 17 g by mouth daily as needed for mild constipation.     . traMADol (ULTRAM) 50 MG tablet Take 1 tablet (50 mg total) by mouth every 6 (six) hours as needed. 90 tablet 0  . loratadine (CLARITIN) 10 MG tablet Take 10 mg by mouth daily as needed for allergies.    . mirtazapine (REMERON) 15 MG tablet Take 1 tablet (15 mg total) by mouth at bedtime. (Patient not taking: Reported on 09/20/2016) 90 tablet 1  . PROAIR HFA 108 (90 Base) MCG/ACT inhaler INHALE 1 PUFF BY MOUTH FOUR TIMES DAILY (Patient not taking: Reported on 09/11/2016) 8.5 g 0  . promethazine (PHENERGAN) 12.5 MG tablet Take 1 tablet (12.5 mg total) by mouth every 6 (six) hours as needed for nausea or vomiting. (Patient not taking: Reported on 09/11/2016) 30 tablet 0   No current facility-administered medications for this visit.    Facility-Administered Medications Ordered in Other Visits  Medication Dose Route Frequency Provider Last Rate Last Dose  . sodium chloride 0.9 % injection 10 mL  10 mL  Intravenous PRN Gordy Levan, MD   10 mL at 02/15/16 1601    PHYSICAL EXAMINATION: ECOG PERFORMANCE STATUS: 2 - Symptomatic, <50% confined to bed  Vitals:   09/20/16 0904  BP: 98/65  Pulse: 91  Resp: 18  Temp: 97.5 F (36.4 C)   Filed Weights   09/20/16 0904  Weight: 139 lb 1.6 oz (63.1 kg)    GENERAL:alert, no distress and comfortable.  She looks thin and cachectic SKIN: skin color, texture, turgor are normal, no rashes or significant lesions EYES: normal, Conjunctiva are pale and non-injected, sclera clear OROPHARYNX:no exudate, no erythema and lips, buccal mucosa, and tongue normal  NECK: supple, thyroid normal size,  non-tender, without nodularity LYMPH:  no palpable lymphadenopathy in the cervical, axillary or inguinal LUNGS: clear to auscultation and percussion with normal breathing effort HEART: regular rate & rhythm and no murmurs and no lower extremity edema ABDOMEN:abdomen soft, non-tender and normal bowel sounds Musculoskeletal:no cyanosis of digits and no clubbing  NEURO: alert & oriented x 3 with fluent speech, no focal motor/sensory deficits  LABORATORY DATA:  I have reviewed the data as listed    Component Value Date/Time   NA 140 09/20/2016 0826   K 3.9 09/20/2016 0826   CL 101 12/24/2015 0639   CO2 27 09/20/2016 0826   GLUCOSE 171 (H) 09/20/2016 0826   BUN 9.7 09/20/2016 0826   CREATININE 0.7 09/20/2016 0826   CALCIUM 9.9 09/20/2016 0826   PROT 8.6 (H) 09/20/2016 0826   ALBUMIN 2.3 (L) 09/20/2016 0826   AST 20 09/20/2016 0826   ALT 25 09/20/2016 0826   ALKPHOS 213 (H) 09/20/2016 0826   BILITOT 0.28 09/20/2016 0826   GFRNONAA >60 12/24/2015 0639   GFRAA >60 12/24/2015 0639    No results found for: SPEP, UPEP  Lab Results  Component Value Date   WBC 5.8 09/20/2016   NEUTROABS 4.4 09/20/2016   HGB 8.7 (L) 09/20/2016   HCT 26.3 (L) 09/20/2016   MCV 98.3 09/20/2016   PLT 431 (H) 09/20/2016      Chemistry      Component Value Date/Time   NA 140 09/20/2016 0826   K 3.9 09/20/2016 0826   CL 101 12/24/2015 0639   CO2 27 09/20/2016 0826   BUN 9.7 09/20/2016 0826   CREATININE 0.7 09/20/2016 0826      Component Value Date/Time   CALCIUM 9.9 09/20/2016 0826   ALKPHOS 213 (H) 09/20/2016 0826   AST 20 09/20/2016 0826   ALT 25 09/20/2016 0826   BILITOT 0.28 09/20/2016 0826       RADIOGRAPHIC STUDIES: I have personally reviewed the radiological images as listed and agreed with the findings in the report. Ct Chest W Contrast  Result Date: 09/02/2016 CLINICAL DATA:  Followup metastatic endometrial carcinoma. Ongoing chemotherapy. Restaging. EXAM: CT CHEST, ABDOMEN, AND  PELVIS WITH CONTRAST TECHNIQUE: Multidetector CT imaging of the chest, abdomen and pelvis was performed following the standard protocol during bolus administration of intravenous contrast. CONTRAST:  100 mL ISOVUE-300 IOPAMIDOL (ISOVUE-300) INJECTION 61% COMPARISON:  06/21/2016 FINDINGS: CT CHEST FINDINGS Cardiovascular: No acute findings. Stable 4.0 cm ascending thoracic aortic aneurysm. Mediastinum/Lymph Nodes: Mediastinal lymphadenopathy shows mild increase in size since previous study. Adenopathy in the lateral aortic region measures 2.5 x 3.7 cm on image 27/ 2 compared to 1.9 x 3.3 cm previously. Subcarinal lymphadenopathy measures 2.2 cm short axis on image 33/ 2 compared to 1.8 cm previously. Mild bilateral hilar lymphadenopathy shows no significant change. Lungs/Pleura: Small left  pleural effusion shows no significant change. Moderate emphysema again noted. Bilateral pulmonary metastases show mild increase in size. Index nodule in the posterior right lower lobe measures 2.4 cm on image 119/6 compared to 1.8 cm previously. Index nodule in the superior left lower lobe abutting the major fissure measures 2.4 cm on image 60/6 compared to 1.7 cm previously . Musculoskeletal:  No suspicious bone lesions identified. CT ABDOMEN AND PELVIS FINDINGS Hepatobiliary: No masses identified. Gallbladder is unremarkable. Pancreas:  No mass or inflammatory changes. Spleen:  Within normal limits in size and appearance. Adrenals/Urinary tract: No masses or hydronephrosis. Small benign-appearing bilateral renal cysts remain stable. Stomach/Bowel: No evidence of obstruction, inflammatory process, or abnormal fluid collections. Vascular/Lymphatic: 1.5 cm left external iliac lymph node on image 101/2 shows no significant change compared to previous study. No new or progressive lymphadenopathy identified. Aortic atherosclerosis. No abdominal aortic aneurysm. Reproductive: Prior hysterectomy noted. Adnexal regions are unremarkable in  appearance. Other:  None. Musculoskeletal:  No suspicious bone lesions identified. IMPRESSION: Mild increase in mediastinal lymphadenopathy. Stable mild bilateral hilar lymphadenopathy. Mild interval progression of bilateral pulmonary metastases. Stable small left pleural effusion. Stable mild left external iliac lymphadenopathy. No new or progressive metastatic disease within the abdomen or pelvis. Electronically Signed   By: Earle Gell M.D.   On: 09/02/2016 17:01   Ct Abdomen Pelvis W Contrast  Result Date: 09/02/2016 CLINICAL DATA:  Followup metastatic endometrial carcinoma. Ongoing chemotherapy. Restaging. EXAM: CT CHEST, ABDOMEN, AND PELVIS WITH CONTRAST TECHNIQUE: Multidetector CT imaging of the chest, abdomen and pelvis was performed following the standard protocol during bolus administration of intravenous contrast. CONTRAST:  100 mL ISOVUE-300 IOPAMIDOL (ISOVUE-300) INJECTION 61% COMPARISON:  06/21/2016 FINDINGS: CT CHEST FINDINGS Cardiovascular: No acute findings. Stable 4.0 cm ascending thoracic aortic aneurysm. Mediastinum/Lymph Nodes: Mediastinal lymphadenopathy shows mild increase in size since previous study. Adenopathy in the lateral aortic region measures 2.5 x 3.7 cm on image 27/ 2 compared to 1.9 x 3.3 cm previously. Subcarinal lymphadenopathy measures 2.2 cm short axis on image 33/ 2 compared to 1.8 cm previously. Mild bilateral hilar lymphadenopathy shows no significant change. Lungs/Pleura: Small left pleural effusion shows no significant change. Moderate emphysema again noted. Bilateral pulmonary metastases show mild increase in size. Index nodule in the posterior right lower lobe measures 2.4 cm on image 119/6 compared to 1.8 cm previously. Index nodule in the superior left lower lobe abutting the major fissure measures 2.4 cm on image 60/6 compared to 1.7 cm previously . Musculoskeletal:  No suspicious bone lesions identified. CT ABDOMEN AND PELVIS FINDINGS Hepatobiliary: No masses  identified. Gallbladder is unremarkable. Pancreas:  No mass or inflammatory changes. Spleen:  Within normal limits in size and appearance. Adrenals/Urinary tract: No masses or hydronephrosis. Small benign-appearing bilateral renal cysts remain stable. Stomach/Bowel: No evidence of obstruction, inflammatory process, or abnormal fluid collections. Vascular/Lymphatic: 1.5 cm left external iliac lymph node on image 101/2 shows no significant change compared to previous study. No new or progressive lymphadenopathy identified. Aortic atherosclerosis. No abdominal aortic aneurysm. Reproductive: Prior hysterectomy noted. Adnexal regions are unremarkable in appearance. Other:  None. Musculoskeletal:  No suspicious bone lesions identified. IMPRESSION: Mild increase in mediastinal lymphadenopathy. Stable mild bilateral hilar lymphadenopathy. Mild interval progression of bilateral pulmonary metastases. Stable small left pleural effusion. Stable mild left external iliac lymphadenopathy. No new or progressive metastatic disease within the abdomen or pelvis. Electronically Signed   By: Earle Gell M.D.   On: 09/02/2016 17:01    ASSESSMENT & PLAN:  Endometrial cancer (  [place]) She is doing better with aggressive supportive management. She has gained some weight on steroid treatment. I reinforced importance of her taking mirtazapine. We will proceed with treatment and I will see her back in 10 days for further supportive care and review of toxicity  Antineoplastic chemotherapy induced anemia This is likely due to recent treatment. The patient denies recent history of bleeding such as epistaxis, hematuria or hematochezia. She is asymptomatic from the anemia. I will observe for now.    Moderate malnutrition ([place]) She has recent malignant cachexia with poor oral intake. Her weight is stable. Serum albumin remains low. She was started on mirtazapine and prednisone recently and she felt that appetite has mildly improved. I  recommend she increase oral intake as tolerated   Cancer associated pain She complained of chest wall discomfort. I suspect this is due to cancer pain. She declined pain medicine. I recommend she takes tramadol as needed  Anxiety about health She has significant symptoms of anxiety I recommend she take lorazepam as needed    Orders Placed This Encounter  Procedures  . CBC with Differential    Standing Status:   Standing    Number of Occurrences:   20    Standing Expiration Date:   09/21/2017  . Comprehensive metabolic panel    Standing Status:   Standing    Number of Occurrences:   20    Standing Expiration Date:   09/21/2017  . Hold Tube, Blood Bank    Standing Status:   Standing    Number of Occurrences:   2    Standing Expiration Date:   09/20/2017   All questions were answered. The patient knows to call the clinic with any problems, questions or concerns. No barriers to learning was detected. I spent 25 minutes counseling the patient face to face. The total time spent in the appointment was 30 minutes and more than 50% was on counseling and review of test results     Heath Lark, MD 09/20/2016 5:21 PM

## 2016-09-23 ENCOUNTER — Encounter: Payer: Self-pay | Admitting: Medical Oncology

## 2016-09-23 ENCOUNTER — Telehealth: Payer: Self-pay | Admitting: *Deleted

## 2016-09-23 ENCOUNTER — Telehealth: Payer: Self-pay | Admitting: Medical Oncology

## 2016-09-23 NOTE — Telephone Encounter (Signed)
Called patient for chemo follow up. Denies nausea, vomiting or diarrhea. States she is drinking boost, gatorade and water. Not eating regular food. Trying to drink as much as possible, will call if she is not taking in sufficient amount.

## 2016-09-23 NOTE — Telephone Encounter (Signed)
Letter signed and up front for pickup.

## 2016-09-23 NOTE — Telephone Encounter (Signed)
Request note to be out of work 4/11-5/1 so she can stay with her mother who lives alone . She is undergoing chemo and fell sat. She wants to pick up letter by 12 noon tomorrow because she has to be at work at 2. Letter done and givne to [place] for signature.

## 2016-09-23 NOTE — Telephone Encounter (Signed)
-----   Message from Ignacia Felling, RN sent at 09/20/2016 10:22 AM EDT ----- Regarding: chemo follow up call  Dr. Alvy Bimler 1st abraxane/ Dr. Alvy Bimler   Pt. Phone [phone]

## 2016-09-26 ENCOUNTER — Encounter: Payer: Self-pay | Admitting: Hematology and Oncology

## 2016-09-26 ENCOUNTER — Telehealth: Payer: Self-pay

## 2016-09-26 NOTE — Telephone Encounter (Signed)
Patients daughter called and said she made mistake on her note for work. She needs the date to be 4-10 thru 10-15-16. She is asking that we call her when the note is ready and she has a week to get the note to her job.

## 2016-09-27 ENCOUNTER — Other Ambulatory Visit: Payer: Self-pay | Admitting: Hematology and Oncology

## 2016-09-27 ENCOUNTER — Telehealth: Payer: Self-pay | Admitting: *Deleted

## 2016-09-27 ENCOUNTER — Ambulatory Visit (HOSPITAL_BASED_OUTPATIENT_CLINIC_OR_DEPARTMENT_OTHER): Payer: Medicare Other | Admitting: Nurse Practitioner

## 2016-09-27 VITALS — BP 96/64 | HR 91 | Temp 98.8°F | Resp 18 | Ht 66.5 in | Wt 135.7 lb

## 2016-09-27 DIAGNOSIS — C541 Malignant neoplasm of endometrium: Secondary | ICD-10-CM | POA: Diagnosis not present

## 2016-09-27 DIAGNOSIS — R634 Abnormal weight loss: Secondary | ICD-10-CM | POA: Diagnosis not present

## 2016-09-27 DIAGNOSIS — E46 Unspecified protein-calorie malnutrition: Secondary | ICD-10-CM | POA: Diagnosis present

## 2016-09-27 DIAGNOSIS — C7802 Secondary malignant neoplasm of left lung: Secondary | ICD-10-CM | POA: Diagnosis not present

## 2016-09-27 DIAGNOSIS — C7801 Secondary malignant neoplasm of right lung: Secondary | ICD-10-CM

## 2016-09-27 MED ORDER — SODIUM CHLORIDE 0.9 % IV SOLN
Freq: Once | INTRAVENOUS | Status: AC
Start: 2016-09-27 — End: 2016-09-27
  Administered 2016-09-27: 12:00:00 via INTRAVENOUS

## 2016-09-27 NOTE — Progress Notes (Signed)
RN visit for IV fluids. 

## 2016-09-27 NOTE — Patient Instructions (Signed)
Dehydration, Adult Dehydration is a condition in which there is not enough fluid or water in the body. This happens when you lose more fluids than you take in. Important organs, such as the kidneys, brain, and heart, cannot function without a proper amount of fluids. Any loss of fluids from the body can lead to dehydration. Dehydration can range from mild to severe. This condition should be treated right away to prevent it from becoming severe. What are the causes? This condition may be caused by:  Vomiting.  Diarrhea.  Excessive sweating, such as from heat exposure or exercise.  Not drinking enough fluid, especially:  When ill.  While doing activity that requires a lot of energy.  Excessive urination.  Fever.  Infection.  Certain medicines, such as medicines that cause the body to lose excess fluid (diuretics).  Inability to access safe drinking water.  Reduced physical ability to get adequate water and food. What increases the risk? This condition is more likely to develop in people:  Who have a poorly controlled long-term (chronic) illness, such as diabetes, heart disease, or kidney disease.  Who are age 65 or older.  Who are disabled.  Who live in a place with high altitude.  Who play endurance sports. What are the signs or symptoms? Symptoms of mild dehydration may include:   Thirst.  Dry lips.  Slightly dry mouth.  Dry, warm skin.  Dizziness. Symptoms of moderate dehydration may include:   Very dry mouth.  Muscle cramps.  Dark urine. Urine may be the color of tea.  Decreased urine production.  Decreased tear production.  Heartbeat that is irregular or faster than normal (palpitations).  Headache.  Light-headedness, especially when you stand up from a sitting position.  Fainting (syncope). Symptoms of severe dehydration may include:   Changes in skin, such as:  Cold and clammy skin.  Blotchy (mottled) or pale skin.  Skin that does  not quickly return to normal after being lightly pinched and released (poor skin turgor).  Changes in body fluids, such as:  Extreme thirst.  No tear production.  Inability to sweat when body temperature is high, such as in hot weather.  Very little urine production.  Changes in vital signs, such as:  Weak pulse.  Pulse that is more than 100 beats a minute when sitting still.  Rapid breathing.  Low blood pressure.  Other changes, such as:  Sunken eyes.  Cold hands and feet.  Confusion.  Lack of energy (lethargy).  Difficulty waking up from sleep.  Short-term weight loss.  Unconsciousness. How is this diagnosed? This condition is diagnosed based on your symptoms and a physical exam. Blood and urine tests may be done to help confirm the diagnosis. How is this treated? Treatment for this condition depends on the severity. Mild or moderate dehydration can often be treated at home. Treatment should be started right away. Do not wait until dehydration becomes severe. Severe dehydration is an emergency and it needs to be treated in a hospital. Treatment for mild dehydration may include:   Drinking more fluids.  Replacing salts and minerals in your blood (electrolytes) that you may have lost. Treatment for moderate dehydration may include:   Drinking an oral rehydration solution (ORS). This is a drink that helps you replace fluids and electrolytes (rehydrate). It can be found at pharmacies and retail stores. Treatment for severe dehydration may include:   Receiving fluids through an IV tube.  Receiving an electrolyte solution through a feeding tube that is   passed through your nose and into your stomach (nasogastric tube, or NG tube).  Correcting any abnormalities in electrolytes.  Treating the underlying cause of dehydration. Follow these instructions at home:  If directed by your health care provider, drink an ORS:  Make an ORS by following instructions on the  package.  Start by drinking small amounts, about  cup (120 mL) every 5-10 minutes.  Slowly increase how much you drink until you have taken the amount recommended by your health care provider.  Drink enough clear fluid to keep your urine clear or pale yellow. If you were told to drink an ORS, finish the ORS first, then start slowly drinking other clear fluids. Drink fluids such as:  Water. Do not drink only water. Doing that can lead to having too little salt (sodium) in the body (hyponatremia).  Ice chips.  Fruit juice that you have added water to (diluted fruit juice).  Low-calorie sports drinks.  Avoid:  Alcohol.  Drinks that contain a lot of sugar. These include high-calorie sports drinks, fruit juice that is not diluted, and soda.  Caffeine.  Foods that are greasy or contain a lot of fat or sugar.  Take over-the-counter and prescription medicines only as told by your health care provider.  Do not take sodium tablets. This can lead to having too much sodium in the body (hypernatremia).  Eat foods that contain a healthy balance of electrolytes, such as bananas, oranges, potatoes, tomatoes, and spinach.  Keep all follow-up visits as told by your health care provider. This is important. Contact a health care provider if:  You have abdominal pain that:  Gets worse.  Stays in one area (localizes).  You have a rash.  You have a stiff neck.  You are more irritable than usual.  You are sleepier or more difficult to wake up than usual.  You feel weak or dizzy.  You feel very thirsty.  You have urinated only a small amount of very dark urine over 6-8 hours. Get help right away if:  You have symptoms of severe dehydration.  You cannot drink fluids without vomiting.  Your symptoms get worse with treatment.  You have a fever.  You have a severe headache.  You have vomiting or diarrhea that:  Gets worse.  Does not go away.  You have blood or green matter  (bile) in your vomit.  You have blood in your stool. This may cause stool to look black and tarry.  You have not urinated in 6-8 hours.  You faint.  Your heart rate while sitting still is over 100 beats a minute.  You have trouble breathing. This information is not intended to replace advice given to you by your health care provider. Make sure you discuss any questions you have with your health care provider. Document Released: 06/03/2005 Document Revised: 12/29/2015 Document Reviewed: 07/28/2015 Elsevier Interactive Patient Education  2017 [employer] Inc.  

## 2016-09-27 NOTE — Telephone Encounter (Signed)
Daughter states patient has not been drinking much fluid. Might drink 2 boost per day , ate some pizza. Urine is now orange-red.  Will come in today @ 1100 for IVF

## 2016-09-30 ENCOUNTER — Encounter: Payer: Self-pay | Admitting: Hematology and Oncology

## 2016-09-30 ENCOUNTER — Other Ambulatory Visit: Payer: Self-pay | Admitting: Hematology and Oncology

## 2016-09-30 ENCOUNTER — Ambulatory Visit (HOSPITAL_BASED_OUTPATIENT_CLINIC_OR_DEPARTMENT_OTHER): Payer: Medicare Other

## 2016-09-30 ENCOUNTER — Ambulatory Visit (HOSPITAL_BASED_OUTPATIENT_CLINIC_OR_DEPARTMENT_OTHER): Payer: Medicare Other | Admitting: Hematology and Oncology

## 2016-09-30 ENCOUNTER — Other Ambulatory Visit (HOSPITAL_BASED_OUTPATIENT_CLINIC_OR_DEPARTMENT_OTHER): Payer: Medicare Other

## 2016-09-30 ENCOUNTER — Ambulatory Visit: Payer: Medicare Other

## 2016-09-30 ENCOUNTER — Ambulatory Visit (HOSPITAL_COMMUNITY)
Admission: RE | Admit: 2016-09-30 | Discharge: 2016-09-30 | Disposition: A | Discharge status: Home / Self Care | Payer: Medicare Other | Source: Ambulatory Visit | Attending: Hematology and Oncology | Admitting: Hematology and Oncology

## 2016-09-30 VITALS — BP 84/63 | HR 84 | Temp 97.7°F | Resp 16

## 2016-09-30 VITALS — BP 85/58 | HR 92 | Temp 97.8°F | Resp 18 | Ht 66.0 in | Wt 133.2 lb

## 2016-09-30 DIAGNOSIS — C541 Malignant neoplasm of endometrium: Secondary | ICD-10-CM

## 2016-09-30 DIAGNOSIS — Z5111 Encounter for antineoplastic chemotherapy: Secondary | ICD-10-CM

## 2016-09-30 DIAGNOSIS — C7801 Secondary malignant neoplasm of right lung: Secondary | ICD-10-CM

## 2016-09-30 DIAGNOSIS — G893 Neoplasm related pain (acute) (chronic): Secondary | ICD-10-CM | POA: Diagnosis not present

## 2016-09-30 DIAGNOSIS — E440 Moderate protein-calorie malnutrition: Secondary | ICD-10-CM | POA: Diagnosis not present

## 2016-09-30 DIAGNOSIS — D6481 Anemia due to antineoplastic chemotherapy: Secondary | ICD-10-CM | POA: Diagnosis not present

## 2016-09-30 DIAGNOSIS — R64 Cachexia: Secondary | ICD-10-CM | POA: Diagnosis not present

## 2016-09-30 DIAGNOSIS — C7800 Secondary malignant neoplasm of unspecified lung: Secondary | ICD-10-CM

## 2016-09-30 DIAGNOSIS — T451X5A Adverse effect of antineoplastic and immunosuppressive drugs, initial encounter: Secondary | ICD-10-CM

## 2016-09-30 DIAGNOSIS — D630 Anemia in neoplastic disease: Secondary | ICD-10-CM | POA: Diagnosis present

## 2016-09-30 DIAGNOSIS — J90 Pleural effusion, not elsewhere classified: Secondary | ICD-10-CM | POA: Diagnosis not present

## 2016-09-30 DIAGNOSIS — C7802 Secondary malignant neoplasm of left lung: Secondary | ICD-10-CM

## 2016-09-30 DIAGNOSIS — D472 Monoclonal gammopathy: Secondary | ICD-10-CM

## 2016-09-30 LAB — CBC WITH DIFFERENTIAL/PLATELET
BASO%: 1.1 % (ref 0.0–2.0)
Basophils Absolute: 0.1 10*3/uL (ref 0.0–0.1)
EOS ABS: 0 10*3/uL (ref 0.0–0.5)
EOS%: 0.7 % (ref 0.0–7.0)
HCT: 24.7 % — ABNORMAL LOW (ref 34.8–46.6)
HEMOGLOBIN: 8.1 g/dL — AB (ref 11.6–15.9)
LYMPH%: 19.9 % (ref 14.0–49.7)
MCH: 31.8 pg (ref 25.1–34.0)
MCHC: 32.8 g/dL (ref 31.5–36.0)
MCV: 96.8 fL (ref 79.5–101.0)
MONO#: 0.5 10*3/uL (ref 0.1–0.9)
MONO%: 10.1 % (ref 0.0–14.0)
NEUT%: 68.2 % (ref 38.4–76.8)
NEUTROS ABS: 3.5 10*3/uL (ref 1.5–6.5)
PLATELETS: 390 10*3/uL (ref 145–400)
RBC: 2.56 10*6/uL — ABNORMAL LOW (ref 3.70–5.45)
RDW: 19.7 % — AB (ref 11.2–14.5)
WBC: 5.2 10*3/uL (ref 3.9–10.3)
lymph#: 1 10*3/uL (ref 0.9–3.3)

## 2016-09-30 LAB — COMPREHENSIVE METABOLIC PANEL
ALBUMIN: 2.2 g/dL — AB (ref 3.5–5.0)
ALK PHOS: 256 U/L — AB (ref 40–150)
ALT: 48 U/L (ref 0–55)
AST: 34 U/L (ref 5–34)
Anion Gap: 10 mEq/L (ref 3–11)
BILIRUBIN TOTAL: 0.49 mg/dL (ref 0.20–1.20)
BUN: 10.2 mg/dL (ref 7.0–26.0)
CO2: 25 meq/L (ref 22–29)
CREATININE: 0.7 mg/dL (ref 0.6–1.1)
Calcium: 9.8 mg/dL (ref 8.4–10.4)
Chloride: 103 mEq/L (ref 98–109)
EGFR: >90 mL/min/{1.73_m2} (ref >90)
GLUCOSE: 144 mg/dL — AB (ref 70–140)
Potassium: 4.3 mEq/L (ref 3.5–5.1)
SODIUM: 138 meq/L (ref 136–145)
TOTAL PROTEIN: 8.9 g/dL — AB (ref 6.4–8.3)

## 2016-09-30 LAB — PREPARE RBC (CROSSMATCH)

## 2016-09-30 MED ORDER — FUROSEMIDE 10 MG/ML IJ SOLN
INTRAMUSCULAR | Status: AC
  Filled 2016-09-30: qty 2

## 2016-09-30 MED ORDER — PROCHLORPERAZINE MALEATE 10 MG PO TABS
10.0000 mg | ORAL_TABLET | Freq: Once | ORAL | Status: AC
  Administered 2016-09-30: 10 mg via ORAL

## 2016-09-30 MED ORDER — DIPHENHYDRAMINE HCL 25 MG PO CAPS: 25.0000 mg | ORAL_CAPSULE | Freq: Once | ORAL | Status: DC

## 2016-09-30 MED ORDER — SODIUM CHLORIDE 0.9% FLUSH
10.0000 mL | INTRAVENOUS | Status: DC | PRN
  Administered 2016-09-30: 10 mL
  Filled 2016-09-30: qty 10

## 2016-09-30 MED ORDER — FUROSEMIDE 10 MG/ML IJ SOLN
20.0000 mg | Freq: Once | INTRAMUSCULAR | Status: AC
Start: 2016-09-30 — End: 2016-09-30
  Administered 2016-09-30: 20 mg via INTRAVENOUS

## 2016-09-30 MED ORDER — ACETAMINOPHEN 325 MG PO TABS
ORAL_TABLET | ORAL | Status: AC
  Filled 2016-09-30: qty 2

## 2016-09-30 MED ORDER — ACETAMINOPHEN 325 MG PO TABS
650.0000 mg | ORAL_TABLET | Freq: Once | ORAL | Status: AC
  Administered 2016-09-30: 650 mg via ORAL

## 2016-09-30 MED ORDER — HEPARIN SOD (PORK) LOCK FLUSH 100 UNIT/ML IV SOLN
500.0000 [IU] | Freq: Once | INTRAVENOUS | Status: AC | PRN
  Administered 2016-09-30: 500 [IU]
  Filled 2016-09-30: qty 5

## 2016-09-30 MED ORDER — PACLITAXEL PROTEIN-BOUND CHEMO INJECTION 100 MG
75.0000 mg/m2 | Freq: Once | INTRAVENOUS | Status: AC
  Administered 2016-09-30: 125 mg via INTRAVENOUS
  Filled 2016-09-30: qty 25

## 2016-09-30 MED ORDER — SODIUM CHLORIDE 0.9 % IV SOLN
Freq: Once | INTRAVENOUS | Status: AC
  Administered 2016-09-30: 13:00:00 via INTRAVENOUS

## 2016-09-30 NOTE — Patient Instructions (Signed)
Implanted Port Home Guide An implanted port is a type of central line that is placed under the skin. Central lines are used to provide IV access when treatment or nutrition needs to be given through a person's veins. Implanted ports are used for long-term IV access. An implanted port may be placed because:  You need IV medicine that would be irritating to the small veins in your hands or arms.  You need long-term IV medicines, such as antibiotics.  You need IV nutrition for a long period.  You need frequent blood draws for lab tests.  You need dialysis.  Implanted ports are usually placed in the chest area, but they can also be placed in the upper arm, the abdomen, or the leg. An implanted port has two main parts:  Reservoir. The reservoir is round and will appear as a small, raised area under your skin. The reservoir is the part where a needle is inserted to give medicines or draw blood.  Catheter. The catheter is a thin, flexible tube that extends from the reservoir. The catheter is placed into a large vein. Medicine that is inserted into the reservoir goes into the catheter and then into the vein.  How will I care for my incision site? Do not get the incision site wet. Bathe or shower as directed by your health care provider. How is my port accessed? Special steps must be taken to access the port:  Before the port is accessed, a numbing cream can be placed on the skin. This helps numb the skin over the port site.  Your health care provider uses a sterile technique to access the port. ? Your health care provider must put on a mask and sterile gloves. ? The skin over your port is cleaned carefully with an antiseptic and allowed to dry. ? The port is gently pinched between sterile gloves, and a needle is inserted into the port.  Only "non-coring" port needles should be used to access the port. Once the port is accessed, a blood return should be checked. This helps ensure that the port  is in the vein and is not clogged.  If your port needs to remain accessed for a constant infusion, a clear (transparent) bandage will be placed over the needle site. The bandage and needle will need to be changed every week, or as directed by your health care provider.  Keep the bandage covering the needle clean and dry. Do not get it wet. Follow your health care provider's instructions on how to take a shower or bath while the port is accessed.  If your port does not need to stay accessed, no bandage is needed over the port.  What is flushing? Flushing helps keep the port from getting clogged. Follow your health care provider's instructions on how and when to flush the port. Ports are usually flushed with saline solution or a medicine called heparin. The need for flushing will depend on how the port is used.  If the port is used for intermittent medicines or blood draws, the port will need to be flushed: ? After medicines have been given. ? After blood has been drawn. ? As part of routine maintenance.  If a constant infusion is running, the port may not need to be flushed.  How long will my port stay implanted? The port can stay in for as long as your health care provider thinks it is needed. When it is time for the port to come out, surgery will be   done to remove it. The procedure is similar to the one performed when the port was put in. When should I seek immediate medical care? When you have an implanted port, you should seek immediate medical care if:  You notice a bad smell coming from the incision site.  You have swelling, redness, or drainage at the incision site.  You have more swelling or pain at the port site or the surrounding area.  You have a fever that is not controlled with medicine.  This information is not intended to replace advice given to you by your health care provider. Make sure you discuss any questions you have with your health care provider. Document  Released: 06/03/2005 Document Revised: 11/09/2015 Document Reviewed: 02/08/2013 [employer] Interactive Patient Education  2017 [employer]  

## 2016-09-30 NOTE — Progress Notes (Signed)
Ok to treat with B/P today per MD Alvy Bimler

## 2016-09-30 NOTE — Progress Notes (Signed)
[place] OFFICE PROGRESS NOTE  Patient Care Team: Jacquelyne Balint, MD as PCP - General (Gynecologic Oncology)  SUMMARY OF ONCOLOGIC HISTORY:   Endometrial cancer [facility])   03/07/2014 Imaging    CT scan showed large uterine mass      03/07/2014 Initial Diagnosis    Surgery by Dr Jacquelyne Balint at [facility] in [place] on 04-06-14 TAH, BSO, omentectomy, pelvic and right common iliac node evaluation. 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 Bulk of the carcinoma was in lower uterine segment where it was within 0.1 cm of serosal surface. Cytology positive for adenocarcinoma on washings ([id number]). Surgical findings were significant for no paraaortic adenopathy apparent. Patient received 3 cycles of adjuvant chemotherapy in [place] by Dr Sabra Heck from11-19-15 thru2-12-16, with neulasta      08/15/2014 Imaging    Interval hysterectomy. 2. Retroperitoneal low-density structures which are suspicious for necrotic adenopathy/metastasis. Left periaortic component is slightly decreased in size. However, there is new soft tissue thickening about the abdominal aorta more superiorly. Less likely differential considerations include benign lymphangioma/lymphangiomas and/or concurrent vasculitis. Consider PET for further evaluation. 3. Development of bilateral pleural effusions with left base atelectasis. 4. Indeterminate bibasilar pulmonary nodules, felt to be similar. Consider further evaluation with chest CT, as recommended on 03/25/2014. 5.  Possible constipation.      08/24/2014 - 10/21/2014 Chemotherapy    She received 2 cycles of carbo/taxol. Cycle 3 with [place] only       11/10/2014  Imaging    Prior periaortic lucency of concern at the hiatus has resolved and was probably transient and inflammatory or due to third spacing of fluid. The pericardial effusion and pleural effusions have also resolved. 2. Upper normal sized lymph node just above the right inferior pulmonary vein is new compared to the prior exam and may merit Observation. 3. Tiny scattered pulmonary nodules remain stable. 4.  Prominent stool throughout the colon favors constipation. 5.  Aortoiliac atherosclerotic vascular disease. 6. Degenerative arthropathy of the hips and degenerative disc disease in the lumbar spine. 7. Small ventral supraumbilical hernia contains adipose tissue. 8. Emphysema. 9. Left anterior descending coronary artery atherosclerosis. 10. Ascending thoracic aortic aneurysm.       12/25/2014 Imaging    No acute findings within the abdomen or pelvis. No evidence of recurrent or metastatic carcinoma. Large stool burden again demonstrated; suggest clinical correlation for possible constipation.       01/03/2015 - 01/26/2015 Radiation Therapy    July 19, July 27, July 29, August 9, August 11 Site/dose: Proximal vagina, 30 gray in 5 fractions      11/30/2015 Imaging    Significant progression of disease, as detailed above. This includes numerous pulmonary masses/nodules within the right lung, largest of which is at the right lung base measuring 3.1 x 2.6 cm. A new pleural based mass is now seen along the medial aspects of the left upper lobe measuring 2.6 x 1.7 cm. On earlier chest CT of 11/04/2014, the largest pulmonary nodule identified within the left lung was 6 mm and the largest pulmonary nodule identified within the right lung was 4 mm. 2. Mediastinal and perihilar lymphadenopathy, most numerous within the left lower paratracheal and aortopulmonary window regions, with largest lymph  nodes in the right perihilar region demonstrating evidence of central necrosis, almost certainly compatible with  metastatic lymphadenopathy throughout and further evidence of progression of disease. 3. Large left pleural effusion, likely malignant effusion, with adjacent compressive atelectasis. Heart is displaced to the right by the large left pleural effusion. 4. Emphysematous change, upper lobe predominant. 5. No definite evidence of osseous metastasis. Scattered tiny lucent foci within the thoracic vertebral bodies could conceivably represent early developing metastases.      11/30/2015 - 12/06/2015 Hospital Admission    She was admitted for severe shortness of breath and was found to have malignant effusion      12/01/2015 Imaging    As seen on yesterday's chest CT, there is metastatic disease to both lung bases with a large malignant left pleural effusion. There are several pleural-based metastases on the left and right infrahilar lymphadenopathy, incompletely visualized. 2. Left periaortic and left pelvic side wall lymphadenopathy consistent with metastatic disease. No other evidence of abdominal  pelvic metastatic disease. 3. No hydronephrosis.      12/04/2015 Procedure    Successful placement of a tunneled left pleural catheter with ultrasound and fluoroscopic guidance.       12/22/2015 Pathology Results    Pleura, peel, Left pleural peel - RARE MALIGNANT CELLS. - SEE MICROSCOPIC DESCRIPTION Microscopic Comment The specimen consists of abundant fibrin with rare minute aggregates of epithelial cells with malignant features consistent with poorly differentiated carcinoma.       12/22/2015 - 12/26/2015 Hospital Admission    She was admitted for management of malignant pleural effusion      12/22/2015 Surgery    OPERATION:  Left VATS (video-assisted thoracoscopic surgery) with drainage of loculated malignant effusion, chemical pleurodesis with doxycycline, placement of PleurX catheter.  PREOPERATIVE DIAGNOSES:  Loculated left malignant effusion, history of endometrial carcinoma, advanced  age.  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      01/04/2016 - 05/16/2016 Chemotherapy    She received carboplatin & Taxol       03/19/2016 Imaging    Interval response to therapy. 2. Decrease in volume of left pleural effusion. The index lesions within the chest have decreased in size in the interval. 3. Interval decrease in size of retroperitoneal and left pelvic side wall adenopathy. 4. Aortic atherosclerosis.       06/21/2016 Imaging    Apparent interval mixed response to therapy. Pleural disease in the chest has progressed along with progression of mediastinal lymphadenopathy. Some pulmonary nodules progressed while others have decreased in size in the interval. The index abdominal retroperitoneal left pelvic sidewall lymph nodes have decreased in the interval. 2. Persistent small left pleural effusion with posterior left base collapse/consolidation. 3.  Abdominal Aortic Atherosclerois       07/10/2016 Imaging    Left ventricle: The cavity size was normal. Wall thickness was increased in a pattern of mild LVH. Systolic function was normal. The estimated ejection fraction was in the range of 60% to 65%. Wall motion was normal; there were no regional wall motion abnormalities. Doppler parameters are consistent with abnormal left ventricular relaxation (grade 1 diastolic dysfunction).       07/11/2016 - 08/08/2016 Chemotherapy    She received Doxil only       07/25/2016 Adverse Reaction    She felt dehydrated, dizzy, had mucositis, constipated and miserable      09/02/2016 Imaging    Mild increase in mediastinal lymphadenopathy. Stable  mild bilateral hilar lymphadenopathy. Mild interval progression of bilateral pulmonary metastases. Stable small left pleural effusion. Stable mild left external iliac lymphadenopathy. No new or progressive metastatic disease within the abdomen or  pelvis      09/20/2016 -  Chemotherapy    The patient had palliative chemo with Abraxane       INTERVAL HISTORY: Please see below for problem oriented charting. She has progressive weight loss Her appetite is poor and she declined eating She denies recent nausea or vomiting No constipation She denies pain Denies peripheral neuropathy The patient denies any recent signs or symptoms of bleeding such as spontaneous epistaxis, hematuria or hematochezia. She complain of profound fatigue  REVIEW OF SYSTEMS:   Constitutional: Denies fevers, chills  Eyes: Denies blurriness of vision Ears, nose, mouth, throat, and face: Denies mucositis or sore throat Respiratory: Denies cough, dyspnea or wheezes Cardiovascular: Denies palpitation, chest discomfort or lower extremity swelling Gastrointestinal:  Denies nausea, heartburn or change in bowel habits Skin: Denies abnormal skin rashes Lymphatics: Denies new lymphadenopathy or easy bruising Neurological:Denies numbness, tingling or new weaknesses Behavioral/Psych: Mood is stable, no new changes  All other systems were reviewed with the patient and are negative.  I have reviewed the past medical history, past surgical history, social history and family history with the patient and they are unchanged from previous note.  ALLERGIES:  is allergic to carboplatin and levaquin [levofloxacin in d5w].  MEDICATIONS:  Current Outpatient Prescriptions  Medication Sig Dispense Refill  . Calcium Citrate-Vitamin D (CALCIUM + D PO) Take 1 tablet by mouth daily.    . cholecalciferol (VITAMIN D) 1000 units tablet Take 1,000 Units by mouth daily.    . Cyanocobalamin (VITAMIN B-12 PO) Take 1 tablet by mouth daily.    . ferrous sulfate 325 (65 FE) MG tablet Take 325 mg by mouth daily.    . fludrocortisone (FLORINEF) 0.1 MG tablet Take 1 tablet (0.1 mg total) by mouth daily. 90 tablet 3  . loratadine (CLARITIN) 10 MG tablet Take 10 mg by mouth daily as needed  for allergies.    Marland Kitchen LORazepam (ATIVAN) 0.5 MG tablet Take 1 tablet (0.5 mg total) by mouth 2 (two) times daily as needed for anxiety. 30 tablet 0  . Multiple Vitamin (MULTI-VITAMINS) TABS Take 1 tablet by mouth daily.    . naproxen sodium (ANAPROX) 220 MG tablet Take 220 mg by mouth 2 (two) times daily as needed.    . polyethylene glycol (MIRALAX / GLYCOLAX) packet Take 17 g by mouth daily as needed for mild constipation.     . traMADol (ULTRAM) 50 MG tablet Take 1 tablet (50 mg total) by mouth every 6 (six) hours as needed. 90 tablet 0  . mirtazapine (REMERON) 15 MG tablet Take 1 tablet (15 mg total) by mouth at bedtime. (Patient not taking: Reported on 09/20/2016) 90 tablet 1  . PROAIR HFA 108 (90 Base) MCG/ACT inhaler INHALE 1 PUFF BY MOUTH FOUR TIMES DAILY (Patient not taking: Reported on 09/11/2016) 8.5 g 0  . promethazine (PHENERGAN) 12.5 MG tablet Take 1 tablet (12.5 mg total) by mouth every 6 (six) hours as needed for nausea or vomiting. (Patient not taking: Reported on 09/11/2016) 30 tablet 0   No current facility-administered medications for this visit.    Facility-Administered Medications Ordered in Other Visits  Medication Dose Route Frequency Provider Last Rate Last Dose  . sodium chloride 0.9 % injection 10 mL  10 mL Intravenous PRN Lennis Marion Downer, MD   10  mL at 02/15/16 1601    PHYSICAL EXAMINATION: ECOG PERFORMANCE STATUS: 2 - Symptomatic, <50% confined to bed  Vitals:   09/30/16 1114  BP: (!) 85/58  Pulse: 92  Resp: 18  Temp: 97.8 F (36.6 C)   Filed Weights   09/30/16 1114  Weight: 133 lb 3.2 oz (60.4 kg)    GENERAL:alert, no distress and comfortable.  She looks thin and cachectic SKIN: skin color, texture, turgor are normal, no rashes or significant lesions EYES: normal, Conjunctiva are pale and non-injected, sclera clear OROPHARYNX:no exudate, no erythema and lips, buccal mucosa, and tongue normal  NECK: supple, thyroid normal size, non-tender, without  nodularity LYMPH:  no palpable lymphadenopathy in the cervical, axillary or inguinal LUNGS: clear to auscultation and percussion with normal breathing effort HEART: regular rate & rhythm and no murmurs and no lower extremity edema ABDOMEN:abdomen soft, non-tender and normal bowel sounds Musculoskeletal:no cyanosis of digits and no clubbing  NEURO: alert & oriented x 3 with fluent speech, no focal motor/sensory deficits  LABORATORY DATA:  I have reviewed the data as listed    Component Value Date/Time   NA 138 09/30/2016 1048   K 4.3 09/30/2016 1048   CL 101 12/24/2015 0639   CO2 25 09/30/2016 1048   GLUCOSE 144 (H) 09/30/2016 1048   BUN 10.2 09/30/2016 1048   CREATININE 0.7 09/30/2016 1048   CALCIUM 9.8 09/30/2016 1048   PROT 8.9 (H) 09/30/2016 1048   ALBUMIN 2.2 (L) 09/30/2016 1048   AST 34 09/30/2016 1048   ALT 48 09/30/2016 1048   ALKPHOS 256 (H) 09/30/2016 1048   BILITOT 0.49 09/30/2016 1048   GFRNONAA >60 12/24/2015 0639   GFRAA >60 12/24/2015 0639    No results found for: SPEP, UPEP  Lab Results  Component Value Date   WBC 5.2 09/30/2016   NEUTROABS 3.5 09/30/2016   HGB 8.1 (L) 09/30/2016   HCT 24.7 (L) 09/30/2016   MCV 96.8 09/30/2016   PLT 390 09/30/2016      Chemistry      Component Value Date/Time   NA 138 09/30/2016 1048   K 4.3 09/30/2016 1048   CL 101 12/24/2015 0639   CO2 25 09/30/2016 1048   BUN 10.2 09/30/2016 1048   CREATININE 0.7 09/30/2016 1048      Component Value Date/Time   CALCIUM 9.8 09/30/2016 1048   ALKPHOS 256 (H) 09/30/2016 1048   AST 34 09/30/2016 1048   ALT 48 09/30/2016 1048   BILITOT 0.49 09/30/2016 1048       RADIOGRAPHIC STUDIES: I have personally reviewed the radiological images as listed and agreed with the findings in the report. Ct Chest W Contrast  Result Date: 09/02/2016 CLINICAL DATA:  Followup metastatic endometrial carcinoma. Ongoing chemotherapy. Restaging. EXAM: CT CHEST, ABDOMEN, AND PELVIS WITH CONTRAST  TECHNIQUE: Multidetector CT imaging of the chest, abdomen and pelvis was performed following the standard protocol during bolus administration of intravenous contrast. CONTRAST:  100 mL ISOVUE-300 IOPAMIDOL (ISOVUE-300) INJECTION 61% COMPARISON:  06/21/2016 FINDINGS: CT CHEST FINDINGS Cardiovascular: No acute findings. Stable 4.0 cm ascending thoracic aortic aneurysm. Mediastinum/Lymph Nodes: Mediastinal lymphadenopathy shows mild increase in size since previous study. Adenopathy in the lateral aortic region measures 2.5 x 3.7 cm on image 27/ 2 compared to 1.9 x 3.3 cm previously. Subcarinal lymphadenopathy measures 2.2 cm short axis on image 33/ 2 compared to 1.8 cm previously. Mild bilateral hilar lymphadenopathy shows no significant change. Lungs/Pleura: Small left pleural effusion shows no significant change. Moderate emphysema again  noted. Bilateral pulmonary metastases show mild increase in size. Index nodule in the posterior right lower lobe measures 2.4 cm on image 119/6 compared to 1.8 cm previously. Index nodule in the superior left lower lobe abutting the major fissure measures 2.4 cm on image 60/6 compared to 1.7 cm previously . Musculoskeletal:  No suspicious bone lesions identified. CT ABDOMEN AND PELVIS FINDINGS Hepatobiliary: No masses identified. Gallbladder is unremarkable. Pancreas:  No mass or inflammatory changes. Spleen:  Within normal limits in size and appearance. Adrenals/Urinary tract: No masses or hydronephrosis. Small benign-appearing bilateral renal cysts remain stable. Stomach/Bowel: No evidence of obstruction, inflammatory process, or abnormal fluid collections. Vascular/Lymphatic: 1.5 cm left external iliac lymph node on image 101/2 shows no significant change compared to previous study. No new or progressive lymphadenopathy identified. Aortic atherosclerosis. No abdominal aortic aneurysm. Reproductive: Prior hysterectomy noted. Adnexal regions are unremarkable in appearance. Other:   None. Musculoskeletal:  No suspicious bone lesions identified. IMPRESSION: Mild increase in mediastinal lymphadenopathy. Stable mild bilateral hilar lymphadenopathy. Mild interval progression of bilateral pulmonary metastases. Stable small left pleural effusion. Stable mild left external iliac lymphadenopathy. No new or progressive metastatic disease within the abdomen or pelvis. Electronically Signed   By: Earle Gell M.D.   On: 09/02/2016 17:01   Ct Abdomen Pelvis W Contrast  Result Date: 09/02/2016 CLINICAL DATA:  Followup metastatic endometrial carcinoma. Ongoing chemotherapy. Restaging. EXAM: CT CHEST, ABDOMEN, AND PELVIS WITH CONTRAST TECHNIQUE: Multidetector CT imaging of the chest, abdomen and pelvis was performed following the standard protocol during bolus administration of intravenous contrast. CONTRAST:  100 mL ISOVUE-300 IOPAMIDOL (ISOVUE-300) INJECTION 61% COMPARISON:  06/21/2016 FINDINGS: CT CHEST FINDINGS Cardiovascular: No acute findings. Stable 4.0 cm ascending thoracic aortic aneurysm. Mediastinum/Lymph Nodes: Mediastinal lymphadenopathy shows mild increase in size since previous study. Adenopathy in the lateral aortic region measures 2.5 x 3.7 cm on image 27/ 2 compared to 1.9 x 3.3 cm previously. Subcarinal lymphadenopathy measures 2.2 cm short axis on image 33/ 2 compared to 1.8 cm previously. Mild bilateral hilar lymphadenopathy shows no significant change. Lungs/Pleura: Small left pleural effusion shows no significant change. Moderate emphysema again noted. Bilateral pulmonary metastases show mild increase in size. Index nodule in the posterior right lower lobe measures 2.4 cm on image 119/6 compared to 1.8 cm previously. Index nodule in the superior left lower lobe abutting the major fissure measures 2.4 cm on image 60/6 compared to 1.7 cm previously . Musculoskeletal:  No suspicious bone lesions identified. CT ABDOMEN AND PELVIS FINDINGS Hepatobiliary: No masses identified. Gallbladder  is unremarkable. Pancreas:  No mass or inflammatory changes. Spleen:  Within normal limits in size and appearance. Adrenals/Urinary tract: No masses or hydronephrosis. Small benign-appearing bilateral renal cysts remain stable. Stomach/Bowel: No evidence of obstruction, inflammatory process, or abnormal fluid collections. Vascular/Lymphatic: 1.5 cm left external iliac lymph node on image 101/2 shows no significant change compared to previous study. No new or progressive lymphadenopathy identified. Aortic atherosclerosis. No abdominal aortic aneurysm. Reproductive: Prior hysterectomy noted. Adnexal regions are unremarkable in appearance. Other:  None. Musculoskeletal:  No suspicious bone lesions identified. IMPRESSION: Mild increase in mediastinal lymphadenopathy. Stable mild bilateral hilar lymphadenopathy. Mild interval progression of bilateral pulmonary metastases. Stable small left pleural effusion. Stable mild left external iliac lymphadenopathy. No new or progressive metastatic disease within the abdomen or pelvis. Electronically Signed   By: Earle Gell M.D.   On: 09/02/2016 17:01    ASSESSMENT & PLAN:  Endometrial cancer [facility]) The patient has progressive decline since the last  time I saw her She does not want to take steroid treatment for malignant cachexia  We will proceed with treatment today as scheduled I will see her back again before her next treatment    Antineoplastic chemotherapy induced anemia We discussed some of the risks, benefits, and alternatives of blood transfusions. The patient is symptomatic from anemia and the hemoglobin level is critically low.  Some of the side-effects to be expected including risks of transfusion reactions, chills, infection, syndrome of volume overload and risk of hospitalization from various reasons and the patient is willing to proceed and went ahead to sign consent today. I recommend 2 units of blood transfusion With her significant anemia problem, I  am wondering whether we could be missing another bone marrow disorder such as multiple myeloma I will order further workup for this  Cachexia ([place]) Due to disease and recent chemo I encouraged her to increase oral intake as tolerated She is currently on Remeron She declined steroid treatment  Cancer associated pain She complained of chest wall discomfort. I suspect this is due to cancer pain. She declined pain medicine. I recommend she takes tramadol as needed  Moderate malnutrition ([place]) She has recent malignant cachexia with poor oral intake. Her weight has declined Serum albumin remains low. She was started on mirtazapine recently and she felt that appetite has mildly improved. I recommend she increase oral intake as tolerated    No orders of the defined types were placed in this encounter.  All questions were answered. The patient knows to call the clinic with any problems, questions or concerns. No barriers to learning was detected. I spent 25 minutes counseling the patient face to face. The total time spent in the appointment was 40 minutes and more than 50% was on counseling and review of test results     Heath Lark, MD 09/30/2016 1:16 PM

## 2016-09-30 NOTE — Assessment & Plan Note (Signed)
The patient has progressive decline since the last time I saw her She does not want to take steroid treatment for malignant cachexia  We will proceed with treatment today as scheduled I will see her back again before her next treatment

## 2016-09-30 NOTE — Patient Instructions (Signed)
[facility] Discharge Instructions for Patients Receiving Chemotherapy  Today you received the following chemotherapy agents Abraxane   To help prevent nausea and vomiting after your treatment, we encourage you to take your nausea medication as directed.    If you develop nausea and vomiting that is not controlled by your nausea medication, call the clinic.   BELOW ARE SYMPTOMS THAT SHOULD BE REPORTED IMMEDIATELY:  *FEVER GREATER THAN 100.5 F  *CHILLS WITH OR WITHOUT FEVER  NAUSEA AND VOMITING THAT IS NOT CONTROLLED WITH YOUR NAUSEA MEDICATION  *UNUSUAL SHORTNESS OF BREATH  *UNUSUAL BRUISING OR BLEEDING  TENDERNESS IN MOUTH AND THROAT WITH OR WITHOUT PRESENCE OF ULCERS  *URINARY PROBLEMS  *BOWEL PROBLEMS  UNUSUAL RASH Items with * indicate a potential emergency and should be followed up as soon as possible.  Feel free to call the clinic you have any questions or concerns. The clinic phone number is [phone].  Please show the [facility] at check-in to the Emergency Department and triage nurse.   Blood Transfusion, Adult, Care After This sheet gives you information about how to care for yourself after your procedure. Your health care provider may also give you more specific instructions. If you have problems or questions, contact your health care provider. What can I expect after the procedure? After your procedure, it is common to have:  Bruising and soreness where the IV tube was inserted.  Headache. Follow these instructions at home:  Take over-the-counter and prescription medicines only as told by your health care provider.  Return to your normal activities as told by your health care provider.  Follow instructions from your health care provider about how to take care of your IV insertion site. Make sure you:  Wash your hands with soap and water before you change your bandage (dressing). If soap and water are not available, use hand  sanitizer.  Change your dressing as told by your health care provider.  Check your IV insertion site every day for signs of infection. Check for:  More redness, swelling, or pain.  More fluid or blood.  Warmth.  Pus or a bad smell. Contact a health care provider if:  You have more redness, swelling, or pain around the IV insertion site.  You have more fluid or blood coming from the IV insertion site.  Your IV insertion site feels warm to the touch.  You have pus or a bad smell coming from the IV insertion site.  Your urine turns pink, red, or brown.  You feel weak after doing your normal activities. Get help right away if:  You have signs of a serious allergic or immune system reaction, including:  Itchiness.  Hives.  Trouble breathing.  Anxiety.  Chest or lower back pain.  Fever, flushing, and chills.  Rapid pulse.  Rash.  Diarrhea.  Vomiting.  Dark urine.  Serious headache.  Dizziness.  Stiff neck.  Yellow coloration of the face or the white parts of the eyes (jaundice). This information is not intended to replace advice given to you by your health care provider. Make sure you discuss any questions you have with your health care provider. Document Released: 06/24/2014 Document Revised: 01/31/2016 Document Reviewed: 12/18/2015 Elsevier Interactive Patient Education  2017 [employer].

## 2016-09-30 NOTE — Assessment & Plan Note (Signed)
She complained of chest wall discomfort. I suspect this is due to cancer pain. She declined pain medicine. I recommend she takes tramadol as needed

## 2016-09-30 NOTE — Assessment & Plan Note (Signed)
Due to disease and recent chemo I encouraged her to increase oral intake as tolerated She is currently on Remeron She declined steroid treatment

## 2016-09-30 NOTE — Assessment & Plan Note (Signed)
She has recent malignant cachexia with poor oral intake. Her weight has declined Serum albumin remains low. She was started on mirtazapine recently and she felt that appetite has mildly improved. I recommend she increase oral intake as tolerated

## 2016-09-30 NOTE — Assessment & Plan Note (Signed)
We discussed some of the risks, benefits, and alternatives of blood transfusions. The patient is symptomatic from anemia and the hemoglobin level is critically low.  Some of the side-effects to be expected including risks of transfusion reactions, chills, infection, syndrome of volume overload and risk of hospitalization from various reasons and the patient is willing to proceed and went ahead to sign consent today. I recommend 2 units of blood transfusion With her significant anemia problem, I am wondering whether we could be missing another bone marrow disorder such as multiple myeloma I will order further workup for this

## 2016-10-01 LAB — KAPPA/LAMBDA LIGHT CHAINS
IG KAPPA FREE LIGHT CHAIN: 86.6 mg/L — AB (ref 3.3–19.4)
IG LAMBDA FREE LIGHT CHAIN: 65.4 mg/L — AB (ref 5.7–26.3)
KAPPA/LAMBDA FLC RATIO: 1.32 (ref 0.26–1.65)

## 2016-10-02 LAB — BPAM RBC
BLOOD PRODUCT EXPIRATION DATE: 201805102359
BLOOD PRODUCT EXPIRATION DATE: 201805102359
ISSUE DATE / TIME: 201804161503
ISSUE DATE / TIME: 201804161503
UNIT TYPE AND RH: 5100
Unit Type and Rh: 5100

## 2016-10-02 LAB — TYPE AND SCREEN
ABO/RH(D): O POS
Antibody Screen: NEGATIVE
Unit division: 0
Unit division: 0

## 2016-10-03 LAB — MULTIPLE MYELOMA PANEL, SERUM
ALBUMIN SERPL ELPH-MCNC: 2.5 g/dL — AB (ref 2.9–4.4)
ALPHA 1: 0.6 g/dL — AB (ref 0.0–0.4)
ALPHA2 GLOB SERPL ELPH-MCNC: 1.3 g/dL — AB (ref 0.4–1.0)
Albumin/Glob SerPl: 0.5 — ABNORMAL LOW (ref 0.7–1.7)
B-Globulin SerPl Elph-Mcnc: 1.4 g/dL — ABNORMAL HIGH (ref 0.7–1.3)
Gamma Glob SerPl Elph-Mcnc: 2.2 g/dL — ABNORMAL HIGH (ref 0.4–1.8)
Globulin, Total: 5.5 g/dL — ABNORMAL HIGH (ref 2.2–3.9)
IGM (IMMUNOGLOBIN M), SRM: 56 mg/dL (ref 26–217)
IgA, Qn, Serum: 611 mg/dL — ABNORMAL HIGH (ref 87–352)
TOTAL PROTEIN: 8 g/dL (ref 6.0–8.5)

## 2016-10-07 ENCOUNTER — Telehealth: Payer: Self-pay | Admitting: *Deleted

## 2016-10-07 ENCOUNTER — Emergency Department (HOSPITAL_COMMUNITY): Payer: Medicare Other

## 2016-10-07 ENCOUNTER — Encounter (HOSPITAL_COMMUNITY): Payer: Self-pay | Admitting: Emergency Medicine

## 2016-10-07 ENCOUNTER — Emergency Department (HOSPITAL_COMMUNITY)
Admission: EM | Admit: 2016-10-07 | Discharge: 2016-10-07 | Disposition: A | Discharge status: Home / Self Care | Payer: Medicare Other | Attending: Emergency Medicine | Admitting: Emergency Medicine

## 2016-10-07 DIAGNOSIS — J449 Chronic obstructive pulmonary disease, unspecified: Secondary | ICD-10-CM | POA: Insufficient documentation

## 2016-10-07 DIAGNOSIS — Z85118 Personal history of other malignant neoplasm of bronchus and lung: Secondary | ICD-10-CM | POA: Insufficient documentation

## 2016-10-07 DIAGNOSIS — Z8542 Personal history of malignant neoplasm of other parts of uterus: Secondary | ICD-10-CM | POA: Insufficient documentation

## 2016-10-07 DIAGNOSIS — Z79899 Other long term (current) drug therapy: Secondary | ICD-10-CM | POA: Diagnosis not present

## 2016-10-07 DIAGNOSIS — Z87891 Personal history of nicotine dependence: Secondary | ICD-10-CM | POA: Insufficient documentation

## 2016-10-07 DIAGNOSIS — E860 Dehydration: Secondary | ICD-10-CM | POA: Diagnosis not present

## 2016-10-07 DIAGNOSIS — R531 Weakness: Secondary | ICD-10-CM | POA: Diagnosis present

## 2016-10-07 LAB — CBC WITH DIFFERENTIAL/PLATELET
Basophils Absolute: 0 10*3/uL (ref 0.0–0.1)
Basophils Relative: 0 %
EOS PCT: 1 %
Eosinophils Absolute: 0.1 10*3/uL (ref 0.0–0.7)
HEMATOCRIT: 28.4 % — AB (ref 36.0–46.0)
Hemoglobin: 9.1 g/dL — ABNORMAL LOW (ref 12.0–15.0)
LYMPHS PCT: 23 %
Lymphs Abs: 1.4 10*3/uL (ref 0.7–4.0)
MCH: 29.9 pg (ref 26.0–34.0)
MCHC: 32 g/dL (ref 30.0–36.0)
MCV: 93.4 fL (ref 78.0–100.0)
MONO ABS: 0.7 10*3/uL (ref 0.1–1.0)
MONOS PCT: 12 %
NEUTROS ABS: 4 10*3/uL (ref 1.7–7.7)
Neutrophils Relative %: 64 %
PLATELETS: 381 10*3/uL (ref 150–400)
RBC: 3.04 MIL/uL — ABNORMAL LOW (ref 3.87–5.11)
RDW: 19.5 % — AB (ref 11.5–15.5)
WBC: 6.3 10*3/uL (ref 4.0–10.5)

## 2016-10-07 LAB — URINALYSIS, ROUTINE W REFLEX MICROSCOPIC
BACTERIA UA: NONE SEEN
Bilirubin Urine: NEGATIVE
Glucose, UA: NEGATIVE mg/dL
Ketones, ur: NEGATIVE mg/dL
Nitrite: NEGATIVE
PH: 6 (ref 5.0–8.0)
Protein, ur: 30 mg/dL — AB
SPECIFIC GRAVITY, URINE: 1.019 (ref 1.005–1.030)

## 2016-10-07 LAB — COMPREHENSIVE METABOLIC PANEL
ALK PHOS: 332 U/L — AB (ref 38–126)
ALT: 103 U/L — ABNORMAL HIGH (ref 14–54)
AST: 78 U/L — AB (ref 15–41)
Albumin: 2.4 g/dL — ABNORMAL LOW (ref 3.5–5.0)
Anion gap: 10 (ref 5–15)
BILIRUBIN TOTAL: 0.9 mg/dL (ref 0.3–1.2)
BUN: 13 mg/dL (ref 6–20)
CALCIUM: 9 mg/dL (ref 8.9–10.3)
CO2: 27 mmol/L (ref 22–32)
Chloride: 101 mmol/L (ref 101–111)
Creatinine, Ser: 0.75 mg/dL (ref 0.44–1.00)
GFR calc Af Amer: >60 mL/min (ref >60)
GFR calc non Af Amer: >60 mL/min (ref >60)
GLUCOSE: 99 mg/dL (ref 65–99)
POTASSIUM: 4.5 mmol/L (ref 3.5–5.1)
SODIUM: 138 mmol/L (ref 135–145)
TOTAL PROTEIN: 8.8 g/dL — AB (ref 6.5–8.1)

## 2016-10-07 LAB — I-STAT CG4 LACTIC ACID, ED: Lactic Acid, Venous: 1.16 mmol/L (ref 0.5–1.9)

## 2016-10-07 MED ORDER — SODIUM CHLORIDE 0.9 % IV BOLUS (SEPSIS)
1000.0000 mL | Freq: Once | INTRAVENOUS | Status: AC
  Administered 2016-10-07: 1000 mL via INTRAVENOUS

## 2016-10-07 NOTE — Telephone Encounter (Signed)
Called back and gave below message to [place]. She verbalized understanding.  Elmo Putt asked if they could change Monday 4-30 appointments to Tuesday 10-15-16?

## 2016-10-07 NOTE — Telephone Encounter (Signed)
Coughing blood is serious She needs to go to the ER and possibly admitted

## 2016-10-07 NOTE — ED Triage Notes (Signed)
Patient reports sharp pain in head last night. Patient denies pain at this time. Patient was spitting up blood and having nose bleeds a couple of days ago. Patient has lung cancer. Patient last had chemo last week.

## 2016-10-07 NOTE — Telephone Encounter (Signed)
Since she is very sick, we will see how her evaluation showed I have no room to move her appt

## 2016-10-07 NOTE — ED Provider Notes (Signed)
[facility] DEPT Provider Note   CSN: [id number] Arrival date & time: 10/07/16  1320     History   Chief Complaint Chief Complaint  Patient presents with  . Headache  . Hypotension    HPI Sheena Caldwell is a 66 y.o. female.  Pt presents to the ED today with weakness, dehydration, and coughing up blood/nose bleeds.  Pt has a hx of uterine cancer with mets.  She is getting palliative chemo (Abraxane) with Dr. Alvy Bimler.  She did have this done on 4/16.  Her hgb was 8.1 and she was given 2 units of pRBCs.  The pt had some hemoptysis and some nosebleeds 3 days ago.  None since.  Pt has not been eating or drinking much.  Family is worried that she is dehydrated.      Past Medical History:  Diagnosis Date  . Anemia   . Arthritis   . Blood transfusion without reported diagnosis    2015, 2016  . Constipation   . COPD (chronic obstructive pulmonary disease) ([place])   . Radiation 01/03/15, 01/11/15, 01/13/15, 01/24/15, 01/26/15   proximal vagina 30 gray  . Uterine cancer [facility])    finished chemo May 2016, to start radiation July 2016    Patient Active Problem List   Diagnosis Date Noted  . Anxiety about health 09/20/2016  . Goals of care, counseling/discussion 09/11/2016  . Mucositis due to antineoplastic therapy 07/26/2016  . Malignant neoplasm metastatic to lung ([place]) 07/07/2016  . Encounter for antineoplastic chemotherapy 06/04/2016  . DNR (do not resuscitate) 01/20/2016  . Cancer associated pain 01/06/2016  . Pleural effusion, malignant 12/22/2015  . International Federation of Gynecology and Obstetrics (FIGO) stage IVB malignant neoplasm of endometrium ([place]) 12/17/2015  . 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 Hypoxia   . Cachexia ([place])   . Moderate malnutrition ([place])   . Port catheter in place 09/10/2015  . Iron deficiency anemia due to chronic blood loss 09/10/2015  .  Antineoplastic chemotherapy induced anemia 10/08/2014  . Arterial hypotension 10/08/2014  . Elevated blood protein 10/08/2014  . Pyrexia 10/01/2014  . Hematuria, undiagnosed cause 09/16/2014  . 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 Macrocytic anemia 08/23/2014  . Hematuria 08/15/2014  . Abdominal pain 08/15/2014  . Hypotension 08/15/2014  . Weakness 08/13/2014  . Fever 08/13/2014  . Constipation 08/13/2014  . Anorexia 08/13/2014  . Dehydration 08/13/2014  . Hypoalbuminemia 08/13/2014  . Transaminitis 08/13/2014  . Hyperbilirubinemia 08/13/2014  . Tobacco abuse, episodic 08/09/2014  . Chemotherapy induced nausea and vomiting 08/09/2014  . Chemotherapy induced neutropenia ([place]) 08/09/2014  . Portacath in place 08/09/2014  . Broken teeth 08/09/2014  . Poor dentition 08/09/2014  . Endometrial cancer ([place]) 08/05/2014  . COPD (chronic obstructive pulmonary disease) ([place]) 02/19/2012  . Macrocytosis without anemia 02/19/2012    Past Surgical History:  Procedure Laterality Date  . CESAREAN SECTION  1991  . CHEST TUBE INSERTION Left 12/22/2015   Procedure: INSERTION PLEURAL DRAINAGE CATHETER;  Surgeon: Ivin Poot, MD;  Location: [facility];  Service: Thoracic;  Laterality: Left;  . COLONOSCOPY    . INCISE AND DRAIN ABCESS  1970   scalp  . PLEURADESIS Left 12/22/2015   Procedure: PLEURADESIS;  Surgeon: Ivin Poot, MD;  Location: [facility];  Service: Thoracic;  Laterality: Left;  . PLEURAL EFFUSION DRAINAGE Left 12/22/2015   Procedure: DRAINAGE OF PLEURAL EFFUSION;  Surgeon: Tharon Aquas Trigt,  MD;  Location: [facility];  Service: Thoracic;  Laterality: Left;  . PORTACATH PLACEMENT  04/26/2014   rt. power port with tip in SVC  . TOTAL ABDOMINAL HYSTERECTOMY W/ BILATERAL SALPINGOOPHORECTOMY  04/06/14   TAH, BSO, omentectomy, pelvic and right common iliac node evaluation  . VIDEO ASSISTED THORACOSCOPY Left 12/22/2015   Procedure: VIDEO ASSISTED THORACOSCOPY;  Surgeon: Ivin Poot, MD;  Location: [facility] OR;  Service: Thoracic;  Laterality: Left;    OB History    Gravida Para Term Preterm AB Living   3 2 2  0 1 2   SAB TAB Ectopic Multiple Live Births   0 0 0 0         Home Medications    Prior to Admission medications   Medication Sig Start Date End Date Taking? Authorizing Provider  albuterol (PROVENTIL HFA;VENTOLIN HFA) 108 (90 Base) MCG/ACT inhaler Inhale 1 puff into the lungs every 6 (six) hours as needed for wheezing or shortness of breath.   Yes Historical Provider, MD  Calcium Carbonate-Vitamin D (CALCIUM 600+D) 600-400 MG-UNIT tablet Take 1 tablet by mouth daily.   Yes Historical Provider, MD  cholecalciferol (VITAMIN D) 1000 units tablet Take 1,000 Units by mouth daily.   Yes Historical Provider, MD  ferrous sulfate 325 (65 FE) MG tablet Take 325 mg by mouth daily with breakfast.    Yes Historical Provider, MD  fludrocortisone (FLORINEF) 0.1 MG tablet Take 1 tablet (0.1 mg total) by mouth daily. 08/30/16  Yes Heath Lark, MD  LORazepam (ATIVAN) 0.5 MG tablet Take 1 tablet (0.5 mg total) by mouth 2 (two) times daily as needed for anxiety. 09/11/16  Yes Heath Lark, MD  Multiple Vitamin (MULTIVITAMIN WITH MINERALS) TABS tablet Take 1 tablet by mouth daily.   Yes Historical Provider, MD  polyethylene glycol (MIRALAX / GLYCOLAX) packet Take 17 g by mouth daily.    Yes Historical Provider, MD  traMADol (ULTRAM) 50 MG tablet Take 1 tablet (50 mg total) by mouth every 6 (six) hours as needed. Patient taking differently: Take 50 mg by mouth every 6 (six) hours as needed for moderate pain.  09/20/16  Yes Heath Lark, MD  vitamin B-12 (CYANOCOBALAMIN) 1000 MCG tablet Take 1,000 mcg by mouth daily.   Yes Historical Provider, MD  mirtazapine (REMERON) 15 MG tablet Take 1 tablet (15 mg total) by mouth at bedtime. Patient not taking: Reported on 09/20/2016 08/28/16   Heath Lark, MD    Family History Family History  Problem Relation Age of Onset  . Hypertension Mother     . Cancer Father   . Colon cancer Neg Hx   . Esophageal cancer Neg Hx   . Rectal cancer Neg Hx   . Stomach cancer Neg Hx     Social History Social History  Substance Use Topics  . Smoking status: Former Smoker    Packs/day: 1.00    Years: 30.00    Types: Cigarettes    Quit date: 07/16/2013  . Smokeless tobacco: Never Used  . Alcohol use 1.2 - 1.8 oz/week    2 - 3 Glasses of wine per week     Comment: none now     Allergies   Carboplatin and Levofloxacin   Review of Systems Review of Systems  Constitutional: Positive for fatigue.  Respiratory: Positive for shortness of breath.        Hemoptysis  All other systems reviewed and are negative.    Physical Exam Updated Vital Signs BP 96/73 (BP Location:  Left Arm)   Pulse 87   Resp 16   Ht 5\' 6"  (1.676 m)   Wt 120 lb (54.4 kg)   SpO2 100%   BMI 19.37 kg/m   Physical Exam  Constitutional: She is oriented to person, place, and time. She appears cachectic.  HENT:  Head: Normocephalic and atraumatic.  Right Ear: External ear normal.  Left Ear: External ear normal.  Nose: Nose normal.  Mouth/Throat: Mucous membranes are dry.  Eyes: Conjunctivae and EOM are normal. Pupils are equal, round, and reactive to light.  Neck: Normal range of motion. Neck supple.  Cardiovascular: Normal rate, regular rhythm, normal heart sounds and intact distal pulses.   Pulmonary/Chest: Effort normal and breath sounds normal.  Abdominal: Soft. Bowel sounds are normal.  Musculoskeletal: Normal range of motion.  Neurological: She is alert and oriented to person, place, and time.  Skin: Skin is warm and dry.  Psychiatric: She has a normal mood and affect. Her behavior is normal. Judgment and thought content normal.  Nursing note and vitals reviewed.    ED Treatments / Results  Labs (all labs ordered are listed, but only abnormal results are displayed) Labs Reviewed  COMPREHENSIVE METABOLIC PANEL - Abnormal; Notable for the following:        Result Value   Total Protein 8.8 (*)    Albumin 2.4 (*)    AST 78 (*)    ALT 103 (*)    Alkaline Phosphatase 332 (*)    All other components within normal limits  CBC WITH DIFFERENTIAL/PLATELET - Abnormal; Notable for the following:    RBC 3.04 (*)    Hemoglobin 9.1 (*)    HCT 28.4 (*)    RDW 19.5 (*)    All other components within normal limits  URINALYSIS, ROUTINE W REFLEX MICROSCOPIC - Abnormal; Notable for the following:    Color, Urine AMBER (*)    APPearance HAZY (*)    Hgb urine dipstick SMALL (*)    Protein, ur 30 (*)    Leukocytes, UA TRACE (*)    Squamous Epithelial / LPF 0-5 (*)    All other components within normal limits  I-STAT CG4 LACTIC ACID, ED  I-STAT CG4 LACTIC ACID, ED    EKG  EKG Interpretation None       Radiology No results found.  Procedures Procedures (including critical care time)  Medications Ordered in ED Medications  sodium chloride 0.9 % bolus 1,000 mL (0 mLs Intravenous Stopped 10/07/16 1549)  sodium chloride 0.9 % bolus 1,000 mL (1,000 mLs Intravenous New Bag/Given 10/07/16 1402)  sodium chloride 0.9 % bolus 1,000 mL (1,000 mLs Intravenous New Bag/Given 10/07/16 1630)     Initial Impression / Assessment and Plan / ED Course  I have reviewed the triage vital signs and the nursing notes.  Pertinent labs & imaging results that were available during my care of the patient were reviewed by me and considered in my medical decision making (see chart for details).    Pt is feeling much better.  She is ready to go home.  BP and HR have improved.  Pt knows to return if worse.  She did not want a CXR.    Final Clinical Impressions(s) / ED Diagnoses   Final diagnoses:  Dehydration    New Prescriptions New Prescriptions   No medications on file     Isla Pence, MD 10/07/16 1654

## 2016-10-07 NOTE — ED Notes (Signed)
[facility] EDP cancelled I-stat lactic acid

## 2016-10-07 NOTE — ED Notes (Signed)
Pt requested consult to case management for them to call her when she gets home. Made secretary aware.

## 2016-10-07 NOTE — Telephone Encounter (Signed)
Called Sheena Caldwell back and given below message regarding changing appt next week. Verbalized understanding.  Instructed Sheena Caldwell that her Mom needs to go to the ER and be evaluated. Verbalized understanding.

## 2016-10-07 NOTE — Telephone Encounter (Signed)
Elmo Putt called back to say her Mom is concerned about her co-pay if she went to the ER and it would be cheaper to come to the [facility] for fluids. She stated that her Mom has not coughed up blood in 3 days.

## 2016-10-07 NOTE — ED Notes (Signed)
Pt verbalized understanding of discharge instructions.

## 2016-10-07 NOTE — Care Management (Signed)
ED CM received call from [facility] ED secretary Santiago Glad regarding patient needing CM to contact patient at home. Patient was in the [facility] ED CM did not receive a call regarding this patient care management needs.  CM reviewed records no HH orders noted in chart.  CM explained that [facility] would need to get orders from discharging EDP if the recommendation is for [facility] services.
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
## 2016-10-07 NOTE — ED Notes (Signed)
Pt refuses chest xray.

## 2016-10-14 ENCOUNTER — Other Ambulatory Visit (HOSPITAL_BASED_OUTPATIENT_CLINIC_OR_DEPARTMENT_OTHER): Payer: Medicare Other

## 2016-10-14 ENCOUNTER — Ambulatory Visit: Payer: Medicare Other

## 2016-10-14 ENCOUNTER — Ambulatory Visit (HOSPITAL_BASED_OUTPATIENT_CLINIC_OR_DEPARTMENT_OTHER): Payer: Medicare Other

## 2016-10-14 ENCOUNTER — Ambulatory Visit (HOSPITAL_BASED_OUTPATIENT_CLINIC_OR_DEPARTMENT_OTHER): Payer: Medicare Other | Admitting: Hematology and Oncology

## 2016-10-14 VITALS — BP 80/52 | HR 89 | Temp 98.3°F | Resp 17 | Ht 66.0 in | Wt 129.9 lb

## 2016-10-14 VITALS — BP 95/66 | HR 92

## 2016-10-14 DIAGNOSIS — C7801 Secondary malignant neoplasm of right lung: Secondary | ICD-10-CM

## 2016-10-14 DIAGNOSIS — R748 Abnormal levels of other serum enzymes: Secondary | ICD-10-CM

## 2016-10-14 DIAGNOSIS — R64 Cachexia: Secondary | ICD-10-CM

## 2016-10-14 DIAGNOSIS — C541 Malignant neoplasm of endometrium: Secondary | ICD-10-CM | POA: Diagnosis present

## 2016-10-14 DIAGNOSIS — T451X5A Adverse effect of antineoplastic and immunosuppressive drugs, initial encounter: Secondary | ICD-10-CM

## 2016-10-14 DIAGNOSIS — D6481 Anemia due to antineoplastic chemotherapy: Secondary | ICD-10-CM

## 2016-10-14 DIAGNOSIS — C7800 Secondary malignant neoplasm of unspecified lung: Secondary | ICD-10-CM

## 2016-10-14 DIAGNOSIS — E440 Moderate protein-calorie malnutrition: Secondary | ICD-10-CM

## 2016-10-14 DIAGNOSIS — C7802 Secondary malignant neoplasm of left lung: Secondary | ICD-10-CM

## 2016-10-14 LAB — COMPREHENSIVE METABOLIC PANEL
ALT: 54 U/L (ref 0–55)
AST: 43 U/L — AB (ref 5–34)
Albumin: 2 g/dL — ABNORMAL LOW (ref 3.5–5.0)
Alkaline Phosphatase: 340 U/L — ABNORMAL HIGH (ref 40–150)
Anion Gap: 13 mEq/L — ABNORMAL HIGH (ref 3–11)
BUN: 12.4 mg/dL (ref 7.0–26.0)
CHLORIDE: 100 meq/L (ref 98–109)
CO2: 24 meq/L (ref 22–29)
CREATININE: 0.7 mg/dL (ref 0.6–1.1)
Calcium: 9.8 mg/dL (ref 8.4–10.4)
EGFR: >90 mL/min/{1.73_m2} (ref >90)
GLUCOSE: 133 mg/dL (ref 70–140)
Potassium: 4.3 mEq/L (ref 3.5–5.1)
SODIUM: 138 meq/L (ref 136–145)
Total Bilirubin: 0.65 mg/dL (ref 0.20–1.20)
Total Protein: 8.9 g/dL — ABNORMAL HIGH (ref 6.4–8.3)

## 2016-10-14 LAB — CBC WITH DIFFERENTIAL/PLATELET
BASO%: 0.4 % (ref 0.0–2.0)
Basophils Absolute: 0 10*3/uL (ref 0.0–0.1)
EOS%: 1 % (ref 0.0–7.0)
Eosinophils Absolute: 0.1 10*3/uL (ref 0.0–0.5)
HCT: 28.2 % — ABNORMAL LOW (ref 34.8–46.6)
HGB: 8.7 g/dL — ABNORMAL LOW (ref 11.6–15.9)
LYMPH%: 17.4 % (ref 14.0–49.7)
MCH: 28.5 pg (ref 25.1–34.0)
MCHC: 30.9 g/dL — AB (ref 31.5–36.0)
MCV: 92.5 fL (ref 79.5–101.0)
MONO#: 0.7 10*3/uL (ref 0.1–0.9)
MONO%: 10.1 % (ref 0.0–14.0)
NEUT%: 71.1 % (ref 38.4–76.8)
NEUTROS ABS: 4.8 10*3/uL (ref 1.5–6.5)
PLATELETS: 401 10*3/uL — AB (ref 145–400)
RBC: 3.05 10*6/uL — AB (ref 3.70–5.45)
RDW: 19.7 % — AB (ref 11.2–14.5)
WBC: 6.7 10*3/uL (ref 3.9–10.3)
lymph#: 1.2 10*3/uL (ref 0.9–3.3)
nRBC: 0 % (ref 0–0)

## 2016-10-14 MED ORDER — SODIUM CHLORIDE 0.9 % IV SOLN
Freq: Once | INTRAVENOUS | Status: AC
  Administered 2016-10-14: 13:00:00 via INTRAVENOUS

## 2016-10-14 MED ORDER — SODIUM CHLORIDE 0.9 % IV SOLN: 10.0000 mg | Freq: Once | INTRAVENOUS | Status: DC

## 2016-10-14 MED ORDER — SODIUM CHLORIDE 0.9% FLUSH
10.0000 mL | Freq: Once | INTRAVENOUS | Status: AC
  Administered 2016-10-14: 10 mL
  Filled 2016-10-14: qty 10

## 2016-10-14 MED ORDER — PREDNISONE 50 MG PO TABS: 50.0000 mg | ORAL_TABLET | Freq: Every day | ORAL | 0 refills | Status: DC

## 2016-10-14 MED ORDER — DEXAMETHASONE SODIUM PHOSPHATE 10 MG/ML IJ SOLN
10.0000 mg | Freq: Once | INTRAMUSCULAR | Status: AC
  Administered 2016-10-14: 10 mg via INTRAVENOUS

## 2016-10-14 MED ORDER — HEPARIN SOD (PORK) LOCK FLUSH 100 UNIT/ML IV SOLN
500.0000 [IU] | Freq: Once | INTRAVENOUS | Status: AC | PRN
  Administered 2016-10-14: 500 [IU]
  Filled 2016-10-14: qty 5

## 2016-10-14 MED ORDER — SODIUM CHLORIDE 0.9% FLUSH
10.0000 mL | INTRAVENOUS | Status: DC | PRN
  Administered 2016-10-14: 10 mL
  Filled 2016-10-14: qty 10

## 2016-10-14 MED ORDER — DEXAMETHASONE SODIUM PHOSPHATE 10 MG/ML IJ SOLN
INTRAMUSCULAR | Status: AC
Start: 2016-10-14 — End: 2016-10-14
  Filled 2016-10-14: qty 1

## 2016-10-14 NOTE — Patient Instructions (Signed)
Dehydration, Adult Dehydration is when there is not enough fluid or water in your body. This happens when you lose more fluids than you take in. Dehydration can range from mild to very bad. It should be treated right away to keep it from getting very bad. Symptoms of mild dehydration may include:   Thirst.  Dry lips.  Slightly dry mouth.  Dry, warm skin.  Dizziness. Symptoms of moderate dehydration may include:   Very dry mouth.  Muscle cramps.  Dark pee (urine). Pee may be the color of tea.  Your body making less pee.  Your eyes making fewer tears.  Heartbeat that is uneven or faster than normal (palpitations).  Headache.  Light-headedness, especially when you stand up from sitting.  Fainting (syncope). Symptoms of very bad dehydration may include:   Changes in skin, such as:  Cold and clammy skin.  Blotchy (mottled) or pale skin.  Skin that does not quickly return to normal after being lightly pinched and let go (poor skin turgor).  Changes in body fluids, such as:  Feeling very thirsty.  Your eyes making fewer tears.  Not sweating when body temperature is high, such as in hot weather.  Your body making very little pee.  Changes in vital signs, such as:  Weak pulse.  Pulse that is more than 100 beats a minute when you are sitting still.  Fast breathing.  Low blood pressure.  Other changes, such as:  Sunken eyes.  Cold hands and feet.  Confusion.  Lack of energy (lethargy).  Trouble waking up from sleep.  Short-term weight loss.  Unconsciousness. Follow these instructions at home:  If told by your doctor, drink an ORS:  Make an ORS by using instructions on the package.  Start by drinking small amounts, about  cup (120 mL) every 5-10 minutes.  Slowly drink more until you have had the amount that your doctor said to have.  Drink enough clear fluid to keep your pee clear or pale yellow. If you were told to drink an ORS, finish the  ORS first, then start slowly drinking clear fluids. Drink fluids such as:  Water. Do not drink only water by itself. Doing that can make the salt (sodium) level in your body get too low (hyponatremia).  Ice chips.  Fruit juice that you have added water to (diluted).  Low-calorie sports drinks.  Avoid:  Alcohol.  Drinks that have a lot of sugar. These include high-calorie sports drinks, fruit juice that does not have water added, and soda.  Caffeine.  Foods that are greasy or have a lot of fat or sugar.  Take over-the-counter and prescription medicines only as told by your doctor.  Do not take salt tablets. Doing that can make the salt level in your body get too high (hypernatremia).  Eat foods that have minerals (electrolytes). Examples include bananas, oranges, potatoes, tomatoes, and spinach.  Keep all follow-up visits as told by your doctor. This is important. Contact a doctor if:  You have belly (abdominal) pain that:  Gets worse.  Stays in one area (localizes).  You have a rash.  You have a stiff neck.  You get angry or annoyed more easily than normal (irritability).  You are more sleepy than normal.  You have a harder time waking up than normal.  You feel:  Weak.  Dizzy.  Very thirsty.  You have peed (urinated) only a small amount of very dark pee during 6-8 hours. Get help right away if:  You   have symptoms of very bad dehydration.  You cannot drink fluids without throwing up (vomiting).  Your symptoms get worse with treatment.  You have a fever.  You have a very bad headache.  You are throwing up or having watery poop (diarrhea) and it:  Gets worse.  Does not go away.  You have blood or something green (bile) in your throw-up.  You have blood in your poop (stool). This may cause poop to look black and tarry.  You have not peed in 6-8 hours.  You pass out (faint).  Your heart rate when you are sitting still is more than 100 beats a  minute.  You have trouble breathing. This information is not intended to replace advice given to you by your health care provider. Make sure you discuss any questions you have with your health care provider. Document Released: 03/30/2009 Document Revised: 12/22/2015 Document Reviewed: 07/28/2015 Elsevier Interactive Patient Education  2017 [employer]  

## 2016-10-15 ENCOUNTER — Encounter: Payer: Self-pay | Admitting: Hematology and Oncology

## 2016-10-15 ENCOUNTER — Telehealth: Payer: Self-pay | Admitting: Hematology and Oncology

## 2016-10-15 DIAGNOSIS — R748 Abnormal levels of other serum enzymes: Secondary | ICD-10-CM | POA: Insufficient documentation

## 2016-10-15 NOTE — Assessment & Plan Note (Signed)
She is not symptomatic with chest pain or shortness of breath.  Repeat imaging study as above

## 2016-10-15 NOTE — Telephone Encounter (Signed)
Daughter came in to cancel fluid appt on 5/3 - per Dr. Alvy Bimler it was okay .

## 2016-10-15 NOTE — Assessment & Plan Note (Signed)
She has markedly elevated liver enzymes that could be related to I the side effects of treatment or disease progression.  I recommend holding treatment and repeat imaging study in the agreed

## 2016-10-15 NOTE — Assessment & Plan Note (Signed)
The patient has significant general decline in performance status. She is also noted to have markedly abnormal LFTs that could be either related to side effects of chemotherapy or disease progression Based on her current performance status and unstable vital signs, I recommend against treatment I recommend supportive care with IV fluid hydration and I will try to schedule urgent CT scan evaluation for repeat staging.  They agreed to proceed

## 2016-10-15 NOTE — Assessment & Plan Note (Signed)
She has progressive malnutrition due to poor oral intake and poor appetite I recommend a trial of high-dose prednisone therapy.  She agreed to try

## 2016-10-15 NOTE — Assessment & Plan Note (Signed)
This is likely due to recent treatment. The patient denies recent history of bleeding such as epistaxis, hematuria or hematochezia. She is asymptomatic from the anemia. I will observe for now.   

## 2016-10-15 NOTE — Assessment & Plan Note (Signed)
She has significant cancer cachexia Recent SPEP revealed polyclonal gammopathy, suggesting either she may have undiagnosed autoimmune disorder versus paraproteinemia secondary to cancer I recommend trial of high-dose prednisone therapy and she agreed to proceed

## 2016-10-15 NOTE — Progress Notes (Signed)
[facility] OFFICE PROGRESS NOTE  Patient Care Team: Jacquelyne Balint, MD as PCP - General (Gynecologic Oncology)  SUMMARY OF ONCOLOGIC HISTORY:   Endometrial cancer [facility])   03/07/2014 Imaging    CT scan showed large uterine mass      03/07/2014 Initial Diagnosis    Surgery by Dr Jacquelyne Balint at [facility] in [place] on 04-06-14 TAH, BSO, omentectomy, pelvic and right common iliac node evaluation. 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 Bulk of the carcinoma was in lower uterine segment where it was within 0.1 cm of serosal surface. Cytology positive for adenocarcinoma on washings ([id number]). Surgical findings were significant for no paraaortic adenopathy apparent. Patient received 3 cycles of adjuvant chemotherapy in [place] by Dr Sabra Heck from11-19-15 thru2-12-16, with neulasta      08/15/2014 Imaging    Interval hysterectomy. 2. Retroperitoneal low-density structures which are suspicious for necrotic adenopathy/metastasis. Left periaortic component is slightly decreased in size. However, there is new soft tissue thickening about the abdominal aorta more superiorly. Less likely differential considerations include benign lymphangioma/lymphangiomas and/or concurrent vasculitis. Consider PET for further evaluation. 3. Development of bilateral pleural effusions with left base atelectasis. 4. Indeterminate bibasilar pulmonary nodules, felt to be similar. Consider further evaluation with chest CT, as recommended on 03/25/2014. 5.  Possible constipation.      08/24/2014 - 10/21/2014 Chemotherapy    She received 2 cycles of carbo/taxol. Cycle 3 with [place] only       11/10/2014  Imaging    Prior periaortic lucency of concern at the hiatus has resolved and was probably transient and inflammatory or due to third spacing of fluid. The pericardial effusion and pleural effusions have also resolved. 2. Upper normal sized lymph node just above the right inferior pulmonary vein is new compared to the prior exam and may merit Observation. 3. Tiny scattered pulmonary nodules remain stable. 4.  Prominent stool throughout the colon favors constipation. 5.  Aortoiliac atherosclerotic vascular disease. 6. Degenerative arthropathy of the hips and degenerative disc disease in the lumbar spine. 7. Small ventral supraumbilical hernia contains adipose tissue. 8. Emphysema. 9. Left anterior descending coronary artery atherosclerosis. 10. Ascending thoracic aortic aneurysm.       12/25/2014 Imaging    No acute findings within the abdomen or pelvis. No evidence of recurrent or metastatic carcinoma. Large stool burden again demonstrated; suggest clinical correlation for possible constipation.       01/03/2015 - 01/26/2015 Radiation Therapy    July 19, July 27, July 29, August 9, August 11 Site/dose: Proximal vagina, 30 gray in 5 fractions      11/30/2015 Imaging    Significant progression of disease, as detailed above. This includes numerous pulmonary masses/nodules within the right lung, largest of which is at the right lung base measuring 3.1 x 2.6 cm. A new pleural based mass is now seen along the medial aspects of the left upper lobe measuring 2.6 x 1.7 cm. On earlier chest CT of 11/04/2014, the largest pulmonary nodule identified within the left lung was 6 mm and the largest pulmonary nodule identified within the right lung was 4 mm. 2. Mediastinal and perihilar lymphadenopathy, most numerous within the left lower paratracheal and aortopulmonary window regions, with largest lymph  nodes in the right perihilar region demonstrating evidence of central necrosis, almost certainly compatible with  metastatic lymphadenopathy throughout and further evidence of progression of disease. 3. Large left pleural effusion, likely malignant effusion, with adjacent compressive atelectasis. Heart is displaced to the right by the large left pleural effusion. 4. Emphysematous change, upper lobe predominant. 5. No definite evidence of osseous metastasis. Scattered tiny lucent foci within the thoracic vertebral bodies could conceivably represent early developing metastases.      11/30/2015 - 12/06/2015 Hospital Admission    She was admitted for severe shortness of breath and was found to have malignant effusion      12/01/2015 Imaging    As seen on yesterday's chest CT, there is metastatic disease to both lung bases with a large malignant left pleural effusion. There are several pleural-based metastases on the left and right infrahilar lymphadenopathy, incompletely visualized. 2. Left periaortic and left pelvic side wall lymphadenopathy consistent with metastatic disease. No other evidence of abdominal  pelvic metastatic disease. 3. No hydronephrosis.      12/04/2015 Procedure    Successful placement of a tunneled left pleural catheter with ultrasound and fluoroscopic guidance.       12/22/2015 Pathology Results    Pleura, peel, Left pleural peel - RARE MALIGNANT CELLS. - SEE MICROSCOPIC DESCRIPTION Microscopic Comment The specimen consists of abundant fibrin with rare minute aggregates of epithelial cells with malignant features consistent with poorly differentiated carcinoma.       12/22/2015 - 12/26/2015 Hospital Admission    She was admitted for management of malignant pleural effusion      12/22/2015 Surgery    OPERATION:  Left VATS (video-assisted thoracoscopic surgery) with drainage of loculated malignant effusion, chemical pleurodesis with doxycycline, placement of PleurX catheter.  PREOPERATIVE DIAGNOSES:  Loculated left malignant effusion, history of endometrial carcinoma, advanced  age.  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      01/04/2016 - 05/16/2016 Chemotherapy    She received carboplatin & Taxol       03/19/2016 Imaging    Interval response to therapy. 2. Decrease in volume of left pleural effusion. The index lesions within the chest have decreased in size in the interval. 3. Interval decrease in size of retroperitoneal and left pelvic side wall adenopathy. 4. Aortic atherosclerosis.       06/21/2016 Imaging    Apparent interval mixed response to therapy. Pleural disease in the chest has progressed along with progression of mediastinal lymphadenopathy. Some pulmonary nodules progressed while others have decreased in size in the interval. The index abdominal retroperitoneal left pelvic sidewall lymph nodes have decreased in the interval. 2. Persistent small left pleural effusion with posterior left base collapse/consolidation. 3.  Abdominal Aortic Atherosclerois       07/10/2016 Imaging    Left ventricle: The cavity size was normal. Wall thickness was increased in a pattern of mild LVH. Systolic function was normal. The estimated ejection fraction was in the range of 60% to 65%. Wall motion was normal; there were no regional wall motion abnormalities. Doppler parameters are consistent with abnormal left ventricular relaxation (grade 1 diastolic dysfunction).       07/11/2016 - 08/08/2016 Chemotherapy    She received Doxil only       07/25/2016 Adverse Reaction    She felt dehydrated, dizzy, had mucositis, constipated and miserable      09/02/2016 Imaging    Mild increase in mediastinal lymphadenopathy. Stable  mild bilateral hilar lymphadenopathy. Mild interval progression of bilateral pulmonary metastases. Stable small left pleural effusion. Stable mild left external iliac lymphadenopathy. No new or progressive metastatic disease within the abdomen or  pelvis      09/20/2016 -  Chemotherapy    The patient had palliative chemo with Abraxane       INTERVAL HISTORY: Please see below for problem oriented charting. She returns with her daughter She continues to have progressive weight loss, poor appetite and very minimum oral intake She complain of dizziness Blood pressure is very low She had recent nosebleed, resolved conservatively She denies chest pain or shortness of breath Denies pain She denies nausea or constipation She felt very weak overall  REVIEW OF SYSTEMS:   Constitutional: Denies fevers, chills  Eyes: Denies blurriness of vision Ears, nose, mouth, throat, and face: Denies mucositis or sore throat Respiratory: Denies cough, dyspnea or wheezes Cardiovascular: Denies palpitation, chest discomfort or lower extremity swelling Gastrointestinal:  Denies nausea, heartburn or change in bowel habits Skin: Denies abnormal skin rashes Lymphatics: Denies new lymphadenopathy or easy bruising Behavioral/Psych: Mood is stable, no new changes  All other systems were reviewed with the patient and are negative.  I have reviewed the past medical history, past surgical history, social history and family history with the patient and they are unchanged from previous note.  ALLERGIES:  is allergic to carboplatin and levofloxacin.  MEDICATIONS:  Current Outpatient Prescriptions  Medication Sig Dispense Refill  . albuterol (PROVENTIL HFA;VENTOLIN HFA) 108 (90 Base) MCG/ACT inhaler Inhale 1 puff into the lungs every 6 (six) hours as needed for wheezing or shortness of breath.    . Calcium Carbonate-Vitamin D (CALCIUM 600+D) 600-400 MG-UNIT tablet Take 1 tablet by mouth daily.    . cholecalciferol (VITAMIN D) 1000 units tablet Take 1,000 Units by mouth daily.    . ferrous sulfate 325 (65 FE) MG tablet Take 325 mg by mouth daily with breakfast.     . fludrocortisone (FLORINEF) 0.1 MG tablet Take 1 tablet (0.1 mg total) by mouth daily. 90  tablet 3  . Multiple Vitamin (MULTIVITAMIN WITH MINERALS) TABS tablet Take 1 tablet by mouth daily.    . polyethylene glycol (MIRALAX / GLYCOLAX) packet Take 17 g by mouth daily.     . traMADol (ULTRAM) 50 MG tablet Take 1 tablet (50 mg total) by mouth every 6 (six) hours as needed. (Patient taking differently: Take 50 mg by mouth every 6 (six) hours as needed for moderate pain. ) 90 tablet 0  . vitamin B-12 (CYANOCOBALAMIN) 1000 MCG tablet Take 1,000 mcg by mouth daily.    Marland Kitchen LORazepam (ATIVAN) 0.5 MG tablet Take 1 tablet (0.5 mg total) by mouth 2 (two) times daily as needed for anxiety. (Patient not taking: Reported on 10/14/2016) 30 tablet 0  . mirtazapine (REMERON) 15 MG tablet Take 1 tablet (15 mg total) by mouth at bedtime. (Patient not taking: Reported on 10/14/2016) 90 tablet 1  . predniSONE (DELTASONE) 50 MG tablet Take 1 tablet (50 mg total) by mouth daily with breakfast. 10 tablet 0   No current facility-administered medications for this visit.    Facility-Administered Medications Ordered in Other Visits  Medication Dose Route Frequency Provider Last Rate Last Dose  . sodium chloride 0.9 % injection 10 mL  10 mL Intravenous PRN Lennis P Livesay, MD   10 mL at 02/15/16 1601    PHYSICAL EXAMINATION: ECOG PERFORMANCE STATUS: 2 - Symptomatic, <50% confined to bed  Vitals:   10/14/16  1148  BP: (!) 80/52  Pulse: 89  Resp: 17  Temp: 98.3 F (36.8 C)   Filed Weights   10/14/16 1148  Weight: 129 lb 14.4 oz (58.9 kg)    GENERAL:alert, no distress and comfortable.  She looks thin and cachectic SKIN: skin color, texture, turgor are normal, no rashes or significant lesions EYES: normal, Conjunctiva are pale and non-injected, sclera clear OROPHARYNX:no exudate, no erythema and lips, buccal mucosa, and tongue normal  NECK: supple, thyroid normal size, non-tender, without nodularity LYMPH:  no palpable lymphadenopathy in the cervical, axillary or inguinal LUNGS: clear to auscultation  and percussion with normal breathing effort HEART: regular rate & rhythm and no murmurs and no lower extremity edema ABDOMEN:abdomen soft, non-tender and normal bowel sounds Musculoskeletal:no cyanosis of digits and no clubbing  NEURO: alert & oriented x 3 with fluent speech, no focal motor/sensory deficits  LABORATORY DATA:  I have reviewed the data as listed    Component Value Date/Time   NA 138 10/14/2016 1114   K 4.3 10/14/2016 1114   CL 101 10/07/2016 1352   CO2 24 10/14/2016 1114   GLUCOSE 133 10/14/2016 1114   BUN 12.4 10/14/2016 1114   CREATININE 0.7 10/14/2016 1114   CALCIUM 9.8 10/14/2016 1114   PROT 8.9 (H) 10/14/2016 1114   ALBUMIN 2.0 (L) 10/14/2016 1114   AST 43 (H) 10/14/2016 1114   ALT 54 10/14/2016 1114   ALKPHOS 340 (H) 10/14/2016 1114   BILITOT 0.65 10/14/2016 1114   GFRNONAA >60 10/07/2016 1352   GFRAA >60 10/07/2016 1352    No results found for: SPEP, UPEP  Lab Results  Component Value Date   WBC 6.7 10/14/2016   NEUTROABS 4.8 10/14/2016   HGB 8.7 (L) 10/14/2016   HCT 28.2 (L) 10/14/2016   MCV 92.5 10/14/2016   PLT 401 (H) 10/14/2016      Chemistry      Component Value Date/Time   NA 138 10/14/2016 1114   K 4.3 10/14/2016 1114   CL 101 10/07/2016 1352   CO2 24 10/14/2016 1114   BUN 12.4 10/14/2016 1114   CREATININE 0.7 10/14/2016 1114      Component Value Date/Time   CALCIUM 9.8 10/14/2016 1114   ALKPHOS 340 (H) 10/14/2016 1114   AST 43 (H) 10/14/2016 1114   ALT 54 10/14/2016 1114   BILITOT 0.65 10/14/2016 1114       ASSESSMENT & PLAN:  Endometrial cancer ([place]) The patient has significant general decline in performance status. She is also noted to have markedly abnormal LFTs that could be either related to side effects of chemotherapy or disease progression Based on her current performance status and unstable vital signs, I recommend against treatment I recommend supportive care with IV fluid hydration and I will try to schedule  urgent CT scan evaluation for repeat staging.  They agreed to proceed  Malignant neoplasm metastatic to lung [facility]) She is not symptomatic with chest pain or shortness of breath.  Repeat imaging study as above  Moderate malnutrition ([place]) She has progressive malnutrition due to poor oral intake and poor appetite I recommend a trial of high-dose prednisone therapy.  She agreed to try  Elevated liver enzymes She has markedly elevated liver enzymes that could be related to I the side effects of treatment or disease progression.  I recommend holding treatment and repeat imaging study in the agreed  Antineoplastic chemotherapy induced anemia This is likely due to recent treatment. The patient denies recent history of bleeding  such as epistaxis, hematuria or hematochezia. She is asymptomatic from the anemia. I will observe for now.    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 CT CHEST W CONTRAST    Standing Status:   Future    Standing Expiration Date:   11/18/2017    Order Specific Question:   Reason for exam:    Answer:   staging endometrial cancer,assess response to Rx    Order Specific Question:   Preferred imaging location?    Answer:   [facility]  . CT ABDOMEN PELVIS W CONTRAST    Standing Status:   Future    Standing Expiration Date:   11/18/2017    Order Specific Question:   Reason for exam:    Answer:   staging endometrial cancer,assess response to Rx    Order Specific Question:   Preferred imaging location?    Answer:   [facility]   All questions were answered. The patient knows to call the clinic with any problems, questions or concerns. No barriers to learning was detected. I spent  30 minutes counseling the patient face to face. The total time spent in the appointment was 40 minutes and more than 50% was on counseling and review of test results     Heath Lark, MD 10/15/2016 7:09 AM

## 2016-10-16 ENCOUNTER — Encounter (HOSPITAL_COMMUNITY): Payer: Self-pay

## 2016-10-16 ENCOUNTER — Ambulatory Visit (HOSPITAL_COMMUNITY)
Admission: RE | Admit: 2016-10-16 | Discharge: 2016-10-16 | Disposition: A | Discharge status: Home / Self Care | Payer: Medicare Other | Source: Ambulatory Visit | Attending: Hematology and Oncology | Admitting: Hematology and Oncology

## 2016-10-16 DIAGNOSIS — C7802 Secondary malignant neoplasm of left lung: Secondary | ICD-10-CM | POA: Insufficient documentation

## 2016-10-16 DIAGNOSIS — R590 Localized enlarged lymph nodes: Secondary | ICD-10-CM | POA: Insufficient documentation

## 2016-10-16 DIAGNOSIS — I712 Thoracic aortic aneurysm, without rupture: Secondary | ICD-10-CM | POA: Diagnosis not present

## 2016-10-16 DIAGNOSIS — C541 Malignant neoplasm of endometrium: Secondary | ICD-10-CM | POA: Diagnosis present

## 2016-10-16 DIAGNOSIS — J439 Emphysema, unspecified: Secondary | ICD-10-CM | POA: Insufficient documentation

## 2016-10-16 MED ORDER — IOPAMIDOL (ISOVUE-300) INJECTION 61%
INTRAVENOUS | Status: AC
  Administered 2016-10-16: 100 mL
  Filled 2016-10-16: qty 100

## 2016-10-17 ENCOUNTER — Ambulatory Visit: Payer: Medicare Other

## 2016-10-21 ENCOUNTER — Ambulatory Visit (HOSPITAL_BASED_OUTPATIENT_CLINIC_OR_DEPARTMENT_OTHER): Payer: Medicare Other

## 2016-10-21 ENCOUNTER — Ambulatory Visit (HOSPITAL_BASED_OUTPATIENT_CLINIC_OR_DEPARTMENT_OTHER): Payer: Medicare Other | Admitting: Hematology and Oncology

## 2016-10-21 ENCOUNTER — Telehealth: Payer: Self-pay | Admitting: Hematology and Oncology

## 2016-10-21 ENCOUNTER — Ambulatory Visit: Payer: Medicare Other

## 2016-10-21 ENCOUNTER — Other Ambulatory Visit (HOSPITAL_BASED_OUTPATIENT_CLINIC_OR_DEPARTMENT_OTHER): Payer: Medicare Other

## 2016-10-21 ENCOUNTER — Encounter: Payer: Self-pay | Admitting: Hematology and Oncology

## 2016-10-21 VITALS — BP 103/71 | HR 88 | Resp 20

## 2016-10-21 VITALS — BP 79/49 | HR 86 | Temp 98.4°F | Resp 18 | Ht 66.0 in | Wt 129.5 lb

## 2016-10-21 DIAGNOSIS — C541 Malignant neoplasm of endometrium: Secondary | ICD-10-CM | POA: Diagnosis not present

## 2016-10-21 DIAGNOSIS — R64 Cachexia: Secondary | ICD-10-CM | POA: Diagnosis not present

## 2016-10-21 DIAGNOSIS — I951 Orthostatic hypotension: Secondary | ICD-10-CM | POA: Diagnosis present

## 2016-10-21 DIAGNOSIS — Z7189 Other specified counseling: Secondary | ICD-10-CM

## 2016-10-21 DIAGNOSIS — E440 Moderate protein-calorie malnutrition: Secondary | ICD-10-CM

## 2016-10-21 DIAGNOSIS — T451X5A Adverse effect of antineoplastic and immunosuppressive drugs, initial encounter: Secondary | ICD-10-CM

## 2016-10-21 DIAGNOSIS — D6481 Anemia due to antineoplastic chemotherapy: Secondary | ICD-10-CM

## 2016-10-21 DIAGNOSIS — R748 Abnormal levels of other serum enzymes: Secondary | ICD-10-CM

## 2016-10-21 LAB — COMPREHENSIVE METABOLIC PANEL
ALT: 156 U/L — ABNORMAL HIGH (ref 0–55)
AST: 94 U/L — AB (ref 5–34)
Albumin: 1.9 g/dL — ABNORMAL LOW (ref 3.5–5.0)
Alkaline Phosphatase: 489 U/L — ABNORMAL HIGH (ref 40–150)
Anion Gap: 12 mEq/L — ABNORMAL HIGH (ref 3–11)
BUN: 14.4 mg/dL (ref 7.0–26.0)
CHLORIDE: 98 meq/L (ref 98–109)
CO2: 26 meq/L (ref 22–29)
CREATININE: 0.7 mg/dL (ref 0.6–1.1)
Calcium: 10 mg/dL (ref 8.4–10.4)
EGFR: >90 mL/min/{1.73_m2} (ref >90)
GLUCOSE: 177 mg/dL — AB (ref 70–140)
Potassium: 4.5 mEq/L (ref 3.5–5.1)
Sodium: 136 mEq/L (ref 136–145)
Total Bilirubin: 0.58 mg/dL (ref 0.20–1.20)
Total Protein: 8.7 g/dL — ABNORMAL HIGH (ref 6.4–8.3)

## 2016-10-21 LAB — CBC WITH DIFFERENTIAL/PLATELET
BASO%: 0.2 % (ref 0.0–2.0)
Basophils Absolute: 0 10*3/uL (ref 0.0–0.1)
EOS%: 0.1 % (ref 0.0–7.0)
Eosinophils Absolute: 0 10*3/uL (ref 0.0–0.5)
HCT: 28.1 % — ABNORMAL LOW (ref 34.8–46.6)
HGB: 9.2 g/dL — ABNORMAL LOW (ref 11.6–15.9)
LYMPH#: 0.4 10*3/uL — AB (ref 0.9–3.3)
LYMPH%: 3.9 % — ABNORMAL LOW (ref 14.0–49.7)
MCH: 30 pg (ref 25.1–34.0)
MCHC: 32.6 g/dL (ref 31.5–36.0)
MCV: 92.1 fL (ref 79.5–101.0)
MONO#: 0.4 10*3/uL (ref 0.1–0.9)
MONO%: 4.2 % (ref 0.0–14.0)
NEUT#: 9.6 10*3/uL — ABNORMAL HIGH (ref 1.5–6.5)
NEUT%: 91.6 % — AB (ref 38.4–76.8)
Platelets: 484 10*3/uL — ABNORMAL HIGH (ref 145–400)
RBC: 3.05 10*6/uL — AB (ref 3.70–5.45)
RDW: 23.1 % — ABNORMAL HIGH (ref 11.2–14.5)
WBC: 10.5 10*3/uL — ABNORMAL HIGH (ref 3.9–10.3)

## 2016-10-21 MED ORDER — FLUDROCORTISONE ACETATE 0.1 MG PO TABS: 0.2000 mg | ORAL_TABLET | Freq: Every day | ORAL | 3 refills | Status: DC

## 2016-10-21 MED ORDER — SODIUM CHLORIDE 0.9% FLUSH
10.0000 mL | Freq: Once | INTRAVENOUS | Status: AC
  Administered 2016-10-21: 10 mL
  Filled 2016-10-21: qty 10

## 2016-10-21 MED ORDER — HEPARIN SOD (PORK) LOCK FLUSH 100 UNIT/ML IV SOLN
500.0000 [IU] | Freq: Once | INTRAVENOUS | Status: AC | PRN
  Administered 2016-10-21: 500 [IU]
  Filled 2016-10-21: qty 5

## 2016-10-21 MED ORDER — SODIUM CHLORIDE 0.9 % IV SOLN
Freq: Once | INTRAVENOUS | Status: AC
  Administered 2016-10-21: 14:00:00 via INTRAVENOUS

## 2016-10-21 MED ORDER — MEGESTROL ACETATE 40 MG PO TABS: 40.0000 mg | ORAL_TABLET | Freq: Two times a day (BID) | ORAL | 1 refills | Status: DC

## 2016-10-21 MED ORDER — SODIUM CHLORIDE 0.9% FLUSH
10.0000 mL | INTRAVENOUS | Status: DC | PRN
  Administered 2016-10-21: 10 mL
  Filled 2016-10-21: qty 10

## 2016-10-21 NOTE — Assessment & Plan Note (Signed)
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

## 2016-10-21 NOTE — Patient Instructions (Signed)
Dehydration, Adult Dehydration is a condition in which there is not enough fluid or water in the body. This happens when you lose more fluids than you take in. Important organs, such as the kidneys, brain, and heart, cannot function without a proper amount of fluids. Any loss of fluids from the body can lead to dehydration. Dehydration can range from mild to severe. This condition should be treated right away to prevent it from becoming severe. What are the causes? This condition may be caused by:  Vomiting.  Diarrhea.  Excessive sweating, such as from heat exposure or exercise.  Not drinking enough fluid, especially:  When ill.  While doing activity that requires a lot of energy.  Excessive urination.  Fever.  Infection.  Certain medicines, such as medicines that cause the body to lose excess fluid (diuretics).  Inability to access safe drinking water.  Reduced physical ability to get adequate water and food. What increases the risk? This condition is more likely to develop in people:  Who have a poorly controlled long-term (chronic) illness, such as diabetes, heart disease, or kidney disease.  Who are age 65 or older.  Who are disabled.  Who live in a place with high altitude.  Who play endurance sports. What are the signs or symptoms? Symptoms of mild dehydration may include:   Thirst.  Dry lips.  Slightly dry mouth.  Dry, warm skin.  Dizziness. Symptoms of moderate dehydration may include:   Very dry mouth.  Muscle cramps.  Dark urine. Urine may be the color of tea.  Decreased urine production.  Decreased tear production.  Heartbeat that is irregular or faster than normal (palpitations).  Headache.  Light-headedness, especially when you stand up from a sitting position.  Fainting (syncope). Symptoms of severe dehydration may include:   Changes in skin, such as:  Cold and clammy skin.  Blotchy (mottled) or pale skin.  Skin that does  not quickly return to normal after being lightly pinched and released (poor skin turgor).  Changes in body fluids, such as:  Extreme thirst.  No tear production.  Inability to sweat when body temperature is high, such as in hot weather.  Very little urine production.  Changes in vital signs, such as:  Weak pulse.  Pulse that is more than 100 beats a minute when sitting still.  Rapid breathing.  Low blood pressure.  Other changes, such as:  Sunken eyes.  Cold hands and feet.  Confusion.  Lack of energy (lethargy).  Difficulty waking up from sleep.  Short-term weight loss.  Unconsciousness. How is this diagnosed? This condition is diagnosed based on your symptoms and a physical exam. Blood and urine tests may be done to help confirm the diagnosis. How is this treated? Treatment for this condition depends on the severity. Mild or moderate dehydration can often be treated at home. Treatment should be started right away. Do not wait until dehydration becomes severe. Severe dehydration is an emergency and it needs to be treated in a hospital. Treatment for mild dehydration may include:   Drinking more fluids.  Replacing salts and minerals in your blood (electrolytes) that you may have lost. Treatment for moderate dehydration may include:   Drinking an oral rehydration solution (ORS). This is a drink that helps you replace fluids and electrolytes (rehydrate). It can be found at pharmacies and retail stores. Treatment for severe dehydration may include:   Receiving fluids through an IV tube.  Receiving an electrolyte solution through a feeding tube that is   passed through your nose and into your stomach (nasogastric tube, or NG tube).  Correcting any abnormalities in electrolytes.  Treating the underlying cause of dehydration. Follow these instructions at home:  If directed by your health care provider, drink an ORS:  Make an ORS by following instructions on the  package.  Start by drinking small amounts, about  cup (120 mL) every 5-10 minutes.  Slowly increase how much you drink until you have taken the amount recommended by your health care provider.  Drink enough clear fluid to keep your urine clear or pale yellow. If you were told to drink an ORS, finish the ORS first, then start slowly drinking other clear fluids. Drink fluids such as:  Water. Do not drink only water. Doing that can lead to having too little salt (sodium) in the body (hyponatremia).  Ice chips.  Fruit juice that you have added water to (diluted fruit juice).  Low-calorie sports drinks.  Avoid:  Alcohol.  Drinks that contain a lot of sugar. These include high-calorie sports drinks, fruit juice that is not diluted, and soda.  Caffeine.  Foods that are greasy or contain a lot of fat or sugar.  Take over-the-counter and prescription medicines only as told by your health care provider.  Do not take sodium tablets. This can lead to having too much sodium in the body (hypernatremia).  Eat foods that contain a healthy balance of electrolytes, such as bananas, oranges, potatoes, tomatoes, and spinach.  Keep all follow-up visits as told by your health care provider. This is important. Contact a health care provider if:  You have abdominal pain that:  Gets worse.  Stays in one area (localizes).  You have a rash.  You have a stiff neck.  You are more irritable than usual.  You are sleepier or more difficult to wake up than usual.  You feel weak or dizzy.  You feel very thirsty.  You have urinated only a small amount of very dark urine over 6-8 hours. Get help right away if:  You have symptoms of severe dehydration.  You cannot drink fluids without vomiting.  Your symptoms get worse with treatment.  You have a fever.  You have a severe headache.  You have vomiting or diarrhea that:  Gets worse.  Does not go away.  You have blood or green matter  (bile) in your vomit.  You have blood in your stool. This may cause stool to look black and tarry.  You have not urinated in 6-8 hours.  You faint.  Your heart rate while sitting still is over 100 beats a minute.  You have trouble breathing. This information is not intended to replace advice given to you by your health care provider. Make sure you discuss any questions you have with your health care provider. Document Released: 06/03/2005 Document Revised: 12/29/2015 Document Reviewed: 07/28/2015 Elsevier Interactive Patient Education  2017 [employer] Inc.  

## 2016-10-21 NOTE — Patient Instructions (Signed)

## 2016-10-21 NOTE — Assessment & Plan Note (Signed)
She has progressive malnutrition despite prednisone therapy and Remeron As above, we will give Megace I recommend dietitian follow-up

## 2016-10-21 NOTE — Assessment & Plan Note (Signed)
This could be due to poor oral intake I recommend increasing the dose of Florinef

## 2016-10-21 NOTE — Progress Notes (Signed)
[facility] OFFICE PROGRESS NOTE  Patient Care Team: Jacquelyne Balint, MD as PCP - General (Gynecologic Oncology)  SUMMARY OF ONCOLOGIC HISTORY:   Endometrial cancer [facility])   03/07/2014 Imaging    CT scan showed large uterine mass      03/07/2014 Initial Diagnosis    Surgery by Dr Jacquelyne Balint at [facility] in [place] on 04-06-14 TAH, BSO, omentectomy, pelvic and right common iliac node evaluation. 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 Bulk of the carcinoma was in lower uterine segment where it was within 0.1 cm of serosal surface. Cytology positive for adenocarcinoma on washings ([id number]). Surgical findings were significant for no paraaortic adenopathy apparent. Patient received 3 cycles of adjuvant chemotherapy in [facility] by Dr Sabra Heck from11-19-15 thru2-12-16, with neulasta      08/15/2014 Imaging    Interval hysterectomy. 2. Retroperitoneal low-density structures which are suspicious for necrotic adenopathy/metastasis. Left periaortic component is slightly decreased in size. However, there is new soft tissue thickening about the abdominal aorta more superiorly. Less likely differential considerations include benign lymphangioma/lymphangiomas and/or concurrent vasculitis. Consider PET for further evaluation. 3. Development of bilateral pleural effusions with left base atelectasis. 4. Indeterminate bibasilar pulmonary nodules, felt to be similar. Consider further evaluation with chest CT, as recommended on 03/25/2014. 5.  Possible constipation.      08/24/2014 - 10/21/2014 Chemotherapy    She received 2 cycles of carbo/taxol. Cycle 3 with [place] only       11/10/2014  Imaging    Prior periaortic lucency of concern at the hiatus has resolved and was probably transient and inflammatory or due to third spacing of fluid. The pericardial effusion and pleural effusions have also resolved. 2. Upper normal sized lymph node just above the right inferior pulmonary vein is new compared to the prior exam and may merit Observation. 3. Tiny scattered pulmonary nodules remain stable. 4.  Prominent stool throughout the colon favors constipation. 5.  Aortoiliac atherosclerotic vascular disease. 6. Degenerative arthropathy of the hips and degenerative disc disease in the lumbar spine. 7. Small ventral supraumbilical hernia contains adipose tissue. 8. Emphysema. 9. Left anterior descending coronary artery atherosclerosis. 10. Ascending thoracic aortic aneurysm.       12/25/2014 Imaging    No acute findings within the abdomen or pelvis. No evidence of recurrent or metastatic carcinoma. Large stool burden again demonstrated; suggest clinical correlation for possible constipation.       01/03/2015 - 01/26/2015 Radiation Therapy    July 19, July 27, July 29, August 9, August 11 Site/dose: Proximal vagina, 30 gray in 5 fractions      11/30/2015 Imaging    Significant progression of disease, as detailed above. This includes numerous pulmonary masses/nodules within the right lung, largest of which is at the right lung base measuring 3.1 x 2.6 cm. A new pleural based mass is now seen along the medial aspects of the left upper lobe measuring 2.6 x 1.7 cm. On earlier chest CT of 11/04/2014, the largest pulmonary nodule identified within the left lung was 6 mm and the largest pulmonary nodule identified within the right lung was 4 mm. 2. Mediastinal and perihilar lymphadenopathy, most numerous within the left lower paratracheal and aortopulmonary window regions, with largest lymph  nodes in the right perihilar region demonstrating evidence of central necrosis, almost certainly compatible with  metastatic lymphadenopathy throughout and further evidence of progression of disease. 3. Large left pleural effusion, likely malignant effusion, with adjacent compressive atelectasis. Heart is displaced to the right by the large left pleural effusion. 4. Emphysematous change, upper lobe predominant. 5. No definite evidence of osseous metastasis. Scattered tiny lucent foci within the thoracic vertebral bodies could conceivably represent early developing metastases.      11/30/2015 - 12/06/2015 Hospital Admission    She was admitted for severe shortness of breath and was found to have malignant effusion      12/01/2015 Imaging    As seen on yesterday's chest CT, there is metastatic disease to both lung bases with a large malignant left pleural effusion. There are several pleural-based metastases on the left and right infrahilar lymphadenopathy, incompletely visualized. 2. Left periaortic and left pelvic side wall lymphadenopathy consistent with metastatic disease. No other evidence of abdominal  pelvic metastatic disease. 3. No hydronephrosis.      12/04/2015 Procedure    Successful placement of a tunneled left pleural catheter with ultrasound and fluoroscopic guidance.       12/22/2015 Pathology Results    Pleura, peel, Left pleural peel - RARE MALIGNANT CELLS. - SEE MICROSCOPIC DESCRIPTION Microscopic Comment The specimen consists of abundant fibrin with rare minute aggregates of epithelial cells with malignant features consistent with poorly differentiated carcinoma.       12/22/2015 - 12/26/2015 Hospital Admission    She was admitted for management of malignant pleural effusion      12/22/2015 Surgery    OPERATION:  Left VATS (video-assisted thoracoscopic surgery) with drainage of loculated malignant effusion, chemical pleurodesis with doxycycline, placement of PleurX catheter.  PREOPERATIVE DIAGNOSES:  Loculated left malignant effusion, history of endometrial carcinoma, advanced  age.  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      01/04/2016 - 05/16/2016 Chemotherapy    She received carboplatin & Taxol       03/19/2016 Imaging    Interval response to therapy. 2. Decrease in volume of left pleural effusion. The index lesions within the chest have decreased in size in the interval. 3. Interval decrease in size of retroperitoneal and left pelvic side wall adenopathy. 4. Aortic atherosclerosis.       06/21/2016 Imaging    Apparent interval mixed response to therapy. Pleural disease in the chest has progressed along with progression of mediastinal lymphadenopathy. Some pulmonary nodules progressed while others have decreased in size in the interval. The index abdominal retroperitoneal left pelvic sidewall lymph nodes have decreased in the interval. 2. Persistent small left pleural effusion with posterior left base collapse/consolidation. 3.  Abdominal Aortic Atherosclerois       07/10/2016 Imaging    Left ventricle: The cavity size was normal. Wall thickness was increased in a pattern of mild LVH. Systolic function was normal. The estimated ejection fraction was in the range of 60% to 65%. Wall motion was normal; there were no regional wall motion abnormalities. Doppler parameters are consistent with abnormal left ventricular relaxation (grade 1 diastolic dysfunction).       07/11/2016 - 08/08/2016 Chemotherapy    She received Doxil only       07/25/2016 Adverse Reaction    She felt dehydrated, dizzy, had mucositis, constipated and miserable      09/02/2016 Imaging    Mild increase in mediastinal lymphadenopathy. Stable  mild bilateral hilar lymphadenopathy. Mild interval progression of bilateral pulmonary metastases. Stable small left pleural effusion. Stable mild left external iliac lymphadenopathy. No new or progressive metastatic disease within the abdomen or  pelvis      09/20/2016 -  Chemotherapy    The patient had palliative chemo with Abraxane      10/16/2016 Imaging    CT imaging: Mild progression of mediastinal lymphadenopathy in bilateral pulmonary metastases. Stable mild bilateral hilar lymphadenopathy. Stable mild left retrocrural and left external iliac lymphadenopathy. No new or progressive metastatic disease within the abdomen or pelvis. Moderate emphysema. Stable 4.0 cm ascending thoracic aortic aneurysm.       INTERVAL HISTORY: Please see below for problem oriented charting. She returns for further follow-up with her daughter Despite increasing the dose of prednisone, she has not eaten more She felt that the prednisone is interfering with her sleep She denies chest pain or shortness of breath The patient denies any recent signs or symptoms of bleeding such as spontaneous epistaxis, hematuria or hematochezia. Her oral intake is very very poor She denies pain, nausea or constipation She is very weak  REVIEW OF SYSTEMS:   Constitutional: Denies fevers, chills or abnormal weight loss Eyes: Denies blurriness of vision Ears, nose, mouth, throat, and face: Denies mucositis or sore throat Respiratory: Denies cough, dyspnea or wheezes Cardiovascular: Denies palpitation, chest discomfort or lower extremity swelling Gastrointestinal:  Denies nausea, heartburn or change in bowel habits Skin: Denies abnormal skin rashes Lymphatics: Denies new lymphadenopathy or easy bruising Behavioral/Psych: Mood is stable, no new changes  All other systems were reviewed with the patient and are negative.  I have reviewed the past medical history, past surgical history, social history and family history with the patient and they are unchanged from previous note.  ALLERGIES:  is allergic to carboplatin and levofloxacin.  MEDICATIONS:  Current Outpatient Prescriptions  Medication Sig Dispense Refill  . albuterol (PROVENTIL HFA;VENTOLIN HFA) 108 (90  Base) MCG/ACT inhaler Inhale 1 puff into the lungs every 6 (six) hours as needed for wheezing or shortness of breath.    . Calcium Carbonate-Vitamin D (CALCIUM 600+D) 600-400 MG-UNIT tablet Take 1 tablet by mouth daily.    . cholecalciferol (VITAMIN D) 1000 units tablet Take 1,000 Units by mouth daily.    . ferrous sulfate 325 (65 FE) MG tablet Take 325 mg by mouth daily with breakfast.     . fludrocortisone (FLORINEF) 0.1 MG tablet Take 2 tablets (0.2 mg total) by mouth daily. 90 tablet 3  . LORazepam (ATIVAN) 0.5 MG tablet Take 1 tablet (0.5 mg total) by mouth 2 (two) times daily as needed for anxiety. (Patient not taking: Reported on 10/14/2016) 30 tablet 0  . megestrol (MEGACE) 40 MG tablet Take 1 tablet (40 mg total) by mouth 2 (two) times daily. 60 tablet 1  . mirtazapine (REMERON) 15 MG tablet Take 1 tablet (15 mg total) by mouth at bedtime. (Patient not taking: Reported on 10/14/2016) 90 tablet 1  . Multiple Vitamin (MULTIVITAMIN WITH MINERALS) TABS tablet Take 1 tablet by mouth daily.    . polyethylene glycol (MIRALAX / GLYCOLAX) packet Take 17 g by mouth daily.     . predniSONE (DELTASONE) 50 MG tablet Take 1 tablet (50 mg total) by mouth daily with breakfast. 10 tablet 0  . traMADol (ULTRAM) 50 MG tablet Take 1 tablet (50 mg total) by mouth every 6 (six) hours as needed. (Patient taking differently: Take 50 mg by mouth every 6 (six) hours as  needed for moderate pain. ) 90 tablet 0  . vitamin B-12 (CYANOCOBALAMIN) 1000 MCG tablet Take 1,000 mcg by mouth daily.     No current facility-administered medications for this visit.    Facility-Administered Medications Ordered in Other Visits  Medication Dose Route Frequency Provider Last Rate Last Dose  . 0.9 %  sodium chloride infusion   Intravenous Once Taylour Lietzke, MD      . sodium chloride 0.9 % injection 10 mL  10 mL Intravenous PRN Livesay, Lennis P, MD   10 mL at 02/15/16 1601    PHYSICAL EXAMINATION: ECOG PERFORMANCE STATUS: 1 -  Symptomatic but completely ambulatory  Vitals:   10/21/16 1233  BP: (!) 79/49  Pulse: 86  Resp: 18  Temp: 98.4 F (36.9 C)   Filed Weights   10/21/16 1233  Weight: 129 lb 8 oz (58.7 kg)    GENERAL:alert, no distress and comfortable.  She looks thin and cachectic SKIN: skin color, texture, turgor are normal, no rashes or significant lesions EYES: normal, Conjunctiva are pink and non-injected, sclera clear Musculoskeletal:no cyanosis of digits and no clubbing  NEURO: alert & oriented x 3 with fluent speech, no focal motor/sensory deficits  LABORATORY DATA:  I have reviewed the data as listed    Component Value Date/Time   NA 136 10/21/2016 1151   K 4.5 10/21/2016 1151   CL 101 10/07/2016 1352   CO2 26 10/21/2016 1151   GLUCOSE 177 (H) 10/21/2016 1151   BUN 14.4 10/21/2016 1151   CREATININE 0.7 10/21/2016 1151   CALCIUM 10.0 10/21/2016 1151   PROT 8.7 (H) 10/21/2016 1151   ALBUMIN 1.9 (L) 10/21/2016 1151   AST 94 (H) 10/21/2016 1151   ALT 156 (H) 10/21/2016 1151   ALKPHOS 489 (H) 10/21/2016 1151   BILITOT 0.58 10/21/2016 1151   GFRNONAA >60 10/07/2016 1352   GFRAA >60 10/07/2016 1352    No results found for: SPEP, UPEP  Lab Results  Component Value Date   WBC 10.5 (H) 10/21/2016   NEUTROABS 9.6 (H) 10/21/2016   HGB 9.2 (L) 10/21/2016   HCT 28.1 (L) 10/21/2016   MCV 92.1 10/21/2016   PLT 484 (H) 10/21/2016      Chemistry      Component Value Date/Time   NA 136 10/21/2016 1151   K 4.5 10/21/2016 1151   CL 101 10/07/2016 1352   CO2 26 10/21/2016 1151   BUN 14.4 10/21/2016 1151   CREATININE 0.7 10/21/2016 1151      Component Value Date/Time   CALCIUM 10.0 10/21/2016 1151   ALKPHOS 489 (H) 10/21/2016 1151   AST 94 (H) 10/21/2016 1151   ALT 156 (H) 10/21/2016 1151   BILITOT 0.58 10/21/2016 1151       RADIOGRAPHIC STUDIES: I have personally reviewed the radiological images as listed and agreed with the findings in the report. Ct Chest W  Contrast  Result Date: 10/16/2016 CLINICAL DATA:  Followup metastatic endometrial carcinoma. Increased shortness of breath. Currently undergoing chemotherapy. Previous surgery and radiation therapy. EXAM: CT CHEST, ABDOMEN, AND PELVIS WITH CONTRAST TECHNIQUE: Multidetector CT imaging of the chest, abdomen and pelvis was performed following the standard protocol during bolus administration of intravenous contrast. CONTRAST:  177mL ISOVUE-300 IOPAMIDOL (ISOVUE-300) INJECTION 61% COMPARISON:  09/02/2016 FINDINGS: CT CHEST FINDINGS Cardiovascular: No acute findings. Stable 4.0 cm ascending thoracic aortic aneurysm. Mediastinum/Lymph Nodes: Mild increase in mediastinal lymphadenopathy since prior study. Index lesion in the lateral aortic region measures 2.7 x 4.4 cm on image 25/  2 compared to 2.5 x 3.7 cm previously. Index adenopathy in the subcarinal region measures 2.8 cm on image 38/ 2 compared to 2.2 cm previously. Mild bilateral hilar lymphadenopathy shows no significant change. No evidence of axillary or supraclavicular lymphadenopathy. Lungs/Pleura: Moderate emphysema again demonstrated. Bilateral pulmonary metastases involving left lung greater than right show interval increase since previous study. Index nodule in posterior right lower lobe currently measures 2.7 x 2.4 cm on image 119/4 compared to 2.4 x 1.9 cm previously. Index nodule in the superior left lower lobe abutting the major fissure measures 3.7 x 2.8 cm on image 51/4 compared to 2.4 x 2.1 cm previously. Tiny left pleural effusion is decreased in size since prior exam. Musculoskeletal:  No suspicious bone lesions identified. CT ABDOMEN AND PELVIS FINDINGS Hepatobiliary: No masses identified. Gallbladder is unremarkable. Pancreas:  No mass or inflammatory changes. Spleen:  Within normal limits in size and appearance. Adrenals/Urinary tract: Stable small bilateral renal cysts. No masses or hydronephrosis. Stomach/Bowel: No evidence of obstruction,  inflammatory process, or abnormal fluid collections. Vascular/Lymphatic: Mild left retrocrural lymphadenopathy measures 2.2 x 1.7 cm and is not significantly changed since prior study. Mild left external iliac lymphadenopathy is seen measuring 16 mm on image 99/ 2 compared to 15 mm previously. No new or increased lymphadenopathy within the abdomen or pelvis. No abdominal aortic aneurysm. Aortic atherosclerosis. Reproductive: Prior hysterectomy noted. Adnexal regions are unremarkable in appearance. Other:  None. Musculoskeletal: No suspicious bone lesions identified. Moderate left hip osteoarthritis. IMPRESSION: Mild progression of mediastinal lymphadenopathy in bilateral pulmonary metastases. Stable mild bilateral hilar lymphadenopathy. Stable mild left retrocrural and left external iliac lymphadenopathy. No new or progressive metastatic disease within the abdomen or pelvis. Moderate emphysema. Stable 4.0 cm ascending thoracic aortic aneurysm. Electronically Signed   By: Earle Gell M.D.   On: 10/16/2016 15:35   Ct Abdomen Pelvis W Contrast  Result Date: 10/16/2016 CLINICAL DATA:  Followup metastatic endometrial carcinoma. Increased shortness of breath. Currently undergoing chemotherapy. Previous surgery and radiation therapy. EXAM: CT CHEST, ABDOMEN, AND PELVIS WITH CONTRAST TECHNIQUE: Multidetector CT imaging of the chest, abdomen and pelvis was performed following the standard protocol during bolus administration of intravenous contrast. CONTRAST:  185mL ISOVUE-300 IOPAMIDOL (ISOVUE-300) INJECTION 61% COMPARISON:  09/02/2016 FINDINGS: CT CHEST FINDINGS Cardiovascular: No acute findings. Stable 4.0 cm ascending thoracic aortic aneurysm. Mediastinum/Lymph Nodes: Mild increase in mediastinal lymphadenopathy since prior study. Index lesion in the lateral aortic region measures 2.7 x 4.4 cm on image 25/ 2 compared to 2.5 x 3.7 cm previously. Index adenopathy in the subcarinal region measures 2.8 cm on image 38/ 2  compared to 2.2 cm previously. Mild bilateral hilar lymphadenopathy shows no significant change. No evidence of axillary or supraclavicular lymphadenopathy. Lungs/Pleura: Moderate emphysema again demonstrated. Bilateral pulmonary metastases involving left lung greater than right show interval increase since previous study. Index nodule in posterior right lower lobe currently measures 2.7 x 2.4 cm on image 119/4 compared to 2.4 x 1.9 cm previously. Index nodule in the superior left lower lobe abutting the major fissure measures 3.7 x 2.8 cm on image 51/4 compared to 2.4 x 2.1 cm previously. Tiny left pleural effusion is decreased in size since prior exam. Musculoskeletal:  No suspicious bone lesions identified. CT ABDOMEN AND PELVIS FINDINGS Hepatobiliary: No masses identified. Gallbladder is unremarkable. Pancreas:  No mass or inflammatory changes. Spleen:  Within normal limits in size and appearance. Adrenals/Urinary tract: Stable small bilateral renal cysts. No masses or hydronephrosis. Stomach/Bowel: No evidence of obstruction, inflammatory  process, or abnormal fluid collections. Vascular/Lymphatic: Mild left retrocrural lymphadenopathy measures 2.2 x 1.7 cm and is not significantly changed since prior study. Mild left external iliac lymphadenopathy is seen measuring 16 mm on image 99/ 2 compared to 15 mm previously. No new or increased lymphadenopathy within the abdomen or pelvis. No abdominal aortic aneurysm. Aortic atherosclerosis. Reproductive: Prior hysterectomy noted. Adnexal regions are unremarkable in appearance. Other:  None. Musculoskeletal: No suspicious bone lesions identified. Moderate left hip osteoarthritis. IMPRESSION: Mild progression of mediastinal lymphadenopathy in bilateral pulmonary metastases. Stable mild bilateral hilar lymphadenopathy. Stable mild left retrocrural and left external iliac lymphadenopathy. No new or progressive metastatic disease within the abdomen or pelvis. Moderate  emphysema. Stable 4.0 cm ascending thoracic aortic aneurysm. Electronically Signed   By: Earle Gell M.D.   On: 10/16/2016 15:35    ASSESSMENT & PLAN:  Endometrial cancer ([place]) As a long discussion with the patient and daughter She is very debilitated with unstable vital signs and it is not safe to pursue additional chemotherapy We discussed palliative care/hospice but she is not ready I recommend aggressive supportive care along with a trial of Megace Even though Megace are typically prescribed for patients with low-grade endometrial cancer, she may have some beneficial effect on cancer control along with appetite stimulant She agree with the plan of care The risks, benefits, side effects of Megace including risk of weight gain and blood clots were discussed We will start with 40 mg twice a day with plan to titrate up in a few weeks if she can tolerate that  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  Bone metastasis is another possibility although CT imaging showed no definitive evidence of bone metastasis To give the benefit of the doubt, I recommend frequent hydration, steroid treatment along with discontinuation of calcium and vitamin D supplementation If repeat serum calcium next week is high, she may need to be admitted for further management.  Cachexia ([place]) She has severe malignant cachexia Prednisone did not seem to increase appetite She was on Remeron and that did not help either I recommend a trial of Megace  Antineoplastic chemotherapy induced anemia This is likely due to recent treatment. The patient denies recent history of bleeding such as epistaxis, hematuria or hematochezia. She is asymptomatic from the anemia. I will observe for now.    Elevated liver enzymes She has severe abnormal liver enzymes  but CT scan show no evidence of liver involvement Could be due to malignant hypercalcemia I recommend IV fluid hydration and Megace as above  Arterial hypotension This could be due to poor oral intake I recommend increasing the dose of Florinef  Moderate malnutrition ([place]) She has progressive malnutrition despite prednisone therapy and Remeron As above, we will give Megace I recommend dietitian follow-up  Goals of care, counseling/discussion The patient is aware she has incurable disease and treatment is strictly palliative. We discussed importance of Advanced Directives and Living will. Her daughter is her medical power of attorney At this point in time, she is not ready to accept hospice care yet.   No orders of the defined types were placed in this encounter.  All questions were answered. The patient knows to call the clinic with any problems, questions or concerns. No barriers to learning was detected. I spent 30 minutes counseling the patient face to face.  The total time spent in the appointment was 40 minutes and more than 50% was on counseling and review of test results     Heath Lark, MD 10/21/2016 3:22 PM

## 2016-10-21 NOTE — Assessment & Plan Note (Signed)
She has hypercalcemia of malignancy Her albumin is low and the corrected serum calcium is high I suspect could be due to dehydration and poor oral intake along with her vitamin supplement.  Bone metastasis is another possibility although CT imaging showed no definitive evidence of bone metastasis To give the benefit of the doubt, I recommend frequent hydration, steroid treatment along with discontinuation of calcium and vitamin D supplementation If repeat serum calcium next week is high, she may need to be admitted for further management.

## 2016-10-21 NOTE — Assessment & Plan Note (Signed)
The patient is aware she has incurable disease and treatment is strictly palliative. We discussed importance of Advanced Directives and Living will. Her daughter is her medical power of attorney At this point in time, she is not ready to accept hospice care yet.

## 2016-10-21 NOTE — Telephone Encounter (Signed)
Scheduled appts per 5/7 los.  

## 2016-10-21 NOTE — Assessment & Plan Note (Signed)
This is likely due to recent treatment. The patient denies recent history of bleeding such as epistaxis, hematuria or hematochezia. She is asymptomatic from the anemia. I will observe for now.   

## 2016-10-21 NOTE — Assessment & Plan Note (Signed)
She has severe malignant cachexia Prednisone did not seem to increase appetite She was on Remeron and that did not help either I recommend a trial of Megace

## 2016-10-21 NOTE — Assessment & Plan Note (Signed)
As a long discussion with the patient and daughter She is very debilitated with unstable vital signs and it is not safe to pursue additional chemotherapy We discussed palliative care/hospice but she is not ready I recommend aggressive supportive care along with a trial of Megace Even though Megace are typically prescribed for patients with low-grade endometrial cancer, she may have some beneficial effect on cancer control along with appetite stimulant She agree with the plan of care The risks, benefits, side effects of Megace including risk of weight gain and blood clots were discussed We will start with 40 mg twice a day with plan to titrate up in a few weeks if she can tolerate that

## 2016-10-25 ENCOUNTER — Telehealth: Payer: Self-pay | Admitting: Hematology and Oncology

## 2016-10-25 NOTE — Telephone Encounter (Signed)
Rescheduled appts per MD and patients availability - patient to pick up new schedule next visit.

## 2016-10-28 ENCOUNTER — Ambulatory Visit: Payer: Medicare Other

## 2016-10-28 ENCOUNTER — Ambulatory Visit (HOSPITAL_BASED_OUTPATIENT_CLINIC_OR_DEPARTMENT_OTHER): Payer: Medicare Other

## 2016-10-28 ENCOUNTER — Other Ambulatory Visit (HOSPITAL_BASED_OUTPATIENT_CLINIC_OR_DEPARTMENT_OTHER): Payer: Medicare Other

## 2016-10-28 VITALS — BP 93/69 | HR 89 | Temp 97.9°F | Resp 16

## 2016-10-28 DIAGNOSIS — C541 Malignant neoplasm of endometrium: Secondary | ICD-10-CM

## 2016-10-28 LAB — COMPREHENSIVE METABOLIC PANEL
ALT: 82 U/L — AB (ref 0–55)
ANION GAP: 11 meq/L (ref 3–11)
AST: 46 U/L — ABNORMAL HIGH (ref 5–34)
Albumin: 1.7 g/dL — ABNORMAL LOW (ref 3.5–5.0)
Alkaline Phosphatase: 406 U/L — ABNORMAL HIGH (ref 40–150)
BUN: 11.2 mg/dL (ref 7.0–26.0)
CHLORIDE: 100 meq/L (ref 98–109)
CO2: 24 mEq/L (ref 22–29)
CREATININE: 0.7 mg/dL (ref 0.6–1.1)
Calcium: 9.6 mg/dL (ref 8.4–10.4)
EGFR: >90 mL/min/{1.73_m2} (ref >90)
Glucose: 201 mg/dl — ABNORMAL HIGH (ref 70–140)
POTASSIUM: 3.9 meq/L (ref 3.5–5.1)
Sodium: 135 mEq/L — ABNORMAL LOW (ref 136–145)
Total Bilirubin: 0.74 mg/dL (ref 0.20–1.20)
Total Protein: 8.4 g/dL — ABNORMAL HIGH (ref 6.4–8.3)

## 2016-10-28 LAB — CBC WITH DIFFERENTIAL/PLATELET
BASO%: 0 % (ref 0.0–2.0)
Basophils Absolute: 0 10*3/uL (ref 0.0–0.1)
EOS ABS: 0 10*3/uL (ref 0.0–0.5)
EOS%: 0.1 % (ref 0.0–7.0)
HCT: 28.3 % — ABNORMAL LOW (ref 34.8–46.6)
HGB: 8.8 g/dL — ABNORMAL LOW (ref 11.6–15.9)
LYMPH%: 3.4 % — AB (ref 14.0–49.7)
MCH: 28.9 pg (ref 25.1–34.0)
MCHC: 31.1 g/dL — AB (ref 31.5–36.0)
MCV: 92.8 fL (ref 79.5–101.0)
MONO#: 0.4 10*3/uL (ref 0.1–0.9)
MONO%: 2.8 % (ref 0.0–14.0)
NEUT%: 93.7 % — ABNORMAL HIGH (ref 38.4–76.8)
NEUTROS ABS: 12.4 10*3/uL — AB (ref 1.5–6.5)
PLATELETS: 393 10*3/uL (ref 145–400)
RBC: 3.05 10*6/uL — AB (ref 3.70–5.45)
RDW: 20.8 % — AB (ref 11.2–14.5)
WBC: 13.2 10*3/uL — AB (ref 3.9–10.3)
lymph#: 0.5 10*3/uL — ABNORMAL LOW (ref 0.9–3.3)
nRBC: 0 % (ref 0–0)

## 2016-10-28 MED ORDER — SODIUM CHLORIDE 0.9% FLUSH
10.0000 mL | Freq: Once | INTRAVENOUS | Status: AC
  Administered 2016-10-28: 10 mL
  Filled 2016-10-28: qty 10

## 2016-10-28 MED ORDER — HEPARIN SOD (PORK) LOCK FLUSH 100 UNIT/ML IV SOLN
500.0000 [IU] | Freq: Once | INTRAVENOUS | Status: AC | PRN
  Administered 2016-10-28: 500 [IU]
  Filled 2016-10-28: qty 5

## 2016-10-28 MED ORDER — SODIUM CHLORIDE 0.9 % IV SOLN
1000.0000 mL | Freq: Once | INTRAVENOUS | Status: AC
  Administered 2016-10-28: 1000 mL via INTRAVENOUS

## 2016-10-28 MED ORDER — SODIUM CHLORIDE 0.9% FLUSH
10.0000 mL | INTRAVENOUS | Status: DC | PRN
  Administered 2016-10-28: 10 mL
  Filled 2016-10-28: qty 10

## 2016-10-28 NOTE — Patient Instructions (Signed)
Dehydration, Adult Dehydration is a condition in which there is not enough fluid or water in the body. This happens when you lose more fluids than you take in. Important organs, such as the kidneys, brain, and heart, cannot function without a proper amount of fluids. Any loss of fluids from the body can lead to dehydration. Dehydration can range from mild to severe. This condition should be treated right away to prevent it from becoming severe. What are the causes? This condition may be caused by:  Vomiting.  Diarrhea.  Excessive sweating, such as from heat exposure or exercise.  Not drinking enough fluid, especially:  When ill.  While doing activity that requires a lot of energy.  Excessive urination.  Fever.  Infection.  Certain medicines, such as medicines that cause the body to lose excess fluid (diuretics).  Inability to access safe drinking water.  Reduced physical ability to get adequate water and food. What increases the risk? This condition is more likely to develop in people:  Who have a poorly controlled long-term (chronic) illness, such as diabetes, heart disease, or kidney disease.  Who are age 65 or older.  Who are disabled.  Who live in a place with high altitude.  Who play endurance sports. What are the signs or symptoms? Symptoms of mild dehydration may include:   Thirst.  Dry lips.  Slightly dry mouth.  Dry, warm skin.  Dizziness. Symptoms of moderate dehydration may include:   Very dry mouth.  Muscle cramps.  Dark urine. Urine may be the color of tea.  Decreased urine production.  Decreased tear production.  Heartbeat that is irregular or faster than normal (palpitations).  Headache.  Light-headedness, especially when you stand up from a sitting position.  Fainting (syncope). Symptoms of severe dehydration may include:   Changes in skin, such as:  Cold and clammy skin.  Blotchy (mottled) or pale skin.  Skin that does  not quickly return to normal after being lightly pinched and released (poor skin turgor).  Changes in body fluids, such as:  Extreme thirst.  No tear production.  Inability to sweat when body temperature is high, such as in hot weather.  Very little urine production.  Changes in vital signs, such as:  Weak pulse.  Pulse that is more than 100 beats a minute when sitting still.  Rapid breathing.  Low blood pressure.  Other changes, such as:  Sunken eyes.  Cold hands and feet.  Confusion.  Lack of energy (lethargy).  Difficulty waking up from sleep.  Short-term weight loss.  Unconsciousness. How is this diagnosed? This condition is diagnosed based on your symptoms and a physical exam. Blood and urine tests may be done to help confirm the diagnosis. How is this treated? Treatment for this condition depends on the severity. Mild or moderate dehydration can often be treated at home. Treatment should be started right away. Do not wait until dehydration becomes severe. Severe dehydration is an emergency and it needs to be treated in a hospital. Treatment for mild dehydration may include:   Drinking more fluids.  Replacing salts and minerals in your blood (electrolytes) that you may have lost. Treatment for moderate dehydration may include:   Drinking an oral rehydration solution (ORS). This is a drink that helps you replace fluids and electrolytes (rehydrate). It can be found at pharmacies and retail stores. Treatment for severe dehydration may include:   Receiving fluids through an IV tube.  Receiving an electrolyte solution through a feeding tube that is   passed through your nose and into your stomach (nasogastric tube, or NG tube).  Correcting any abnormalities in electrolytes.  Treating the underlying cause of dehydration. Follow these instructions at home:  If directed by your health care provider, drink an ORS:  Make an ORS by following instructions on the  package.  Start by drinking small amounts, about  cup (120 mL) every 5-10 minutes.  Slowly increase how much you drink until you have taken the amount recommended by your health care provider.  Drink enough clear fluid to keep your urine clear or pale yellow. If you were told to drink an ORS, finish the ORS first, then start slowly drinking other clear fluids. Drink fluids such as:  Water. Do not drink only water. Doing that can lead to having too little salt (sodium) in the body (hyponatremia).  Ice chips.  Fruit juice that you have added water to (diluted fruit juice).  Low-calorie sports drinks.  Avoid:  Alcohol.  Drinks that contain a lot of sugar. These include high-calorie sports drinks, fruit juice that is not diluted, and soda.  Caffeine.  Foods that are greasy or contain a lot of fat or sugar.  Take over-the-counter and prescription medicines only as told by your health care provider.  Do not take sodium tablets. This can lead to having too much sodium in the body (hypernatremia).  Eat foods that contain a healthy balance of electrolytes, such as bananas, oranges, potatoes, tomatoes, and spinach.  Keep all follow-up visits as told by your health care provider. This is important. Contact a health care provider if:  You have abdominal pain that:  Gets worse.  Stays in one area (localizes).  You have a rash.  You have a stiff neck.  You are more irritable than usual.  You are sleepier or more difficult to wake up than usual.  You feel weak or dizzy.  You feel very thirsty.  You have urinated only a small amount of very dark urine over 6-8 hours. Get help right away if:  You have symptoms of severe dehydration.  You cannot drink fluids without vomiting.  Your symptoms get worse with treatment.  You have a fever.  You have a severe headache.  You have vomiting or diarrhea that:  Gets worse.  Does not go away.  You have blood or green matter  (bile) in your vomit.  You have blood in your stool. This may cause stool to look black and tarry.  You have not urinated in 6-8 hours.  You faint.  Your heart rate while sitting still is over 100 beats a minute.  You have trouble breathing. This information is not intended to replace advice given to you by your health care provider. Make sure you discuss any questions you have with your health care provider. Document Released: 06/03/2005 Document Revised: 12/29/2015 Document Reviewed: 07/28/2015 Elsevier Interactive Patient Education  2017 [employer] Inc.  

## 2016-10-31 ENCOUNTER — Ambulatory Visit (HOSPITAL_BASED_OUTPATIENT_CLINIC_OR_DEPARTMENT_OTHER): Payer: Medicare Other

## 2016-10-31 VITALS — BP 100/63 | HR 97 | Temp 98.0°F | Resp 18

## 2016-10-31 DIAGNOSIS — C541 Malignant neoplasm of endometrium: Secondary | ICD-10-CM

## 2016-10-31 MED ORDER — HEPARIN SOD (PORK) LOCK FLUSH 100 UNIT/ML IV SOLN
500.0000 [IU] | Freq: Once | INTRAVENOUS | Status: AC | PRN
  Administered 2016-10-31: 500 [IU]
  Filled 2016-10-31: qty 5

## 2016-10-31 MED ORDER — SODIUM CHLORIDE 0.9% FLUSH
10.0000 mL | INTRAVENOUS | Status: DC | PRN
  Administered 2016-10-31: 10 mL
  Filled 2016-10-31: qty 10

## 2016-10-31 MED ORDER — SODIUM CHLORIDE 0.9 % IV SOLN
Freq: Once | INTRAVENOUS | Status: AC
  Administered 2016-10-31: 14:00:00 via INTRAVENOUS

## 2016-10-31 NOTE — Patient Instructions (Signed)

## 2016-11-04 ENCOUNTER — Telehealth: Payer: Self-pay | Admitting: Hematology and Oncology

## 2016-11-04 ENCOUNTER — Ambulatory Visit: Payer: Medicare Other | Admitting: Nutrition

## 2016-11-04 ENCOUNTER — Other Ambulatory Visit (HOSPITAL_BASED_OUTPATIENT_CLINIC_OR_DEPARTMENT_OTHER): Payer: Medicare Other

## 2016-11-04 ENCOUNTER — Encounter: Payer: Self-pay | Admitting: Hematology and Oncology

## 2016-11-04 ENCOUNTER — Ambulatory Visit (HOSPITAL_BASED_OUTPATIENT_CLINIC_OR_DEPARTMENT_OTHER): Payer: Medicare Other | Admitting: Hematology and Oncology

## 2016-11-04 ENCOUNTER — Other Ambulatory Visit: Payer: Medicaid Other

## 2016-11-04 ENCOUNTER — Ambulatory Visit: Payer: Medicare Other

## 2016-11-04 ENCOUNTER — Ambulatory Visit (HOSPITAL_COMMUNITY)
Admission: RE | Admit: 2016-11-04 | Discharge: 2016-11-04 | Disposition: A | Discharge status: Home / Self Care | Payer: Medicare Other | Source: Ambulatory Visit | Attending: Hematology and Oncology | Admitting: Hematology and Oncology

## 2016-11-04 ENCOUNTER — Other Ambulatory Visit: Payer: Self-pay | Admitting: Hematology and Oncology

## 2016-11-04 ENCOUNTER — Ambulatory Visit (HOSPITAL_BASED_OUTPATIENT_CLINIC_OR_DEPARTMENT_OTHER): Payer: Medicare Other

## 2016-11-04 VITALS — BP 85/57 | HR 106 | Temp 99.0°F | Resp 21 | Ht 66.0 in | Wt 131.2 lb

## 2016-11-04 VITALS — BP 89/63 | HR 90 | Temp 98.0°F | Resp 16

## 2016-11-04 DIAGNOSIS — D630 Anemia in neoplastic disease: Secondary | ICD-10-CM

## 2016-11-04 DIAGNOSIS — D6481 Anemia due to antineoplastic chemotherapy: Secondary | ICD-10-CM | POA: Diagnosis not present

## 2016-11-04 DIAGNOSIS — G893 Neoplasm related pain (acute) (chronic): Secondary | ICD-10-CM

## 2016-11-04 DIAGNOSIS — I951 Orthostatic hypotension: Secondary | ICD-10-CM

## 2016-11-04 DIAGNOSIS — R64 Cachexia: Secondary | ICD-10-CM

## 2016-11-04 DIAGNOSIS — C541 Malignant neoplasm of endometrium: Secondary | ICD-10-CM

## 2016-11-04 DIAGNOSIS — C7800 Secondary malignant neoplasm of unspecified lung: Secondary | ICD-10-CM | POA: Diagnosis not present

## 2016-11-04 DIAGNOSIS — T451X5A Adverse effect of antineoplastic and immunosuppressive drugs, initial encounter: Secondary | ICD-10-CM

## 2016-11-04 LAB — CBC WITH DIFFERENTIAL/PLATELET
BASO%: 0.1 % (ref 0.0–2.0)
Basophils Absolute: 0 10*3/uL (ref 0.0–0.1)
EOS%: 0.6 % (ref 0.0–7.0)
Eosinophils Absolute: 0.1 10*3/uL (ref 0.0–0.5)
HCT: 25.6 % — ABNORMAL LOW (ref 34.8–46.6)
HGB: 7.9 g/dL — ABNORMAL LOW (ref 11.6–15.9)
LYMPH%: 8.8 % — AB (ref 14.0–49.7)
MCH: 28.4 pg (ref 25.1–34.0)
MCHC: 30.9 g/dL — AB (ref 31.5–36.0)
MCV: 92.1 fL (ref 79.5–101.0)
MONO#: 0.5 10*3/uL (ref 0.1–0.9)
MONO%: 6.3 % (ref 0.0–14.0)
NEUT%: 84.2 % — ABNORMAL HIGH (ref 38.4–76.8)
NEUTROS ABS: 7.1 10*3/uL — AB (ref 1.5–6.5)
Platelets: 334 10*3/uL (ref 145–400)
RBC: 2.78 10*6/uL — AB (ref 3.70–5.45)
RDW: 20.9 % — ABNORMAL HIGH (ref 11.2–14.5)
WBC: 8.4 10*3/uL (ref 3.9–10.3)
lymph#: 0.7 10*3/uL — ABNORMAL LOW (ref 0.9–3.3)
nRBC: 0 % (ref 0–0)

## 2016-11-04 LAB — COMPREHENSIVE METABOLIC PANEL
ALT: 29 U/L (ref 0–55)
AST: 26 U/L (ref 5–34)
Albumin: 1.6 g/dL — ABNORMAL LOW (ref 3.5–5.0)
Alkaline Phosphatase: 240 U/L — ABNORMAL HIGH (ref 40–150)
Anion Gap: 13 mEq/L — ABNORMAL HIGH (ref 3–11)
BUN: 9.6 mg/dL (ref 7.0–26.0)
CHLORIDE: 102 meq/L (ref 98–109)
CO2: 23 mEq/L (ref 22–29)
CREATININE: 0.7 mg/dL (ref 0.6–1.1)
Calcium: 9.1 mg/dL (ref 8.4–10.4)
EGFR: >90 mL/min/{1.73_m2} (ref >90)
Glucose: 176 mg/dl — ABNORMAL HIGH (ref 70–140)
Potassium: 3.7 mEq/L (ref 3.5–5.1)
Sodium: 138 mEq/L (ref 136–145)
Total Bilirubin: 0.7 mg/dL (ref 0.20–1.20)
Total Protein: 8 g/dL (ref 6.4–8.3)

## 2016-11-04 MED ORDER — DIPHENHYDRAMINE HCL 25 MG PO CAPS: 25.0000 mg | ORAL_CAPSULE | Freq: Once | ORAL | Status: DC

## 2016-11-04 MED ORDER — ACETAMINOPHEN 325 MG PO TABS
ORAL_TABLET | ORAL | Status: AC
  Filled 2016-11-04: qty 2

## 2016-11-04 MED ORDER — HEPARIN SOD (PORK) LOCK FLUSH 100 UNIT/ML IV SOLN
500.0000 [IU] | Freq: Once | INTRAVENOUS | Status: AC | PRN
  Administered 2016-11-04: 500 [IU]
  Filled 2016-11-04: qty 5

## 2016-11-04 MED ORDER — SODIUM CHLORIDE 0.9% FLUSH
10.0000 mL | INTRAVENOUS | Status: DC | PRN
  Administered 2016-11-04: 10 mL
  Filled 2016-11-04: qty 10

## 2016-11-04 MED ORDER — SODIUM CHLORIDE 0.9 % IV SOLN
1000.0000 mL | Freq: Once | INTRAVENOUS | Status: AC
  Administered 2016-11-04: 1000 mL via INTRAVENOUS

## 2016-11-04 MED ORDER — SODIUM CHLORIDE 0.9% FLUSH
10.0000 mL | INTRAVENOUS | Status: DC | PRN
Start: 2016-11-04 — End: 2016-11-04
  Administered 2016-11-04: 10 mL
  Filled 2016-11-04: qty 10

## 2016-11-04 MED ORDER — ACETAMINOPHEN 325 MG PO TABS
650.0000 mg | ORAL_TABLET | Freq: Once | ORAL | Status: AC
  Administered 2016-11-04: 650 mg via ORAL

## 2016-11-04 NOTE — Patient Instructions (Signed)
Blood Transfusion, Care After This sheet gives you information about how to care for yourself after your procedure. Your doctor may also give you more specific instructions. If you have problems or questions, contact your doctor. Follow these instructions at home:  Take over-the-counter and prescription medicines only as told by your doctor.  Go back to your normal activities as told by your doctor.  Follow instructions from your doctor about how to take care of the area where an IV tube was put into your vein (insertion site). Make sure you:  Wash your hands with soap and water before you change your bandage (dressing). If there is no soap and water, use hand sanitizer.  Change your bandage as told by your doctor.  Check your IV insertion site every day for signs of infection. Check for:  More redness, swelling, or pain.  More fluid or blood.  Warmth.  Pus or a bad smell. Contact a doctor if:  You have more redness, swelling, or pain around the IV insertion site..  You have more fluid or blood coming from the IV insertion site.  Your IV insertion site feels warm to the touch.  You have pus or a bad smell coming from the IV insertion site.  Your pee (urine) turns pink, red, or brown.  You feel weak after doing your normal activities. Get help right away if:  You have signs of a serious allergic or body defense (immune) system reaction, including:  Itchiness.  Hives.  Trouble breathing.  Anxiety.  Pain in your chest or lower back.  Fever, flushing, and chills.  Fast pulse.  Rash.  Watery poop (diarrhea).  Throwing up (vomiting).  Dark pee.  Serious headache.  Dizziness.  Stiff neck.  Yellow color in your face or the white parts of your eyes (jaundice). Summary  After a blood transfusion, return to your normal activities as told by your doctor.  Every day, check for signs of infection where the IV tube was put into your vein.  Some signs of  infection are warm skin, more redness and pain, more fluid or blood, and pus or a bad smell where the needle went in.  Contact your doctor if you feel weak or have any unusual symptoms. This information is not intended to replace advice given to you by your health care provider. Make sure you discuss any questions you have with your health care provider. Document Released: 06/24/2014 Document Revised: 01/26/2016 Document Reviewed: 01/26/2016 Elsevier Interactive Patient Education  2017 [employer] .    Dehydration, Adult Dehydration is a condition in which there is not enough fluid or water in the body. This happens when you lose more fluids than you take in. Important organs, such as the kidneys, brain, and heart, cannot function without a proper amount of fluids. Any loss of fluids from the body can lead to dehydration. Dehydration can range from mild to severe. This condition should be treated right away to prevent it from becoming severe. What are the causes? This condition may be caused by:  Vomiting.  Diarrhea.  Excessive sweating, such as from heat exposure or exercise.  Not drinking enough fluid, especially:  When ill.  While doing activity that requires a lot of energy.  Excessive urination.  Fever.  Infection.  Certain medicines, such as medicines that cause the body to lose excess fluid (diuretics).  Inability to access safe drinking water.  Reduced physical ability to get adequate water and food. What increases the risk? This condition  is more likely to develop in people:  Who have a poorly controlled long-term (chronic) illness, such as diabetes, heart disease, or kidney disease.  Who are age 1 or older.  Who are disabled.  Who live in a place with high altitude.  Who play endurance sports. What are the signs or symptoms? Symptoms of mild dehydration may include:   Thirst.  Dry lips.  Slightly dry mouth.  Dry, warm  skin.  Dizziness. Symptoms of moderate dehydration may include:   Very dry mouth.  Muscle cramps.  Dark urine. Urine may be the color of tea.  Decreased urine production.  Decreased tear production.  Heartbeat that is irregular or faster than normal (palpitations).  Headache.  Light-headedness, especially when you stand up from a sitting position.  Fainting (syncope). Symptoms of severe dehydration may include:   Changes in skin, such as:  Cold and clammy skin.  Blotchy (mottled) or pale skin.  Skin that does not quickly return to normal after being lightly pinched and released (poor skin turgor).  Changes in body fluids, such as:  Extreme thirst.  No tear production.  Inability to sweat when body temperature is high, such as in hot weather.  Very little urine production.  Changes in vital signs, such as:  Weak pulse.  Pulse that is more than 100 beats a minute when sitting still.  Rapid breathing.  Low blood pressure.  Other changes, such as:  Sunken eyes.  Cold hands and feet.  Confusion.  Lack of energy (lethargy).  Difficulty waking up from sleep.  Short-term weight loss.  Unconsciousness. How is this diagnosed? This condition is diagnosed based on your symptoms and a physical exam. Blood and urine tests may be done to help confirm the diagnosis. How is this treated? Treatment for this condition depends on the severity. Mild or moderate dehydration can often be treated at home. Treatment should be started right away. Do not wait until dehydration becomes severe. Severe dehydration is an emergency and it needs to be treated in a hospital. Treatment for mild dehydration may include:   Drinking more fluids.  Replacing salts and minerals in your blood (electrolytes) that you may have lost. Treatment for moderate dehydration may include:   Drinking an oral rehydration solution (ORS). This is a drink that helps you replace fluids and  electrolytes (rehydrate). It can be found at pharmacies and retail stores. Treatment for severe dehydration may include:   Receiving fluids through an IV tube.  Receiving an electrolyte solution through a feeding tube that is passed through your nose and into your stomach (nasogastric tube, or NG tube).  Correcting any abnormalities in electrolytes.  Treating the underlying cause of dehydration. Follow these instructions at home:  If directed by your health care provider, drink an ORS:  Make an ORS by following instructions on the package.  Start by drinking small amounts, about  cup (120 mL) every 5-10 minutes.  Slowly increase how much you drink until you have taken the amount recommended by your health care provider.  Drink enough clear fluid to keep your urine clear or pale yellow. If you were told to drink an ORS, finish the ORS first, then start slowly drinking other clear fluids. Drink fluids such as:  Water. Do not drink only water. Doing that can lead to having too little salt (sodium) in the body (hyponatremia).  Ice chips.  Fruit juice that you have added water to (diluted fruit juice).  Low-calorie sports drinks.  Avoid:  Alcohol.  Drinks that contain a lot of sugar. These include high-calorie sports drinks, fruit juice that is not diluted, and soda.  Caffeine.  Foods that are greasy or contain a lot of fat or sugar.  Take over-the-counter and prescription medicines only as told by your health care provider.  Do not take sodium tablets. This can lead to having too much sodium in the body (hypernatremia).  Eat foods that contain a healthy balance of electrolytes, such as bananas, oranges, potatoes, tomatoes, and spinach.  Keep all follow-up visits as told by your health care provider. This is important. Contact a health care provider if:  You have abdominal pain that:  Gets worse.  Stays in one area (localizes).  You have a rash.  You have a stiff  neck.  You are more irritable than usual.  You are sleepier or more difficult to wake up than usual.  You feel weak or dizzy.  You feel very thirsty.  You have urinated only a small amount of very dark urine over 6-8 hours. Get help right away if:  You have symptoms of severe dehydration.  You cannot drink fluids without vomiting.  Your symptoms get worse with treatment.  You have a fever.  You have a severe headache.  You have vomiting or diarrhea that:  Gets worse.  Does not go away.  You have blood or green matter (bile) in your vomit.  You have blood in your stool. This may cause stool to look black and tarry.  You have not urinated in 6-8 hours.  You faint.  Your heart rate while sitting still is over 100 beats a minute.  You have trouble breathing. This information is not intended to replace advice given to you by your health care provider. Make sure you discuss any questions you have with your health care provider. Document Released: 06/03/2005 Document Revised: 12/29/2015 Document Reviewed: 07/28/2015 Elsevier Interactive Patient Education  2017 [employer].

## 2016-11-04 NOTE — Assessment & Plan Note (Signed)
As a long discussion with the patient and daughter She is very debilitated with unstable vital signs and it is not safe to pursue additional chemotherapy We discussed palliative care/hospice but she is not ready I recommend aggressive supportive care along with a trial of Megace Even though Megace are typically prescribed for patients with low-grade endometrial cancer, she may have some beneficial effect on cancer control along with appetite stimulant She agree with the plan of care The risks, benefits, side effects of Megace including risk of weight gain and blood clots were discussed She appears to tolerate Megace at 40 mg twice a day I recommend increasing Megace to 80 mg twice a day

## 2016-11-04 NOTE — Telephone Encounter (Signed)
Gave dtr avs report and appointments for May and June.

## 2016-11-04 NOTE — Patient Instructions (Signed)

## 2016-11-04 NOTE — Progress Notes (Signed)
Nutrition follow-up completed with patient in the infusion room for metastatic endometrial cancer. Weight documented as 131.2 pounds on May 21 improved 129.5 pounds the seventh. Patient reports poor appetite. She is staying with a family member who does cook for her. She is drinking boost once daily.  Nutrition diagnosis: Inadequate oral intake continues.  Intervention: Educated patient to continue small frequent meals and snacks with high-calorie, high-protein foods. Provided list of soft protein foods. Recommended patient try to increase boost between meals 3 times a day and provided additional samples and coupons. Teach back method used.  Monitoring, evaluation, goals:  Patient will tolerate increased calories and protein to improve quality-of-life and maintain current weight.  Next visit: To be scheduled as needed.  **Disclaimer: This note was dictated with voice recognition software. Similar sounding words can inadvertently be transcribed and this note may contain transcription errors which may not have been corrected upon publication of note.**

## 2016-11-04 NOTE — Assessment & Plan Note (Signed)
We discussed some of the risks, benefits, and alternatives of blood transfusions. The patient is symptomatic from anemia and the hemoglobin level is critically low.  Some of the side-effects to be expected including risks of transfusion reactions, chills, infection, syndrome of volume overload and risk of hospitalization from various reasons and the patient is willing to proceed and went ahead to sign consent today. I recommend 1 unit of blood transfusion today and she agreed

## 2016-11-04 NOTE — Assessment & Plan Note (Signed)
She has severe malignant cachexia Prednisone did not seem to increase appetite She was on Remeron and that did not help either I recommend a trial of Megace and this appeared to be helpful I recommend increasing Megace to twice a day, 80 mg  dose.

## 2016-11-04 NOTE — Assessment & Plan Note (Signed)
She is currently asymptomatic.  Monitor closely

## 2016-11-04 NOTE — Progress Notes (Signed)
33 - Dr. Burr Medico okay to release pt with BP 89/63. Pt asymptomatic at this time other than fatigue. Reinforced importance to notify us if dizziness, fatigue, pain, or any other symptoms persist or worsen before next appt on Thursday (5/24). Pt verbalized understanding. BP 89/63 appears to be pt baseline in the recent past.

## 2016-11-05 LAB — TYPE AND SCREEN
ABO/RH(D): O POS
ANTIBODY SCREEN: NEGATIVE
UNIT DIVISION: 0

## 2016-11-05 LAB — BPAM RBC
BLOOD PRODUCT EXPIRATION DATE: 201806122359
ISSUE DATE / TIME: 201805211509
UNIT TYPE AND RH: 5100

## 2016-11-05 NOTE — Progress Notes (Signed)
[facility] OFFICE PROGRESS NOTE  Patient Care Team: Jacquelyne Balint, MD as PCP - General (Gynecologic Oncology)  SUMMARY OF ONCOLOGIC HISTORY:   Endometrial cancer [facility])   03/07/2014 Imaging    CT scan showed large uterine mass      03/07/2014 Initial Diagnosis    Surgery by Dr Jacquelyne Balint at [facility] in [place] on 04-06-14 TAH, BSO, omentectomy, pelvic and right common iliac node evaluation. 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 Bulk of the carcinoma was in lower uterine segment where it was within 0.1 cm of serosal surface. Cytology positive for adenocarcinoma on washings ([id number]). Surgical findings were significant for no paraaortic adenopathy apparent. Patient received 3 cycles of adjuvant chemotherapy in [place] by Dr Sabra Heck from11-19-15 thru2-12-16, with neulasta      08/15/2014 Imaging    Interval hysterectomy. 2. Retroperitoneal low-density structures which are suspicious for necrotic adenopathy/metastasis. Left periaortic component is slightly decreased in size. However, there is new soft tissue thickening about the abdominal aorta more superiorly. Less likely differential considerations include benign lymphangioma/lymphangiomas and/or concurrent vasculitis. Consider PET for further evaluation. 3. Development of bilateral pleural effusions with left base atelectasis. 4. Indeterminate bibasilar pulmonary nodules, felt to be similar. Consider further evaluation with chest CT, as recommended on 03/25/2014. 5.  Possible constipation.      08/24/2014 - 10/21/2014 Chemotherapy    She received 2 cycles of carbo/taxol. Cycle 3 with [place] only       11/10/2014  Imaging    Prior periaortic lucency of concern at the hiatus has resolved and was probably transient and inflammatory or due to third spacing of fluid. The pericardial effusion and pleural effusions have also resolved. 2. Upper normal sized lymph node just above the right inferior pulmonary vein is new compared to the prior exam and may merit Observation. 3. Tiny scattered pulmonary nodules remain stable. 4.  Prominent stool throughout the colon favors constipation. 5.  Aortoiliac atherosclerotic vascular disease. 6. Degenerative arthropathy of the hips and degenerative disc disease in the lumbar spine. 7. Small ventral supraumbilical hernia contains adipose tissue. 8. Emphysema. 9. Left anterior descending coronary artery atherosclerosis. 10. Ascending thoracic aortic aneurysm.       12/25/2014 Imaging    No acute findings within the abdomen or pelvis. No evidence of recurrent or metastatic carcinoma. Large stool burden again demonstrated; suggest clinical correlation for possible constipation.       01/03/2015 - 01/26/2015 Radiation Therapy    July 19, July 27, July 29, August 9, August 11 Site/dose: Proximal vagina, 30 gray in 5 fractions      11/30/2015 Imaging    Significant progression of disease, as detailed above. This includes numerous pulmonary masses/nodules within the right lung, largest of which is at the right lung base measuring 3.1 x 2.6 cm. A new pleural based mass is now seen along the medial aspects of the left upper lobe measuring 2.6 x 1.7 cm. On earlier chest CT of 11/04/2014, the largest pulmonary nodule identified within the left lung was 6 mm and the largest pulmonary nodule identified within the right lung was 4 mm. 2. Mediastinal and perihilar lymphadenopathy, most numerous within the left lower paratracheal and aortopulmonary window regions, with largest lymph  nodes in the right perihilar region demonstrating evidence of central necrosis, almost certainly compatible with  metastatic lymphadenopathy throughout and further evidence of progression of disease. 3. Large left pleural effusion, likely malignant effusion, with adjacent compressive atelectasis. Heart is displaced to the right by the large left pleural effusion. 4. Emphysematous change, upper lobe predominant. 5. No definite evidence of osseous metastasis. Scattered tiny lucent foci within the thoracic vertebral bodies could conceivably represent early developing metastases.      11/30/2015 - 12/06/2015 Hospital Admission    She was admitted for severe shortness of breath and was found to have malignant effusion      12/01/2015 Imaging    As seen on yesterday's chest CT, there is metastatic disease to both lung bases with a large malignant left pleural effusion. There are several pleural-based metastases on the left and right infrahilar lymphadenopathy, incompletely visualized. 2. Left periaortic and left pelvic side wall lymphadenopathy consistent with metastatic disease. No other evidence of abdominal  pelvic metastatic disease. 3. No hydronephrosis.      12/04/2015 Procedure    Successful placement of a tunneled left pleural catheter with ultrasound and fluoroscopic guidance.       12/22/2015 Pathology Results    Pleura, peel, Left pleural peel - RARE MALIGNANT CELLS. - SEE MICROSCOPIC DESCRIPTION Microscopic Comment The specimen consists of abundant fibrin with rare minute aggregates of epithelial cells with malignant features consistent with poorly differentiated carcinoma.       12/22/2015 - 12/26/2015 Hospital Admission    She was admitted for management of malignant pleural effusion      12/22/2015 Surgery    OPERATION:  Left VATS (video-assisted thoracoscopic surgery) with drainage of loculated malignant effusion, chemical pleurodesis with doxycycline, placement of PleurX catheter.  PREOPERATIVE DIAGNOSES:  Loculated left malignant effusion, history of endometrial carcinoma, advanced  age.  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      01/04/2016 - 05/16/2016 Chemotherapy    She received carboplatin & Taxol       03/19/2016 Imaging    Interval response to therapy. 2. Decrease in volume of left pleural effusion. The index lesions within the chest have decreased in size in the interval. 3. Interval decrease in size of retroperitoneal and left pelvic side wall adenopathy. 4. Aortic atherosclerosis.       06/21/2016 Imaging    Apparent interval mixed response to therapy. Pleural disease in the chest has progressed along with progression of mediastinal lymphadenopathy. Some pulmonary nodules progressed while others have decreased in size in the interval. The index abdominal retroperitoneal left pelvic sidewall lymph nodes have decreased in the interval. 2. Persistent small left pleural effusion with posterior left base collapse/consolidation. 3.  Abdominal Aortic Atherosclerois       07/10/2016 Imaging    Left ventricle: The cavity size was normal. Wall thickness was increased in a pattern of mild LVH. Systolic function was normal. The estimated ejection fraction was in the range of 60% to 65%. Wall motion was normal; there were no regional wall motion abnormalities. Doppler parameters are consistent with abnormal left ventricular relaxation (grade 1 diastolic dysfunction).       07/11/2016 - 08/08/2016 Chemotherapy    She received Doxil only       07/25/2016 Adverse Reaction    She felt dehydrated, dizzy, had mucositis, constipated and miserable      09/02/2016 Imaging    Mild increase in mediastinal lymphadenopathy. Stable  mild bilateral hilar lymphadenopathy. Mild interval progression of bilateral pulmonary metastases. Stable small left pleural effusion. Stable mild left external iliac lymphadenopathy. No new or progressive metastatic disease within the abdomen or  pelvis      09/20/2016 -  Chemotherapy    The patient had palliative chemo with Abraxane      10/16/2016 Imaging    CT imaging: Mild progression of mediastinal lymphadenopathy in bilateral pulmonary metastases. Stable mild bilateral hilar lymphadenopathy. Stable mild left retrocrural and left external iliac lymphadenopathy. No new or progressive metastatic disease within the abdomen or pelvis. Moderate emphysema. Stable 4.0 cm ascending thoracic aortic aneurysm.       INTERVAL HISTORY: Please see below for problem oriented charting. She returns with her daughter for further follow-up. She tolerated Megace well She has no further weight loss She continues to feel very weak She appears clinically dehydrated She has intermittent chest wall discomfort but has not taken any pain medicine recently Denies recent nausea or constipation. The patient denies any recent signs or symptoms of bleeding such as spontaneous epistaxis, hematuria or hematochezia.  REVIEW OF SYSTEMS:   Constitutional: Denies fevers, chills or abnormal weight loss Eyes: Denies blurriness of vision Ears, nose, mouth, throat, and face: Denies mucositis or sore throat Respiratory: Denies cough, dyspnea or wheezes Cardiovascular: Denies palpitation, chest discomfort or lower extremity swelling Gastrointestinal:  Denies nausea, heartburn or change in bowel habits Skin: Denies abnormal skin rashes Lymphatics: Denies new lymphadenopathy or easy bruising Neurological:Denies numbness, tingling or new weaknesses Behavioral/Psych: Mood is stable, no new changes  All other systems were reviewed with the patient and are negative.  I have reviewed the past medical history, past surgical history, social history and family history with the patient and they are unchanged from previous note.  ALLERGIES:  is allergic to carboplatin and levofloxacin.  MEDICATIONS:  Current Outpatient Prescriptions  Medication Sig Dispense Refill  .  albuterol (PROVENTIL HFA;VENTOLIN HFA) 108 (90 Base) MCG/ACT inhaler Inhale 1 puff into the lungs every 6 (six) hours as needed for wheezing or shortness of breath.    . Calcium Carbonate-Vitamin D (CALCIUM 600+D) 600-400 MG-UNIT tablet Take 1 tablet by mouth daily.    . cholecalciferol (VITAMIN D) 1000 units tablet Take 1,000 Units by mouth daily.    . ferrous sulfate 325 (65 FE) MG tablet Take 325 mg by mouth daily with breakfast.     . fludrocortisone (FLORINEF) 0.1 MG tablet Take 2 tablets (0.2 mg total) by mouth daily. 90 tablet 3  . LORazepam (ATIVAN) 0.5 MG tablet Take 1 tablet (0.5 mg total) by mouth 2 (two) times daily as needed for anxiety. (Patient not taking: Reported on 10/14/2016) 30 tablet 0  . megestrol (MEGACE) 40 MG tablet Take 1 tablet (40 mg total) by mouth 2 (two) times daily. 60 tablet 1  . mirtazapine (REMERON) 15 MG tablet Take 1 tablet (15 mg total) by mouth at bedtime. (Patient not taking: Reported on 10/14/2016) 90 tablet 1  . Multiple Vitamin (MULTIVITAMIN WITH MINERALS) TABS tablet Take 1 tablet by mouth daily.    . polyethylene glycol (MIRALAX / GLYCOLAX) packet Take 17 g by mouth daily.     . predniSONE (DELTASONE) 50 MG tablet Take 1 tablet (50 mg total) by mouth daily with breakfast. 10 tablet 0  . traMADol (ULTRAM) 50 MG tablet Take 1 tablet (50 mg total) by mouth every 6 (six) hours as needed. (Patient taking differently: Take 50 mg by mouth every 6 (six) hours as needed  for moderate pain. ) 90 tablet 0  . vitamin B-12 (CYANOCOBALAMIN) 1000 MCG tablet Take 1,000 mcg by mouth daily.     No current facility-administered medications for this visit.    Facility-Administered Medications Ordered in Other Visits  Medication Dose Route Frequency Provider Last Rate Last Dose  . sodium chloride 0.9 % injection 10 mL  10 mL Intravenous PRN Livesay, Lennis P, MD   10 mL at 02/15/16 1601    PHYSICAL EXAMINATION: ECOG PERFORMANCE STATUS: 2 - Symptomatic, <50% confined to  bed  Vitals:   11/04/16 1248  BP: (!) 85/57  Pulse: (!) 106  Resp: (!) 21  Temp: 99 F (37.2 C)   Filed Weights   11/04/16 1248  Weight: 131 lb 3.2 oz (59.5 kg)    GENERAL:alert, no distress and comfortable.  She looks very thin and cachectic SKIN: skin color, texture, turgor are normal, no rashes or significant lesions EYES: normal, Conjunctiva are pink and non-injected, sclera clear OROPHARYNX:no exudate, no erythema and lips, buccal mucosa, and tongue normal  NECK: supple, thyroid normal size, non-tender, without nodularity LYMPH:  no palpable lymphadenopathy in the cervical, axillary or inguinal LUNGS: clear to auscultation and percussion with normal breathing effort HEART: regular rate & rhythm and no murmurs and no lower extremity edema ABDOMEN:abdomen soft, non-tender and normal bowel sounds Musculoskeletal:no cyanosis of digits and no clubbing  NEURO: alert & oriented x 3 with fluent speech, no focal motor/sensory deficits  LABORATORY DATA:  I have reviewed the data as listed    Component Value Date/Time   NA 138 11/04/2016 1317   K 3.7 11/04/2016 1317   CL 101 10/07/2016 1352   CO2 23 11/04/2016 1317   GLUCOSE 176 (H) 11/04/2016 1317   BUN 9.6 11/04/2016 1317   CREATININE 0.7 11/04/2016 1317   CALCIUM 9.1 11/04/2016 1317   PROT 8.0 11/04/2016 1317   ALBUMIN 1.6 (L) 11/04/2016 1317   AST 26 11/04/2016 1317   ALT 29 11/04/2016 1317   ALKPHOS 240 (H) 11/04/2016 1317   BILITOT 0.70 11/04/2016 1317   GFRNONAA >60 10/07/2016 1352   GFRAA >60 10/07/2016 1352    No results found for: SPEP, UPEP  Lab Results  Component Value Date   WBC 8.4 11/04/2016   NEUTROABS 7.1 (H) 11/04/2016   HGB 7.9 (L) 11/04/2016   HCT 25.6 (L) 11/04/2016   MCV 92.1 11/04/2016   PLT 334 11/04/2016      Chemistry      Component Value Date/Time   NA 138 11/04/2016 1317   K 3.7 11/04/2016 1317   CL 101 10/07/2016 1352   CO2 23 11/04/2016 1317   BUN 9.6 11/04/2016 1317    CREATININE 0.7 11/04/2016 1317      Component Value Date/Time   CALCIUM 9.1 11/04/2016 1317   ALKPHOS 240 (H) 11/04/2016 1317   AST 26 11/04/2016 1317   ALT 29 11/04/2016 1317   BILITOT 0.70 11/04/2016 1317       RADIOGRAPHIC STUDIES: I have personally reviewed the radiological images as listed and agreed with the findings in the report. Ct Chest W Contrast  Result Date: 10/16/2016 CLINICAL DATA:  Followup metastatic endometrial carcinoma. Increased shortness of breath. Currently undergoing chemotherapy. Previous surgery and radiation therapy. EXAM: CT CHEST, ABDOMEN, AND PELVIS WITH CONTRAST TECHNIQUE: Multidetector CT imaging of the chest, abdomen and pelvis was performed following the standard protocol during bolus administration of intravenous contrast. CONTRAST:  187mL ISOVUE-300 IOPAMIDOL (ISOVUE-300) INJECTION 61% COMPARISON:  09/02/2016 FINDINGS: CT  CHEST FINDINGS Cardiovascular: No acute findings. Stable 4.0 cm ascending thoracic aortic aneurysm. Mediastinum/Lymph Nodes: Mild increase in mediastinal lymphadenopathy since prior study. Index lesion in the lateral aortic region measures 2.7 x 4.4 cm on image 25/ 2 compared to 2.5 x 3.7 cm previously. Index adenopathy in the subcarinal region measures 2.8 cm on image 38/ 2 compared to 2.2 cm previously. Mild bilateral hilar lymphadenopathy shows no significant change. No evidence of axillary or supraclavicular lymphadenopathy. Lungs/Pleura: Moderate emphysema again demonstrated. Bilateral pulmonary metastases involving left lung greater than right show interval increase since previous study. Index nodule in posterior right lower lobe currently measures 2.7 x 2.4 cm on image 119/4 compared to 2.4 x 1.9 cm previously. Index nodule in the superior left lower lobe abutting the major fissure measures 3.7 x 2.8 cm on image 51/4 compared to 2.4 x 2.1 cm previously. Tiny left pleural effusion is decreased in size since prior exam. Musculoskeletal:  No  suspicious bone lesions identified. CT ABDOMEN AND PELVIS FINDINGS Hepatobiliary: No masses identified. Gallbladder is unremarkable. Pancreas:  No mass or inflammatory changes. Spleen:  Within normal limits in size and appearance. Adrenals/Urinary tract: Stable small bilateral renal cysts. No masses or hydronephrosis. Stomach/Bowel: No evidence of obstruction, inflammatory process, or abnormal fluid collections. Vascular/Lymphatic: Mild left retrocrural lymphadenopathy measures 2.2 x 1.7 cm and is not significantly changed since prior study. Mild left external iliac lymphadenopathy is seen measuring 16 mm on image 99/ 2 compared to 15 mm previously. No new or increased lymphadenopathy within the abdomen or pelvis. No abdominal aortic aneurysm. Aortic atherosclerosis. Reproductive: Prior hysterectomy noted. Adnexal regions are unremarkable in appearance. Other:  None. Musculoskeletal: No suspicious bone lesions identified. Moderate left hip osteoarthritis. IMPRESSION: Mild progression of mediastinal lymphadenopathy in bilateral pulmonary metastases. Stable mild bilateral hilar lymphadenopathy. Stable mild left retrocrural and left external iliac lymphadenopathy. No new or progressive metastatic disease within the abdomen or pelvis. Moderate emphysema. Stable 4.0 cm ascending thoracic aortic aneurysm. Electronically Signed   By: Earle Gell M.D.   On: 10/16/2016 15:35   Ct Abdomen Pelvis W Contrast  Result Date: 10/16/2016 CLINICAL DATA:  Followup metastatic endometrial carcinoma. Increased shortness of breath. Currently undergoing chemotherapy. Previous surgery and radiation therapy. EXAM: CT CHEST, ABDOMEN, AND PELVIS WITH CONTRAST TECHNIQUE: Multidetector CT imaging of the chest, abdomen and pelvis was performed following the standard protocol during bolus administration of intravenous contrast. CONTRAST:  156mL ISOVUE-300 IOPAMIDOL (ISOVUE-300) INJECTION 61% COMPARISON:  09/02/2016 FINDINGS: CT CHEST FINDINGS  Cardiovascular: No acute findings. Stable 4.0 cm ascending thoracic aortic aneurysm. Mediastinum/Lymph Nodes: Mild increase in mediastinal lymphadenopathy since prior study. Index lesion in the lateral aortic region measures 2.7 x 4.4 cm on image 25/ 2 compared to 2.5 x 3.7 cm previously. Index adenopathy in the subcarinal region measures 2.8 cm on image 38/ 2 compared to 2.2 cm previously. Mild bilateral hilar lymphadenopathy shows no significant change. No evidence of axillary or supraclavicular lymphadenopathy. Lungs/Pleura: Moderate emphysema again demonstrated. Bilateral pulmonary metastases involving left lung greater than right show interval increase since previous study. Index nodule in posterior right lower lobe currently measures 2.7 x 2.4 cm on image 119/4 compared to 2.4 x 1.9 cm previously. Index nodule in the superior left lower lobe abutting the major fissure measures 3.7 x 2.8 cm on image 51/4 compared to 2.4 x 2.1 cm previously. Tiny left pleural effusion is decreased in size since prior exam. Musculoskeletal:  No suspicious bone lesions identified. CT ABDOMEN AND PELVIS FINDINGS Hepatobiliary: No  masses identified. Gallbladder is unremarkable. Pancreas:  No mass or inflammatory changes. Spleen:  Within normal limits in size and appearance. Adrenals/Urinary tract: Stable small bilateral renal cysts. No masses or hydronephrosis. Stomach/Bowel: No evidence of obstruction, inflammatory process, or abnormal fluid collections. Vascular/Lymphatic: Mild left retrocrural lymphadenopathy measures 2.2 x 1.7 cm and is not significantly changed since prior study. Mild left external iliac lymphadenopathy is seen measuring 16 mm on image 99/ 2 compared to 15 mm previously. No new or increased lymphadenopathy within the abdomen or pelvis. No abdominal aortic aneurysm. Aortic atherosclerosis. Reproductive: Prior hysterectomy noted. Adnexal regions are unremarkable in appearance. Other:  None. Musculoskeletal: No  suspicious bone lesions identified. Moderate left hip osteoarthritis. IMPRESSION: Mild progression of mediastinal lymphadenopathy in bilateral pulmonary metastases. Stable mild bilateral hilar lymphadenopathy. Stable mild left retrocrural and left external iliac lymphadenopathy. No new or progressive metastatic disease within the abdomen or pelvis. Moderate emphysema. Stable 4.0 cm ascending thoracic aortic aneurysm. Electronically Signed   By: Earle Gell M.D.   On: 10/16/2016 15:35    ASSESSMENT & PLAN:  Endometrial cancer ([place]) As a long discussion with the patient and daughter She is very debilitated with unstable vital signs and it is not safe to pursue additional chemotherapy We discussed palliative care/hospice but she is not ready I recommend aggressive supportive care along with a trial of Megace Even though Megace are typically prescribed for patients with low-grade endometrial cancer, she may have some beneficial effect on cancer control along with appetite stimulant She agree with the plan of care The risks, benefits, side effects of Megace including risk of weight gain and blood clots were discussed She appears to tolerate Megace at 40 mg twice a day I recommend increasing Megace to 80 mg twice a day  Antineoplastic chemotherapy induced anemia We discussed some of the risks, benefits, and alternatives of blood transfusions. The patient is symptomatic from anemia and the hemoglobin level is critically low.  Some of the side-effects to be expected including risks of transfusion reactions, chills, infection, syndrome of volume overload and risk of hospitalization from various reasons and the patient is willing to proceed and went ahead to sign consent today. I recommend 1 unit of blood transfusion today and she agreed  Cachexia [facility]) She has severe malignant cachexia Prednisone did not seem to increase appetite She was on Remeron and that did not help either I recommend a trial of  Megace and this appeared to be helpful I recommend increasing Megace to twice a day, 80 mg  dose.  Malignant neoplasm metastatic to lung [facility]) She is currently asymptomatic.  Monitor closely  Arterial hypotension This is multifactorial, likely due to severe anemia and dehydration. She will get both blood transfusion and IV fluids  Cancer associated pain She complained of chest wall discomfort. I suspect this is due to cancer pain. She declined pain medicine. I recommend she takes tramadol as needed   Orders Placed This Encounter  Procedures  . Hold Tube, Blood Bank    Standing Status:   Future    Standing Expiration Date:   12/09/2017   All questions were answered. The patient knows to call the clinic with any problems, questions or concerns. No barriers to learning was detected. I spent 25 minutes counseling the patient face to face. The total time spent in the appointment was 40 minutes and more than 50% was on counseling and review of test results     Heath Lark, MD 11/05/2016 10:57 AM

## 2016-11-05 NOTE — Assessment & Plan Note (Signed)
She complained of chest wall discomfort. I suspect this is due to cancer pain. She declined pain medicine. I recommend she takes tramadol as needed

## 2016-11-05 NOTE — Assessment & Plan Note (Signed)
This is multifactorial, likely due to severe anemia and dehydration. She will get both blood transfusion and IV fluids

## 2016-11-07 ENCOUNTER — Ambulatory Visit (HOSPITAL_BASED_OUTPATIENT_CLINIC_OR_DEPARTMENT_OTHER): Payer: Medicare Other

## 2016-11-07 VITALS — BP 110/70 | HR 89 | Temp 98.4°F | Resp 18

## 2016-11-07 DIAGNOSIS — C541 Malignant neoplasm of endometrium: Secondary | ICD-10-CM | POA: Diagnosis not present

## 2016-11-07 DIAGNOSIS — I951 Orthostatic hypotension: Secondary | ICD-10-CM | POA: Diagnosis not present

## 2016-11-07 MED ORDER — HEPARIN SOD (PORK) LOCK FLUSH 100 UNIT/ML IV SOLN
500.0000 [IU] | Freq: Once | INTRAVENOUS | Status: AC
  Administered 2016-11-07: 500 [IU]
  Filled 2016-11-07: qty 5

## 2016-11-07 MED ORDER — SODIUM CHLORIDE 0.9% FLUSH
10.0000 mL | Freq: Once | INTRAVENOUS | Status: AC
  Administered 2016-11-07: 10 mL
  Filled 2016-11-07: qty 10

## 2016-11-07 MED ORDER — SODIUM CHLORIDE 0.9 % IV SOLN
Freq: Once | INTRAVENOUS | Status: AC
  Administered 2016-11-07: 14:00:00 via INTRAVENOUS

## 2016-11-07 NOTE — Patient Instructions (Signed)

## 2016-11-12 ENCOUNTER — Ambulatory Visit (HOSPITAL_BASED_OUTPATIENT_CLINIC_OR_DEPARTMENT_OTHER): Payer: Medicare Other

## 2016-11-12 ENCOUNTER — Other Ambulatory Visit: Payer: Self-pay | Admitting: Hematology and Oncology

## 2016-11-12 ENCOUNTER — Other Ambulatory Visit (HOSPITAL_BASED_OUTPATIENT_CLINIC_OR_DEPARTMENT_OTHER): Payer: Medicare Other

## 2016-11-12 ENCOUNTER — Ambulatory Visit: Payer: Medicare Other

## 2016-11-12 VITALS — BP 106/67 | HR 92 | Temp 98.3°F | Resp 18 | Ht 66.0 in | Wt 131.0 lb

## 2016-11-12 DIAGNOSIS — C541 Malignant neoplasm of endometrium: Secondary | ICD-10-CM

## 2016-11-12 DIAGNOSIS — D630 Anemia in neoplastic disease: Secondary | ICD-10-CM

## 2016-11-12 LAB — CBC WITH DIFFERENTIAL/PLATELET
BASO%: 0.2 % (ref 0.0–2.0)
Basophils Absolute: 0 10*3/uL (ref 0.0–0.1)
EOS%: 0.4 % (ref 0.0–7.0)
Eosinophils Absolute: 0 10*3/uL (ref 0.0–0.5)
HEMATOCRIT: 27.3 % — AB (ref 34.8–46.6)
HEMOGLOBIN: 8.4 g/dL — AB (ref 11.6–15.9)
LYMPH#: 1 10*3/uL (ref 0.9–3.3)
LYMPH%: 10.1 % — ABNORMAL LOW (ref 14.0–49.7)
MCH: 28.1 pg (ref 25.1–34.0)
MCHC: 30.8 g/dL — ABNORMAL LOW (ref 31.5–36.0)
MCV: 91.3 fL (ref 79.5–101.0)
MONO#: 1.1 10*3/uL — AB (ref 0.1–0.9)
MONO%: 11.1 % (ref 0.0–14.0)
NEUT%: 78.2 % — ABNORMAL HIGH (ref 38.4–76.8)
NEUTROS ABS: 7.7 10*3/uL — AB (ref 1.5–6.5)
Platelets: 313 10*3/uL (ref 145–400)
RBC: 2.99 10*6/uL — ABNORMAL LOW (ref 3.70–5.45)
RDW: 21.1 % — ABNORMAL HIGH (ref 11.2–14.5)
WBC: 9.8 10*3/uL (ref 3.9–10.3)
nRBC: 1 % — ABNORMAL HIGH (ref 0–0)

## 2016-11-12 LAB — COMPREHENSIVE METABOLIC PANEL
ALT: 19 U/L (ref 0–55)
ANION GAP: 13 meq/L — AB (ref 3–11)
AST: 28 U/L (ref 5–34)
Albumin: 1.5 g/dL — ABNORMAL LOW (ref 3.5–5.0)
Alkaline Phosphatase: 239 U/L — ABNORMAL HIGH (ref 40–150)
BUN: 14.5 mg/dL (ref 7.0–26.0)
CHLORIDE: 107 meq/L (ref 98–109)
CO2: 22 meq/L (ref 22–29)
Calcium: 9.5 mg/dL (ref 8.4–10.4)
Creatinine: 0.7 mg/dL (ref 0.6–1.1)
Glucose: 185 mg/dl — ABNORMAL HIGH (ref 70–140)
Potassium: 3.7 mEq/L (ref 3.5–5.1)
Sodium: 142 mEq/L (ref 136–145)
Total Bilirubin: 0.49 mg/dL (ref 0.20–1.20)
Total Protein: 7.9 g/dL (ref 6.4–8.3)

## 2016-11-12 MED ORDER — HEPARIN SOD (PORK) LOCK FLUSH 100 UNIT/ML IV SOLN
500.0000 [IU] | Freq: Once | INTRAVENOUS | Status: AC | PRN
  Administered 2016-11-12: 500 [IU]
  Filled 2016-11-12: qty 5

## 2016-11-12 MED ORDER — SODIUM CHLORIDE 0.9% FLUSH
10.0000 mL | INTRAVENOUS | Status: DC | PRN
  Administered 2016-11-12: 10 mL
  Filled 2016-11-12: qty 10

## 2016-11-12 MED ORDER — SODIUM CHLORIDE 0.9 % IV SOLN
Freq: Once | INTRAVENOUS | Status: AC
Start: 2016-11-12 — End: 2016-11-12
  Administered 2016-11-12: 15:00:00 via INTRAVENOUS

## 2016-11-12 MED ORDER — MEGESTROL ACETATE 40 MG PO TABS: 80.0000 mg | ORAL_TABLET | Freq: Two times a day (BID) | ORAL | 1 refills | Status: DC

## 2016-11-12 NOTE — Progress Notes (Signed)
Lab results given to Dr. Alvy Bimler.  No Blood transfusion needed at this time.

## 2016-11-12 NOTE — Patient Instructions (Signed)
Dehydration, Adult Dehydration is a condition in which there is not enough fluid or water in the body. This happens when you lose more fluids than you take in. Important organs, such as the kidneys, brain, and heart, cannot function without a proper amount of fluids. Any loss of fluids from the body can lead to dehydration. Dehydration can range from mild to severe. This condition should be treated right away to prevent it from becoming severe. What are the causes? This condition may be caused by:  Vomiting.  Diarrhea.  Excessive sweating, such as from heat exposure or exercise.  Not drinking enough fluid, especially:  When ill.  While doing activity that requires a lot of energy.  Excessive urination.  Fever.  Infection.  Certain medicines, such as medicines that cause the body to lose excess fluid (diuretics).  Inability to access safe drinking water.  Reduced physical ability to get adequate water and food. What increases the risk? This condition is more likely to develop in people:  Who have a poorly controlled long-term (chronic) illness, such as diabetes, heart disease, or kidney disease.  Who are age 65 or older.  Who are disabled.  Who live in a place with high altitude.  Who play endurance sports. What are the signs or symptoms? Symptoms of mild dehydration may include:   Thirst.  Dry lips.  Slightly dry mouth.  Dry, warm skin.  Dizziness. Symptoms of moderate dehydration may include:   Very dry mouth.  Muscle cramps.  Dark urine. Urine may be the color of tea.  Decreased urine production.  Decreased tear production.  Heartbeat that is irregular or faster than normal (palpitations).  Headache.  Light-headedness, especially when you stand up from a sitting position.  Fainting (syncope). Symptoms of severe dehydration may include:   Changes in skin, such as:  Cold and clammy skin.  Blotchy (mottled) or pale skin.  Skin that does  not quickly return to normal after being lightly pinched and released (poor skin turgor).  Changes in body fluids, such as:  Extreme thirst.  No tear production.  Inability to sweat when body temperature is high, such as in hot weather.  Very little urine production.  Changes in vital signs, such as:  Weak pulse.  Pulse that is more than 100 beats a minute when sitting still.  Rapid breathing.  Low blood pressure.  Other changes, such as:  Sunken eyes.  Cold hands and feet.  Confusion.  Lack of energy (lethargy).  Difficulty waking up from sleep.  Short-term weight loss.  Unconsciousness. How is this diagnosed? This condition is diagnosed based on your symptoms and a physical exam. Blood and urine tests may be done to help confirm the diagnosis. How is this treated? Treatment for this condition depends on the severity. Mild or moderate dehydration can often be treated at home. Treatment should be started right away. Do not wait until dehydration becomes severe. Severe dehydration is an emergency and it needs to be treated in a hospital. Treatment for mild dehydration may include:   Drinking more fluids.  Replacing salts and minerals in your blood (electrolytes) that you may have lost. Treatment for moderate dehydration may include:   Drinking an oral rehydration solution (ORS). This is a drink that helps you replace fluids and electrolytes (rehydrate). It can be found at pharmacies and retail stores. Treatment for severe dehydration may include:   Receiving fluids through an IV tube.  Receiving an electrolyte solution through a feeding tube that is   passed through your nose and into your stomach (nasogastric tube, or NG tube).  Correcting any abnormalities in electrolytes.  Treating the underlying cause of dehydration. Follow these instructions at home:  If directed by your health care provider, drink an ORS:  Make an ORS by following instructions on the  package.  Start by drinking small amounts, about  cup (120 mL) every 5-10 minutes.  Slowly increase how much you drink until you have taken the amount recommended by your health care provider.  Drink enough clear fluid to keep your urine clear or pale yellow. If you were told to drink an ORS, finish the ORS first, then start slowly drinking other clear fluids. Drink fluids such as:  Water. Do not drink only water. Doing that can lead to having too little salt (sodium) in the body (hyponatremia).  Ice chips.  Fruit juice that you have added water to (diluted fruit juice).  Low-calorie sports drinks.  Avoid:  Alcohol.  Drinks that contain a lot of sugar. These include high-calorie sports drinks, fruit juice that is not diluted, and soda.  Caffeine.  Foods that are greasy or contain a lot of fat or sugar.  Take over-the-counter and prescription medicines only as told by your health care provider.  Do not take sodium tablets. This can lead to having too much sodium in the body (hypernatremia).  Eat foods that contain a healthy balance of electrolytes, such as bananas, oranges, potatoes, tomatoes, and spinach.  Keep all follow-up visits as told by your health care provider. This is important. Contact a health care provider if:  You have abdominal pain that:  Gets worse.  Stays in one area (localizes).  You have a rash.  You have a stiff neck.  You are more irritable than usual.  You are sleepier or more difficult to wake up than usual.  You feel weak or dizzy.  You feel very thirsty.  You have urinated only a small amount of very dark urine over 6-8 hours. Get help right away if:  You have symptoms of severe dehydration.  You cannot drink fluids without vomiting.  Your symptoms get worse with treatment.  You have a fever.  You have a severe headache.  You have vomiting or diarrhea that:  Gets worse.  Does not go away.  You have blood or green matter  (bile) in your vomit.  You have blood in your stool. This may cause stool to look black and tarry.  You have not urinated in 6-8 hours.  You faint.  Your heart rate while sitting still is over 100 beats a minute.  You have trouble breathing. This information is not intended to replace advice given to you by your health care provider. Make sure you discuss any questions you have with your health care provider. Document Released: 06/03/2005 Document Revised: 12/29/2015 Document Reviewed: 07/28/2015 Elsevier Interactive Patient Education  2017 [employer] Inc.  

## 2016-11-12 NOTE — Patient Instructions (Signed)

## 2016-11-13 ENCOUNTER — Inpatient Hospital Stay (HOSPITAL_COMMUNITY)
Admission: EM | Admit: 2016-11-13 | Discharge: 2016-11-15 | DRG: 054 | Disposition: A | Discharge status: Hospice | Payer: Medicare Other | Attending: Internal Medicine | Admitting: Internal Medicine

## 2016-11-13 ENCOUNTER — Other Ambulatory Visit: Payer: Self-pay

## 2016-11-13 ENCOUNTER — Telehealth: Payer: Self-pay | Admitting: *Deleted

## 2016-11-13 ENCOUNTER — Emergency Department (HOSPITAL_COMMUNITY): Payer: Medicare Other

## 2016-11-13 ENCOUNTER — Encounter (HOSPITAL_COMMUNITY): Payer: Self-pay | Admitting: Emergency Medicine

## 2016-11-13 DIAGNOSIS — R4182 Altered mental status, unspecified: Secondary | ICD-10-CM | POA: Diagnosis not present

## 2016-11-13 DIAGNOSIS — Z6821 Body mass index (BMI) 21.0-21.9, adult: Secondary | ICD-10-CM

## 2016-11-13 DIAGNOSIS — C7800 Secondary malignant neoplasm of unspecified lung: Secondary | ICD-10-CM | POA: Diagnosis present

## 2016-11-13 DIAGNOSIS — R569 Unspecified convulsions: Secondary | ICD-10-CM | POA: Diagnosis not present

## 2016-11-13 DIAGNOSIS — C7931 Secondary malignant neoplasm of brain: Principal | ICD-10-CM

## 2016-11-13 DIAGNOSIS — D72829 Elevated white blood cell count, unspecified: Secondary | ICD-10-CM

## 2016-11-13 DIAGNOSIS — Z66 Do not resuscitate: Secondary | ICD-10-CM | POA: Diagnosis present

## 2016-11-13 DIAGNOSIS — Z515 Encounter for palliative care: Secondary | ICD-10-CM | POA: Diagnosis present

## 2016-11-13 DIAGNOSIS — M1990 Unspecified osteoarthritis, unspecified site: Secondary | ICD-10-CM | POA: Diagnosis present

## 2016-11-13 DIAGNOSIS — Z9221 Personal history of antineoplastic chemotherapy: Secondary | ICD-10-CM

## 2016-11-13 DIAGNOSIS — G919 Hydrocephalus, unspecified: Secondary | ICD-10-CM

## 2016-11-13 DIAGNOSIS — C541 Malignant neoplasm of endometrium: Secondary | ICD-10-CM | POA: Diagnosis not present

## 2016-11-13 DIAGNOSIS — R197 Diarrhea, unspecified: Secondary | ICD-10-CM | POA: Diagnosis present

## 2016-11-13 DIAGNOSIS — G936 Cerebral edema: Secondary | ICD-10-CM

## 2016-11-13 DIAGNOSIS — J449 Chronic obstructive pulmonary disease, unspecified: Secondary | ICD-10-CM | POA: Diagnosis present

## 2016-11-13 DIAGNOSIS — Z8542 Personal history of malignant neoplasm of other parts of uterus: Secondary | ICD-10-CM

## 2016-11-13 DIAGNOSIS — K5900 Constipation, unspecified: Secondary | ICD-10-CM | POA: Diagnosis present

## 2016-11-13 DIAGNOSIS — R531 Weakness: Secondary | ICD-10-CM | POA: Diagnosis present

## 2016-11-13 DIAGNOSIS — R64 Cachexia: Secondary | ICD-10-CM | POA: Diagnosis present

## 2016-11-13 DIAGNOSIS — G893 Neoplasm related pain (acute) (chronic): Secondary | ICD-10-CM | POA: Diagnosis present

## 2016-11-13 DIAGNOSIS — R159 Full incontinence of feces: Secondary | ICD-10-CM | POA: Diagnosis present

## 2016-11-13 DIAGNOSIS — Z87891 Personal history of nicotine dependence: Secondary | ICD-10-CM

## 2016-11-13 DIAGNOSIS — C799 Secondary malignant neoplasm of unspecified site: Secondary | ICD-10-CM

## 2016-11-13 DIAGNOSIS — Z7189 Other specified counseling: Secondary | ICD-10-CM

## 2016-11-13 DIAGNOSIS — Z90710 Acquired absence of both cervix and uterus: Secondary | ICD-10-CM

## 2016-11-13 DIAGNOSIS — Z883 Allergy status to other anti-infective agents status: Secondary | ICD-10-CM

## 2016-11-13 DIAGNOSIS — C787 Secondary malignant neoplasm of liver and intrahepatic bile duct: Secondary | ICD-10-CM | POA: Diagnosis present

## 2016-11-13 DIAGNOSIS — Z923 Personal history of irradiation: Secondary | ICD-10-CM

## 2016-11-13 DIAGNOSIS — Z888 Allergy status to other drugs, medicaments and biological substances status: Secondary | ICD-10-CM

## 2016-11-13 DIAGNOSIS — D638 Anemia in other chronic diseases classified elsewhere: Secondary | ICD-10-CM | POA: Diagnosis present

## 2016-11-13 DIAGNOSIS — G40409 Other generalized epilepsy and epileptic syndromes, not intractable, without status epilepticus: Secondary | ICD-10-CM | POA: Diagnosis present

## 2016-11-13 LAB — URINALYSIS, ROUTINE W REFLEX MICROSCOPIC
BACTERIA UA: NONE SEEN
BILIRUBIN URINE: NEGATIVE
Glucose, UA: NEGATIVE mg/dL
HGB URINE DIPSTICK: NEGATIVE
Ketones, ur: NEGATIVE mg/dL
LEUKOCYTES UA: NEGATIVE
NITRITE: NEGATIVE
PROTEIN: 100 mg/dL — AB
Specific Gravity, Urine: 1.025 (ref 1.005–1.030)
Squamous Epithelial / HPF: NONE SEEN
pH: 6 (ref 5.0–8.0)

## 2016-11-13 LAB — CBC
HEMATOCRIT: 28.9 % — AB (ref 36.0–46.0)
Hemoglobin: 9.2 g/dL — ABNORMAL LOW (ref 12.0–15.0)
MCH: 28.8 pg (ref 26.0–34.0)
MCHC: 31.8 g/dL (ref 30.0–36.0)
MCV: 90.3 fL (ref 78.0–100.0)
Platelets: 327 10*3/uL (ref 150–400)
RBC: 3.2 MIL/uL — AB (ref 3.87–5.11)
RDW: 21 % — ABNORMAL HIGH (ref 11.5–15.5)
WBC: 15.6 10*3/uL — AB (ref 4.0–10.5)

## 2016-11-13 LAB — COMPREHENSIVE METABOLIC PANEL
ALT: 20 U/L (ref 14–54)
AST: 28 U/L (ref 15–41)
Albumin: 2 g/dL — ABNORMAL LOW (ref 3.5–5.0)
Alkaline Phosphatase: 211 U/L — ABNORMAL HIGH (ref 38–126)
Anion gap: 11 (ref 5–15)
BUN: 14 mg/dL (ref 6–20)
CHLORIDE: 109 mmol/L (ref 101–111)
CO2: 23 mmol/L (ref 22–32)
Calcium: 9.2 mg/dL (ref 8.9–10.3)
Creatinine, Ser: 0.59 mg/dL (ref 0.44–1.00)
Glucose, Bld: 126 mg/dL — ABNORMAL HIGH (ref 65–99)
POTASSIUM: 4 mmol/L (ref 3.5–5.1)
Sodium: 143 mmol/L (ref 135–145)
Total Bilirubin: 0.5 mg/dL (ref 0.3–1.2)
Total Protein: 8.6 g/dL — ABNORMAL HIGH (ref 6.5–8.1)

## 2016-11-13 LAB — CBG MONITORING, ED: Glucose-Capillary: 127 mg/dL — ABNORMAL HIGH (ref 65–99)

## 2016-11-13 MED ORDER — POLYETHYLENE GLYCOL 3350 17 G PO PACK: 17.0000 g | PACK | Freq: Every day | ORAL | Status: DC | PRN

## 2016-11-13 MED ORDER — SODIUM CHLORIDE 0.9 % IV SOLN
1000.0000 mg | Freq: Once | INTRAVENOUS | Status: AC
  Administered 2016-11-13: 1000 mg via INTRAVENOUS
  Filled 2016-11-13: qty 10

## 2016-11-13 MED ORDER — ONDANSETRON HCL 4 MG PO TABS: 4.0000 mg | ORAL_TABLET | Freq: Four times a day (QID) | ORAL | Status: DC | PRN

## 2016-11-13 MED ORDER — LORAZEPAM 2 MG/ML PO CONC
0.5000 mg | ORAL | Status: DC | PRN
  Administered 2016-11-13: 0.5 mg via ORAL
  Filled 2016-11-13: qty 0.25

## 2016-11-13 MED ORDER — DEXAMETHASONE 1 MG/ML PO CONC
4.0000 mg | Freq: Every day | ORAL | Status: DC
  Administered 2016-11-14 – 2016-11-15 (×2): 4 mg via ORAL
  Filled 2016-11-13 (×2): qty 4

## 2016-11-13 MED ORDER — ACETAMINOPHEN 650 MG RE SUPP: 650.0000 mg | Freq: Four times a day (QID) | RECTAL | Status: DC | PRN

## 2016-11-13 MED ORDER — ONDANSETRON HCL 4 MG/2ML IJ SOLN: 4.0000 mg | Freq: Four times a day (QID) | INTRAMUSCULAR | Status: DC | PRN

## 2016-11-13 MED ORDER — ACETAMINOPHEN 325 MG PO TABS: 650.0000 mg | ORAL_TABLET | Freq: Four times a day (QID) | ORAL | Status: DC | PRN

## 2016-11-13 MED ORDER — SODIUM CHLORIDE 0.9 % IV SOLN
INTRAVENOUS | Status: AC
  Administered 2016-11-13: 23:00:00 via INTRAVENOUS

## 2016-11-13 MED ORDER — DEXAMETHASONE SODIUM PHOSPHATE 10 MG/ML IJ SOLN
10.0000 mg | Freq: Once | INTRAMUSCULAR | Status: AC
  Administered 2016-11-13: 10 mg via INTRAVENOUS
  Filled 2016-11-13: qty 1

## 2016-11-13 MED ORDER — LEVETIRACETAM 100 MG/ML PO SOLN
500.0000 mg | Freq: Two times a day (BID) | ORAL | Status: DC
  Administered 2016-11-13 – 2016-11-15 (×4): 500 mg via ORAL
  Filled 2016-11-13 (×4): qty 5

## 2016-11-13 MED ORDER — MORPHINE SULFATE (CONCENTRATE) 10 MG/0.5ML PO SOLN
5.0000 mg | ORAL | Status: DC | PRN
  Administered 2016-11-13 – 2016-11-15 (×5): 5 mg via ORAL
  Filled 2016-11-13 (×7): qty 0.5

## 2016-11-13 MED ORDER — LORAZEPAM 2 MG/ML IJ SOLN: 2.0000 mg | INTRAMUSCULAR | Status: DC | PRN

## 2016-11-13 NOTE — ED Triage Notes (Signed)
Pt BIB daughter and daughter reports worsening weakness and mental status changes. Pt became increasingly confused throughout the night, incontinent of stool and recent anorexia. Pt denies pain. Alert and oriented to self and place. Pt seen by cancer center yesterday for fluid replacement.Pt with port.

## 2016-11-13 NOTE — ED Notes (Signed)
Attempted to call report.  RN for room on 5E has not come in yet.  Will continue to monitor until pt can be transferred.

## 2016-11-13 NOTE — H&P (Addendum)
History and Physical    Sheena Caldwell YOV:[id number] DOB: [birth year] DOA: 11/13/2016  Referring MD/NP/PA: Alfonzo Beers PCP: Jacquelyne Balint, MD  Outpatient Specialists:  Heath Lark Patient coming from: home  Chief Complaint: seizures  HPI: Sheena Caldwell is a 66 y.o. 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 During her last visit with her oncologist a few weeks ago, the patient's oncologist recommended palliative care and hospice care however the patient was not ready at that time. Since that visit, she has had progressive weakness and for the last 3-4 days has been unable to get out of bed. She is also become more confused.  She is complaining about a headache for the last 2-3 days, worse with movement.  Last night, she was given a dose of ultram for her headache.  Overnight, she was agitated and she had an episode of stool incontinence. The family witnessed full body jerking today and brought her to the ER for evaluation.  She has not had any fevers, chills, sinus congestion. She has been coughingmore. The patient denies pains other than headache for the last 2 days. She had one loose stool overnight but has not had watery diarrhea since. She has not been on any antibiotics in the last few months. She takes MiraLAX daily to prevent constipation in her stools have been softer than before because of the boost that she has been drinking.    ED Course:  Vital signs were notable for mildly elevated heart rate in the 90s to low 100s. Her white blood cell count had gone up to 15.6 and was 9.8 on 5/29. She takes Florinef and Megace. Her hemoglobin and BMP were at her baseline.  Her chest x-ray demonstrated diffuse intrathoracic metastatic disease but no acute superimposed infection.  Her head CT demonstrated multiple metastatic  lesions with surrounding edema and mass effect with partial effacement of the fourth ventricle. There was lateral and third ventricular enlargement concerning for mild hydrocephalus but no downward herniation.  The patient was given Decadron 10 mg IV once and loaded with Keppra 1000 mg once. She had 1 recurrent seizure in the emergency department as the nurse was trying to obtain an in and out catheterization.    Review of Systems:  Limited by patient's confusion. Family assisted.  General:  Denies fevers, chills.  Has been losing weight.  HEENT:  Denies changes to hearing and vision, rhinorrhea, sinus congestion, sore throat.  Headache for the last 2-3 days.  CV:  Denies chest pain and palpitations, lower extremity edema.  PULM:  positive cough, denies SOB GI:  Denies nausea, vomiting.  Has been constipated but had diarrhea last night GU:  Denies dysuria, frequency, urgency ENDO:  Denies polyuria, polydipsia.   HEME:  Denies hematemesis, blood in stools, melena, abnormal bruising or bleeding.  LYMPH:  Denies lymphadenopathy.   MSK:  Positive arthralgias, myalgias of right chest and upper arms DERM:  Denies skin rash or ulcer.   NEURO:  Denies focal numbness but has diffuse weakness and worsening confusion  PSYCH:  Denies anxiety and depression.     Past Medical History:  Diagnosis Date  . Anemia   . Arthritis   . Blood transfusion without reported diagnosis    2015, 2016  . Constipation   . COPD (chronic obstructive pulmonary disease) ([place])   . Radiation 01/03/15, 01/11/15, 01/13/15, 01/24/15, 01/26/15  proximal vagina 30 gray  . Uterine cancer [facility])    finished chemo May 2016, to start radiation July 2016    Past Surgical History:  Procedure Laterality Date  . CESAREAN SECTION  1991  . CHEST TUBE INSERTION Left 12/22/2015   Procedure: INSERTION PLEURAL DRAINAGE CATHETER;  Surgeon: Ivin Poot, MD;  Location: [place];  Service: Thoracic;  Laterality: Left;  . COLONOSCOPY    . INCISE  AND DRAIN ABCESS  1970   scalp  . PLEURADESIS Left 12/22/2015   Procedure: PLEURADESIS;  Surgeon: Ivin Poot, MD;  Location: [place];  Service: Thoracic;  Laterality: Left;  . PLEURAL EFFUSION DRAINAGE Left 12/22/2015   Procedure: DRAINAGE OF PLEURAL EFFUSION;  Surgeon: Ivin Poot, MD;  Location: [place];  Service: Thoracic;  Laterality: Left;  . PORTACATH PLACEMENT  04/26/2014   rt. power port with tip in SVC  . TOTAL ABDOMINAL HYSTERECTOMY W/ BILATERAL SALPINGOOPHORECTOMY  04/06/14   TAH, BSO, omentectomy, pelvic and right common iliac node evaluation  . VIDEO ASSISTED THORACOSCOPY Left 12/22/2015   Procedure: VIDEO ASSISTED THORACOSCOPY;  Surgeon: Ivin Poot, MD;  Location: [place];  Service: Thoracic;  Laterality: Left;     reports that she quit smoking about 3 years ago. Her smoking use included Cigarettes. She has a 30.00 pack-year smoking history. She has never used smokeless tobacco. She reports that she drinks about 1.2 - 1.8 oz of alcohol per week . She reports that she uses drugs, including Marijuana.  Allergies  Allergen Reactions  . Carboplatin Other (See Comments)    Skin test reaction on 05/16/16.    . Levofloxacin Other (See Comments)    Pt states that this medication just made her feel horrible.      Family History  Problem Relation Age of Onset  . Hypertension Mother   . Cancer Father   . Colon cancer Neg Hx   . Esophageal cancer Neg Hx   . Rectal cancer Neg Hx   . Stomach cancer Neg Hx     Prior to Admission medications   Medication Sig Start Date End Date Taking? Authorizing Provider  LORazepam (ATIVAN) 0.5 MG tablet Take 1 tablet (0.5 mg total) by mouth 2 (two) times daily as needed for anxiety. 09/11/16  Yes Heath Lark, MD    Physical Exam: Vitals:   11/13/16 1432 11/13/16 1630 11/13/16 1700 11/13/16 1730  BP: 118/84 116/82 118/86 120/88  Pulse: 98 (!) 101 (!) 101 100  Resp: (!) 34 (!) 27 17 (!) 24  Temp:      TempSrc:      SpO2: 93% 96% 96% 95%     Constitutional: NAD, calm, comfortable Eyes: PERRL, lids and conjunctivae normal ENMT: Mucous membranes are moist. Posterior pharynx clear of any exudate or lesions.Normal dentition.  Neck: normal, supple, no masses, no thyromegaly Respiratory:  Diminished over the right chest compared to left, no wheezing, no crackles. Normal respiratory effort. No accessory muscle use.  Cardiovascular:  Mild tachycardia, no murmurs / rubs / gallops. No extremity edema. 2+ pedal pulses. No carotid bruits.  Abdomen: no tenderness, no masses palpated. No hepatosplenomegaly. Bowel sounds positive.  Musculoskeletal: no clubbing / cyanosis. No joint deformity upper and lower extremities. Good ROM, no contractures. Normal muscle tone.  Skin: no rashes, lesions, ulcers. No induration Neurologic: no facial droop, Sensation intact. Strength 4/5 in all 4.  Psychiatric: Alert and oriented to person and place and that she came to the ER for "shaking," but  slow to answer questions and family assisted with most of history.    Labs on Admission: I have personally reviewed following labs and imaging studies  CBC:  Recent Labs Lab 11/12/16 1400 11/13/16 1434  WBC 9.8 15.6*  NEUTROABS 7.7*  --   HGB 8.4* 9.2*  HCT 27.3* 28.9*  MCV 91.3 90.3  PLT 313 627   Basic Metabolic Panel:  Recent Labs Lab 11/12/16 1400 11/13/16 1434  NA 142 143  K 3.7 4.0  CL  --  109  CO2 22 23  GLUCOSE 185* 126*  BUN 14.5 14  CREATININE 0.7 0.59  CALCIUM 9.5 9.2   GFR: Estimated Creatinine Clearance: 67.4 mL/min (by C-G formula based on SCr of 0.59 mg/dL). Liver Function Tests:  Recent Labs Lab 11/12/16 1400 11/13/16 1434  AST 28 28  ALT 19 20  ALKPHOS 239* 211*  BILITOT 0.49 0.5  PROT 7.9 8.6*  ALBUMIN 1.5* 2.0*   No results for input(s): LIPASE, AMYLASE in the last 168 hours. No results for input(s): AMMONIA in the last 168 hours. Coagulation Profile: No results for input(s): INR, PROTIME in the last 168  hours. Cardiac Enzymes: No results for input(s): CKTOTAL, CKMB, CKMBINDEX, TROPONINI in the last 168 hours. BNP (last 3 results) No results for input(s): PROBNP in the last 8760 hours. HbA1C: No results for input(s): HGBA1C in the last 72 hours. CBG:  Recent Labs Lab 11/13/16 1443  GLUCAP 127*   Lipid Profile: No results for input(s): CHOL, HDL, LDLCALC, TRIG, CHOLHDL, LDLDIRECT in the last 72 hours. Thyroid Function Tests: No results for input(s): TSH, T4TOTAL, FREET4, T3FREE, THYROIDAB in the last 72 hours. Anemia Panel: No results for input(s): VITAMINB12, FOLATE, FERRITIN, TIBC, IRON, RETICCTPCT in the last 72 hours. Urine analysis:    Component Value Date/Time   COLORURINE AMBER (A) 11/13/2016 1439   APPEARANCEUR HAZY (A) 11/13/2016 1439   LABSPEC 1.025 11/13/2016 1439   LABSPEC 1.010 09/07/2015 0918   PHURINE 6.0 11/13/2016 1439   GLUCOSEU NEGATIVE 11/13/2016 1439   GLUCOSEU Negative 09/07/2015 0918   HGBUR NEGATIVE 11/13/2016 1439   BILIRUBINUR NEGATIVE 11/13/2016 1439   BILIRUBINUR Negative 09/07/2015 0918   KETONESUR NEGATIVE 11/13/2016 1439   PROTEINUR 100 (A) 11/13/2016 1439   UROBILINOGEN 0.2 09/07/2015 0918   NITRITE NEGATIVE 11/13/2016 1439   LEUKOCYTESUR NEGATIVE 11/13/2016 1439   LEUKOCYTESUR Negative 09/07/2015 0918   Sepsis Labs: @LABRCNTIP (procalcitonin:4,lacticidven:4) )No results found for this or any previous visit (from the past 240 hour(s)).   Radiological Exams on Admission: Dg Chest 2 View  Result Date: 11/13/2016 CLINICAL DATA:  Altered mental status.  Seizure. EXAM: CHEST  2 VIEW COMPARISON:  Chest CT 10/16/2016 FINDINGS: Left greater than right pulmonary metastases and probable left pleural based metastases. Cardiomegaly and aortic tortuosity partially obscured by tumor. No superimposed edema or air bronchogram. Right IJ porta catheter with tip at the distal SVC. IMPRESSION: Diffuse intrathoracic metastatic disease. No acute superimposed  finding. Electronically Signed   By: Monte Fantasia M.D.   On: 11/13/2016 16:12   Ct Head Wo Contrast  Result Date: 11/13/2016 CLINICAL DATA:  66 y/o F; altered mental status and history of cancer. EXAM: CT HEAD WITHOUT CONTRAST TECHNIQUE: Contiguous axial images were obtained from the base of the skull through the vertex without intravenous contrast. COMPARISON:  None. FINDINGS: Brain: Multiple intracranial foci of mass effect with surrounding vasogenic edema within the frontal lobes bilaterally, left greater than right cerebellum, and right parietal region compatible with multiple intracranial metastasis.  Mass effect in the cerebellum partially effaces the fourth ventricle and there is enlargement of lateral and third ventricles disproportional to volume loss compatible with hydrocephalus. No downward herniation. No acute hemorrhage or infarct identified. Vascular: No hyperdense vessel or unexpected calcification. Skull: Midline superior frontal scalp defect. No displaced calvarial fracture. Sinuses/Orbits: No acute finding. Other: None. IMPRESSION: 1. Multiple supratentorial and cerebellar foci of mass of effect with surrounding edema compatible with intracranial metastatic disease. 2. Mass effect with partial effacement of fourth ventricle. 3. Lateral and third ventricular enlargement disproportional to volume loss likely representing mild hydrocephalus. No downward herniation. 4. No acute infarct or hemorrhage identified. These results were called by telephone at the time of interpretation on 11/13/2016 at 4:04 pm to Dr. Alfonzo Beers , who verbally acknowledged these results. Electronically Signed   By: Kristine Garbe M.D.   On: 11/13/2016 16:05    EKG: Independently reviewed.  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 Prognosis is poor and the family including the daughter who is the next of kin understand that she is very ill and would like her to be as comfortable as possible.   -  Start dexamethasone 4 mg daily (oral solutions preferred by family) -  Start Keppra 500 mg twice a day -  IV Ativan when necessary seizure -  D/c ultram  Metastatic uterine cancer to lung, liver, and brain with worsening pain -  Case management consulted for home hospice choice -  The patient has several family members who can assist with care at home -  Start subungual morphine 5 mg every hour when necessary pain, shortness of breath -  Foley catheter for comfort  Leukocytosis, no evidence of pneumonia on chest x-ray and urinalysis was negative. This may be reactive from her seizures or from worsening malignancy. I suspect that her leukocytosis will be below [facility] tomorrow after she has received high-dose steroids today. Will not recheck.  One episode of loose stool overnight, none since. Low suspicion for C. Difficile.   -  Monitor for additional loose stools -  Hold MiraLAX  Patient continues to have anemia of chronic disease, but no further labs or work up at this time due to emphasis on comfort.  DVT prophylaxis: None, comfort measures only  Code Status: DO NOT RESUSCITATE  Family Communication: Patient, her daughter who is next of kin, and her 46 who worked as a Neurosurgeon Disposition Plan: likely to home with hospice care once arranged  Consults called: none  Admission status: observation but likely inpatient if unable to discharge home on 5/31 due to equipment or other hospice needs.    Janece Canterbury MD [facility] Pager [phone]  If 7PM-7AM, please contact night-coverage [email or web address] Password [facility]  11/13/2016, 6:32 PM

## 2016-11-13 NOTE — ED Notes (Signed)
Seizure pads placed on bed railings.

## 2016-11-13 NOTE — Telephone Encounter (Signed)
Received call from daughter concerned about her mom.  She states that wanted to check with Dr Alvy Bimler about labs from yest.  She states her mother has some confusion which is normal at times but she was incontinent of stool this am & didn't realize this & hasn't done this before.  She was confused last night & didn't sleep much but her aunt reports that pt is weaker but alert this am.  Call back # is [phone].  Message routed to Dr Marlinda Mike RN

## 2016-11-13 NOTE — Telephone Encounter (Signed)
Informed of message below. Will take her to ED

## 2016-11-13 NOTE — Telephone Encounter (Signed)
The confusion could be due to relative hypercalcemia. Labs are not much changed compared to previous labs Brain mets/seizures cannot be ruled out. I would recommend her to take her mother to the ER for expedited work-up

## 2016-11-13 NOTE — ED Notes (Signed)
Pt's CBG=127 

## 2016-11-13 NOTE — ED Provider Notes (Signed)
[facility] DEPT Provider Note   CSN: [id number] Arrival date & time: 11/13/16  1354     History   Chief Complaint Chief Complaint  Patient presents with  . Altered Mental Status  . Seizures    HPI Sheena Caldwell is a 66 y.o. female.  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  Per family last night she was confused, c/o headache and was incontinent of stool which is not her usual.  She is followed by [facility]- 3 weeks ago her chemotherapy was stopped per chart review- hospice/palliative care was offered at that time.  Daughter states patient is DNR and that they wish for her to be comfortable.  They do not wish to have CPR or for her to be intubated.    Past Medical History:  Diagnosis Date  . Anemia   . Arthritis   . Blood transfusion without reported diagnosis    2015, 2016  . Constipation   . COPD (chronic obstructive pulmonary disease) ([place])   . Radiation 01/03/15, 01/11/15, 01/13/15, 01/24/15, 01/26/15   proximal vagina 30 gray  . Uterine cancer [facility])    finished chemo May 2016, to start radiation July 2016    Patient Active Problem List   Diagnosis Date Noted  . Seizure ([place]) 11/13/2016  . Brain metastases ([place]) 11/13/2016  . Brain edema ([place]) 11/13/2016  . Hydrocephalus 11/13/2016  . Leukocytosis 11/13/2016  . Hypercalcemia of malignancy 10/21/2016  . Elevated liver enzymes 10/15/2016  . Anxiety about health 09/20/2016  . Goals of care, counseling/discussion 09/11/2016  . Mucositis due to antineoplastic therapy 07/26/2016  . Malignant neoplasm metastatic to lung ([place]) 07/07/2016  . Encounter for antineoplastic chemotherapy 06/04/2016  . DNR (do not resuscitate) 01/20/2016  . Cancer associated pain 01/06/2016  . Pleural effusion, malignant 12/22/2015  .  International Federation of Gynecology and Obstetrics (FIGO) stage IVB malignant neoplasm of endometrium ([place]) 12/17/2015  . Endometrial cancer, FIGO stage IVB ([place]) 12/17/2015  . 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 Hypoxia   . Cachexia ([place])   . Moderate malnutrition ([place])   . Port catheter in place 09/10/2015  . Iron deficiency anemia due to chronic blood loss 09/10/2015  . Antineoplastic chemotherapy induced anemia 10/08/2014  . Arterial hypotension 10/08/2014  . Elevated blood protein 10/08/2014  . Pyrexia 10/01/2014  . Hematuria, undiagnosed cause 09/16/2014  . 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 Macrocytic anemia 08/23/2014  . Hematuria 08/15/2014  . Abdominal pain 08/15/2014  . Hypotension 08/15/2014  . Weakness 08/13/2014  . Fever 08/13/2014  . Constipation 08/13/2014  . Anorexia 08/13/2014  . Dehydration 08/13/2014  . Hypoalbuminemia 08/13/2014  . Transaminitis 08/13/2014  . Hyperbilirubinemia 08/13/2014  . Tobacco abuse, episodic 08/09/2014  . Chemotherapy induced nausea and vomiting 08/09/2014  . Chemotherapy induced neutropenia ([place]) 08/09/2014  . Portacath in place 08/09/2014  . Broken teeth 08/09/2014  . Poor dentition 08/09/2014  . Endometrial cancer ([place]) 08/05/2014  . COPD (chronic obstructive pulmonary disease) ([place]) 02/19/2012  . Macrocytosis without anemia 02/19/2012    Past Surgical History:  Procedure Laterality Date  . CESAREAN SECTION  1991  . CHEST TUBE INSERTION Left 12/22/2015   Procedure: INSERTION PLEURAL DRAINAGE CATHETER;  Surgeon: Ivin Poot, MD;  Location: [facility];  Service: Thoracic;  Laterality: Left;  . COLONOSCOPY    . INCISE AND DRAIN ABCESS  1970   scalp  . PLEURADESIS Left 12/22/2015   Procedure: PLEURADESIS;  Surgeon: Ivin Poot, MD;  Location: [facility];  Service: Thoracic;  Laterality: Left;  . PLEURAL EFFUSION DRAINAGE Left 12/22/2015   Procedure:  DRAINAGE OF PLEURAL EFFUSION;  Surgeon: Ivin Poot, MD;  Location: [facility];  Service: Thoracic;  Laterality: Left;  . PORTACATH PLACEMENT  04/26/2014   rt. power port with tip in SVC  . TOTAL ABDOMINAL HYSTERECTOMY W/ BILATERAL SALPINGOOPHORECTOMY  04/06/14   TAH, BSO, omentectomy, pelvic and right common iliac node evaluation  . VIDEO ASSISTED THORACOSCOPY Left 12/22/2015   Procedure: VIDEO ASSISTED THORACOSCOPY;  Surgeon: Ivin Poot, MD;  Location: [facility] OR;  Service: Thoracic;  Laterality: Left;    OB History    Gravida Para Term Preterm AB Living   3 2 2  0 1 2   SAB TAB Ectopic Multiple Live Births   0 0 0 0         Home Medications    Prior to Admission medications   Medication Sig Start Date End Date Taking? Authorizing Provider  LORazepam (ATIVAN) 0.5 MG tablet Take 1 tablet (0.5 mg total) by mouth 2 (two) times daily as needed for anxiety. 09/11/16  Yes Heath Lark, MD    Family History Family History  Problem Relation Age of Onset  . Hypertension Mother   . Cancer Father   . Colon cancer Neg Hx   . Esophageal cancer Neg Hx   . Rectal cancer Neg Hx   . Stomach cancer Neg Hx     Social History Social History  Substance Use Topics  . Smoking status: Former Smoker    Packs/day: 1.00    Years: 30.00    Types: Cigarettes    Quit date: 07/16/2013  . Smokeless tobacco: Never Used  . Alcohol use 1.2 - 1.8 oz/week    2 - 3 Glasses of wine per week     Comment: none now     Allergies   Carboplatin and Levofloxacin   Review of Systems Review of Systems  UNABLE TO OBTAIN ROS DUE TO LEVEL 5 CAVEAT   Physical Exam Updated Vital Signs BP 111/81 (BP Location: Right Arm)   Pulse 98   Temp 99.2 F (37.3 C) (Oral)   Resp 20   SpO2 93%  Vitals reviewed Physical Exam Physical Examination: General appearance -cachectic, responsive to voice, but not answering questions Mental status - somnolent but arousable Eyes - pupils equal and reactive, extraocular eye  movements intact Mouth - mucous membranes moist, pharynx normal without lesions Chest - clear to auscultation, no wheezes, rales or rhonchi, symmetric air entry Heart - normal rate, regular rhythm, normal S1, S2, no murmurs, rubs, clicks or gallops Abdomen - soft, nontender, nondistended, no masses or organomegaly Neurological - somnolent but arousable, moves all extremities Extremities - peripheral pulses normal, no pedal edema, no clubbing or cyanosis Skin - normal coloration and turgor, no rashes  ED Treatments / Results  Labs (all labs ordered are listed, but only abnormal results are displayed) Labs Reviewed  COMPREHENSIVE METABOLIC PANEL - Abnormal; Notable for the following:       Result Value   Glucose, Bld 126 (*)    Total Protein 8.6 (*)    Albumin 2.0 (*)    Alkaline Phosphatase 211 (*)    All other components within normal limits  CBC -  Abnormal; Notable for the following:    WBC 15.6 (*)    RBC 3.20 (*)    Hemoglobin 9.2 (*)    HCT 28.9 (*)    RDW 21.0 (*)    All other components within normal limits  URINALYSIS, ROUTINE W REFLEX MICROSCOPIC - Abnormal; Notable for the following:    Color, Urine AMBER (*)    APPearance HAZY (*)    Protein, ur 100 (*)    All other components within normal limits  CBG MONITORING, ED - Abnormal; Notable for the following:    Glucose-Capillary 127 (*)    All other components within normal limits    EKG  EKG Interpretation None       Radiology Dg Chest 2 View  Result Date: 11/13/2016 CLINICAL DATA:  Altered mental status.  Seizure. EXAM: CHEST  2 VIEW COMPARISON:  Chest CT 10/16/2016 FINDINGS: Left greater than right pulmonary metastases and probable left pleural based metastases. Cardiomegaly and aortic tortuosity partially obscured by tumor. No superimposed edema or air bronchogram. Right IJ porta catheter with tip at the distal SVC. IMPRESSION: Diffuse intrathoracic metastatic disease. No acute superimposed finding.  Electronically Signed   By: Monte Fantasia M.D.   On: 11/13/2016 16:12   Ct Head Wo Contrast  Result Date: 11/13/2016 CLINICAL DATA:  66 y/o F; altered mental status and history of cancer. EXAM: CT HEAD WITHOUT CONTRAST TECHNIQUE: Contiguous axial images were obtained from the base of the skull through the vertex without intravenous contrast. COMPARISON:  None. FINDINGS: Brain: Multiple intracranial foci of mass effect with surrounding vasogenic edema within the frontal lobes bilaterally, left greater than right cerebellum, and right parietal region compatible with multiple intracranial metastasis. Mass effect in the cerebellum partially effaces the fourth ventricle and there is enlargement of lateral and third ventricles disproportional to volume loss compatible with hydrocephalus. No downward herniation. No acute hemorrhage or infarct identified. Vascular: No hyperdense vessel or unexpected calcification. Skull: Midline superior frontal scalp defect. No displaced calvarial fracture. Sinuses/Orbits: No acute finding. Other: None. IMPRESSION: 1. Multiple supratentorial and cerebellar foci of mass of effect with surrounding edema compatible with intracranial metastatic disease. 2. Mass effect with partial effacement of fourth ventricle. 3. Lateral and third ventricular enlargement disproportional to volume loss likely representing mild hydrocephalus. No downward herniation. 4. No acute infarct or hemorrhage identified. These results were called by telephone at the time of interpretation on 11/13/2016 at 4:04 pm to Dr. Alfonzo Beers , who verbally acknowledged these results. Electronically Signed   By: Kristine Garbe M.D.   On: 11/13/2016 16:05    Procedures Procedures (including critical care time)  Medications Ordered in ED Medications  morphine CONCENTRATE 10 MG/0.5ML oral solution 5 mg (5 mg Oral Given 11/14/16 0726)  LORazepam (ATIVAN) injection 2 mg (not administered)  LORazepam (ATIVAN) 2  MG/ML concentrated solution 0.5 mg (0.5 mg Oral Given 11/13/16 2011)  levETIRAcetam (KEPPRA) 100 MG/ML solution 500 mg (500 mg Oral Given 11/13/16 2332)  0.9 %  sodium chloride infusion ( Intravenous New Bag/Given 11/13/16 2230)  acetaminophen (TYLENOL) tablet 650 mg (not administered)    Or  acetaminophen (TYLENOL) suppository 650 mg (not administered)  polyethylene glycol (MIRALAX / GLYCOLAX) packet 17 g (not administered)  ondansetron (ZOFRAN) tablet 4 mg (not administered)    Or  ondansetron (ZOFRAN) injection 4 mg (not administered)  dexamethasone (DECADRON) 1 MG/ML solution 4 mg (not administered)  feeding supplement (BOOST / RESOURCE BREEZE) liquid 1 Container (not administered)  dexamethasone (DECADRON) injection  10 mg (10 mg Intravenous Given 11/13/16 1622)  levETIRAcetam (KEPPRA) 1,000 mg in sodium chloride 0.9 % 100 mL IVPB (0 mg Intravenous Stopped 11/13/16 1706)   ED ECG REPORT   Date: 11/13/2016  Rate: 103  Rhythm: sinus tachycardia  QRS Axis: normal  Intervals: normal  ST/T Wave abnormalities: normal  Conduction Disutrbances:none  Narrative Interpretation:   Old EKG Reviewed: none available  ekg link not available in epic for interpretation into muse  Initial Impression / Assessment and Plan / ED Course  I have reviewed the triage vital signs and the nursing notes.  Pertinent labs & imaging results that were available during my care of the patient were reviewed by me and considered in my medical decision making (see chart for details).  On arrival to the ED patient had a brief generalized tonic clonic seizure- this resolved without treatment.  Vital signs remained stable, pt treated with face mask O2, placed on monitor, IV access obtained.  Pt then transitioned to  O2.  She was postictal for several minutes but within approx 1 hour was more awake and responsive.   Workup revealed new mets to brain with surrounding edema and mass effect.  CXR shows metastatic disease but  no superimposed acute process.  Urine is reassuring.  WBC elevated may be due to just having had seizure.  Hemoglobin is stable/improved compared to recent.  Decadron, keppra ordered.      4:43 PM d/w Dr. Sheran Fava for admission to hospital.  Discussed results with daughter and other family member in room.  They are agreeable with plan for admission.  They state she is DNR.  They are agreeable with decadron and seizure meds and obtaining urine.  D/w family that we would be treating reversible things and treating issues that would help her to be more comfortable. She is awake/sleepy and appears comfortable at this time.  I have advised them that I have made consult to oncology and have not spoken with them yet to update them about her condition.  If I do not speak with them, the admitting team will be, and they will be making palliative care consult.     Final Clinical Impressions(s) / ED Diagnoses   Final diagnoses:  Metastatic cancer ([place])  Seizure ([place])  Altered mental status, unspecified altered mental status type    New Prescriptions Current Discharge Medication List       Alfonzo Beers, MD 11/14/16 1040

## 2016-11-14 ENCOUNTER — Telehealth: Payer: Self-pay | Admitting: Hematology and Oncology

## 2016-11-14 DIAGNOSIS — K5900 Constipation, unspecified: Secondary | ICD-10-CM | POA: Diagnosis present

## 2016-11-14 DIAGNOSIS — Z66 Do not resuscitate: Secondary | ICD-10-CM | POA: Diagnosis present

## 2016-11-14 DIAGNOSIS — G936 Cerebral edema: Secondary | ICD-10-CM | POA: Diagnosis not present

## 2016-11-14 DIAGNOSIS — Z8542 Personal history of malignant neoplasm of other parts of uterus: Secondary | ICD-10-CM | POA: Diagnosis not present

## 2016-11-14 DIAGNOSIS — R569 Unspecified convulsions: Secondary | ICD-10-CM | POA: Diagnosis present

## 2016-11-14 DIAGNOSIS — Z6821 Body mass index (BMI) 21.0-21.9, adult: Secondary | ICD-10-CM | POA: Diagnosis not present

## 2016-11-14 DIAGNOSIS — Z9221 Personal history of antineoplastic chemotherapy: Secondary | ICD-10-CM | POA: Diagnosis not present

## 2016-11-14 DIAGNOSIS — J449 Chronic obstructive pulmonary disease, unspecified: Secondary | ICD-10-CM | POA: Diagnosis present

## 2016-11-14 DIAGNOSIS — C7931 Secondary malignant neoplasm of brain: Secondary | ICD-10-CM | POA: Diagnosis not present

## 2016-11-14 DIAGNOSIS — R531 Weakness: Secondary | ICD-10-CM | POA: Diagnosis present

## 2016-11-14 DIAGNOSIS — Z90710 Acquired absence of both cervix and uterus: Secondary | ICD-10-CM | POA: Diagnosis not present

## 2016-11-14 DIAGNOSIS — G40409 Other generalized epilepsy and epileptic syndromes, not intractable, without status epilepticus: Secondary | ICD-10-CM | POA: Diagnosis present

## 2016-11-14 DIAGNOSIS — C7800 Secondary malignant neoplasm of unspecified lung: Secondary | ICD-10-CM | POA: Diagnosis present

## 2016-11-14 DIAGNOSIS — R159 Full incontinence of feces: Secondary | ICD-10-CM | POA: Diagnosis present

## 2016-11-14 DIAGNOSIS — D638 Anemia in other chronic diseases classified elsewhere: Secondary | ICD-10-CM | POA: Diagnosis present

## 2016-11-14 DIAGNOSIS — C799 Secondary malignant neoplasm of unspecified site: Secondary | ICD-10-CM | POA: Diagnosis not present

## 2016-11-14 DIAGNOSIS — R4182 Altered mental status, unspecified: Secondary | ICD-10-CM | POA: Diagnosis not present

## 2016-11-14 DIAGNOSIS — M1990 Unspecified osteoarthritis, unspecified site: Secondary | ICD-10-CM | POA: Diagnosis present

## 2016-11-14 DIAGNOSIS — G893 Neoplasm related pain (acute) (chronic): Secondary | ICD-10-CM | POA: Diagnosis present

## 2016-11-14 DIAGNOSIS — Z888 Allergy status to other drugs, medicaments and biological substances status: Secondary | ICD-10-CM | POA: Diagnosis not present

## 2016-11-14 DIAGNOSIS — Z87891 Personal history of nicotine dependence: Secondary | ICD-10-CM | POA: Diagnosis not present

## 2016-11-14 DIAGNOSIS — D72829 Elevated white blood cell count, unspecified: Secondary | ICD-10-CM | POA: Diagnosis present

## 2016-11-14 DIAGNOSIS — C541 Malignant neoplasm of endometrium: Secondary | ICD-10-CM | POA: Diagnosis not present

## 2016-11-14 DIAGNOSIS — C787 Secondary malignant neoplasm of liver and intrahepatic bile duct: Secondary | ICD-10-CM | POA: Diagnosis present

## 2016-11-14 DIAGNOSIS — R197 Diarrhea, unspecified: Secondary | ICD-10-CM | POA: Diagnosis present

## 2016-11-14 DIAGNOSIS — Z515 Encounter for palliative care: Secondary | ICD-10-CM | POA: Diagnosis present

## 2016-11-14 DIAGNOSIS — R64 Cachexia: Secondary | ICD-10-CM | POA: Diagnosis present

## 2016-11-14 DIAGNOSIS — G919 Hydrocephalus, unspecified: Secondary | ICD-10-CM | POA: Diagnosis present

## 2016-11-14 MED ORDER — BOOST / RESOURCE BREEZE PO LIQD
1.0000 | Freq: Three times a day (TID) | ORAL | Status: DC
  Administered 2016-11-14 – 2016-11-15 (×2): 1 via ORAL

## 2016-11-14 NOTE — Progress Notes (Signed)
[id number] Charese Abundis,BSN,RN3,CCM/[phone] TCT-Stacy Delice Lesch with [employer] informed for the need of the hospital bed and overbed table/Sister is contact-telephone number left on voice mail.

## 2016-11-14 NOTE — Progress Notes (Signed)
Date:  Nov 14, 2016  Chart reviewed for concurrent status and case management needs.  Will continue to follow patient progress.  Discharge Planning: Sheets with hospice home providers and note to call me at [phone] with choice or questions left at bedside.  Patient alseep and does not want to be disturbed.  Expected discharge date: [id number]  Velva Harman, [place], [place], [place]

## 2016-11-14 NOTE — Telephone Encounter (Signed)
I spoke with the hospitalist I was informed that the patient is being admitted and imaging study showed diffuse brain metastases I agree with the plan for the patient to transition care to hospice I have discontinued all appointments at the cancer center Estimated prognosis is likely going to be less than 2 weeks

## 2016-11-14 NOTE — Progress Notes (Signed)
Nutrition Brief Note  Chart reviewed. Per MD note, poor prognosis, pt and family would like to focus on comfort at this time. Nutrition interventions not appropriate at this time.  Please re-consult as needed.   Koleen Distance MS, RD, LDN Pager #- [phone]

## 2016-11-14 NOTE — Progress Notes (Signed)
PROGRESS NOTE  Sheena Caldwell WER:[id number] DOB: 05/28/1951 DOA: 11/13/2016 PCP: Jacquelyne Balint, MD   LOS: 0 days   Brief Narrative / Interim history: Sheena Caldwell is a 66 y.o. 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 During her last visit with her oncologist a few weeks ago, the patient's oncologist recommended palliative care and hospice care however the patient was not ready at that time. Since that visit, she has had progressive weakness and for the last 3-4 days has been unable to get out of bed. She is also become more confused.  She is complaining about a headache for the last 2-3 days, worse with movement.  She was admitted to the hospital on 5/30 with several seizure episodes.  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  Very poor prognosis, patient was started on Keppra as well as dexamethasone, no further seizures. -Case manager consulted for hospice at home, per family they prefer to take patient at home. -If she is seizure free until tomorrow, may be able to go home -Discussed with Dr. Alvy Bimler over the phone as well  Metastatic uterine cancer to lung, liver, and brain with worsening pain -Home with hospice, continue morphine  Leukocytosis -No evidence of infection, likely reactive  Goals of care -Discussed with patient's sister at bedside, aim for her to go back home tomorrow   DVT prophylaxis: SCD Code Status: DNR Family Communication: family bedside Disposition Plan: home with hospice 6/1  Consultants:   None   Procedures:   None    Antimicrobials:  None    Subjective: -no complaints, pleasant, alert appears slightly confused   Objective: Vitals:   11/13/16 2030 11/13/16 2133 11/14/16 0515 11/14/16 1100  BP: 111/87 107/85 111/81 106/81  Pulse: 94 95 98 98  Resp: (!) 25 20 20    Temp:  97.5 F (36.4 C) 99.2 F (37.3 C) (!) 96.8 F (36 C)  TempSrc:  Oral Oral Oral  SpO2: 97% 93% 93% 98%    Intake/Output Summary (Last 24 hours) at 11/14/16 1311 Last data filed at 11/14/16 0556  Gross per 24 hour  Intake              100 ml  Output              175 ml  Net              -75 ml   There were no vitals filed for this visit.  Examination:  Vitals:   11/13/16 2030 11/13/16 2133 11/14/16 0515 11/14/16 1100  BP: 111/87 107/85 111/81 106/81  Pulse: 94 95 98 98  Resp: (!) 25 20 20    Temp:  97.5 F (36.4 C) 99.2 F (37.3 C) (!) 96.8 F (36 C)  TempSrc:  Oral Oral Oral  SpO2: 97% 93% 93% 98%    Constitutional: NAD Eyes: PERRL, lids and conjunctivae normal ENMT: Mucous membranes are dry  Neck: normal, supple, no masses, no thyromegaly Respiratory: clear to auscultation bilaterally, no wheezing, no crackles. Cardiovascular: Regular rate and rhythm, no murmurs / rubs / gallops.  Abdomen: no tenderness. Bowel sounds positive.  Skin: no rashes,  lesions, ulcers. No induration Neurologic: generalized weakness, moves all 4   Data Reviewed: I have personally reviewed following labs and imaging studies  CBC:  Recent Labs Lab 11/12/16 1400 11/13/16 1434  WBC 9.8 15.6*  NEUTROABS 7.7*  --   HGB 8.4* 9.2*  HCT 27.3* 28.9*  MCV 91.3 90.3  PLT 313 240   Basic Metabolic Panel:  Recent Labs Lab 11/12/16 1400 11/13/16 1434  NA 142 143  K 3.7 4.0  CL  --  109  CO2 22 23  GLUCOSE 185* 126*  BUN 14.5 14  CREATININE 0.7 0.59  CALCIUM 9.5 9.2   GFR: Estimated Creatinine Clearance: 67.4 mL/min (by C-G formula based on SCr of 0.59 mg/dL). Liver Function Tests:  Recent Labs Lab 11/12/16 1400  11/13/16 1434  AST 28 28  ALT 19 20  ALKPHOS 239* 211*  BILITOT 0.49 0.5  PROT 7.9 8.6*  ALBUMIN 1.5* 2.0*   No results for input(s): LIPASE, AMYLASE in the last 168 hours. No results for input(s): AMMONIA in the last 168 hours. Coagulation Profile: No results for input(s): INR, PROTIME in the last 168 hours. Cardiac Enzymes: No results for input(s): CKTOTAL, CKMB, CKMBINDEX, TROPONINI in the last 168 hours. BNP (last 3 results) No results for input(s): PROBNP in the last 8760 hours. HbA1C: No results for input(s): HGBA1C in the last 72 hours. CBG:  Recent Labs Lab 11/13/16 1443  GLUCAP 127*   Lipid Profile: No results for input(s): CHOL, HDL, LDLCALC, TRIG, CHOLHDL, LDLDIRECT in the last 72 hours. Thyroid Function Tests: No results for input(s): TSH, T4TOTAL, FREET4, T3FREE, THYROIDAB in the last 72 hours. Anemia Panel: No results for input(s): VITAMINB12, FOLATE, FERRITIN, TIBC, IRON, RETICCTPCT in the last 72 hours. Urine analysis:    Component Value Date/Time   COLORURINE AMBER (A) 11/13/2016 1439   APPEARANCEUR HAZY (A) 11/13/2016 1439   LABSPEC 1.025 11/13/2016 1439   LABSPEC 1.010 09/07/2015 0918   PHURINE 6.0 11/13/2016 1439   GLUCOSEU NEGATIVE 11/13/2016 1439   GLUCOSEU Negative 09/07/2015 0918   HGBUR NEGATIVE 11/13/2016 1439   BILIRUBINUR NEGATIVE 11/13/2016 1439   BILIRUBINUR Negative 09/07/2015 0918   KETONESUR NEGATIVE 11/13/2016 1439   PROTEINUR 100 (A) 11/13/2016 1439   UROBILINOGEN 0.2 09/07/2015 0918   NITRITE NEGATIVE 11/13/2016 1439   LEUKOCYTESUR NEGATIVE 11/13/2016 1439   LEUKOCYTESUR Negative 09/07/2015 0918   Sepsis Labs: Invalid input(s): PROCALCITONIN, LACTICIDVEN  No results found for this or any previous visit (from the past 240 hour(s)).    Radiology Studies: Dg Chest 2 View  Result Date: 11/13/2016 CLINICAL DATA:  Altered mental status.  Seizure. EXAM: CHEST  2 VIEW COMPARISON:  Chest CT 10/16/2016 FINDINGS: Left greater than  right pulmonary metastases and probable left pleural based metastases. Cardiomegaly and aortic tortuosity partially obscured by tumor. No superimposed edema or air bronchogram. Right IJ porta catheter with tip at the distal SVC. IMPRESSION: Diffuse intrathoracic metastatic disease. No acute superimposed finding. Electronically Signed   By: Monte Fantasia M.D.   On: 11/13/2016 16:12   Ct Head Wo Contrast  Result Date: 11/13/2016 CLINICAL DATA:  66 y/o F; altered mental status and history of cancer. EXAM: CT HEAD WITHOUT CONTRAST TECHNIQUE: Contiguous axial images were obtained from the base of the skull through the vertex without intravenous contrast. COMPARISON:  None. FINDINGS: Brain: Multiple intracranial foci of mass effect with surrounding vasogenic edema within the frontal lobes bilaterally, left greater than right cerebellum, and right parietal region compatible with multiple intracranial metastasis.  Mass effect in the cerebellum partially effaces the fourth ventricle and there is enlargement of lateral and third ventricles disproportional to volume loss compatible with hydrocephalus. No downward herniation. No acute hemorrhage or infarct identified. Vascular: No hyperdense vessel or unexpected calcification. Skull: Midline superior frontal scalp defect. No displaced calvarial fracture. Sinuses/Orbits: No acute finding. Other: None. IMPRESSION: 1. Multiple supratentorial and cerebellar foci of mass of effect with surrounding edema compatible with intracranial metastatic disease. 2. Mass effect with partial effacement of fourth ventricle. 3. Lateral and third ventricular enlargement disproportional to volume loss likely representing mild hydrocephalus. No downward herniation. 4. No acute infarct or hemorrhage identified. These results were called by telephone at the time of interpretation on 11/13/2016 at 4:04 pm to Dr. Alfonzo Beers , who verbally acknowledged these results. Electronically Signed   By:  Kristine Garbe M.D.   On: 11/13/2016 16:05     Scheduled Meds: . dexamethasone  4 mg Oral Daily  . feeding supplement  1 Container Oral TID BM  . levETIRAcetam  500 mg Oral BID   Continuous Infusions:   Marzetta Board, MD, PhD [facility] Pager [phone]  If 7PM-7AM, please contact night-coverage [email or web address] Password [email or web address] 11/14/2016, 1:11 PM

## 2016-11-14 NOTE — Care Management Note (Addendum)
Case Management Note  Patient Details  Name: Sheena Caldwell MRN: [id number] Date of Birth: 1951-05-31  Subjective/Objective:                  65 y.o. 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 During her last visit with her oncologist a few weeks ago, the patient's oncologist recommended palliative care and hospice care however the patient was not ready at that time. Since that visit, she has had progressive weakness and for the last 3-4 days has been unable to get out of bed. She is also become more confused.  She is complaining about a headache for the last 2-3 days, worse with movement.  Last night, she was given a dose of ultram for her headache.  Overnight, she was agitated and she had an episode of stool incontinence. The family witnessed full body jerking today and brought her to the ER for evaluation.  She has not had any fevers, chills, sinus congestion. She has been coughingmore. The patient denies pains other than headache for the last 2 days. She had one loose stool overnight but has not had watery diarrhea since. She has not been on any antibiotics in the last few months. She takes MiraLAX daily to prevent constipation in her stools have been softer than before because of the boost that she has been drinking.    ED Course:  Vital signs were notable for mildly elevated heart rate in the 90s to low 100s. Her white blood cell count had gone up to 15.6 and was 9.8 on 5/29. She takes Florinef and Megace. Her hemoglobin and BMP were at her baseline.  Her chest x-ray demonstrated diffuse intrathoracic metastatic disease but no acute superimposed infection.  Her head CT demonstrated multiple metastatic lesions with surrounding edema and mass effect with partial effacement of the fourth ventricle. There was lateral and third  ventricular enlargement concerning for mild hydrocephalus but no downward herniation.  The patient was given Decadron 10 mg IV once and loaded with Keppra 1000 mg once. She had 1 recurrent seizure in the emergency department as the nurse was trying to obtain an in and out catheterization.    Action/Plan: Date:  Nov 14, 2016  Chart reviewed for concurrent status and case management needs.  Will continue to follow patient progress.  Discharge Planning: Martin Majestic to give home hospice providers list for [place]. Patient asleep no family in room.   Will try again later today. Expected discharge date: [id number]  Velva Harman, BSN, [place], [place]   Expected Discharge Date:   (unknown)               Expected Discharge Plan:  Home/Self Care  In-House Referral:     Discharge planning Services  CM Consult  Post Acute Care Choice:    Choice offered to:     DME Arranged:    DME Agency:     HH Arranged:    HH Agency:     Status of Service:  In process, will continue to follow  If discussed at Long Length of Stay Meetings, dates discussed:    Additional Comments:  Leeroy Cha, RN 11/14/2016, 10:13 AM

## 2016-11-15 ENCOUNTER — Ambulatory Visit: Payer: Medicare Other

## 2016-11-15 MED ORDER — MORPHINE SULFATE (CONCENTRATE) 10 MG/0.5ML PO SOLN: 5.0000 mg | ORAL | 0 refills | Status: AC | PRN

## 2016-11-15 MED ORDER — LEVETIRACETAM 100 MG/ML PO SOLN: 500.0000 mg | Freq: Two times a day (BID) | ORAL | 1 refills | Status: AC

## 2016-11-15 MED ORDER — DEXAMETHASONE 1 MG/ML PO CONC: 4.0000 mg | Freq: Every day | ORAL | 1 refills | Status: AC

## 2016-11-15 NOTE — Progress Notes (Signed)
[place], CCM:  [facility] called for ambulance transport the hospital bed is at the home at this time per the [facility].

## 2016-11-15 NOTE — Progress Notes (Signed)
[id number] with [facility]/notified verbally about pending discharge and patient home needs of a hospital bed and overbed table./Ario Mcdiarmid,BSWN,RN3,CCM/[phone]

## 2016-11-15 NOTE — Discharge Summary (Signed)
Physician Discharge Summary  Sheena Caldwell VQM:[id number] DOB: 03/02/1951 DOA: 11/13/2016  PCP: Jacquelyne Balint, MD  Admit date: 11/13/2016 Discharge date: 11/15/2016  Admitted From: home Disposition:  Home with hospice  Recommendations for Outpatient Follow-up:  1. Follow up with hospice services  Home Health: hospice Equipment/Devices: none  Discharge Condition: guarded CODE STATUS: DNR Diet recommendation: as tolerated   HPI: Per Dr. Sheran Fava, Sheena Caldwell is a 66 y.o. 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 During her last visit with her oncologist a few weeks ago, the patient's oncologist recommended palliative care and hospice care however the patient was not ready at that time. Since that visit, she has had progressive weakness and for the last 3-4 days has been unable to get out of bed. She is also become more confused.  She is complaining about a headache for the last 2-3 days, worse with movement.  Last night, she was given a dose of ultram for her headache.  Overnight, she was agitated and she had an episode of stool incontinence. The family witnessed full body jerking today and brought her to the ER for evaluation.  She has not had any fevers, chills, sinus congestion. She has been coughingmore. The patient denies pains other than headache for the last 2 days. She had one loose stool overnight but has not had watery diarrhea since. She has not been on any antibiotics in the last few months. She takes MiraLAX daily to prevent constipation in her stools have been softer than before because of the boost that she has been drinking. ED Course:  Vital signs were notable for mildly elevated heart rate in the 90s to low 100s. Her white blood cell count had gone up to 15.6 and was 9.8 on 5/29. She takes Florinef  and Megace. Her hemoglobin and BMP were at her baseline.  Her chest x-ray demonstrated diffuse intrathoracic metastatic disease but no acute superimposed infection.  Her head CT demonstrated multiple metastatic lesions with surrounding edema and mass effect with partial effacement of the fourth ventricle. There was lateral and third ventricular enlargement concerning for mild hydrocephalus but no downward herniation.  The patient was given Decadron 10 mg IV once and loaded with Keppra 1000 mg once. She had 1 recurrent seizure in the emergency department as the nurse was trying to obtain an in and out catheterization.    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  Patient has a very poor prognosis, understood by family, and they would like to focus on comfort.  Patient was started on Keppra as well as dexamethasone, she seems to be tolerating these medications well, she had no further seizures while hospitalized.  Goal of care is towards comfort without aggressive measures, discussed with patient's primary oncologist Dr. Alvy Bimler as well who is in full treatment.  Case manager was consulted and home hospice was set up on discharge. Metastatic uterine cancer to lung, liver, and brainwith worsening pain -Home with hospice, continue morphine Leukocytosis -No evidence of  infection, likely reactive Goals of care -Discussed with patient's family at bedside, plan for patient to be discharged home with hospice   Discharge Instructions   Allergies as of 11/15/2016      Reactions   Carboplatin Other (See Comments)   Skin test  reaction on 05/16/16.     Levofloxacin Other (See Comments)   Pt states that this medication just made her feel horrible.        Medication List    TAKE these medications   dexamethasone 1 MG/ML solution Commonly known as:  DECADRON Take 4 mLs (4 mg total) by mouth daily. Start taking on:  11/16/2016   levETIRAcetam 100 MG/ML solution Commonly known as:  KEPPRA Take 5 mLs (500 mg total) by mouth 2 (two) times daily.   LORazepam 0.5 MG tablet Commonly known as:  ATIVAN Take 1 tablet (0.5 mg total) by mouth 2 (two) times daily as needed for anxiety.   morphine CONCENTRATE 10 MG/0.5ML Soln concentrated solution Take 0.25 mLs (5 mg total) by mouth every hour as needed for moderate pain, severe pain or shortness of breath.            Durable Medical Equipment        Start     Ordered   11/15/16 0854  For home use only DME Hospital bed  Once    Question:  Bed type  Answer:  Semi-electric   11/15/16 0853     Follow-up Information    Jacquelyne Balint, MD Follow up.   Specialty:  Gynecologic Oncology Why:  as needed Contact information: [address] [place] [address] [phone]          Allergies  Allergen Reactions  . Carboplatin Other (See Comments)    Skin test reaction on 05/16/16.    . Levofloxacin Other (See Comments)    Pt states that this medication just made her feel horrible.      Consultations:  None   Procedures/Studies:  Dg Chest 2 View  Result Date: 11/13/2016 CLINICAL DATA:  Altered mental status.  Seizure. EXAM: CHEST  2 VIEW COMPARISON:  Chest CT 10/16/2016 FINDINGS: Left greater than right pulmonary metastases and probable left pleural based metastases. Cardiomegaly and aortic tortuosity partially obscured by tumor. No superimposed edema or air bronchogram. Right IJ porta catheter with tip at the distal SVC. IMPRESSION: Diffuse intrathoracic metastatic disease. No acute superimposed finding. Electronically Signed    By: Monte Fantasia M.D.   On: 11/13/2016 16:12   Ct Head Wo Contrast  Result Date: 11/13/2016 CLINICAL DATA:  66 y/o F; altered mental status and history of cancer. EXAM: CT HEAD WITHOUT CONTRAST TECHNIQUE: Contiguous axial images were obtained from the base of the skull through the vertex without intravenous contrast. COMPARISON:  None. FINDINGS: Brain: Multiple intracranial foci of mass effect with surrounding vasogenic edema within the frontal lobes bilaterally, left greater than right cerebellum, and right parietal region compatible with multiple intracranial metastasis. Mass effect in the cerebellum partially effaces the fourth ventricle and there is enlargement of lateral and third ventricles disproportional to volume loss compatible with hydrocephalus. No downward herniation. No acute hemorrhage or infarct identified. Vascular: No hyperdense vessel or unexpected calcification. Skull: Midline superior frontal scalp defect. No displaced calvarial fracture. Sinuses/Orbits: No acute finding. Other: None. IMPRESSION: 1. Multiple supratentorial and cerebellar foci of mass of effect with surrounding edema compatible with intracranial metastatic disease. 2. Mass effect with partial effacement of fourth ventricle. 3. Lateral and  third ventricular enlargement disproportional to volume loss likely representing mild hydrocephalus. No downward herniation. 4. No acute infarct or hemorrhage identified. These results were called by telephone at the time of interpretation on 11/13/2016 at 4:04 pm to Dr. Alfonzo Beers , who verbally acknowledged these results. Electronically Signed   By: Kristine Garbe M.D.   On: 11/13/2016 16:05   Ct Chest W Contrast  Result Date: 10/16/2016 CLINICAL DATA:  Followup metastatic endometrial carcinoma. Increased shortness of breath. Currently undergoing chemotherapy. Previous surgery and radiation therapy. EXAM: CT CHEST, ABDOMEN, AND PELVIS WITH CONTRAST TECHNIQUE:  Multidetector CT imaging of the chest, abdomen and pelvis was performed following the standard protocol during bolus administration of intravenous contrast. CONTRAST:  140mL ISOVUE-300 IOPAMIDOL (ISOVUE-300) INJECTION 61% COMPARISON:  09/02/2016 FINDINGS: CT CHEST FINDINGS Cardiovascular: No acute findings. Stable 4.0 cm ascending thoracic aortic aneurysm. Mediastinum/Lymph Nodes: Mild increase in mediastinal lymphadenopathy since prior study. Index lesion in the lateral aortic region measures 2.7 x 4.4 cm on image 25/ 2 compared to 2.5 x 3.7 cm previously. Index adenopathy in the subcarinal region measures 2.8 cm on image 38/ 2 compared to 2.2 cm previously. Mild bilateral hilar lymphadenopathy shows no significant change. No evidence of axillary or supraclavicular lymphadenopathy. Lungs/Pleura: Moderate emphysema again demonstrated. Bilateral pulmonary metastases involving left lung greater than right show interval increase since previous study. Index nodule in posterior right lower lobe currently measures 2.7 x 2.4 cm on image 119/4 compared to 2.4 x 1.9 cm previously. Index nodule in the superior left lower lobe abutting the major fissure measures 3.7 x 2.8 cm on image 51/4 compared to 2.4 x 2.1 cm previously. Tiny left pleural effusion is decreased in size since prior exam. Musculoskeletal:  No suspicious bone lesions identified. CT ABDOMEN AND PELVIS FINDINGS Hepatobiliary: No masses identified. Gallbladder is unremarkable. Pancreas:  No mass or inflammatory changes. Spleen:  Within normal limits in size and appearance. Adrenals/Urinary tract: Stable small bilateral renal cysts. No masses or hydronephrosis. Stomach/Bowel: No evidence of obstruction, inflammatory process, or abnormal fluid collections. Vascular/Lymphatic: Mild left retrocrural lymphadenopathy measures 2.2 x 1.7 cm and is not significantly changed since prior study. Mild left external iliac lymphadenopathy is seen measuring 16 mm on image 99/ 2  compared to 15 mm previously. No new or increased lymphadenopathy within the abdomen or pelvis. No abdominal aortic aneurysm. Aortic atherosclerosis. Reproductive: Prior hysterectomy noted. Adnexal regions are unremarkable in appearance. Other:  None. Musculoskeletal: No suspicious bone lesions identified. Moderate left hip osteoarthritis. IMPRESSION: Mild progression of mediastinal lymphadenopathy in bilateral pulmonary metastases. Stable mild bilateral hilar lymphadenopathy. Stable mild left retrocrural and left external iliac lymphadenopathy. No new or progressive metastatic disease within the abdomen or pelvis. Moderate emphysema. Stable 4.0 cm ascending thoracic aortic aneurysm. Electronically Signed   By: Earle Gell M.D.   On: 10/16/2016 15:35   Ct Abdomen Pelvis W Contrast  Result Date: 10/16/2016 CLINICAL DATA:  Followup metastatic endometrial carcinoma. Increased shortness of breath. Currently undergoing chemotherapy. Previous surgery and radiation therapy. EXAM: CT CHEST, ABDOMEN, AND PELVIS WITH CONTRAST TECHNIQUE: Multidetector CT imaging of the chest, abdomen and pelvis was performed following the standard protocol during bolus administration of intravenous contrast. CONTRAST:  197mL ISOVUE-300 IOPAMIDOL (ISOVUE-300) INJECTION 61% COMPARISON:  09/02/2016 FINDINGS: CT CHEST FINDINGS Cardiovascular: No acute findings. Stable 4.0 cm ascending thoracic aortic aneurysm. Mediastinum/Lymph Nodes: Mild increase in mediastinal lymphadenopathy since prior study. Index lesion in the lateral aortic region measures 2.7 x 4.4 cm on image 25/ 2 compared to  2.5 x 3.7 cm previously. Index adenopathy in the subcarinal region measures 2.8 cm on image 38/ 2 compared to 2.2 cm previously. Mild bilateral hilar lymphadenopathy shows no significant change. No evidence of axillary or supraclavicular lymphadenopathy. Lungs/Pleura: Moderate emphysema again demonstrated. Bilateral pulmonary metastases involving left lung  greater than right show interval increase since previous study. Index nodule in posterior right lower lobe currently measures 2.7 x 2.4 cm on image 119/4 compared to 2.4 x 1.9 cm previously. Index nodule in the superior left lower lobe abutting the major fissure measures 3.7 x 2.8 cm on image 51/4 compared to 2.4 x 2.1 cm previously. Tiny left pleural effusion is decreased in size since prior exam. Musculoskeletal:  No suspicious bone lesions identified. CT ABDOMEN AND PELVIS FINDINGS Hepatobiliary: No masses identified. Gallbladder is unremarkable. Pancreas:  No mass or inflammatory changes. Spleen:  Within normal limits in size and appearance. Adrenals/Urinary tract: Stable small bilateral renal cysts. No masses or hydronephrosis. Stomach/Bowel: No evidence of obstruction, inflammatory process, or abnormal fluid collections. Vascular/Lymphatic: Mild left retrocrural lymphadenopathy measures 2.2 x 1.7 cm and is not significantly changed since prior study. Mild left external iliac lymphadenopathy is seen measuring 16 mm on image 99/ 2 compared to 15 mm previously. No new or increased lymphadenopathy within the abdomen or pelvis. No abdominal aortic aneurysm. Aortic atherosclerosis. Reproductive: Prior hysterectomy noted. Adnexal regions are unremarkable in appearance. Other:  None. Musculoskeletal: No suspicious bone lesions identified. Moderate left hip osteoarthritis. IMPRESSION: Mild progression of mediastinal lymphadenopathy in bilateral pulmonary metastases. Stable mild bilateral hilar lymphadenopathy. Stable mild left retrocrural and left external iliac lymphadenopathy. No new or progressive metastatic disease within the abdomen or pelvis. Moderate emphysema. Stable 4.0 cm ascending thoracic aortic aneurysm. Electronically Signed   By: Earle Gell M.D.   On: 10/16/2016 15:35     Subjective: - no chest pain, shortness of breath, no abdominal pain, nausea or vomiting.   Discharge Exam: Vitals:    11/14/16 2100 11/15/16 0626  BP: 109/78 116/83  Pulse: 94 94  Resp: 18 18  Temp: 98.3 F (36.8 C) 98.8 F (37.1 C)   Vitals:   11/14/16 1100 11/14/16 1400 11/14/16 2100 11/15/16 0626  BP: 106/81 118/87 109/78 116/83  Pulse: 98 (!) 101 94 94  Resp:   18 18  Temp: (!) 96.8 F (36 C) 97.7 F (36.5 C) 98.3 F (36.8 C) 98.8 F (37.1 C)  TempSrc: Oral Oral Oral Other (Comment)  SpO2: 98% 95% 95% 96%    General: Pt is alert, awake, not in acute distress Cardiovascular: RRR, S1/S2 +, no rubs, no gallops Respiratory: CTA bilaterally, no wheezing, no rhonchi Abdominal: Soft, NT, ND, bowel sounds +   The results of significant diagnostics from this hospitalization (including imaging, microbiology, ancillary and laboratory) are listed below for reference.     Microbiology: No results found for this or any previous visit (from the past 240 hour(s)).   Labs: BNP (last 3 results)  Recent Labs  11/30/15 1036  BNP 98.1   Basic Metabolic Panel:  Recent Labs Lab 11/12/16 1400 11/13/16 1434  NA 142 143  K 3.7 4.0  CL  --  109  CO2 22 23  GLUCOSE 185* 126*  BUN 14.5 14  CREATININE 0.7 0.59  CALCIUM 9.5 9.2   Liver Function Tests:  Recent Labs Lab 11/12/16 1400 11/13/16 1434  AST 28 28  ALT 19 20  ALKPHOS 239* 211*  BILITOT 0.49 0.5  PROT 7.9 8.6*  ALBUMIN 1.5*  2.0*   No results for input(s): LIPASE, AMYLASE in the last 168 hours. No results for input(s): AMMONIA in the last 168 hours. CBC:  Recent Labs Lab 11/12/16 1400 11/13/16 1434  WBC 9.8 15.6*  NEUTROABS 7.7*  --   HGB 8.4* 9.2*  HCT 27.3* 28.9*  MCV 91.3 90.3  PLT 313 327   Cardiac Enzymes: No results for input(s): CKTOTAL, CKMB, CKMBINDEX, TROPONINI in the last 168 hours. BNP: Invalid input(s): POCBNP CBG:  Recent Labs Lab 11/13/16 1443  GLUCAP 127*   D-Dimer No results for input(s): DDIMER in the last 72 hours. Hgb A1c No results for input(s): HGBA1C in the last 72 hours. Lipid  Profile No results for input(s): CHOL, HDL, LDLCALC, TRIG, CHOLHDL, LDLDIRECT in the last 72 hours. Thyroid function studies No results for input(s): TSH, T4TOTAL, T3FREE, THYROIDAB in the last 72 hours.  Invalid input(s): FREET3 Anemia work up No results for input(s): VITAMINB12, FOLATE, FERRITIN, TIBC, IRON, RETICCTPCT in the last 72 hours. Urinalysis    Component Value Date/Time   COLORURINE AMBER (A) 11/13/2016 1439   APPEARANCEUR HAZY (A) 11/13/2016 1439   LABSPEC 1.025 11/13/2016 1439   LABSPEC 1.010 09/07/2015 0918   PHURINE 6.0 11/13/2016 1439   GLUCOSEU NEGATIVE 11/13/2016 1439   GLUCOSEU Negative 09/07/2015 0918   HGBUR NEGATIVE 11/13/2016 1439   BILIRUBINUR NEGATIVE 11/13/2016 1439   BILIRUBINUR Negative 09/07/2015 0918   KETONESUR NEGATIVE 11/13/2016 1439   PROTEINUR 100 (A) 11/13/2016 1439   UROBILINOGEN 0.2 09/07/2015 0918   NITRITE NEGATIVE 11/13/2016 1439   LEUKOCYTESUR NEGATIVE 11/13/2016 1439   LEUKOCYTESUR Negative 09/07/2015 0918   Sepsis Labs Invalid input(s): PROCALCITONIN,  WBC,  LACTICIDVEN Microbiology No results found for this or any previous visit (from the past 240 hour(s)).   Time coordinating discharge: 35 minutes  SIGNED:  Marzetta Board, MD  [facility] 11/15/2016, 2:00 PM Pager [phone]  If 7PM-7AM, please contact night-coverage [email or web address] Password [email or web address]

## 2016-11-15 NOTE — Progress Notes (Signed)
Pt being transported by [facility] to home. Hospital bed has been delivered to the pt home. Discharge instructions reviewed with the pt.  Lutie Pickler W Seema Blum, RN

## 2016-11-15 NOTE — Care Management Note (Signed)
durable medical equipment hospital bed

## 2016-11-15 NOTE — Progress Notes (Signed)
Hospice and [place] Visit  Notified by Velva Harman CM RN of patient/family request for [facility] services at home after discharge.  Chart and patient information reviewed with Dr. Alferd Patee [facility] Medical Director and hospice eligibility confirmed.    Spoke with patient daughter, Rashae Rother, at bedside to initiate education related to hospice philosophy, services, and team approach to care. Family questions answered and information provided about going home with hospice.  Per discussion, plan is for discharge to Aunt Fae Pippin) residence via ambulance.   Please send signed completed DNR form home with patient/family. Patient will need prescriptions for discharge comfort medications.   DME needs discussed and patient currently has a BSC, walker and wheelchair in the home.  Family requests the following DME delivery to the home today: Hospital Bed with over the bed tray.  [facility] equipment manager Jewel Ysidro Evert notified and will contact [place] to arrange delivery to the home.  The home address has been verified and is correct in the chart.  Fae Pippin (Aunt) is the family member to be contacted to arrange time of delivery.    [facility] aware of the above. Completed d/c summary will need to be faxed to [facility] at [phone] when final.   Please notify [facility] when pt is ready to leave unit at discharge - call [phone] or [phone] after 5pm). [facility] information and contact numbers have been left in the room during visit. Above information shared with Velva Harman [facility].  Please call with any questions.  Gar Ponto, [facility] Liaison [phone]

## 2016-11-18 ENCOUNTER — Other Ambulatory Visit: Payer: Medicare Other

## 2016-11-18 ENCOUNTER — Other Ambulatory Visit: Payer: Self-pay | Admitting: *Deleted

## 2016-11-18 ENCOUNTER — Ambulatory Visit: Payer: Medicaid Other

## 2016-11-18 ENCOUNTER — Telehealth: Payer: Self-pay | Admitting: *Deleted

## 2016-11-18 MED ORDER — ATROPINE ORAL SOLUTION 0.08 MG/ML: ORAL | 0 refills | Status: AC

## 2016-11-18 NOTE — Telephone Encounter (Signed)
Hospice requesting atropine drops for increased oral secretions. Will send Rx to [facility]

## 2016-11-18 NOTE — Telephone Encounter (Signed)
Sheena Caldwell with Hospice called to see if we

## 2016-11-19 ENCOUNTER — Telehealth: Payer: Self-pay

## 2016-11-19 NOTE — Telephone Encounter (Signed)
Sheena Lower, RN with [facility] called. She is asking for order to increase Ativan and Morphine, she states patient is actively dying. Per Dr. Alvy Bimler, Morphine increased to 10 mg every 1 hours prn and Arivan 0.5 increased to every 4 hours prn.

## 2016-11-20 ENCOUNTER — Encounter: Payer: Self-pay | Admitting: *Deleted

## 2016-11-21 ENCOUNTER — Ambulatory Visit: Payer: Medicaid Other

## 2016-11-21 ENCOUNTER — Ambulatory Visit: Payer: Medicare Other | Admitting: Hematology and Oncology

## 2016-11-25 ENCOUNTER — Telehealth: Payer: Self-pay | Admitting: *Deleted

## 2016-11-25 NOTE — Telephone Encounter (Signed)
OK, will circulate a card 

## 2016-11-25 NOTE — Telephone Encounter (Signed)
"  This is [facility], calling to notify you of this patient's death. Expired 11/23/16 at 7:09 pm."

## 2016-12-15 DEATH — deceased

## 2017-01-29 ENCOUNTER — Other Ambulatory Visit: Payer: Self-pay | Admitting: Nurse Practitioner

## 2017-02-11 IMAGING — CT CT ABD-PELV W/ CM
2 of 5 series · 16 of 46 positions shown, 18 images · IV contrast (OMNIPAQUE)
Comparison: 03/07/2014 CT from [REDACTED]. Chest CT of
03/25/2014 from [REDACTED].

CLINICAL DATA: Endometrial cancer diagnosed [DATE] with chemotherapy
in progress. Weakness. Decreased appetite. Right upper quadrant/
epigastric pain.

EXAM:
CT ABDOMEN AND PELVIS WITH CONTRAST
TECHNIQUE: Multidetector CT imaging of the abdomen and pelvis was performed
using the standard protocol following bolus administration of
intravenous contrast.
CONTRAST:  100mL OMNIPAQUE IOHEXOL 300 MG/ML  SOLN

[Series 2: rtn a/p with · axial · 0.65mm/px · z∈[-500,-150]mm · 13 of 80 slices shown, 15 images]
[im 5/80  soft-tissue]
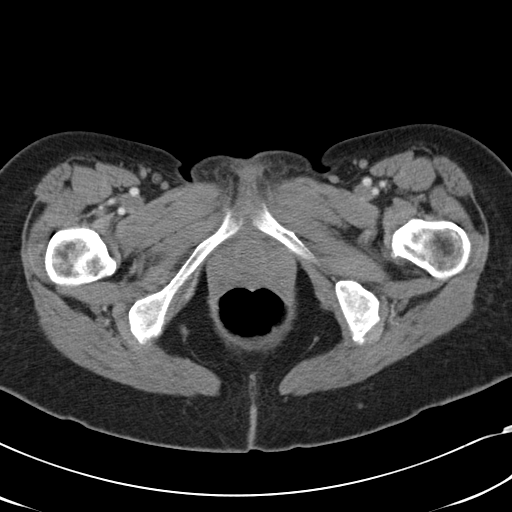
[im 5/80  bone]
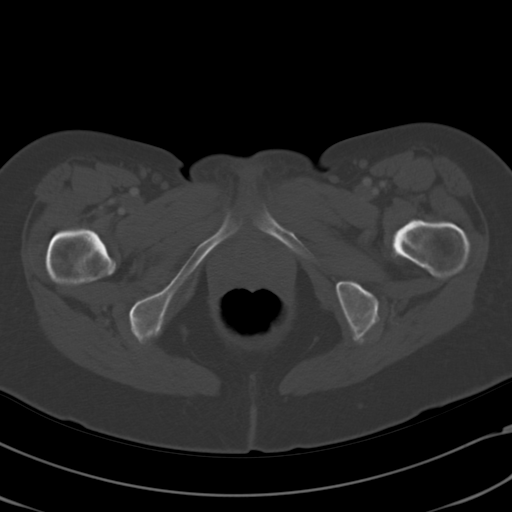
[im 9/80  soft-tissue]
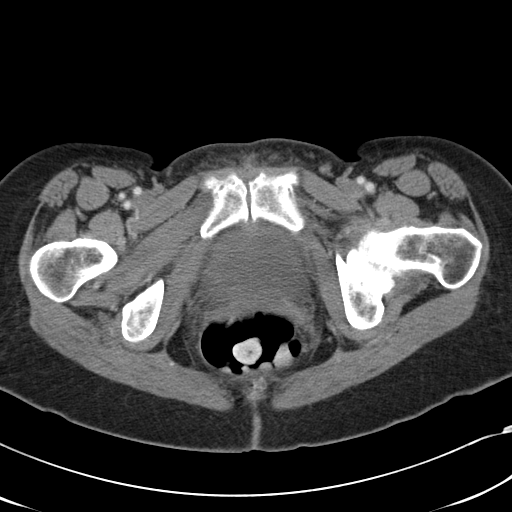
[im 18/80  soft-tissue]
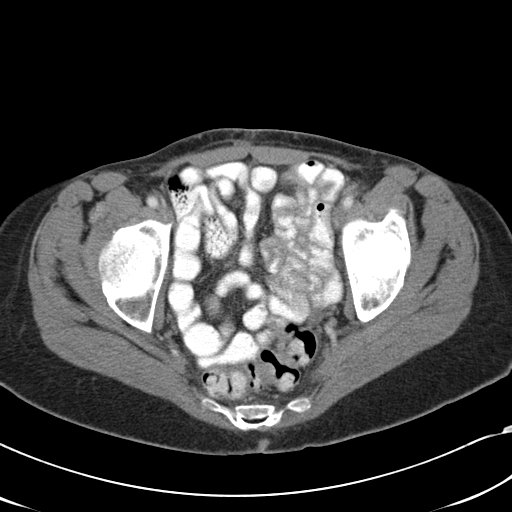
[im 22/80  soft-tissue]
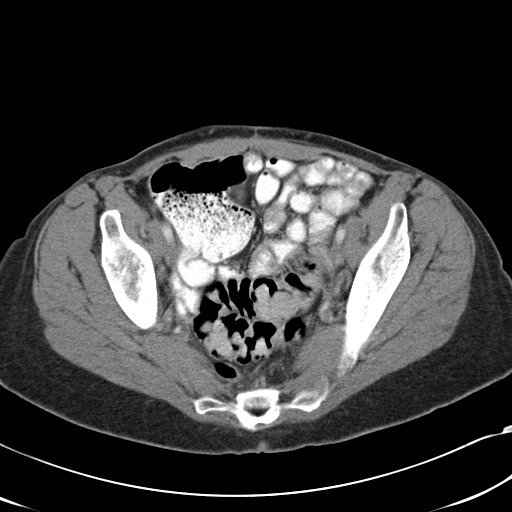
[im 27/80  soft-tissue]
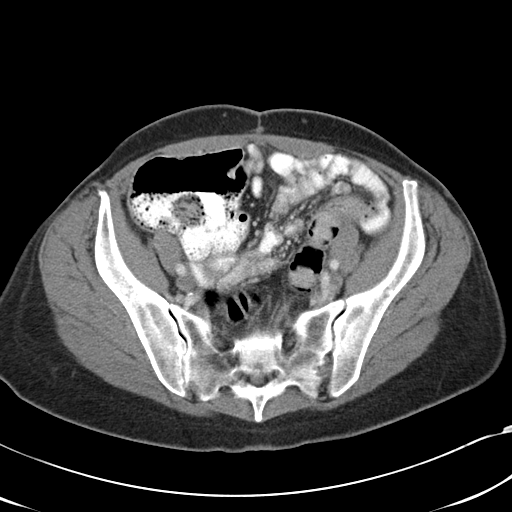
[im 36/80  soft-tissue]
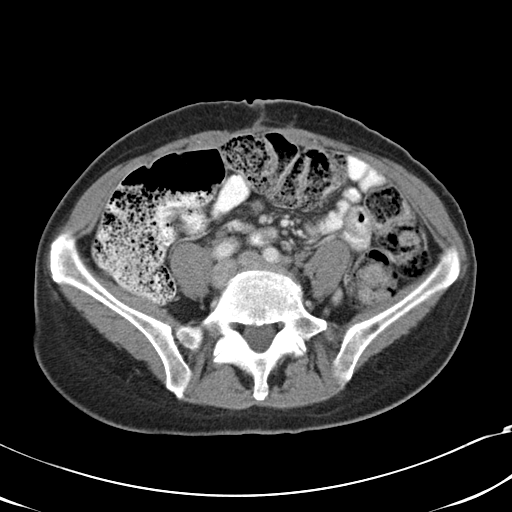
[im 40/80  soft-tissue]
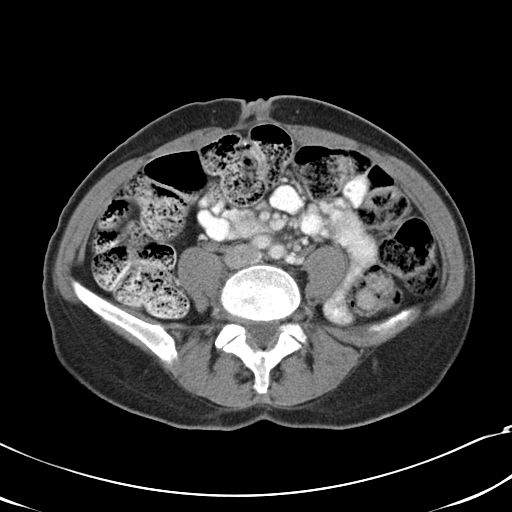
[im 44/80  soft-tissue]
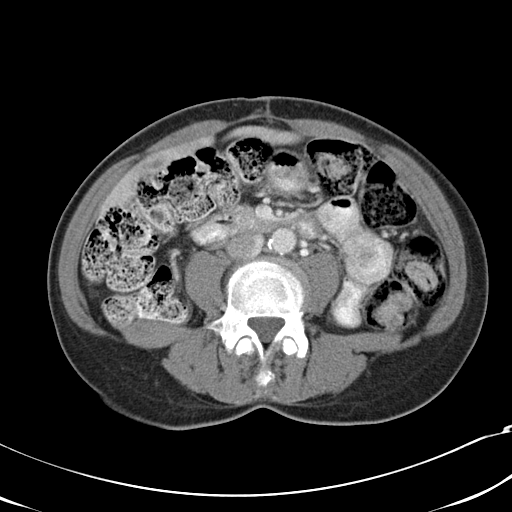
[im 53/80  soft-tissue]
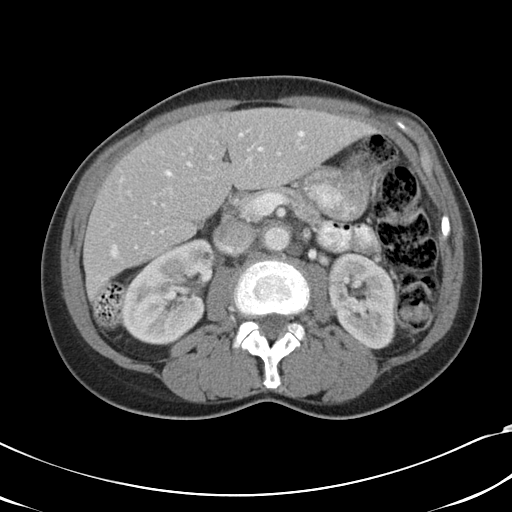
[im 53/80  bone]
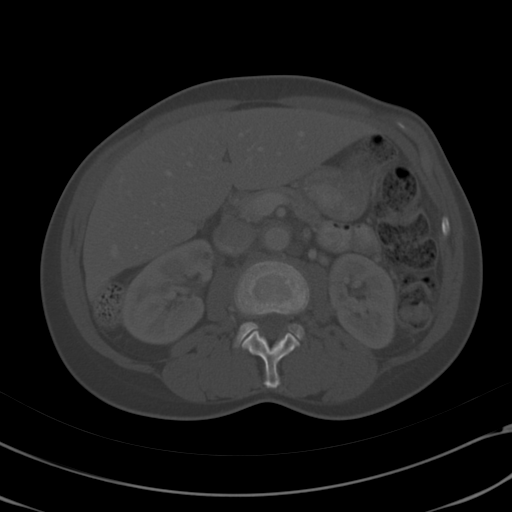
[im 58/80  soft-tissue]
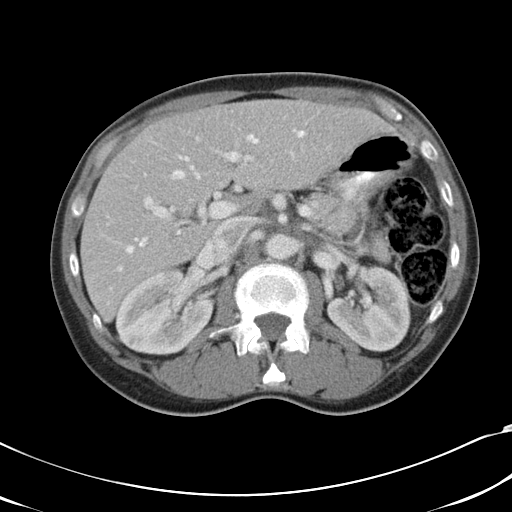
[im 62/80  soft-tissue]
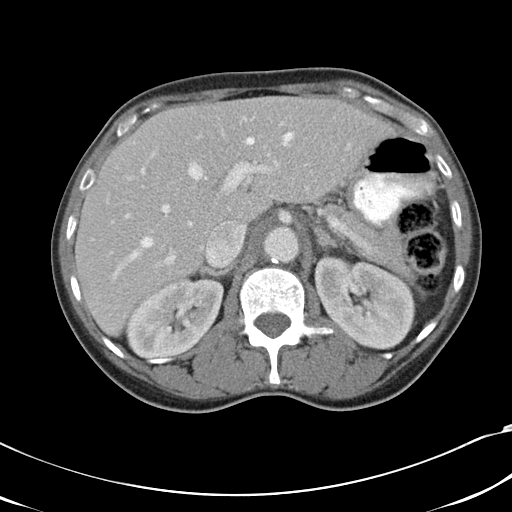
[im 71/80  soft-tissue]
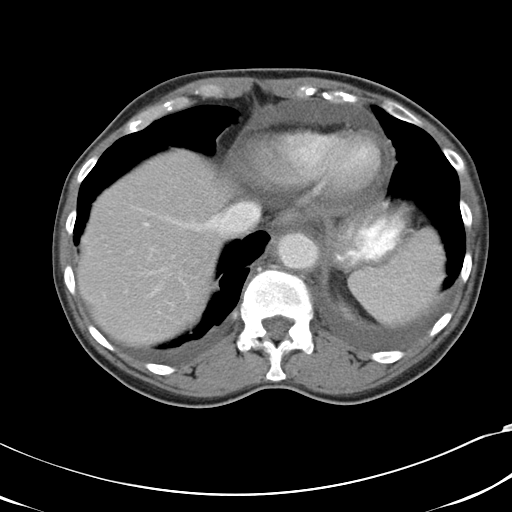
[im 75/80  soft-tissue]
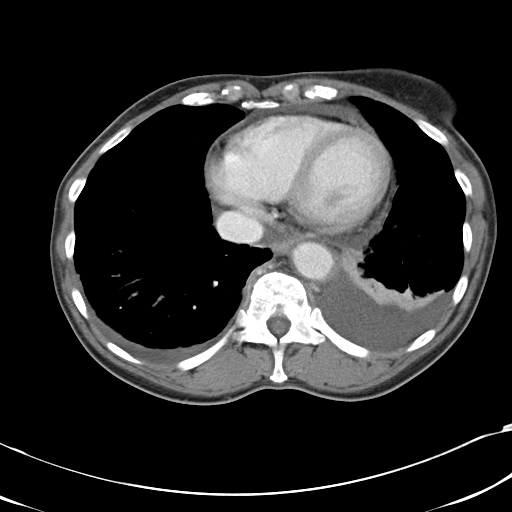

[Series 602: <mpr thick range> · coronal · 0.78mm/px · 3 of 86 slices shown]
[im 29/86  soft-tissue]
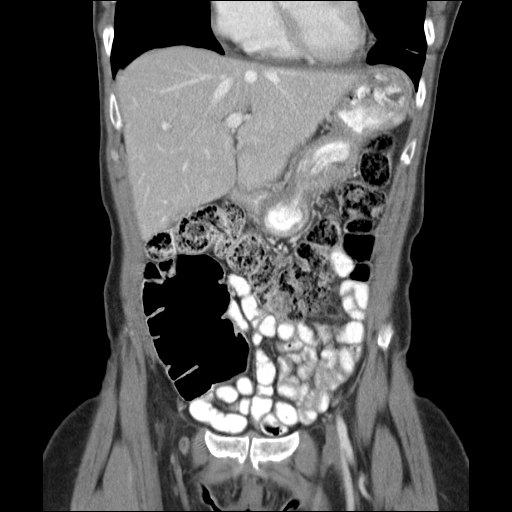
[im 38/86  soft-tissue]
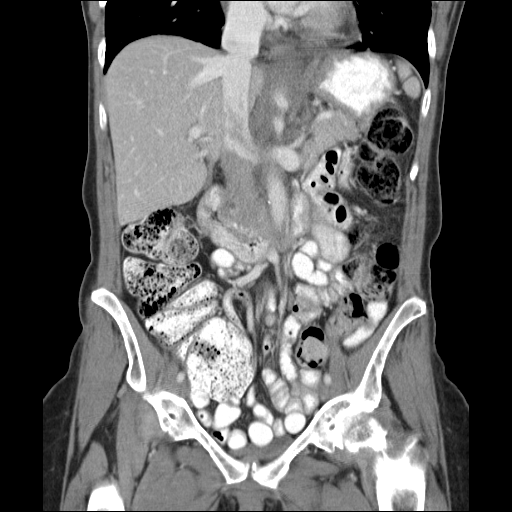
[im 48/86  soft-tissue]
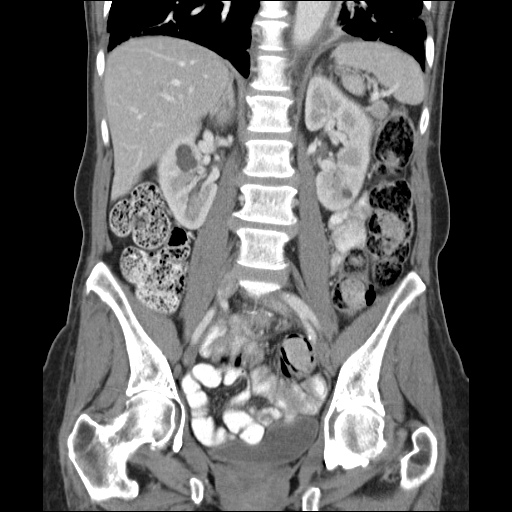

[16 of 46 positions shown; findings below may reference images not displayed]

FINDINGS: Lower chest: 6 mm lingular nodule on image 5 measured 5 mm on the
prior chest CT. Felt to be similar. Left base atelectasis. A 3 mm
right lower lobe pulmonary nodule on image 6 is also felt to be
similar. Mild cardiomegaly with similar small pericardial effusion.
New left greater than right pleural effusions. Small.

Hepatobiliary: Focal steatosis adjacent the falciform ligament.
Gallbladder decompressed. No biliary ductal dilatation.

Pancreas: Normal, without mass or ductal dilatation.

Spleen: Normal

Adrenals/Urinary Tract: Normal adrenal glands. Bilateral renal
cysts. No hydronephrosis. Normal urinary bladder.

Stomach/Bowel: Normal stomach, without wall thickening. Colonic
stool burden suggests constipation. Normal terminal ileum and
appendix. Normal small bowel.

Vascular/Lymphatic: There is soft tissue density surrounding the
aorta at the level of the diaphragmatic hiatus. This is new. Example
image 15 of series 2. Normal caliber of the aorta and branch
vessels.

11 mm left periaortic retroperitoneal low-density structure is
suspicious for a necrotic lymph node. This is decreased from 1.3 cm
on the prior exam (image 26 of that study). No pelvic adenopathy.

Reproductive: Interval hysterectomy.  No adnexal mass.

Other: No significant free fluid. No evidence of omental or
peritoneal disease.

Musculoskeletal: Mild left hip osteoarthritis. Disc bulges at
multiple lumbar levels.
IMPRESSION: 1. Interval hysterectomy.
2. Retroperitoneal low-density structures which are suspicious for
necrotic adenopathy/metastasis. Left periaortic component is
slightly decreased in size. However, there is new soft tissue
thickening about the abdominal aorta more superiorly. Less likely
differential considerations include benign
lymphangioma/lymphangiomas and/or concurrent vasculitis. Consider
PET for further evaluation.
3. Development of bilateral pleural effusions with left base
atelectasis.
4. Indeterminate bibasilar pulmonary nodules, felt to be similar.
Consider further evaluation with chest CT, as recommended on
03/25/2014.
5.  Possible constipation.
